# Patient Record
Sex: Male | Born: 1935
Health system: Southern US, Community
[De-identification: ages and names within clinical notes are randomized; demographics above are authoritative.]

## PROBLEM LIST (undated history)

## (undated) DIAGNOSIS — M1990 Unspecified osteoarthritis, unspecified site: Secondary | ICD-10-CM

## (undated) DIAGNOSIS — J984 Other disorders of lung: Secondary | ICD-10-CM

## (undated) DIAGNOSIS — I10 Essential (primary) hypertension: Secondary | ICD-10-CM

## (undated) DIAGNOSIS — R972 Elevated prostate specific antigen [PSA]: Secondary | ICD-10-CM

## (undated) DIAGNOSIS — I219 Acute myocardial infarction, unspecified: Secondary | ICD-10-CM

## (undated) DIAGNOSIS — I480 Paroxysmal atrial fibrillation: Secondary | ICD-10-CM

## (undated) DIAGNOSIS — I77819 Aortic ectasia, unspecified site: Secondary | ICD-10-CM

## (undated) DIAGNOSIS — Z87442 Personal history of urinary calculi: Secondary | ICD-10-CM

## (undated) DIAGNOSIS — I2510 Atherosclerotic heart disease of native coronary artery without angina pectoris: Secondary | ICD-10-CM

## (undated) DIAGNOSIS — J986 Disorders of diaphragm: Secondary | ICD-10-CM

## (undated) DIAGNOSIS — N210 Calculus in bladder: Secondary | ICD-10-CM

## (undated) DIAGNOSIS — M545 Low back pain, unspecified: Secondary | ICD-10-CM

## (undated) DIAGNOSIS — Z950 Presence of cardiac pacemaker: Secondary | ICD-10-CM

## (undated) DIAGNOSIS — I4729 Other ventricular tachycardia: Secondary | ICD-10-CM

## (undated) DIAGNOSIS — G8929 Other chronic pain: Secondary | ICD-10-CM

## (undated) DIAGNOSIS — I2699 Other pulmonary embolism without acute cor pulmonale: Secondary | ICD-10-CM

## (undated) DIAGNOSIS — Z8739 Personal history of other diseases of the musculoskeletal system and connective tissue: Secondary | ICD-10-CM

## (undated) DIAGNOSIS — Z86711 Personal history of pulmonary embolism: Secondary | ICD-10-CM

## (undated) DIAGNOSIS — N390 Urinary tract infection, site not specified: Secondary | ICD-10-CM

## (undated) DIAGNOSIS — N184 Chronic kidney disease, stage 4 (severe): Secondary | ICD-10-CM

## (undated) DIAGNOSIS — I272 Pulmonary hypertension, unspecified: Secondary | ICD-10-CM

## (undated) DIAGNOSIS — E785 Hyperlipidemia, unspecified: Secondary | ICD-10-CM

## (undated) DIAGNOSIS — I82409 Acute embolism and thrombosis of unspecified deep veins of unspecified lower extremity: Secondary | ICD-10-CM

## (undated) HISTORY — DX: Elevated prostate specific antigen (PSA): R97.20

## (undated) HISTORY — PX: KNEE ARTHROSCOPY: SHX127

## (undated) HISTORY — PX: JOINT REPLACEMENT: SHX530

## (undated) HISTORY — DX: Other pulmonary embolism without acute cor pulmonale: I26.99

## (undated) HISTORY — PX: CYSTOSCOPY W/ STONE MANIPULATION: SHX1427

## (undated) HISTORY — PX: TONSILLECTOMY: SUR1361

## (undated) HISTORY — PX: CATARACT EXTRACTION W/ INTRAOCULAR LENS  IMPLANT, BILATERAL: SHX1307

## (undated) HISTORY — DX: Other disorders of lung: J98.4

## (undated) HISTORY — DX: Essential (primary) hypertension: I10

## (undated) HISTORY — DX: Acute embolism and thrombosis of unspecified deep veins of unspecified lower extremity: I82.409

## (undated) HISTORY — PX: LITHOTRIPSY: SUR834

## (undated) HISTORY — DX: Disorders of diaphragm: J98.6

## (undated) HISTORY — DX: Pulmonary hypertension, unspecified: I27.20

## (undated) HISTORY — DX: Paroxysmal atrial fibrillation: I48.0

## (undated) HISTORY — PX: LAPAROSCOPIC CHOLECYSTECTOMY: SUR755

## (undated) HISTORY — DX: Hyperlipidemia, unspecified: E78.5

## (undated) HISTORY — DX: Other ventricular tachycardia: I47.29

## (undated) HISTORY — DX: Presence of cardiac pacemaker: Z95.0

## (undated) HISTORY — DX: Aortic ectasia, unspecified site: I77.819

---
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
## 1997-12-02 ENCOUNTER — Ambulatory Visit (HOSPITAL_COMMUNITY): Admission: RE | Admit: 1997-12-02 | Discharge: 1997-12-03 | Payer: Self-pay | Admitting: *Deleted

## 2000-03-04 ENCOUNTER — Encounter: Admission: RE | Admit: 2000-03-04 | Discharge: 2000-04-24 | Payer: Self-pay | Admitting: *Deleted

## 2000-07-24 ENCOUNTER — Encounter: Admission: RE | Admit: 2000-07-24 | Discharge: 2000-07-24 | Payer: Self-pay | Admitting: Specialist

## 2000-07-24 ENCOUNTER — Encounter: Payer: Self-pay | Admitting: Specialist

## 2000-08-13 LAB — HM COLONOSCOPY

## 2002-05-06 ENCOUNTER — Encounter
Admission: RE | Admit: 2002-05-06 | Discharge: 2002-05-06 | Payer: Self-pay | Admitting: Physical Medicine & Rehabilitation

## 2002-05-06 ENCOUNTER — Encounter: Payer: Self-pay | Admitting: Physical Medicine & Rehabilitation

## 2002-05-27 ENCOUNTER — Encounter: Payer: Self-pay | Admitting: Physical Medicine & Rehabilitation

## 2002-05-27 ENCOUNTER — Encounter
Admission: RE | Admit: 2002-05-27 | Discharge: 2002-05-27 | Payer: Self-pay | Admitting: Physical Medicine & Rehabilitation

## 2002-12-29 ENCOUNTER — Encounter: Admission: RE | Admit: 2002-12-29 | Discharge: 2002-12-29 | Payer: Self-pay | Admitting: Urology

## 2002-12-29 ENCOUNTER — Encounter: Payer: Self-pay | Admitting: Urology

## 2003-01-04 ENCOUNTER — Encounter: Payer: Self-pay | Admitting: Urology

## 2003-01-04 ENCOUNTER — Ambulatory Visit (HOSPITAL_BASED_OUTPATIENT_CLINIC_OR_DEPARTMENT_OTHER): Admission: RE | Admit: 2003-01-04 | Discharge: 2003-01-04 | Payer: Self-pay | Admitting: Urology

## 2003-01-14 ENCOUNTER — Ambulatory Visit (HOSPITAL_BASED_OUTPATIENT_CLINIC_OR_DEPARTMENT_OTHER): Admission: RE | Admit: 2003-01-14 | Discharge: 2003-01-14 | Payer: Self-pay | Admitting: Urology

## 2003-01-14 ENCOUNTER — Encounter: Payer: Self-pay | Admitting: Urology

## 2003-08-14 ENCOUNTER — Encounter: Payer: Self-pay | Admitting: Internal Medicine

## 2003-08-14 LAB — CONVERTED CEMR LAB

## 2004-07-25 ENCOUNTER — Ambulatory Visit: Payer: Self-pay | Admitting: Internal Medicine

## 2004-08-16 ENCOUNTER — Ambulatory Visit: Payer: Self-pay | Admitting: Internal Medicine

## 2004-08-22 ENCOUNTER — Ambulatory Visit: Payer: Self-pay | Admitting: Internal Medicine

## 2005-06-28 ENCOUNTER — Ambulatory Visit: Payer: Self-pay | Admitting: Internal Medicine

## 2005-08-16 ENCOUNTER — Ambulatory Visit: Payer: Self-pay | Admitting: Internal Medicine

## 2005-10-03 ENCOUNTER — Ambulatory Visit: Payer: Self-pay | Admitting: Internal Medicine

## 2005-10-17 ENCOUNTER — Ambulatory Visit: Payer: Self-pay | Admitting: Internal Medicine

## 2005-11-09 ENCOUNTER — Ambulatory Visit: Payer: Self-pay | Admitting: Internal Medicine

## 2006-04-24 ENCOUNTER — Ambulatory Visit: Payer: Self-pay | Admitting: Internal Medicine

## 2006-05-29 ENCOUNTER — Ambulatory Visit: Payer: Self-pay | Admitting: Internal Medicine

## 2007-03-17 ENCOUNTER — Ambulatory Visit: Payer: Self-pay | Admitting: Internal Medicine

## 2007-05-08 ENCOUNTER — Encounter: Payer: Self-pay | Admitting: Internal Medicine

## 2007-05-08 DIAGNOSIS — I10 Essential (primary) hypertension: Secondary | ICD-10-CM | POA: Insufficient documentation

## 2007-05-08 DIAGNOSIS — E785 Hyperlipidemia, unspecified: Secondary | ICD-10-CM | POA: Insufficient documentation

## 2007-06-17 ENCOUNTER — Ambulatory Visit: Payer: Self-pay | Admitting: Internal Medicine

## 2007-06-18 ENCOUNTER — Telehealth: Payer: Self-pay | Admitting: Internal Medicine

## 2007-06-19 ENCOUNTER — Ambulatory Visit: Payer: Self-pay | Admitting: Internal Medicine

## 2007-06-19 LAB — CONVERTED CEMR LAB
ALT: 40 units/L (ref 0–53)
AST: 29 units/L (ref 0–37)
Albumin: 3.4 g/dL — ABNORMAL LOW (ref 3.5–5.2)
Alkaline Phosphatase: 64 units/L (ref 39–117)
Basophils Absolute: 0 10*3/uL (ref 0.0–0.1)
Calcium: 9.1 mg/dL (ref 8.4–10.5)
Chloride: 99 meq/L (ref 96–112)
Creatinine, Ser: 1.8 mg/dL — ABNORMAL HIGH (ref 0.4–1.5)
Eosinophils Absolute: 0.1 10*3/uL (ref 0.0–0.6)
GFR calc non Af Amer: 40 mL/min
HCT: 51.7 % (ref 39.0–52.0)
MCHC: 34.4 g/dL (ref 30.0–36.0)
MCV: 92.7 fL (ref 78.0–100.0)
Platelets: 179 10*3/uL (ref 150–400)
RBC: 5.58 M/uL (ref 4.22–5.81)
RDW: 12.1 % (ref 11.5–14.6)
Total Bilirubin: 1.2 mg/dL (ref 0.3–1.2)
WBC: 11.7 10*3/uL — ABNORMAL HIGH (ref 4.5–10.5)

## 2007-06-25 ENCOUNTER — Ambulatory Visit: Payer: Self-pay | Admitting: Internal Medicine

## 2007-07-01 ENCOUNTER — Ambulatory Visit: Payer: Self-pay | Admitting: Internal Medicine

## 2007-09-17 ENCOUNTER — Telehealth: Payer: Self-pay | Admitting: Internal Medicine

## 2007-10-06 ENCOUNTER — Encounter: Payer: Self-pay | Admitting: Internal Medicine

## 2007-10-15 ENCOUNTER — Ambulatory Visit: Payer: Self-pay | Admitting: Internal Medicine

## 2007-10-15 LAB — CONVERTED CEMR LAB
Blood in Urine, dipstick: NEGATIVE
Nitrite: NEGATIVE
Protein, U semiquant: NEGATIVE
Urobilinogen, UA: 0.2
WBC Urine, dipstick: NEGATIVE
pH: 5

## 2007-10-16 LAB — CONVERTED CEMR LAB
ALT: 22 units/L (ref 0–53)
AST: 23 units/L (ref 0–37)
Albumin: 4.2 g/dL (ref 3.5–5.2)
Basophils Absolute: 0 10*3/uL (ref 0.0–0.1)
Calcium: 10 mg/dL (ref 8.4–10.5)
Chloride: 106 meq/L (ref 96–112)
Eosinophils Absolute: 0.3 10*3/uL (ref 0.0–0.6)
Eosinophils Relative: 5.2 % — ABNORMAL HIGH (ref 0.0–5.0)
GFR calc non Af Amer: 46 mL/min
Glucose, Bld: 91 mg/dL (ref 70–99)
LDL Cholesterol: 95 mg/dL (ref 0–99)
MCV: 90.1 fL (ref 78.0–100.0)
Platelets: 178 10*3/uL (ref 150–400)
RBC: 5.18 M/uL (ref 4.22–5.81)
RDW: 12.1 % (ref 11.5–14.6)
Total CHOL/HDL Ratio: 4.4
Triglycerides: 99 mg/dL (ref 0–149)
VLDL: 20 mg/dL (ref 0–40)
WBC: 6.6 10*3/uL (ref 4.5–10.5)

## 2007-10-22 ENCOUNTER — Ambulatory Visit: Payer: Self-pay | Admitting: Internal Medicine

## 2008-04-30 ENCOUNTER — Encounter: Payer: Self-pay | Admitting: Internal Medicine

## 2008-11-25 ENCOUNTER — Ambulatory Visit: Payer: Self-pay | Admitting: Internal Medicine

## 2008-11-25 LAB — CONVERTED CEMR LAB
ALT: 25 units/L (ref 0–53)
BUN: 35 mg/dL — ABNORMAL HIGH (ref 6–23)
Basophils Absolute: 0 10*3/uL (ref 0.0–0.1)
Bilirubin Urine: NEGATIVE
Bilirubin, Direct: 0.1 mg/dL (ref 0.0–0.3)
Cholesterol: 178 mg/dL (ref 0–200)
Creatinine, Ser: 1.7 mg/dL — ABNORMAL HIGH (ref 0.4–1.5)
Eosinophils Relative: 6.3 % — ABNORMAL HIGH (ref 0.0–5.0)
GFR calc non Af Amer: 42.2 mL/min (ref >60)
Ketones, urine, test strip: NEGATIVE
LDL Cholesterol: 105 mg/dL — ABNORMAL HIGH (ref 0–99)
MCV: 93.5 fL (ref 78.0–100.0)
Monocytes Absolute: 0.6 10*3/uL (ref 0.1–1.0)
Monocytes Relative: 9.4 % (ref 3.0–12.0)
Neutrophils Relative %: 55.4 % (ref 43.0–77.0)
PSA: 3.23 ng/mL (ref 0.10–4.00)
Platelets: 163 10*3/uL (ref 150.0–400.0)
Total Bilirubin: 1 mg/dL (ref 0.3–1.2)
Triglycerides: 63 mg/dL (ref 0.0–149.0)
Urobilinogen, UA: 0.2
VLDL: 12.6 mg/dL (ref 0.0–40.0)
WBC: 6.4 10*3/uL (ref 4.5–10.5)

## 2008-11-26 ENCOUNTER — Encounter: Payer: Self-pay | Admitting: Internal Medicine

## 2008-11-29 ENCOUNTER — Telehealth (INDEPENDENT_AMBULATORY_CARE_PROVIDER_SITE_OTHER): Payer: Self-pay | Admitting: *Deleted

## 2008-12-02 ENCOUNTER — Ambulatory Visit: Payer: Self-pay | Admitting: Internal Medicine

## 2008-12-02 DIAGNOSIS — Z87442 Personal history of urinary calculi: Secondary | ICD-10-CM

## 2008-12-02 DIAGNOSIS — N209 Urinary calculus, unspecified: Secondary | ICD-10-CM

## 2008-12-02 HISTORY — DX: Personal history of urinary calculi: Z87.442

## 2008-12-03 ENCOUNTER — Encounter: Payer: Self-pay | Admitting: Internal Medicine

## 2008-12-16 ENCOUNTER — Encounter: Payer: Self-pay | Admitting: Internal Medicine

## 2008-12-29 ENCOUNTER — Ambulatory Visit (HOSPITAL_COMMUNITY): Admission: RE | Admit: 2008-12-29 | Discharge: 2008-12-29 | Payer: Self-pay | Admitting: Urology

## 2009-05-19 ENCOUNTER — Encounter: Payer: Self-pay | Admitting: Internal Medicine

## 2009-07-29 ENCOUNTER — Encounter: Payer: Self-pay | Admitting: Internal Medicine

## 2009-08-16 ENCOUNTER — Telehealth: Payer: Self-pay | Admitting: Internal Medicine

## 2009-09-28 ENCOUNTER — Encounter: Payer: Self-pay | Admitting: Internal Medicine

## 2009-10-14 ENCOUNTER — Encounter: Payer: Self-pay | Admitting: Internal Medicine

## 2009-11-22 ENCOUNTER — Ambulatory Visit: Payer: Self-pay | Admitting: Internal Medicine

## 2009-11-22 LAB — CONVERTED CEMR LAB
Bilirubin Urine: NEGATIVE
Blood in Urine, dipstick: NEGATIVE
Glucose, Urine, Semiquant: NEGATIVE
Ketones, urine, test strip: NEGATIVE
Nitrite: NEGATIVE

## 2009-11-25 LAB — CONVERTED CEMR LAB
Albumin: 4 g/dL (ref 3.5–5.2)
Alkaline Phosphatase: 71 units/L (ref 39–117)
BUN: 36 mg/dL — ABNORMAL HIGH (ref 6–23)
Basophils Absolute: 0 10*3/uL (ref 0.0–0.1)
Bilirubin, Direct: 0 mg/dL (ref 0.0–0.3)
CO2: 29 meq/L (ref 19–32)
Calcium: 9.4 mg/dL (ref 8.4–10.5)
Cholesterol: 195 mg/dL (ref 0–200)
Creatinine, Ser: 1.8 mg/dL — ABNORMAL HIGH (ref 0.4–1.5)
Eosinophils Absolute: 0.5 10*3/uL (ref 0.0–0.7)
Glucose, Bld: 93 mg/dL (ref 70–99)
HDL: 61.1 mg/dL (ref >39.00)
Lymphocytes Relative: 24.4 % (ref 12.0–46.0)
MCHC: 34.2 g/dL (ref 30.0–36.0)
MCV: 93.9 fL (ref 78.0–100.0)
Monocytes Absolute: 0.8 10*3/uL (ref 0.1–1.0)
Neutrophils Relative %: 58.7 % (ref 43.0–77.0)
PSA: 6.12 ng/mL — ABNORMAL HIGH (ref 0.10–4.00)
RDW: 13 % (ref 11.5–14.6)
Triglycerides: 121 mg/dL (ref 0.0–149.0)

## 2009-12-05 ENCOUNTER — Ambulatory Visit: Payer: Self-pay | Admitting: Internal Medicine

## 2009-12-05 DIAGNOSIS — R972 Elevated prostate specific antigen [PSA]: Secondary | ICD-10-CM

## 2009-12-05 DIAGNOSIS — N400 Enlarged prostate without lower urinary tract symptoms: Secondary | ICD-10-CM | POA: Insufficient documentation

## 2009-12-05 HISTORY — DX: Elevated prostate specific antigen (PSA): R97.20

## 2009-12-28 ENCOUNTER — Encounter: Payer: Self-pay | Admitting: Internal Medicine

## 2010-01-05 ENCOUNTER — Encounter: Payer: Self-pay | Admitting: Internal Medicine

## 2010-03-10 ENCOUNTER — Ambulatory Visit: Payer: Self-pay | Admitting: Internal Medicine

## 2010-03-10 LAB — CONVERTED CEMR LAB: PSA: 4.08 ng/mL — ABNORMAL HIGH (ref 0.10–4.00)

## 2010-04-21 ENCOUNTER — Encounter: Payer: Self-pay | Admitting: Internal Medicine

## 2010-06-22 ENCOUNTER — Encounter: Payer: Self-pay | Admitting: Internal Medicine

## 2010-09-03 ENCOUNTER — Encounter: Payer: Self-pay | Admitting: Sports Medicine

## 2010-09-10 LAB — CONVERTED CEMR LAB
PSA, Free: 1.1 ng/mL
PSA: 3.71 ng/mL (ref 0.10–4.00)

## 2010-09-14 NOTE — Letter (Signed)
Summary: [facility]  [facility]   Imported By: Laural Benes 01/03/2010 11:22:08  _____________________________________________________________________  External Attachment:    Type:   Image     Comment:   External Document

## 2010-09-14 NOTE — Letter (Signed)
Summary: [facility]  [facility]   Imported By: Laural Benes 10/25/2009 11:14:58  _____________________________________________________________________  External Attachment:    Type:   Image     Comment:   External Document

## 2010-09-14 NOTE — Letter (Signed)
Summary: [facility]  [facility]   Imported By: Laural Benes 01/25/2010 15:03:47  _____________________________________________________________________  External Attachment:    Type:   Image     Comment:   External Document

## 2010-09-14 NOTE — Progress Notes (Signed)
Summary: refills  Phone Note Refill Request Message from:  Fax from Pharmacy on August 16, 2009 9:18 AM  Refills Requested: Medication #1:  SIMVASTATIN 80 MG TABS Take 1 tablet by mouth at bedtime  Medication #2:  HYDROCHLOROTHIAZIDE 25 MG TABS Take 1 tablet by mouth every morning NEEDS OFFICE VISIT FOR ADDITIONAL REFILLS  Method Requested: Electronic Initial call taken by: Westley Hummer CMA Deborra Medina),  August 16, 2009 9:18 AM    Prescriptions: SIMVASTATIN 80 MG TABS (SIMVASTATIN) Take 1 tablet by mouth at bedtime  #90 x 3   Entered by:   Westley Hummer CMA ([place])   Authorized by:   Phoebe Sharps MD   Signed by:   Westley Hummer CMA ([place]) on 08/16/2009   Method used:   Faxed to ...       [facility]  [address]       Ph: [phone]       Fax: [phone]   RxID:   [id number] HYDROCHLOROTHIAZIDE 25 MG TABS (HYDROCHLOROTHIAZIDE) Take 1 tablet by mouth every morning NEEDS OFFICE VISIT FOR ADDITIONAL REFILLS  #30 Tablet x 0   Entered by:   Westley Hummer CMA ([place])   Authorized by:   Phoebe Sharps MD   Signed by:   Westley Hummer CMA ([place]) on 08/16/2009   Method used:   Faxed to ...       [facility])       [place]  [address]       Ph: [phone]       Fax: [phone]   RxID:   [id number]

## 2010-09-14 NOTE — Assessment & Plan Note (Signed)
Summary: CPX // RS   Vital Signs:  Patient profile:   77 year old male Height:      72.5 inches Weight:      264 pounds BMI:     35.44 Pulse rate:   58 / minute Pulse rhythm:   skips Resp:     12 per minute BP sitting:   120 / 78  (left arm) Cuff size:   regular  Vitals Entered By: Rica Records, RN (December 05, 2009 9:03 AM) CC: cpx, labs done--completed antibiotic this AM--c/o right hip pain Is Patient Diabetic? No   CC:  cpx and labs done--completed antibiotic this AM--c/o right hip pain.  History of Present Illness: Here for Medicare AWV:  1.   Risk factors based on Past M, S, F history: see list 2.   Physical Activities: --able to be active except limited by right hip pain 3.   Depression/mood: --denies 4.   Hearing:  5.   ADL's: --no complaints 6.   Fall Risk: --none noted 7.   Home Safety: - no concerns 8.   Height, weight, &visual acuity: see exam , no visual difficulties noted 9.   Counseling: none necessary 10.   Labs ordered based on risk factors: see orders 11.           Referral Coordination: none necessary 12.           Care Plan: daily exercise 13.            Cognitive Assessment---no concerns noted  Hip pain: ongoing for 1 month---increases with act of standing  lipids: tolerating meds  BPH---doing well on flomax   Preventive Screening-Counseling & Management  Alcohol-Tobacco     Smoking Status: never  Current Medications (verified): 1)  Hydrochlorothiazide 25 Mg Tabs (Hydrochlorothiazide) .... Take 1 Tablet By Mouth Every Morning 2)  Naproxen 500 Mg Tabs (Naproxen) .... Take 1 Tablet By Mouth Twice A Day 3)  Simvastatin 80 Mg Tabs (Simvastatin) .... Take 1 Tablet By Mouth At Bedtime 4)  Aspirin 81 Mg  Tbec (Aspirin) .... Qd 5)  Cialis 20 Mg Tabs (Tadalafil) .... One Daily Three Times A Week As Needed 6)  Flomax 0.4 Mg Xr24h-Cap (Tamsulosin Hcl) .... One By Mouth Daily 7)  Dulcolax Stool Softener 100 Mg Caps (Docusate Sodium)  Allergies: 1)  !  Lisinopril (Lisinopril) 2)  Penicillin G Potassium (Penicillin G Potassium)  Past History:  Past Medical History: Hyperlipidemia Hypertension  Review of Systems       All other systems reviewed and were negative   Physical Exam  General:  alert and well-developed.   Head:  normocephalic and atraumatic.   Eyes:  pupils equal and pupils round.   Ears:  R ear normal and L ear normal.   Neck:  No deformities, masses, or tenderness noted. Chest Wall:  No deformities, masses, tenderness or gynecomastia noted. Lungs:  Normal respiratory effort, chest expands symmetrically. Lungs are clear to auscultation, no crackles or wheezes. Heart:  normal rate and regular rhythm.   Abdomen:  obese soft, nontender, active bowel sounds.  No distention Msk:  No deformity or scoliosis noted of thoracic or lumbar spine.   Extremities:  1+ left pedal edema.  no edema right.  Neurologic:  cranial nerves II-XII intact and gait normal.   Skin:  turgor normal and color normal.   Cervical Nodes:  no anterior cervical adenopathy and no posterior cervical adenopathy.   Axillary Nodes:  no R axillary adenopathy and no L  axillary adenopathy.     Impression & Recommendations:  Problem # 1:  PREVENTIVE HEALTH CARE (ICD-V70.0)  health maint utd  Orders: First annual wellness visit with prevention plan  [phone])  Complete Medication List: 1)  Hydrochlorothiazide 25 Mg Tabs (Hydrochlorothiazide) .... Take 1 tablet by mouth every morning 2)  Naproxen 500 Mg Tabs (Naproxen) .... Take 1 tablet by mouth twice a day 3)  Simvastatin 80 Mg Tabs (Simvastatin) .... Take 1 tablet by mouth at bedtime 4)  Aspirin 81 Mg Tbec (Aspirin) .... Qd 5)  Cialis 20 Mg Tabs (Tadalafil) .... One daily three times a week as needed 6)  Flomax 0.4 Mg Xr24h-cap (Tamsulosin hcl) .... One by mouth daily 7)  Dulcolax Stool Softener 100 Mg Caps (Docusate sodium)  Other Orders: TD Toxoids IM 7 YR + [id number]) Admin 1st Vaccine  [id number]) T-PSA Free ([id number]) TLB-PSA (Prostate Specific Antigen) (84153-PSA)   Immunizations Administered:  Tetanus Vaccine:    Vaccine Type: Td    Site: left deltoid    Mfr: Sanofi Pasteur    Dose: 0.5 ml    Route: IM    Given by: Rica Records, RN    Exp. Date: 06/28/2011    Lot #: [id number]    Appended Document: CPX // RS   Appended Document: Orders Update    Clinical Lists Changes  Orders: Added new Service order of Venipuncture [phone]) - Signed Added new Test order of T-PSA Total ([phone]) - Signed

## 2010-09-14 NOTE — Letter (Signed)
Summary: [facility]  [facility]   Imported By: Laural Benes 05/23/2010 12:56:31  _____________________________________________________________________  External Attachment:    Type:   Image     Comment:   External Document

## 2010-09-14 NOTE — Letter (Signed)
Summary: [facility]  [facility]   Imported By: Laural Benes 08/25/2009 12:32:02  _____________________________________________________________________  External Attachment:    Type:   Image     Comment:   External Document

## 2010-09-14 NOTE — Miscellaneous (Signed)
Summary: Immunization Entry   Immunization History:  Influenza Immunization History:    Influenza:  historical (06/21/2010)

## 2010-09-14 NOTE — Letter (Signed)
Summary: [facility]  [facility]   Imported By: Laural Benes 10/07/2009 15:43:23  _____________________________________________________________________  External Attachment:    Type:   Image     Comment:   External Document

## 2010-09-20 ENCOUNTER — Other Ambulatory Visit: Payer: Self-pay | Admitting: Sports Medicine

## 2010-09-20 DIAGNOSIS — M545 Low back pain: Secondary | ICD-10-CM

## 2010-09-24 ENCOUNTER — Ambulatory Visit
Admission: RE | Admit: 2010-09-24 | Discharge: 2010-09-24 | Disposition: A | Discharge status: Home / Self Care | Source: Ambulatory Visit | Attending: Sports Medicine | Admitting: Sports Medicine

## 2010-09-24 DIAGNOSIS — M545 Low back pain: Secondary | ICD-10-CM

## 2010-11-20 ENCOUNTER — Other Ambulatory Visit: Payer: Self-pay | Admitting: *Deleted

## 2010-11-20 DIAGNOSIS — E785 Hyperlipidemia, unspecified: Secondary | ICD-10-CM

## 2010-11-20 MED ORDER — SIMVASTATIN 80 MG PO TABS: 80.0000 mg | ORAL_TABLET | Freq: Every day | ORAL | Status: DC

## 2010-11-21 LAB — BASIC METABOLIC PANEL
Calcium: 9.8 mg/dL (ref 8.4–10.5)
Chloride: 105 mEq/L (ref 96–112)
Creatinine, Ser: 1.5 mg/dL (ref 0.4–1.5)
GFR calc Af Amer: 56 mL/min — ABNORMAL LOW (ref >60)
Sodium: 142 mEq/L (ref 135–145)

## 2010-11-21 LAB — HEMOGLOBIN AND HEMATOCRIT, BLOOD
HCT: 44.8 % (ref 39.0–52.0)
Hemoglobin: 15.4 g/dL (ref 13.0–17.0)

## 2010-11-28 ENCOUNTER — Telehealth: Payer: Self-pay | Admitting: *Deleted

## 2010-11-28 NOTE — Telephone Encounter (Signed)
Pt is at [facility], and family called for list of meds. Called back and list given to nurse.

## 2010-12-06 ENCOUNTER — Other Ambulatory Visit

## 2010-12-12 ENCOUNTER — Encounter: Payer: Self-pay | Admitting: Internal Medicine

## 2010-12-13 ENCOUNTER — Ambulatory Visit (INDEPENDENT_AMBULATORY_CARE_PROVIDER_SITE_OTHER): Payer: Medicare Other | Admitting: Internal Medicine

## 2010-12-13 ENCOUNTER — Encounter: Payer: Self-pay | Admitting: Internal Medicine

## 2010-12-13 VITALS — BP 114/72 | HR 68 | Temp 98.5°F | Ht 75.0 in | Wt 262.0 lb

## 2010-12-13 DIAGNOSIS — E785 Hyperlipidemia, unspecified: Secondary | ICD-10-CM

## 2010-12-13 DIAGNOSIS — M109 Gout, unspecified: Secondary | ICD-10-CM

## 2010-12-13 DIAGNOSIS — N209 Urinary calculus, unspecified: Secondary | ICD-10-CM

## 2010-12-13 MED ORDER — PREDNISONE (PAK) 10 MG PO TABS: ORAL_TABLET | ORAL | Status: DC

## 2010-12-13 MED ORDER — ALLOPURINOL 100 MG PO TABS: 100.0000 mg | ORAL_TABLET | Freq: Every day | ORAL | Status: DC

## 2010-12-13 MED ORDER — SIMVASTATIN 80 MG PO TABS: 40.0000 mg | ORAL_TABLET | Freq: Every day | ORAL | Status: DC

## 2010-12-13 NOTE — Assessment & Plan Note (Signed)
Patient has recently been hospitalized with what sounds like an infected kidney stone. He required a stent. It has been removed. He is feeling better. He tells me that he has no residual kidney stone. He has been asked to start allopurinol. He has also been asked to avoid nostril anti-inflammatory drugs. I put that in as an allergy for his medications. He will schedule followup with Dr. Risa Grill.

## 2010-12-13 NOTE — Progress Notes (Signed)
  Subjective:    Patient ID: [name], male    DOB: 1934-06-08, 77 y.o.   MRN: [id number]  HPI  F/u  Went to ED at [facility] and required hospitalization for infected kidney stone. Stent for 1-2 weeks.  He has completed abx. He tells me that kidney function diminished  Also had gout---treated steroids and given rx for allopurinol---not started yet  Lipids---tolerating meds  Past Medical History  Diagnosis Date  . Hyperlipidemia   . Hypertension   . PSA, INCREASED 12/05/2009  . KIDNEY STONE 12/02/2008   Past Surgical History  Procedure Date  . Cholecystectomy     reports that he has never smoked. He does not have any smokeless tobacco history on file. He reports that he does not drink alcohol or use illicit drugs. family history includes Heart attack in his father. Allergies  Allergen Reactions  . Lisinopril     REACTION: cough  . Penicillins     REACTION: hives     Review of Systems  patient denies chest pain, shortness of breath, orthopnea. Denies lower extremity edema, abdominal pain,. Patient denies rashes, musculoskeletal complaints. No other specific complaints in a complete review of systems except admits to decreased appetite and some constipation, also admit to poor energy level.      Objective:   Physical Exam  well-developed well-nourished male in no acute distress. HEENT exam atraumatic, normocephalic, neck supple without jugular venous distention. Chest clear to auscultation cardiac exam S1-S2 are regular. Abdominal exam overweight with bowel sounds, soft and nontender. Extremities no edema. Neurologic exam is alert with a normal gait.        Assessment & Plan:

## 2010-12-13 NOTE — Assessment & Plan Note (Signed)
Patient will start allopurinol. He'll take prednisone in a prophylactic manner to prevent gout attack. This was explained to the patient. Side effects discussed.

## 2010-12-21 ENCOUNTER — Encounter: Payer: Self-pay | Admitting: Internal Medicine

## 2010-12-26 NOTE — Op Note (Signed)
NAME:  [name], [name]                ACCOUNT NO.:  [id number]   MEDICAL RECORD NO.:  [id number]          PATIENT TYPE:  AMB   LOCATION:  DAY                          FACILITY:  [facility]   PHYSICIAN:  Bernestine Amass, M.D.  DATE OF BIRTH:  [birth year]   DATE OF PROCEDURE:  12/29/2008  DATE OF DISCHARGE:                               OPERATIVE REPORT   PREOPERATIVE DIAGNOSES:  1. Bladder calculus.  2. Possible ureteral calculus.   POSTOPERATIVE DIAGNOSES:  1. Bladder calculus.  2. No evidence of left ureteral calculus.   PROCEDURE PERFORMED:  1. Meatal dilation.  2. Cystoscopy,  3. Electrohydraulic lithotripsy of bladder stone.  4. Left retrograde pyelogram.   SURGEON:  Bernestine Amass, M.D.   ANESTHESIA:  General.   HISTORY AND INDICATIONS:  [name] is 77 years of age.  The patient  initially presented to our office approximately a month ago with a  diagnosis of a left ureteral calculus.  The patient had developed left-  sided renal colic and had several small bilateral renal calculi.  He  also was noted to have left mild hydronephrosis and a 3-mm mid-ureteral  calculus.  There was also a 7-8-mm stone noted in his bladder.  The  patient came back for follow-up.  He remained asymptomatic with a clear  urine.  On KUB we could not see any obvious ureteral stones but the  patient did have a bladder calculus noted.  The patient underwent  consultation with regard to treatment options.  We felt that the bladder  stone did require treatment.  We felt at the same time we could do a  left retrograde pyelogram to see if the stone were present and if it  was, to consider definitive therapy, since it has been a month.  The  patient appeared to understand the benefits and risks of this procedure  and full informed consent was obtained.   TECHNIQUE AND FINDINGS:  The patient had proper patient identification  performed.  He received perioperative ciprofloxacin.  In the operating  room, he had  successful induction of general anesthesia and was then  placed in lithotomy position.  Appropriate surgical time-out was  performed.  The patient was noted to have distal shaft hypospadias.  He  had some meatal stenosis and therefore underwent dilation to 24-French.  Cystoscopy was then performed.  Moderate trilobar hyperplasia with  lateral lobes that did meet in the midline.  Moderately high-riding  median bar.  The bladder itself showed approximately an 8-10-mm bladder  calculus that was a Barnabas Lister stone..   Left retrograde pyelogram was performed with fluoroscopic  interpretation.  There was no evidence of filling defect or obstruction  and the contrast flowed easily out of the collecting system on the left.  No evidence of continued presence of the left ureteral stone.  The  bladder calculus was fractured with the EHL probe.  The smallest pieces  were irrigated out.  The large piece that remained was basket extracted  with a nitinol basket.  Lidocaine jelly was instilled.  The patient  appeared to tolerate the procedure  well.  There were no obvious  complications and he was brought to recovery room in stable condition.      Bernestine Amass, M.D.  Electronically Signed     DSG/MEDQ  D:  12/29/2008  T:  12/29/2008  Job:  [id number]

## 2010-12-29 NOTE — Op Note (Signed)
NAME:  [name], [name]                          ACCOUNT NO.:  [id number]   MEDICAL RECORD NO.:  [id number]                   PATIENT TYPE:  AMB   LOCATION:  [facility]                                 FACILITY:  [facility]   PHYSICIAN:  Lillette Boxer. Dahlstedt, M.D.          DATE OF BIRTH:  February 24, 1934   DATE OF PROCEDURE:  DATE OF DISCHARGE:                                 OPERATIVE REPORT   PREOPERATIVE DIAGNOSIS:  Right ureteral stone status post lithotripsy with  inadequate fragmentation and passage of stones and persistent right flank  pain.   POSTOPERATIVE DIAGNOSIS:  Right ureteral stone status post lithotripsy with  inadequate fragmentation and passage of stones and persistent right flank  pain.   PROCEDURES:  1. Cystoscopy.  2. Right retrograde ureteropyelogram.  3. Right ureteroscopy with Homium laser lithotripsy of right ureteral stone.  4. Stone extraction.  5. Double J stent placement.  6. Interpretive fluoroscopy.   SURGEON:  Lillette Boxer. Dahlstedt, M.D.   ANESTHESIA:  General LMA.   COMPLICATIONS:  None.   BRIEF HISTORY:  The patient is a 77 year old gentleman who underwent  lithotripsy about a week ago.  I was unable to increase the power  significantly on him due to his requirement for significant amounts of pain  medications.  He was seen in the office this week. He still had a  significant amount of right flank pain and his stone was only in the  proximal to mid ureter.  He had been having a miserable time of it and it  required a fair amount of pain medication.  As the stone did not look that  fragmented and had not passed that far, I gave him the option of watchful  waiting with more pain medications versus ureteroscopy.  He has chosen the  latter of these two options.  He is aware of the risks and complications and  he desires to proceed.   DESCRIPTION OF PROCEDURE:  The patient was administered preoperative IV  antibiotics and taken to the operating room where a  general anesthetic was  administered.  He was placed in the dorsal lithotomy position.  The  genitalia and perineum were prepped and draped.  He had a hypospadiac meatus  which was dilated to 71 [place]. I then passed a 11 [place] panendoscope and  his urethra was normal. He had minimal hypertrophy of the lateral lobes of  the prostate.  The bladder was found to be normal. A right retrograde was  performed showing a kink and a filling defect in his proximal ureter on  the right.  I then advanced a glide wire by this using an in hole catheter  to guide it by this kinking.  This easily advanced then into the right  renal pelvis.  I then passed the ureteroscope up to the stone in the  proximal ureter.  It was evident that with some hydronephrosis the stone  would pass  back into the kidney. I then passed a stone-cone and the cone  coil was opened just proximal to the stone.  This enabled me to entrap the  stone for laser lithotripsy.  I used a Homium laser to fragment the stone in  multiple small pieces.  I then withdrew the stone cone with the coil open.  This brought the fragments down into the distal ureter.  Several fragments  were then removed with a Nitinol basket.  I felt that most of the fragments  had either been broken up into small little power or extracted.  I then  passed a guide wire and over this a 26 cm x 6 [place] double J stent was  placed using fluoroscopic and cystoscopic guidance.  Good curls were seen  proximally and distally after the guide wire was removed.  The string was  left on and was brought out through the urethra and taped to the penis.  The  bladder was drained.  The scope was then removed.  The patient tolerated the  procedure well. He was awakened and taken to the PACU in stable condition.                                               Lillette Boxer. Dahlstedt, M.D.    SMD/MEDQ  D:  01/14/2003  T:  01/14/2003  Job:  [id number]

## 2011-01-01 ENCOUNTER — Encounter: Payer: Self-pay | Admitting: Internal Medicine

## 2011-01-02 ENCOUNTER — Telehealth: Payer: Self-pay | Admitting: *Deleted

## 2011-01-02 NOTE — Telephone Encounter (Signed)
Pt wants to know what labs Dr. Leanne Chang wants before his June appt.

## 2011-01-02 NOTE — Telephone Encounter (Signed)
i'll just draw labs at the time of OV

## 2011-01-02 NOTE — Telephone Encounter (Signed)
Notified pt. 

## 2011-01-17 ENCOUNTER — Ambulatory Visit: Admitting: Internal Medicine

## 2011-01-25 ENCOUNTER — Ambulatory Visit (INDEPENDENT_AMBULATORY_CARE_PROVIDER_SITE_OTHER): Payer: Medicare Other | Admitting: Internal Medicine

## 2011-01-25 ENCOUNTER — Encounter: Payer: Self-pay | Admitting: Internal Medicine

## 2011-01-25 VITALS — BP 126/90 | HR 52 | Temp 98.0°F | Ht 75.0 in | Wt 266.0 lb

## 2011-01-25 DIAGNOSIS — R609 Edema, unspecified: Secondary | ICD-10-CM

## 2011-01-25 DIAGNOSIS — R600 Localized edema: Secondary | ICD-10-CM | POA: Insufficient documentation

## 2011-01-25 NOTE — Assessment & Plan Note (Signed)
Chronic problem Will check for venous insufficiency

## 2011-02-01 NOTE — Progress Notes (Signed)
  Subjective:    Patient ID: [name], male    DOB: 01-20-1934, 77 y.o.   MRN: [id number]  HPI  Patient Active Problem List  Diagnoses  . HYPERLIPIDEMIA-tolerating medications without difficulty.   [name] HYPERTENSION-currently not taking any medications.   .   .   . Gout-no recurrence.   . Edema leg-patient complains of swelling of the right leg. This is an ongoing complaint for several years.      Past Medical History  Diagnosis Date  . Hyperlipidemia   . Hypertension   . PSA, INCREASED 12/05/2009  . KIDNEY STONE 12/02/2008   Past Surgical History  Procedure Date  . Cholecystectomy     reports that he has never smoked. He does not have any smokeless tobacco history on file. He reports that he drinks alcohol. He reports that he does not use illicit drugs. family history includes Heart attack in his father. Allergies  Allergen Reactions  . Lisinopril     REACTION: cough  . Penicillins     REACTION: hives     Review of Systems    patient denies chest pain, shortness of breath, orthopnea. Denies lower extremity edema, abdominal pain, change in appetite, change in bowel movements. Patient denies rashes, musculoskeletal complaints. No other specific complaints in a complete review of systems.    Objective:   Physical Exam Well-developed male in no acute distress. HEENT exam atraumatic, normocephalic neck supple without tetanus dissection. Abdominal exam overweight current medicines. Extremities trace to 1+ edema right leg to midcalf       Assessment & Plan:

## 2011-02-09 ENCOUNTER — Encounter: Payer: Medicare Other | Admitting: *Deleted

## 2011-02-09 ENCOUNTER — Other Ambulatory Visit: Payer: Self-pay | Admitting: Internal Medicine

## 2011-02-09 DIAGNOSIS — R609 Edema, unspecified: Secondary | ICD-10-CM

## 2011-02-09 DIAGNOSIS — M79604 Pain in right leg: Secondary | ICD-10-CM

## 2011-02-12 ENCOUNTER — Ambulatory Visit
Admission: RE | Admit: 2011-02-12 | Discharge: 2011-02-12 | Disposition: A | Discharge status: Home / Self Care | Payer: Medicare Other | Source: Ambulatory Visit | Attending: Internal Medicine | Admitting: Internal Medicine

## 2011-02-12 ENCOUNTER — Encounter: Payer: Medicare Other | Admitting: Cardiology

## 2011-02-12 DIAGNOSIS — R609 Edema, unspecified: Secondary | ICD-10-CM

## 2011-02-12 DIAGNOSIS — M79604 Pain in right leg: Secondary | ICD-10-CM

## 2011-02-15 ENCOUNTER — Other Ambulatory Visit: Payer: Self-pay | Admitting: Internal Medicine

## 2011-02-15 DIAGNOSIS — I872 Venous insufficiency (chronic) (peripheral): Secondary | ICD-10-CM

## 2011-02-20 ENCOUNTER — Ambulatory Visit
Admission: RE | Admit: 2011-02-20 | Discharge: 2011-02-20 | Disposition: A | Discharge status: Home / Self Care | Payer: Medicare Other | Source: Ambulatory Visit | Attending: Internal Medicine | Admitting: Internal Medicine

## 2011-02-20 ENCOUNTER — Other Ambulatory Visit: Payer: Medicare Other

## 2011-02-20 ENCOUNTER — Other Ambulatory Visit: Payer: Self-pay | Admitting: Internal Medicine

## 2011-02-20 VITALS — BP 135/73 | HR 59 | Temp 98.0°F | Resp 14 | Ht 75.0 in | Wt 240.0 lb

## 2011-02-20 DIAGNOSIS — I872 Venous insufficiency (chronic) (peripheral): Secondary | ICD-10-CM

## 2011-05-03 ENCOUNTER — Other Ambulatory Visit: Payer: Self-pay | Admitting: Diagnostic Radiology

## 2011-05-03 DIAGNOSIS — R600 Localized edema: Secondary | ICD-10-CM

## 2011-05-22 ENCOUNTER — Ambulatory Visit (INDEPENDENT_AMBULATORY_CARE_PROVIDER_SITE_OTHER): Payer: Medicare Other | Admitting: Internal Medicine

## 2011-05-22 ENCOUNTER — Encounter: Payer: Self-pay | Admitting: Internal Medicine

## 2011-05-22 VITALS — BP 142/82 | HR 64 | Temp 98.0°F | Ht 75.0 in | Wt 278.0 lb

## 2011-05-22 DIAGNOSIS — I10 Essential (primary) hypertension: Secondary | ICD-10-CM

## 2011-05-22 MED ORDER — LOSARTAN POTASSIUM 50 MG PO TABS: 50.0000 mg | ORAL_TABLET | Freq: Every day | ORAL | Status: DC

## 2011-05-22 NOTE — Progress Notes (Signed)
  Subjective:    Patient ID: [name], male    DOB: 01-Apr-1934, 77 y.o.   MRN: [id number]  HPI  No recurrent kidney stones---sees dr Risa Grill  Lipids---tolerating meds  ?htn---no meds  Past Medical History  Diagnosis Date  . Hyperlipidemia   . Hypertension   . PSA, INCREASED 12/05/2009  . KIDNEY STONE 12/02/2008   Past Surgical History  Procedure Date  . Cholecystectomy     reports that he has never smoked. He has never used smokeless tobacco. He reports that he drinks alcohol. He reports that he does not use illicit drugs. family history includes Heart attack in his father. Allergies  Allergen Reactions  . Lisinopril     REACTION: cough  . Penicillins     REACTION: hives     Review of Systems  patient denies chest pain, shortness of breath, orthopnea. Denies lower extremity edema, abdominal pain, change in appetite, change in bowel movements. Patient denies rashes, musculoskeletal complaints. No other specific complaints in a complete review of systems.      Objective:   Physical Exam  well-developed well-nourished male in no acute distress. HEENT exam atraumatic, normocephalic, neck supple without jugular venous distention. Chest clear to auscultation cardiac exam S1-S2 are regular. Abdominal exam overweight with bowel sounds, soft and nontender. Extremities no edema. Neurologic exam is alert with a normal gait.        Assessment & Plan:

## 2011-05-22 NOTE — Assessment & Plan Note (Signed)
Needs treatment i reapeated bp: 150/85

## 2011-05-29 ENCOUNTER — Ambulatory Visit
Admission: RE | Admit: 2011-05-29 | Discharge: 2011-05-29 | Disposition: A | Discharge status: Home / Self Care | Payer: Medicare Other | Source: Ambulatory Visit | Attending: Diagnostic Radiology | Admitting: Diagnostic Radiology

## 2011-05-29 DIAGNOSIS — R600 Localized edema: Secondary | ICD-10-CM

## 2011-06-12 ENCOUNTER — Other Ambulatory Visit (INDEPENDENT_AMBULATORY_CARE_PROVIDER_SITE_OTHER): Payer: Medicare Other

## 2011-06-12 DIAGNOSIS — I10 Essential (primary) hypertension: Secondary | ICD-10-CM

## 2011-06-12 LAB — BASIC METABOLIC PANEL
BUN: 34 mg/dL — ABNORMAL HIGH (ref 6–23)
Chloride: 107 mEq/L (ref 96–112)
Potassium: 4.8 mEq/L (ref 3.5–5.1)
Sodium: 141 mEq/L (ref 135–145)

## 2011-06-19 ENCOUNTER — Other Ambulatory Visit: Payer: Self-pay | Admitting: Internal Medicine

## 2011-06-19 ENCOUNTER — Ambulatory Visit: Payer: Medicare Other | Admitting: *Deleted

## 2011-06-19 DIAGNOSIS — N289 Disorder of kidney and ureter, unspecified: Secondary | ICD-10-CM

## 2011-07-02 ENCOUNTER — Encounter: Payer: Medicare Other | Admitting: Internal Medicine

## 2011-07-15 NOTE — Progress Notes (Signed)
This encounter was created in error - please disregard.

## 2011-09-05 ENCOUNTER — Telehealth: Payer: Self-pay | Admitting: Internal Medicine

## 2011-09-05 DIAGNOSIS — I10 Essential (primary) hypertension: Secondary | ICD-10-CM

## 2011-09-05 MED ORDER — LOSARTAN POTASSIUM 50 MG PO TABS: 50.0000 mg | ORAL_TABLET | Freq: Every day | ORAL | Status: DC

## 2011-09-05 NOTE — Telephone Encounter (Signed)
Needs new rx for Losartan 100mg  1/2 qd. It was previously rx'd by the [place]. Please send to [facility]. Thanks.

## 2011-09-05 NOTE — Telephone Encounter (Signed)
rx sent in electronically 

## 2011-09-14 DIAGNOSIS — I129 Hypertensive chronic kidney disease with stage 1 through stage 4 chronic kidney disease, or unspecified chronic kidney disease: Secondary | ICD-10-CM | POA: Diagnosis not present

## 2011-09-14 DIAGNOSIS — N183 Chronic kidney disease, stage 3 unspecified: Secondary | ICD-10-CM | POA: Diagnosis not present

## 2011-09-14 DIAGNOSIS — D649 Anemia, unspecified: Secondary | ICD-10-CM | POA: Diagnosis not present

## 2011-09-14 DIAGNOSIS — E213 Hyperparathyroidism, unspecified: Secondary | ICD-10-CM | POA: Diagnosis not present

## 2011-09-14 DIAGNOSIS — N200 Calculus of kidney: Secondary | ICD-10-CM | POA: Diagnosis not present

## 2011-09-17 DIAGNOSIS — E213 Hyperparathyroidism, unspecified: Secondary | ICD-10-CM | POA: Diagnosis not present

## 2011-10-09 ENCOUNTER — Other Ambulatory Visit: Payer: Self-pay | Admitting: *Deleted

## 2011-10-09 MED ORDER — ALLOPURINOL 100 MG PO TABS: 100.0000 mg | ORAL_TABLET | Freq: Every day | ORAL | Status: DC

## 2011-10-29 DIAGNOSIS — E669 Obesity, unspecified: Secondary | ICD-10-CM | POA: Diagnosis not present

## 2011-10-29 DIAGNOSIS — I129 Hypertensive chronic kidney disease with stage 1 through stage 4 chronic kidney disease, or unspecified chronic kidney disease: Secondary | ICD-10-CM | POA: Diagnosis not present

## 2011-10-31 ENCOUNTER — Other Ambulatory Visit: Payer: Self-pay | Admitting: *Deleted

## 2011-10-31 DIAGNOSIS — E785 Hyperlipidemia, unspecified: Secondary | ICD-10-CM

## 2011-10-31 DIAGNOSIS — I10 Essential (primary) hypertension: Secondary | ICD-10-CM

## 2011-10-31 MED ORDER — LOSARTAN POTASSIUM 50 MG PO TABS: 50.0000 mg | ORAL_TABLET | Freq: Two times a day (BID) | ORAL | Status: DC

## 2011-10-31 MED ORDER — SIMVASTATIN 40 MG PO TABS: 40.0000 mg | ORAL_TABLET | Freq: Every day | ORAL | Status: DC

## 2011-11-21 DIAGNOSIS — N182 Chronic kidney disease, stage 2 (mild): Secondary | ICD-10-CM | POA: Diagnosis not present

## 2011-11-21 DIAGNOSIS — I120 Hypertensive chronic kidney disease with stage 5 chronic kidney disease or end stage renal disease: Secondary | ICD-10-CM | POA: Diagnosis not present

## 2011-12-05 DIAGNOSIS — L02619 Cutaneous abscess of unspecified foot: Secondary | ICD-10-CM | POA: Diagnosis not present

## 2011-12-05 DIAGNOSIS — M109 Gout, unspecified: Secondary | ICD-10-CM | POA: Diagnosis not present

## 2011-12-05 DIAGNOSIS — M779 Enthesopathy, unspecified: Secondary | ICD-10-CM | POA: Diagnosis not present

## 2011-12-10 DIAGNOSIS — R609 Edema, unspecified: Secondary | ICD-10-CM | POA: Diagnosis not present

## 2011-12-10 DIAGNOSIS — M109 Gout, unspecified: Secondary | ICD-10-CM | POA: Diagnosis not present

## 2011-12-13 DIAGNOSIS — M779 Enthesopathy, unspecified: Secondary | ICD-10-CM | POA: Diagnosis not present

## 2011-12-17 ENCOUNTER — Ambulatory Visit (INDEPENDENT_AMBULATORY_CARE_PROVIDER_SITE_OTHER): Payer: Medicare Other | Admitting: Internal Medicine

## 2011-12-17 ENCOUNTER — Encounter: Payer: Self-pay | Admitting: Internal Medicine

## 2011-12-17 DIAGNOSIS — M109 Gout, unspecified: Secondary | ICD-10-CM | POA: Diagnosis not present

## 2011-12-17 LAB — BASIC METABOLIC PANEL
BUN: 39 mg/dL — ABNORMAL HIGH (ref 6–23)
Calcium: 10 mg/dL (ref 8.4–10.5)
Chloride: 110 mEq/L (ref 96–112)
Creatinine, Ser: 1.8 mg/dL — ABNORMAL HIGH (ref 0.4–1.5)
GFR: 40.21 mL/min — ABNORMAL LOW (ref >60.00)

## 2011-12-17 LAB — URIC ACID: Uric Acid, Serum: 5.8 mg/dL (ref 4.0–7.8)

## 2011-12-17 MED ORDER — ALLOPURINOL 300 MG PO TABS: 300.0000 mg | ORAL_TABLET | Freq: Every day | ORAL | Status: DC

## 2011-12-17 MED ORDER — COLCHICINE 0.6 MG PO TABS: 0.6000 mg | ORAL_TABLET | Freq: Every day | ORAL | Status: DC

## 2011-12-17 NOTE — Progress Notes (Signed)
Patient ID: [name], male   DOB: [birth year], 78 y.o.   MRN: [id number] Gout flare-- has had 2 weeks of pain Has seen podiatry--treat with steroids p.o. And intraarticular---initial relief, now pain has recurred. Right MTP joint swelling and pain  Reviewed dr. Susa Raring note---NO NSAIDs  Past Medical History  Diagnosis Date  . Hyperlipidemia   . Hypertension   . PSA, INCREASED 12/05/2009  . KIDNEY STONE 12/02/2008    History   Social History  . Marital Status: Married    Spouse Name: N/A    Number of Children: N/A  . Years of Education: N/A   Occupational History  . Not on file.   Social History Main Topics  . Smoking status: Never Smoker   . Smokeless tobacco: Never Used  . Alcohol Use: Yes  . Drug Use: No  . Sexually Active: Not on file   Other Topics Concern  . Not on file   Social History Narrative  . No narrative on file    Past Surgical History  Procedure Date  . Cholecystectomy     Family History  Problem Relation Age of Onset  . Heart attack Father     Allergies  Allergen Reactions  . Lisinopril     REACTION: cough  . Penicillins     REACTION: hives    Current Outpatient Prescriptions on File Prior to Visit  Medication Sig Dispense Refill  . aspirin 81 MG tablet Take 81 mg by mouth daily.        [name] losartan (COZAAR) 50 MG tablet Take 1 tablet (50 mg total) by mouth 2 (two) times daily.  180 tablet  1  . simvastatin (ZOCOR) 40 MG tablet Take 1 tablet (40 mg total) by mouth at bedtime.  90 tablet  3  . DISCONTD: allopurinol (ZYLOPRIM) 100 MG tablet Take 1 tablet (100 mg total) by mouth daily.  90 tablet  3  . colchicine 0.6 MG tablet Take 1 tablet (0.6 mg total) by mouth daily.  30 tablet  0  . tadalafil (CIALIS) 20 MG tablet Take 20 mg by mouth daily as needed.           patient denies chest pain, shortness of breath, orthopnea. Denies lower extremity edema, abdominal pain, change in appetite, change in bowel movements. Patient denies  rashes, musculoskeletal complaints. No other specific complaints in a complete review of systems.   BP 120/80  Temp(Src) 98.4 F (36.9 C) (Oral)  Wt 268 lb (121.564 kg)  well-developed well-nourished male in no acute distress. HEENT exam atraumatic, normocephalic, neck supple without jugular venous distention. Chest clear to auscultation cardiac exam S1-S2 are regular. Abdominal exam overweight with bowel sounds, soft and nontender. Extremities: swollen, red, painful right MTP joint.

## 2011-12-17 NOTE — Assessment & Plan Note (Signed)
Recent exacerbation of gout Check labs today Increase allopurinol  Has already been on prednisone  Trial colchicine for one week.  Call me in one week

## 2011-12-24 ENCOUNTER — Telehealth: Payer: Self-pay | Admitting: Internal Medicine

## 2011-12-24 ENCOUNTER — Telehealth: Payer: Self-pay | Admitting: Family Medicine

## 2011-12-24 MED ORDER — ALLOPURINOL 300 MG PO TABS: 300.0000 mg | ORAL_TABLET | Freq: Every day | ORAL | Status: DC

## 2011-12-24 NOTE — Telephone Encounter (Signed)
Patient called stating that the MD increased his alpurinol to 3 tabs per day and he need that new rx [facility]. Please assist.

## 2011-12-24 NOTE — Telephone Encounter (Signed)
Pulled from Triage vmail. Pt states that Dr. Leanne Chang wanted pt to call him this week regarding his R foot. Pt states that swelling has gone down and that he is scheduling appt with Dr. Leanne Chang next week. However, currently the pt has not done so. Thanks!

## 2011-12-24 NOTE — Telephone Encounter (Signed)
rx sent in electroncially 

## 2011-12-24 NOTE — Telephone Encounter (Signed)
Pt will call back and schedule an OV

## 2012-01-31 DIAGNOSIS — M1710 Unilateral primary osteoarthritis, unspecified knee: Secondary | ICD-10-CM | POA: Diagnosis not present

## 2012-02-06 ENCOUNTER — Encounter: Payer: Self-pay | Admitting: Internal Medicine

## 2012-02-06 ENCOUNTER — Ambulatory Visit (INDEPENDENT_AMBULATORY_CARE_PROVIDER_SITE_OTHER): Payer: Medicare Other | Admitting: Internal Medicine

## 2012-02-06 VITALS — BP 114/66 | Temp 97.9°F | Wt 265.0 lb

## 2012-02-06 DIAGNOSIS — M109 Gout, unspecified: Secondary | ICD-10-CM | POA: Diagnosis not present

## 2012-02-06 DIAGNOSIS — I10 Essential (primary) hypertension: Secondary | ICD-10-CM | POA: Diagnosis not present

## 2012-02-06 MED ORDER — ALLOPURINOL 300 MG PO TABS: 300.0000 mg | ORAL_TABLET | Freq: Every day | ORAL | Status: DC

## 2012-02-06 MED ORDER — PREDNISONE 10 MG PO TABS: 10.0000 mg | ORAL_TABLET | Freq: Every day | ORAL | Status: DC

## 2012-02-06 MED ORDER — PREDNISONE 20 MG PO TABS: 20.0000 mg | ORAL_TABLET | Freq: Every day | ORAL | Status: DC

## 2012-02-06 MED ORDER — COLCHICINE 0.6 MG PO TABS: 0.6000 mg | ORAL_TABLET | Freq: Every day | ORAL | Status: AC

## 2012-02-06 NOTE — Patient Instructions (Addendum)
Call or return to clinic prn if these symptoms worsen or fail to improve as anticipated.  Colchicine  take twice daily for 2 weeks then once dailyGout Gout is an inflammatory condition (arthritis) caused by a buildup of uric acid crystals in the joints. Uric acid is a chemical that is normally present in the blood. Under some circumstances, uric acid can form into crystals in your joints. This causes joint redness, soreness, and swelling (inflammation). Repeat attacks are common. Over time, uric acid crystals can form into masses (tophi) near a joint, causing disfigurement. Gout is treatable and often preventable. CAUSES   The disease begins with elevated levels of uric acid in the blood. Uric acid is produced by your body when it breaks down a naturally found substance called purines. This also happens when you eat certain foods such as meats and fish. Causes of an elevated uric acid level include:  Being passed down from parent to child (heredity).   Diseases that cause increased uric acid production (obesity, psoriasis, some cancers).   Excessive alcohol use.   Diet, especially diets rich in meat and seafood.   Medicines, including certain cancer-fighting drugs (chemotherapy), diuretics, and aspirin.   Chronic kidney disease. The kidneys are no longer able to remove uric acid well.   Problems with metabolism.  Conditions strongly associated with gout include:  Obesity.   High blood pressure.   High cholesterol.   Diabetes.  Not everyone with elevated uric acid levels gets gout. It is not understood why some people get gout and others do not. Surgery, joint injury, and eating too much of certain foods are some of the factors that can lead to gout. SYMPTOMS    An attack of gout comes on quickly. It causes intense pain with redness, swelling, and warmth in a joint.   Fever can occur.   Often, only one joint is involved. Certain joints are more commonly involved:   Base of the  big toe.   Knee.   Ankle.   Wrist.   Finger.  Without treatment, an attack usually goes away in a few days to weeks. Between attacks, you usually will not have symptoms, which is different from many other forms of arthritis. DIAGNOSIS   Your caregiver will suspect gout based on your symptoms and exam. Removal of fluid from the joint (arthrocentesis) is done to check for uric acid crystals. Your caregiver will give you a medicine that numbs the area (local anesthetic) and use a needle to remove joint fluid for exam. Gout is confirmed when uric acid crystals are seen in joint fluid, using a special microscope. Sometimes, blood, urine, and X-ray tests are also used. TREATMENT   There are 2 phases to gout treatment: treating the sudden onset (acute) attack and preventing attacks (prophylaxis). Treatment of an Acute Attack  Medicines are used. These include anti-inflammatory medicines or steroid medicines.   An injection of steroid medicine into the affected joint is sometimes necessary.   The painful joint is rested. Movement can worsen the arthritis.   You may use warm or cold treatments on painful joints, depending which works best for you.   Discuss the use of coffee, vitamin C, or cherries with your caregiver. These may be helpful treatment options.  Treatment to Prevent Attacks After the acute attack subsides, your caregiver may advise prophylactic medicine. These medicines either help your kidneys eliminate uric acid from your body or decrease your uric acid production. You may need to stay on these medicines  for a very long time. The early phase of treatment with prophylactic medicine can be associated with an increase in acute gout attacks. For this reason, during the first few months of treatment, your caregiver may also advise you to take medicines usually used for acute gout treatment. Be sure you understand your caregiver's directions. You should also discuss dietary treatment  with your caregiver. Certain foods such as meats and fish can increase uric acid levels. Other foods such as dairy can decrease levels. Your caregiver can give you a list of foods to avoid. HOME CARE INSTRUCTIONS    Do not take aspirin to relieve pain. This raises uric acid levels.   Only take over-the-counter or prescription medicines for pain, discomfort, or fever as directed by your caregiver.   Rest the joint as much as possible. When in bed, keep sheets and blankets off painful areas.   Keep the affected joint raised (elevated).   Use crutches if the painful joint is in your leg.   Drink enough water and fluids to keep your urine clear or pale yellow. This helps your body get rid of uric acid. Do not drink alcoholic beverages. They slow the passage of uric acid.   Follow your caregiver's dietary instructions. Pay careful attention to the amount of protein you eat. Your daily diet should emphasize fruits, vegetables, whole grains, and fat-free or low-fat milk products.   Maintain a healthy body weight.  SEEK MEDICAL CARE IF:    You have an oral temperature above 102 F (38.9 C).   You develop diarrhea, vomiting, or any side effects from medicines.   You do not feel better in 24 hours, or you are getting worse.  SEEK IMMEDIATE MEDICAL CARE IF:    Your joint becomes suddenly more tender and you have:   Chills.   An oral temperature above 102 F (38.9 C), not controlled by medicine.  MAKE SURE YOU:    Understand these instructions.   Will watch your condition.   Will get help right away if you are not doing well or get worse.  Document Released: 07/27/2000 Document Revised: 07/19/2011 Document Reviewed: 11/07/2009 [facility] Patient Information 2012 [place].

## 2012-02-06 NOTE — Progress Notes (Signed)
  Subjective:    Patient ID: [name], male    DOB: [birth year], 78 y.o.   MRN: [id number]  HPI  78 year old patient who has hypertension nephrolithiasis and a history of gouty arthritis. He is seen today with recurrent pain involving the right great toe. He is slightly improved today. Uric acid level was checked last month. Medical regimen includes allopurinol 100 mg daily    Review of Systems  Musculoskeletal: Positive for joint swelling, arthralgias and gait problem.       Objective:   Physical Exam  Constitutional: He appears well-developed and well-nourished. No distress.       Overweight. Blood pressure low normal  Musculoskeletal:       The right first and TP joint was swollen erythematous and tender Slight edema both ankles          Assessment & Plan:  Recurrent gouty arthritis. We'll treat with oral prednisone as well as colchicine; additionally will increase allopurinol to 200 mg daily. We'll also maintain on colchicine once daily prophylaxis

## 2012-02-07 DIAGNOSIS — M1710 Unilateral primary osteoarthritis, unspecified knee: Secondary | ICD-10-CM | POA: Diagnosis not present

## 2012-02-12 DIAGNOSIS — M1710 Unilateral primary osteoarthritis, unspecified knee: Secondary | ICD-10-CM | POA: Diagnosis not present

## 2012-05-05 ENCOUNTER — Encounter: Payer: Self-pay | Admitting: Gastroenterology

## 2012-05-15 DIAGNOSIS — S83289A Other tear of lateral meniscus, current injury, unspecified knee, initial encounter: Secondary | ICD-10-CM | POA: Diagnosis not present

## 2012-05-16 DIAGNOSIS — N200 Calculus of kidney: Secondary | ICD-10-CM | POA: Diagnosis not present

## 2012-05-16 DIAGNOSIS — M1710 Unilateral primary osteoarthritis, unspecified knee: Secondary | ICD-10-CM | POA: Diagnosis not present

## 2012-05-16 DIAGNOSIS — R972 Elevated prostate specific antigen [PSA]: Secondary | ICD-10-CM | POA: Diagnosis not present

## 2012-06-02 ENCOUNTER — Other Ambulatory Visit: Payer: Self-pay | Admitting: Internal Medicine

## 2012-06-10 DIAGNOSIS — D485 Neoplasm of uncertain behavior of skin: Secondary | ICD-10-CM | POA: Diagnosis not present

## 2012-06-10 DIAGNOSIS — L219 Seborrheic dermatitis, unspecified: Secondary | ICD-10-CM | POA: Diagnosis not present

## 2012-06-10 DIAGNOSIS — L820 Inflamed seborrheic keratosis: Secondary | ICD-10-CM | POA: Diagnosis not present

## 2012-07-16 ENCOUNTER — Ambulatory Visit: Payer: Medicare Other | Admitting: Internal Medicine

## 2012-07-16 DIAGNOSIS — I129 Hypertensive chronic kidney disease with stage 1 through stage 4 chronic kidney disease, or unspecified chronic kidney disease: Secondary | ICD-10-CM | POA: Diagnosis not present

## 2012-07-16 DIAGNOSIS — Z23 Encounter for immunization: Secondary | ICD-10-CM | POA: Diagnosis not present

## 2012-07-16 DIAGNOSIS — N2581 Secondary hyperparathyroidism of renal origin: Secondary | ICD-10-CM | POA: Diagnosis not present

## 2012-07-16 DIAGNOSIS — I10 Essential (primary) hypertension: Secondary | ICD-10-CM | POA: Diagnosis not present

## 2012-07-16 DIAGNOSIS — E213 Hyperparathyroidism, unspecified: Secondary | ICD-10-CM | POA: Diagnosis not present

## 2012-07-16 DIAGNOSIS — M1990 Unspecified osteoarthritis, unspecified site: Secondary | ICD-10-CM | POA: Diagnosis not present

## 2012-07-21 DIAGNOSIS — Z79899 Other long term (current) drug therapy: Secondary | ICD-10-CM | POA: Diagnosis not present

## 2012-07-21 DIAGNOSIS — Z888 Allergy status to other drugs, medicaments and biological substances status: Secondary | ICD-10-CM | POA: Diagnosis not present

## 2012-07-21 DIAGNOSIS — E785 Hyperlipidemia, unspecified: Secondary | ICD-10-CM | POA: Diagnosis not present

## 2012-07-21 DIAGNOSIS — Z7982 Long term (current) use of aspirin: Secondary | ICD-10-CM | POA: Diagnosis not present

## 2012-07-21 DIAGNOSIS — I10 Essential (primary) hypertension: Secondary | ICD-10-CM | POA: Diagnosis not present

## 2012-07-21 DIAGNOSIS — M109 Gout, unspecified: Secondary | ICD-10-CM | POA: Diagnosis not present

## 2012-07-21 DIAGNOSIS — B9789 Other viral agents as the cause of diseases classified elsewhere: Secondary | ICD-10-CM | POA: Diagnosis not present

## 2012-07-21 DIAGNOSIS — Z880 Allergy status to penicillin: Secondary | ICD-10-CM | POA: Diagnosis not present

## 2012-07-24 DIAGNOSIS — I517 Cardiomegaly: Secondary | ICD-10-CM | POA: Diagnosis not present

## 2012-07-24 DIAGNOSIS — N390 Urinary tract infection, site not specified: Secondary | ICD-10-CM | POA: Diagnosis not present

## 2012-07-24 DIAGNOSIS — R52 Pain, unspecified: Secondary | ICD-10-CM | POA: Diagnosis not present

## 2012-07-24 DIAGNOSIS — Z79899 Other long term (current) drug therapy: Secondary | ICD-10-CM | POA: Diagnosis not present

## 2012-07-24 DIAGNOSIS — R9431 Abnormal electrocardiogram [ECG] [EKG]: Secondary | ICD-10-CM | POA: Diagnosis not present

## 2012-07-24 DIAGNOSIS — Z888 Allergy status to other drugs, medicaments and biological substances status: Secondary | ICD-10-CM | POA: Diagnosis not present

## 2012-07-24 DIAGNOSIS — R05 Cough: Secondary | ICD-10-CM | POA: Diagnosis not present

## 2012-07-24 DIAGNOSIS — R109 Unspecified abdominal pain: Secondary | ICD-10-CM | POA: Diagnosis not present

## 2012-07-24 DIAGNOSIS — E785 Hyperlipidemia, unspecified: Secondary | ICD-10-CM | POA: Diagnosis not present

## 2012-07-24 DIAGNOSIS — I10 Essential (primary) hypertension: Secondary | ICD-10-CM | POA: Diagnosis not present

## 2012-07-24 DIAGNOSIS — M109 Gout, unspecified: Secondary | ICD-10-CM | POA: Diagnosis not present

## 2012-07-24 DIAGNOSIS — Z7982 Long term (current) use of aspirin: Secondary | ICD-10-CM | POA: Diagnosis not present

## 2012-07-24 DIAGNOSIS — Z880 Allergy status to penicillin: Secondary | ICD-10-CM | POA: Diagnosis not present

## 2012-07-24 DIAGNOSIS — I4510 Unspecified right bundle-branch block: Secondary | ICD-10-CM | POA: Diagnosis not present

## 2012-07-24 DIAGNOSIS — R509 Fever, unspecified: Secondary | ICD-10-CM | POA: Diagnosis not present

## 2012-07-29 ENCOUNTER — Encounter: Payer: Self-pay | Admitting: Internal Medicine

## 2012-07-29 ENCOUNTER — Ambulatory Visit (INDEPENDENT_AMBULATORY_CARE_PROVIDER_SITE_OTHER): Payer: Medicare Other | Admitting: Internal Medicine

## 2012-07-29 VITALS — BP 134/72 | HR 60 | Temp 98.0°F | Wt 266.0 lb

## 2012-07-29 DIAGNOSIS — Z01810 Encounter for preprocedural cardiovascular examination: Secondary | ICD-10-CM

## 2012-07-29 DIAGNOSIS — N390 Urinary tract infection, site not specified: Secondary | ICD-10-CM

## 2012-07-29 DIAGNOSIS — M1710 Unilateral primary osteoarthritis, unspecified knee: Secondary | ICD-10-CM

## 2012-07-29 DIAGNOSIS — M1712 Unilateral primary osteoarthritis, left knee: Secondary | ICD-10-CM

## 2012-07-29 NOTE — Progress Notes (Signed)
Patient ID: [name], male   DOB: Mar 17, 1934, 78 y.o.   MRN: [id number] preop visit Scheduled left TKA in February Patient with recent uti-- resolved  He has no CV complaints- he is able to exercise without SOB or chest pain. Exercise is limited by left knee pain.  He had normal stress test in [place] about two years ago - pt's report.   Past Medical History  Diagnosis Date  . Hyperlipidemia   . Hypertension   . PSA, INCREASED 12/05/2009  . KIDNEY STONE 12/02/2008    History   Social History  . Marital Status: Married    Spouse Name: N/A    Number of Children: N/A  . Years of Education: N/A   Occupational History  . Not on file.   Social History Main Topics  . Smoking status: Never Smoker   . Smokeless tobacco: Never Used  . Alcohol Use: Yes  . Drug Use: No  . Sexually Active: Not on file   Other Topics Concern  . Not on file   Social History Narrative  . No narrative on file    Past Surgical History  Procedure Date  . Cholecystectomy     Family History  Problem Relation Age of Onset  . Heart attack Father     Allergies  Allergen Reactions  . Lisinopril     REACTION: cough  . Penicillins     REACTION: hives    Current Outpatient Prescriptions on File Prior to Visit  Medication Sig Dispense Refill  . allopurinol (ZYLOPRIM) 300 MG tablet Take 1 tablet (300 mg total) by mouth daily.  90 tablet  3  . aspirin 81 MG tablet Take 81 mg by mouth daily.        [name] losartan (COZAAR) 50 MG tablet TAKE 1 TABLET BY MOUTH TWO TIMES DAILY  180 tablet  2  . simvastatin (ZOCOR) 40 MG tablet Take 1 tablet (40 mg total) by mouth at bedtime.  90 tablet  3  . tadalafil (CIALIS) 20 MG tablet Take 20 mg by mouth daily as needed.           patient denies chest pain, shortness of breath, orthopnea. Denies lower extremity edema, abdominal pain, change in appetite, change in bowel movements. Patient denies rashes, musculoskeletal complaints. No other specific complaints in a  complete review of systems.   BP 134/72  Pulse 60  Temp 98 F (36.7 C) (Oral)  Wt 266 lb (120.657 kg)  well-developed well-nourished male in no acute distress. HEENT exam atraumatic, normocephalic, neck supple without jugular venous distention. Chest clear to auscultation cardiac exam S1-S2 are regular. Abdominal exam overweight with bowel sounds, soft and nontender. Extremities no edema. Neurologic exam is alert with a normal gait.   Preop visit-i checked an EKG which demonstrates some PVCs. I don't think any other cardiopulmonary evaluation is necessary. He had a stress test when he was in [place] about 2 years ago. He tells me that that was normal.  End-stage osteoarthritis of the knee. He is okay for surgery.

## 2012-08-14 DIAGNOSIS — N200 Calculus of kidney: Secondary | ICD-10-CM | POA: Diagnosis not present

## 2012-08-14 DIAGNOSIS — R82998 Other abnormal findings in urine: Secondary | ICD-10-CM | POA: Diagnosis not present

## 2012-08-14 DIAGNOSIS — R339 Retention of urine, unspecified: Secondary | ICD-10-CM | POA: Diagnosis not present

## 2012-08-27 ENCOUNTER — Other Ambulatory Visit: Payer: Medicare Other

## 2012-08-27 DIAGNOSIS — N390 Urinary tract infection, site not specified: Secondary | ICD-10-CM

## 2012-08-29 LAB — URINE CULTURE
Colony Count: NO GROWTH
Organism ID, Bacteria: NO GROWTH

## 2012-09-03 DIAGNOSIS — L219 Seborrheic dermatitis, unspecified: Secondary | ICD-10-CM | POA: Diagnosis not present

## 2012-09-03 DIAGNOSIS — L570 Actinic keratosis: Secondary | ICD-10-CM | POA: Diagnosis not present

## 2012-09-03 DIAGNOSIS — L738 Other specified follicular disorders: Secondary | ICD-10-CM | POA: Diagnosis not present

## 2012-09-10 ENCOUNTER — Encounter: Payer: Self-pay | Admitting: Physician Assistant

## 2012-09-10 ENCOUNTER — Encounter (HOSPITAL_COMMUNITY): Payer: Self-pay | Admitting: Pharmacy Technician

## 2012-09-10 ENCOUNTER — Other Ambulatory Visit: Payer: Self-pay | Admitting: Physician Assistant

## 2012-09-10 DIAGNOSIS — M1710 Unilateral primary osteoarthritis, unspecified knee: Secondary | ICD-10-CM | POA: Diagnosis not present

## 2012-09-10 NOTE — H&P (Signed)
TOTAL KNEE ADMISSION H&P  Patient is being admitted for left total knee arthroplasty.  Subjective:  Chief Complaint:left knee pain.  HPI: [name], 79 y.o. male, has a history of pain and functional disability in the left knee due to arthritis and has failed non-surgical conservative treatments for greater than 12 weeks to includeNSAID's and/or analgesics, corticosteriod injections, flexibility and strengthening excercises, supervised PT with diminished ADL's post treatment, use of assistive devices, weight reduction as appropriate and activity modification.  Onset of symptoms was gradual, starting 8 years ago with gradually worsening course since that time. The patient noted prior procedures on the knee to include  arthroscopy and menisectomy on the left knee(s).  Patient currently rates pain in the left knee(s) at 8 out of 10 with activity. Patient has night pain, worsening of pain with activity and weight bearing, pain that interferes with activities of daily living and joint swelling.  Patient has evidence of subchondral sclerosis, periarticular osteophytes and joint space narrowing by imaging studies.  There is no active infection.  Patient Active Problem List   Diagnosis Date Noted  . Gout 12/13/2010  . PSA, INCREASED 12/05/2009  . KIDNEY STONE 12/02/2008  . HYPERLIPIDEMIA 05/08/2007  . HYPERTENSION 05/08/2007   Past Medical History  Diagnosis Date  . Hyperlipidemia   . Hypertension   . PSA, INCREASED 12/05/2009  . KIDNEY STONE 12/02/2008    Past Surgical History  Procedure Date  . Cholecystectomy      (Not in a hospital admission) Allergies  Allergen Reactions  . Lisinopril     REACTION: cough  . Penicillins     REACTION: hives    History  Substance Use Topics  . Smoking status: Never Smoker   . Smokeless tobacco: Never Used  . Alcohol Use: Yes    Family History  Problem Relation Age of Onset  . Heart attack Father      Review of Systems  Constitutional:  Negative.   HENT: Negative.   Eyes: Negative.   Respiratory: Negative.   Cardiovascular: Negative.   Gastrointestinal: Negative.   Genitourinary: Positive for dysuria and urgency.  Musculoskeletal:       Left knee pain  Skin: Negative.   Neurological: Negative.   Endo/Heme/Allergies: Negative.   Psychiatric/Behavioral: Negative.     Objective:  Physical Exam  Constitutional: He is oriented to person, place, and time. He appears well-developed and well-nourished.  HENT:  Head: Normocephalic and atraumatic.  Mouth/Throat: Oropharynx is clear and moist.  Eyes: Conjunctivae normal and EOM are normal. Pupils are equal, round, and reactive to light.  Neck: Normal range of motion. Neck supple.  Cardiovascular: Normal rate and regular rhythm.  Exam reveals no gallop and no friction rub.   No murmur heard. Respiratory: Effort normal and breath sounds normal. No respiratory distress. He has no wheezes. He has no rales. He exhibits no tenderness.  GI: Soft. Bowel sounds are normal. He exhibits no distension. There is no tenderness. There is no rebound and no guarding.  Genitourinary:       Not pertinent to current symptomatology therefore not examined.  Musculoskeletal: He exhibits tenderness.       Examination of his left knee reveals pain medially and laterally.  1+ crepitation.  1+ synovitis.  Range of motion 0-125 degrees.  Knee is stable with normal patella tracking.  Examination of the right knee reveals full range of motion without pain, swelling, weakness or instability.  Vascular exam: Pulses are 2+ and symmetric.   Neurological:  He is alert and oriented to person, place, and time.  Skin: Skin is warm and dry.  Psychiatric: He has a normal mood and affect. His behavior is normal. Thought content normal.    Vital signs in last 24 hours: Last recorded: 01/29 1300   BP: 137/74 Pulse: 58  Temp: 97.7 F (36.5 C)    Height: 6' (1.829 m) SpO2: 97  Weight: 124.286 kg (274 lb)     Labs:   Estimated Body mass index is 37.16 kg/(m^2) as calculated from the following:   Height as of this encounter: 6\' 0" (1.829 m).   Weight as of this encounter: 274 lb(124.286 kg).   Imaging Review Plain radiographs demonstrate severe degenerative joint disease of the left knee(s). The overall alignment issignificant varus. The bone quality appears to be good for age and reported activity level.  Assessment/Plan:  End stage arthritis, left knee  Patient Active Problem List  Diagnosis  . HYPERLIPIDEMIA  . HYPERTENSION  . KIDNEY STONE  . PSA, INCREASED  . Gout    The patient history, physical examination, clinical judgment of the provider and imaging studies are consistent with end stage degenerative joint disease of the left knee(s) and total knee arthroplasty is deemed medically necessary. The treatment options including medical management, injection therapy arthroscopy and arthroplasty were discussed at length. The risks and benefits of total knee arthroplasty were presented and reviewed. The risks due to aseptic loosening, infection, stiffness, patella tracking problems, thromboembolic complications and other imponderables were discussed. The patient acknowledged the explanation, agreed to proceed with the plan and consent was signed. Patient is being admitted for inpatient treatment for surgery, pain control, PT, OT, prophylactic antibiotics, VTE prophylaxis, progressive ambulation and ADL's and discharge planning. The patient is planning to be discharged to skilled [place]

## 2012-09-13 NOTE — Pre-Procedure Instructions (Addendum)
JOHNAVON TARDO  09/13/2012   Your procedure is scheduled on:  09/22/2012  Report to [place] at 5:30 AM.  Call this number if you have problems the morning of surgery: [phone]   Remember:   Do not eat food or drink liquids after midnight.  On SUNDAY   Take these medicines the morning of surgery with A SIP OF WATER:allopurinol,  STOP aspirin, advil pm now     Do not wear jewelry  Do not wear lotions, powders, or perfumes. You maynot wear deodorant.              Men may shave face and neck.  Do not bring valuables to the hospital.  Contacts, dentures or bridgework may not be worn into surgery.  Leave suitcase in the car. After surgery it may be brought to your room.  For patients admitted to the hospital, checkout time is 11:00 AM the day of  discharge.   Patients discharged the day of surgery will not be allowed to drive  home.  Name and phone number of your driver: June [phone]  Special Instructions: Shower using CHG 2 nights before surgery and the night before surgery.  If you shower the day of surgery use CHG.  Use special wash - you have one bottle of CHG for all showers.  You should use approximately 1/3 of the bottle for each shower. incentive spirometry inst   Please read over the following fact sheets that you were given: Pain Booklet, Coughing and Deep Breathing, Blood Transfusion Information, MRSA Information and Surgical Site Infection Prevention

## 2012-09-15 ENCOUNTER — Encounter (HOSPITAL_COMMUNITY): Payer: Self-pay

## 2012-09-15 ENCOUNTER — Ambulatory Visit (HOSPITAL_COMMUNITY)
Admission: RE | Admit: 2012-09-15 | Discharge: 2012-09-15 | Disposition: A | Discharge status: Home / Self Care | Payer: Medicare Other | Source: Ambulatory Visit | Attending: Physician Assistant | Admitting: Physician Assistant

## 2012-09-15 ENCOUNTER — Encounter (HOSPITAL_COMMUNITY)
Admission: RE | Admit: 2012-09-15 | Discharge: 2012-09-15 | Disposition: A | Discharge status: Home / Self Care | Payer: Medicare Other | Source: Ambulatory Visit | Attending: Orthopedic Surgery | Admitting: Orthopedic Surgery

## 2012-09-15 DIAGNOSIS — J9819 Other pulmonary collapse: Secondary | ICD-10-CM | POA: Diagnosis not present

## 2012-09-15 DIAGNOSIS — Z01818 Encounter for other preprocedural examination: Secondary | ICD-10-CM | POA: Diagnosis not present

## 2012-09-15 HISTORY — DX: Unspecified osteoarthritis, unspecified site: M19.90

## 2012-09-15 HISTORY — DX: Calculus in bladder: N21.0

## 2012-09-15 HISTORY — DX: Urinary tract infection, site not specified: N39.0

## 2012-09-15 LAB — APTT: aPTT: 31 seconds (ref 24–37)

## 2012-09-15 LAB — COMPREHENSIVE METABOLIC PANEL
BUN: 39 mg/dL — ABNORMAL HIGH (ref 6–23)
CO2: 25 mEq/L (ref 19–32)
Chloride: 105 mEq/L (ref 96–112)
Creatinine, Ser: 1.78 mg/dL — ABNORMAL HIGH (ref 0.50–1.35)
GFR calc Af Amer: 41 mL/min — ABNORMAL LOW (ref >90)
GFR calc non Af Amer: 35 mL/min — ABNORMAL LOW (ref >90)
Glucose, Bld: 68 mg/dL — ABNORMAL LOW (ref 70–99)
Total Bilirubin: 0.4 mg/dL (ref 0.3–1.2)

## 2012-09-15 LAB — CBC WITH DIFFERENTIAL/PLATELET
HCT: 44.2 % (ref 39.0–52.0)
Hemoglobin: 15.5 g/dL (ref 13.0–17.0)
Lymphocytes Relative: 28 % (ref 12–46)
MCV: 90.8 fL (ref 78.0–100.0)
Monocytes Absolute: 0.8 10*3/uL (ref 0.1–1.0)
Monocytes Relative: 10 % (ref 3–12)
Neutro Abs: 4.6 10*3/uL (ref 1.7–7.7)
WBC: 8 10*3/uL (ref 4.0–10.5)

## 2012-09-15 LAB — URINE MICROSCOPIC-ADD ON

## 2012-09-15 LAB — ABO/RH: ABO/RH(D): A NEG

## 2012-09-15 LAB — PROTIME-INR
INR: 0.97 (ref 0.00–1.49)
Prothrombin Time: 12.8 seconds (ref 11.6–15.2)

## 2012-09-15 LAB — URINALYSIS, ROUTINE W REFLEX MICROSCOPIC
Bilirubin Urine: NEGATIVE
Glucose, UA: NEGATIVE mg/dL
Ketones, ur: NEGATIVE mg/dL
pH: 5 (ref 5.0–8.0)

## 2012-09-15 LAB — SURGICAL PCR SCREEN: MRSA, PCR: NEGATIVE

## 2012-09-15 MED ORDER — CHLORHEXIDINE GLUCONATE 4 % EX LIQD: 60.0000 mL | Freq: Once | CUTANEOUS | Status: DC

## 2012-09-15 NOTE — Progress Notes (Signed)
09/15/12 1044  OBSTRUCTIVE SLEEP APNEA  Score 4 or greater  Results sent to PCP

## 2012-09-15 NOTE — Progress Notes (Signed)
Echo done 10 yrs ago [place] normal. Dr swords note 12/13

## 2012-09-16 ENCOUNTER — Encounter (HOSPITAL_COMMUNITY): Payer: Self-pay | Admitting: Vascular Surgery

## 2012-09-16 LAB — URINE CULTURE: Colony Count: >=100000

## 2012-09-16 NOTE — Consult Note (Signed)
Anesthesia Chart Review:  Patient is a 79 year old male scheduled for left TKR by Dr. Noemi Chapel on 09/22/12.  History includes obesity, non-smoker, HLD, HTN, kidney stones, recurrent UTIs, CKD stage 2-3 (Dr. Jimmy Footman), OA. OSA screening score was 4 or greater.  PCP is Dr. Phoebe Sharps who saw patient for a preoperative evaluation on 07/29/12, and felt "He is okay for surgery."  EKG on 07/29/12 showed SR, PVCs, incomplete right BBB.  This was reviewed by his PCP Dr. Leanne Chang who did not not recommend other cardiopulmonary evaluation preoperatively. He reportedly had a normal stress and echo in [place] several years ago, however records are not currently available.   CXR on 09/15/12 showed eventration of anterior right diaphragm with right basilar atelectasis. Enlargement of cardiac silhouette.  Preoperative labs noted.  Cr 1.78, BUN 39.  (Renal function appears stable since at least 11/2009.)  Urine culture shows > 100,000 colonies of E. Coli. Sensitivities are still pending. Results called to Matthew Saras, PA-C with Dr. Noemi Chapel.  She plans to start him on Cipro at least until sensitivities are known.  She will contact Mr. Floerke.  Patient has been evaluated by his PCP and cleared for this procedure.  If no acute changes then would anticipate he could proceed as planned.  Myra Gianotti, PA-C 09/16/12 1140

## 2012-09-17 ENCOUNTER — Other Ambulatory Visit: Payer: Self-pay | Admitting: Internal Medicine

## 2012-09-17 DIAGNOSIS — G4733 Obstructive sleep apnea (adult) (pediatric): Secondary | ICD-10-CM

## 2012-09-21 MED ORDER — VANCOMYCIN HCL 10 G IV SOLR
1500.0000 mg | INTRAVENOUS | Status: DC
  Filled 2012-09-21: qty 1500

## 2012-09-22 ENCOUNTER — Encounter (HOSPITAL_COMMUNITY): Payer: Self-pay | Admitting: Surgery

## 2012-09-22 ENCOUNTER — Inpatient Hospital Stay (HOSPITAL_COMMUNITY)
Admission: RE | Admit: 2012-09-22 | Discharge: 2012-09-26 | DRG: 470 | Disposition: A | Discharge status: Skilled Nursing Facility | Payer: Medicare Other | Source: Ambulatory Visit | Attending: Orthopedic Surgery | Admitting: Orthopedic Surgery

## 2012-09-22 ENCOUNTER — Inpatient Hospital Stay (HOSPITAL_COMMUNITY): Payer: Medicare Other | Admitting: Vascular Surgery

## 2012-09-22 ENCOUNTER — Encounter (HOSPITAL_COMMUNITY)
Admission: RE | Disposition: A | Discharge status: Skilled Nursing Facility | Payer: Self-pay | Source: Ambulatory Visit | Attending: Orthopedic Surgery

## 2012-09-22 ENCOUNTER — Encounter (HOSPITAL_COMMUNITY): Payer: Self-pay | Admitting: Vascular Surgery

## 2012-09-22 DIAGNOSIS — M25569 Pain in unspecified knee: Secondary | ICD-10-CM | POA: Diagnosis not present

## 2012-09-22 DIAGNOSIS — N390 Urinary tract infection, site not specified: Secondary | ICD-10-CM | POA: Diagnosis not present

## 2012-09-22 DIAGNOSIS — I10 Essential (primary) hypertension: Secondary | ICD-10-CM | POA: Diagnosis present

## 2012-09-22 DIAGNOSIS — M109 Gout, unspecified: Secondary | ICD-10-CM | POA: Diagnosis present

## 2012-09-22 DIAGNOSIS — I129 Hypertensive chronic kidney disease with stage 1 through stage 4 chronic kidney disease, or unspecified chronic kidney disease: Secondary | ICD-10-CM | POA: Diagnosis present

## 2012-09-22 DIAGNOSIS — N184 Chronic kidney disease, stage 4 (severe): Secondary | ICD-10-CM | POA: Diagnosis present

## 2012-09-22 DIAGNOSIS — A498 Other bacterial infections of unspecified site: Secondary | ICD-10-CM | POA: Diagnosis present

## 2012-09-22 DIAGNOSIS — S8990XA Unspecified injury of unspecified lower leg, initial encounter: Secondary | ICD-10-CM | POA: Diagnosis not present

## 2012-09-22 DIAGNOSIS — N209 Urinary calculus, unspecified: Secondary | ICD-10-CM | POA: Diagnosis present

## 2012-09-22 DIAGNOSIS — N400 Enlarged prostate without lower urinary tract symptoms: Secondary | ICD-10-CM | POA: Diagnosis present

## 2012-09-22 DIAGNOSIS — M1710 Unilateral primary osteoarthritis, unspecified knee: Principal | ICD-10-CM | POA: Diagnosis present

## 2012-09-22 DIAGNOSIS — Z471 Aftercare following joint replacement surgery: Secondary | ICD-10-CM | POA: Diagnosis not present

## 2012-09-22 DIAGNOSIS — E785 Hyperlipidemia, unspecified: Secondary | ICD-10-CM | POA: Diagnosis not present

## 2012-09-22 DIAGNOSIS — N183 Chronic kidney disease, stage 3 unspecified: Secondary | ICD-10-CM | POA: Diagnosis present

## 2012-09-22 DIAGNOSIS — R262 Difficulty in walking, not elsewhere classified: Secondary | ICD-10-CM | POA: Diagnosis not present

## 2012-09-22 DIAGNOSIS — M1990 Unspecified osteoarthritis, unspecified site: Secondary | ICD-10-CM | POA: Diagnosis not present

## 2012-09-22 DIAGNOSIS — M6281 Muscle weakness (generalized): Secondary | ICD-10-CM | POA: Diagnosis not present

## 2012-09-22 DIAGNOSIS — G8918 Other acute postprocedural pain: Secondary | ICD-10-CM | POA: Diagnosis not present

## 2012-09-22 DIAGNOSIS — Z96659 Presence of unspecified artificial knee joint: Secondary | ICD-10-CM | POA: Diagnosis not present

## 2012-09-22 DIAGNOSIS — M1712 Unilateral primary osteoarthritis, left knee: Secondary | ICD-10-CM | POA: Diagnosis present

## 2012-09-22 DIAGNOSIS — IMO0002 Reserved for concepts with insufficient information to code with codable children: Secondary | ICD-10-CM | POA: Diagnosis not present

## 2012-09-22 DIAGNOSIS — B9620 Unspecified Escherichia coli [E. coli] as the cause of diseases classified elsewhere: Secondary | ICD-10-CM | POA: Diagnosis present

## 2012-09-22 HISTORY — PX: TOTAL KNEE ARTHROPLASTY: SHX125

## 2012-09-22 LAB — URINALYSIS, MICROSCOPIC ONLY
Bilirubin Urine: NEGATIVE
Specific Gravity, Urine: 1.019 (ref 1.005–1.030)
Urobilinogen, UA: 0.2 mg/dL (ref 0.0–1.0)

## 2012-09-22 LAB — CBC
HCT: 39.2 % (ref 39.0–52.0)
MCHC: 34.2 g/dL (ref 30.0–36.0)
RDW: 13.9 % (ref 11.5–15.5)

## 2012-09-22 LAB — CREATININE, SERUM
Creatinine, Ser: 1.59 mg/dL — ABNORMAL HIGH (ref 0.50–1.35)
GFR calc non Af Amer: 41 mL/min — ABNORMAL LOW (ref >90)

## 2012-09-22 SURGERY — ARTHROPLASTY, KNEE, TOTAL
Anesthesia: General | Site: Knee | Laterality: Left | Wound class: Clean

## 2012-09-22 MED ORDER — DIPHENHYDRAMINE HCL 12.5 MG/5ML PO ELIX: 12.5000 mg | ORAL_SOLUTION | ORAL | Status: DC | PRN

## 2012-09-22 MED ORDER — OXYCODONE HCL 5 MG PO TABS
5.0000 mg | ORAL_TABLET | Freq: Once | ORAL | Status: AC | PRN
  Administered 2012-09-22: 5 mg via ORAL

## 2012-09-22 MED ORDER — ACETAMINOPHEN 325 MG PO TABS
650.0000 mg | ORAL_TABLET | Freq: Four times a day (QID) | ORAL | Status: DC | PRN
  Administered 2012-09-26: 650 mg via ORAL
  Filled 2012-09-22: qty 2

## 2012-09-22 MED ORDER — HYDROMORPHONE HCL PF 1 MG/ML IJ SOLN
INTRAMUSCULAR | Status: AC
  Filled 2012-09-22: qty 1

## 2012-09-22 MED ORDER — VANCOMYCIN HCL 1000 MG IV SOLR
1000.0000 mg | INTRAVENOUS | Status: DC | PRN
  Administered 2012-09-22: 1500 mg via INTRAVENOUS

## 2012-09-22 MED ORDER — OXYCODONE HCL 5 MG/5ML PO SOLN: 5.0000 mg | Freq: Once | ORAL | Status: AC | PRN

## 2012-09-22 MED ORDER — CELECOXIB 200 MG PO CAPS
200.0000 mg | ORAL_CAPSULE | Freq: Two times a day (BID) | ORAL | Status: DC
  Administered 2012-09-22 (×2): 200 mg via ORAL
  Filled 2012-09-22 (×4): qty 1

## 2012-09-22 MED ORDER — BUPIVACAINE-EPINEPHRINE 0.25% -1:200000 IJ SOLN
INTRAMUSCULAR | Status: DC | PRN
  Administered 2012-09-22: 30 mL

## 2012-09-22 MED ORDER — LIDOCAINE HCL (CARDIAC) 20 MG/ML IV SOLN
INTRAVENOUS | Status: DC | PRN
  Administered 2012-09-22: 90 mg via INTRAVENOUS

## 2012-09-22 MED ORDER — POTASSIUM CHLORIDE IN NACL 20-0.9 MEQ/L-% IV SOLN
INTRAVENOUS | Status: DC
  Administered 2012-09-22: 14:00:00 via INTRAVENOUS
  Filled 2012-09-22 (×6): qty 1000

## 2012-09-22 MED ORDER — ALLOPURINOL 300 MG PO TABS
300.0000 mg | ORAL_TABLET | Freq: Every day | ORAL | Status: DC
  Administered 2012-09-22 – 2012-09-26 (×5): 300 mg via ORAL
  Filled 2012-09-22 (×5): qty 1

## 2012-09-22 MED ORDER — POVIDONE-IODINE 7.5 % EX SOLN: Freq: Once | CUTANEOUS | Status: DC

## 2012-09-22 MED ORDER — DOCUSATE SODIUM 100 MG PO CAPS
100.0000 mg | ORAL_CAPSULE | Freq: Two times a day (BID) | ORAL | Status: DC
  Administered 2012-09-22 – 2012-09-26 (×9): 100 mg via ORAL
  Filled 2012-09-22 (×11): qty 1

## 2012-09-22 MED ORDER — ACETAMINOPHEN 10 MG/ML IV SOLN
1000.0000 mg | Freq: Four times a day (QID) | INTRAVENOUS | Status: AC
  Administered 2012-09-22 – 2012-09-23 (×4): 1000 mg via INTRAVENOUS
  Filled 2012-09-22 (×4): qty 100

## 2012-09-22 MED ORDER — METOCLOPRAMIDE HCL 5 MG/ML IJ SOLN
5.0000 mg | Freq: Three times a day (TID) | INTRAMUSCULAR | Status: DC | PRN
  Administered 2012-09-22 – 2012-09-23 (×2): 10 mg via INTRAVENOUS
  Filled 2012-09-22 (×2): qty 2

## 2012-09-22 MED ORDER — TOBRAMYCIN SULFATE 1.2 G IJ SOLR
INTRAMUSCULAR | Status: DC | PRN
  Administered 2012-09-22: 1.2 g

## 2012-09-22 MED ORDER — MENTHOL 3 MG MT LOZG: 1.0000 | LOZENGE | OROMUCOSAL | Status: DC | PRN

## 2012-09-22 MED ORDER — FENTANYL CITRATE 0.05 MG/ML IJ SOLN
INTRAMUSCULAR | Status: DC | PRN
  Administered 2012-09-22: 100 ug via INTRAVENOUS
  Administered 2012-09-22 (×7): 25 ug via INTRAVENOUS
  Administered 2012-09-22: 75 ug via INTRAVENOUS

## 2012-09-22 MED ORDER — PROMETHAZINE HCL 25 MG/ML IJ SOLN: 6.2500 mg | INTRAMUSCULAR | Status: DC | PRN

## 2012-09-22 MED ORDER — OXYCODONE HCL 5 MG PO TABS
ORAL_TABLET | ORAL | Status: AC
  Filled 2012-09-22: qty 1

## 2012-09-22 MED ORDER — MIDAZOLAM HCL 5 MG/5ML IJ SOLN: INTRAMUSCULAR | Status: DC | PRN

## 2012-09-22 MED ORDER — BISACODYL 5 MG PO TBEC
10.0000 mg | DELAYED_RELEASE_TABLET | Freq: Every day | ORAL | Status: DC
  Administered 2012-09-22 – 2012-09-25 (×4): 10 mg via ORAL
  Filled 2012-09-22 (×4): qty 2

## 2012-09-22 MED ORDER — ALUM & MAG HYDROXIDE-SIMETH 200-200-20 MG/5ML PO SUSP: 30.0000 mL | ORAL | Status: DC | PRN

## 2012-09-22 MED ORDER — METOCLOPRAMIDE HCL 10 MG PO TABS: 5.0000 mg | ORAL_TABLET | Freq: Three times a day (TID) | ORAL | Status: DC | PRN

## 2012-09-22 MED ORDER — OXYCODONE HCL 5 MG PO TABS
5.0000 mg | ORAL_TABLET | ORAL | Status: DC | PRN
  Administered 2012-09-22: 10 mg via ORAL
  Administered 2012-09-22: 5 mg via ORAL
  Administered 2012-09-23 (×2): 10 mg via ORAL
  Administered 2012-09-23: 5 mg via ORAL
  Administered 2012-09-23: 10 mg via ORAL
  Administered 2012-09-23: 5 mg via ORAL
  Administered 2012-09-23 – 2012-09-26 (×13): 10 mg via ORAL
  Filled 2012-09-22 (×6): qty 2
  Filled 2012-09-22 (×2): qty 1
  Filled 2012-09-22 (×13): qty 2
  Filled 2012-09-22: qty 1

## 2012-09-22 MED ORDER — PROPOFOL 10 MG/ML IV BOLUS
INTRAVENOUS | Status: DC | PRN
  Administered 2012-09-22: 100 mg via INTRAVENOUS
  Administered 2012-09-22: 200 mg via INTRAVENOUS

## 2012-09-22 MED ORDER — HYDROMORPHONE HCL PF 1 MG/ML IJ SOLN
0.2500 mg | INTRAMUSCULAR | Status: DC | PRN
  Administered 2012-09-22 (×3): 0.5 mg via INTRAVENOUS
  Administered 2012-09-22: 10:00:00 via INTRAVENOUS
  Administered 2012-09-22 (×2): 0.5 mg via INTRAVENOUS

## 2012-09-22 MED ORDER — TAMSULOSIN HCL 0.4 MG PO CAPS
0.4000 mg | ORAL_CAPSULE | Freq: Every day | ORAL | Status: DC
  Administered 2012-09-22 – 2012-09-25 (×4): 0.4 mg via ORAL
  Filled 2012-09-22 (×5): qty 1

## 2012-09-22 MED ORDER — ENOXAPARIN SODIUM 30 MG/0.3ML ~~LOC~~ SOLN
30.0000 mg | Freq: Two times a day (BID) | SUBCUTANEOUS | Status: DC
  Administered 2012-09-23 – 2012-09-26 (×7): 30 mg via SUBCUTANEOUS
  Filled 2012-09-22 (×10): qty 0.3

## 2012-09-22 MED ORDER — VANCOMYCIN HCL IN DEXTROSE 1-5 GM/200ML-% IV SOLN
1000.0000 mg | Freq: Two times a day (BID) | INTRAVENOUS | Status: AC
  Administered 2012-09-22: 1000 mg via INTRAVENOUS
  Filled 2012-09-22: qty 200

## 2012-09-22 MED ORDER — ONDANSETRON HCL 4 MG/2ML IJ SOLN
4.0000 mg | Freq: Four times a day (QID) | INTRAMUSCULAR | Status: DC | PRN
  Administered 2012-09-23: 4 mg via INTRAVENOUS
  Filled 2012-09-22: qty 2

## 2012-09-22 MED ORDER — HYDROMORPHONE HCL PF 1 MG/ML IJ SOLN
0.5000 mg | INTRAMUSCULAR | Status: DC | PRN
  Administered 2012-09-22 – 2012-09-23 (×2): 1 mg via INTRAVENOUS
  Filled 2012-09-22: qty 1

## 2012-09-22 MED ORDER — ONDANSETRON HCL 4 MG/2ML IJ SOLN
INTRAMUSCULAR | Status: DC | PRN
  Administered 2012-09-22: 4 mg via INTRAVENOUS

## 2012-09-22 MED ORDER — TOBRAMYCIN SULFATE 1.2 G IJ SOLR
INTRAMUSCULAR | Status: AC
  Filled 2012-09-22: qty 1.2

## 2012-09-22 MED ORDER — ACETAMINOPHEN 650 MG RE SUPP: 650.0000 mg | Freq: Four times a day (QID) | RECTAL | Status: DC | PRN

## 2012-09-22 MED ORDER — ACETAMINOPHEN 10 MG/ML IV SOLN
INTRAVENOUS | Status: DC | PRN
  Administered 2012-09-22: 1000 mg via INTRAVENOUS

## 2012-09-22 MED ORDER — BUPIVACAINE-EPINEPHRINE PF 0.25-1:200000 % IJ SOLN
INTRAMUSCULAR | Status: AC
  Filled 2012-09-22: qty 30

## 2012-09-22 MED ORDER — SODIUM CHLORIDE 0.9 % IV SOLN
INTRAVENOUS | Status: DC | PRN
  Administered 2012-09-22: 07:00:00 via INTRAVENOUS

## 2012-09-22 MED ORDER — LACTATED RINGERS IV SOLN
INTRAVENOUS | Status: DC | PRN
  Administered 2012-09-22 (×2): via INTRAVENOUS

## 2012-09-22 MED ORDER — PHENOL 1.4 % MT LIQD: 1.0000 | OROMUCOSAL | Status: DC | PRN

## 2012-09-22 MED ORDER — BUPIVACAINE-EPINEPHRINE PF 0.5-1:200000 % IJ SOLN
INTRAMUSCULAR | Status: DC | PRN
  Administered 2012-09-22: 30 mL

## 2012-09-22 MED ORDER — LACTATED RINGERS IV SOLN: INTRAVENOUS | Status: DC

## 2012-09-22 MED ORDER — MIDAZOLAM HCL 5 MG/5ML IJ SOLN
INTRAMUSCULAR | Status: DC | PRN
  Administered 2012-09-22: 2 mg via INTRAVENOUS

## 2012-09-22 MED ORDER — MEPERIDINE HCL 25 MG/ML IJ SOLN: 6.2500 mg | INTRAMUSCULAR | Status: DC | PRN

## 2012-09-22 MED ORDER — DEXAMETHASONE 6 MG PO TABS
10.0000 mg | ORAL_TABLET | Freq: Every day | ORAL | Status: AC
  Administered 2012-09-22 – 2012-09-24 (×3): 10 mg via ORAL
  Filled 2012-09-22 (×3): qty 1

## 2012-09-22 MED ORDER — ZOLPIDEM TARTRATE 5 MG PO TABS: 5.0000 mg | ORAL_TABLET | Freq: Every evening | ORAL | Status: DC | PRN

## 2012-09-22 MED ORDER — PHENYLEPHRINE HCL 10 MG/ML IJ SOLN
INTRAMUSCULAR | Status: DC | PRN
  Administered 2012-09-22 (×5): 80 ug via INTRAVENOUS

## 2012-09-22 MED ORDER — SODIUM CHLORIDE 0.9 % IR SOLN
Status: DC | PRN
  Administered 2012-09-22: 1000 mL

## 2012-09-22 MED ORDER — DEXAMETHASONE SODIUM PHOSPHATE 10 MG/ML IJ SOLN
10.0000 mg | Freq: Every day | INTRAMUSCULAR | Status: AC
  Filled 2012-09-22 (×3): qty 1

## 2012-09-22 MED ORDER — CEFUROXIME SODIUM 1.5 G IJ SOLR
INTRAMUSCULAR | Status: AC
  Filled 2012-09-22: qty 1.5

## 2012-09-22 MED ORDER — ACETAMINOPHEN 10 MG/ML IV SOLN
INTRAVENOUS | Status: AC
  Filled 2012-09-22: qty 100

## 2012-09-22 MED ORDER — ONDANSETRON HCL 4 MG PO TABS
4.0000 mg | ORAL_TABLET | Freq: Four times a day (QID) | ORAL | Status: DC | PRN
  Administered 2012-09-22: 4 mg via ORAL
  Filled 2012-09-22: qty 1

## 2012-09-22 MED ORDER — HYDROMORPHONE HCL PF 1 MG/ML IJ SOLN
INTRAMUSCULAR | Status: AC
  Filled 2012-09-22: qty 2

## 2012-09-22 MED ORDER — SIMVASTATIN 40 MG PO TABS
40.0000 mg | ORAL_TABLET | Freq: Every day | ORAL | Status: DC
  Administered 2012-09-22 – 2012-09-25 (×4): 40 mg via ORAL
  Filled 2012-09-22 (×5): qty 1

## 2012-09-22 MED ORDER — EPHEDRINE SULFATE 50 MG/ML IJ SOLN
INTRAMUSCULAR | Status: DC | PRN
  Administered 2012-09-22: 10 mg via INTRAVENOUS

## 2012-09-22 SURGICAL SUPPLY — 71 items
BANDAGE ESMARK 6X9 LF (GAUZE/BANDAGES/DRESSINGS) ×1 IMPLANT
BLADE SAGITTAL 25.0X1.19X90 (BLADE) ×2 IMPLANT
BLADE SAW SGTL 11.0X1.19X90.0M (BLADE) IMPLANT
BLADE SAW SGTL 13.0X1.19X90.0M (BLADE) ×2 IMPLANT
BLADE SURG 10 STRL SS (BLADE) ×4 IMPLANT
BNDG ELASTIC 6X15 VLCR STRL LF (GAUZE/BANDAGES/DRESSINGS) ×2 IMPLANT
BNDG ESMARK 6X9 LF (GAUZE/BANDAGES/DRESSINGS) ×2
BOWL SMART MIX CTS (DISPOSABLE) ×2 IMPLANT
CEMENT HV SMART SET (Cement) ×4 IMPLANT
CLOTH BEACON ORANGE TIMEOUT ST (SAFETY) ×2 IMPLANT
COVER BACK TABLE 24X17X13 BIG (DRAPES) IMPLANT
COVER SURGICAL LIGHT HANDLE (MISCELLANEOUS) ×2 IMPLANT
CUFF TOURNIQUET SINGLE 34IN LL (TOURNIQUET CUFF) ×2 IMPLANT
CUFF TOURNIQUET SINGLE 44IN (TOURNIQUET CUFF) IMPLANT
DRAPE EXTREMITY T 121X128X90 (DRAPE) ×2 IMPLANT
DRAPE INCISE IOBAN 66X45 STRL (DRAPES) ×2 IMPLANT
DRAPE PROXIMA HALF (DRAPES) ×2 IMPLANT
DRAPE U-SHAPE 47X51 STRL (DRAPES) ×2 IMPLANT
DRSG ADAPTIC 3X8 NADH LF (GAUZE/BANDAGES/DRESSINGS) ×2 IMPLANT
DRSG PAD ABDOMINAL 8X10 ST (GAUZE/BANDAGES/DRESSINGS) ×4 IMPLANT
DURAPREP 26ML APPLICATOR (WOUND CARE) ×2 IMPLANT
ELECT CAUTERY BLADE 6.4 (BLADE) ×2 IMPLANT
ELECT REM PT RETURN 9FT ADLT (ELECTROSURGICAL) ×2
ELECTRODE REM PT RTRN 9FT ADLT (ELECTROSURGICAL) ×1 IMPLANT
EVACUATOR 1/8 PVC DRAIN (DRAIN) ×2 IMPLANT
FACESHIELD LNG OPTICON STERILE (SAFETY) ×2 IMPLANT
GLOVE BIO SURGEON STRL SZ7 (GLOVE) ×4 IMPLANT
GLOVE BIOGEL PI IND STRL 7.0 (GLOVE) ×2 IMPLANT
GLOVE BIOGEL PI IND STRL 7.5 (GLOVE) ×1 IMPLANT
GLOVE BIOGEL PI INDICATOR 7.0 (GLOVE) ×2
GLOVE BIOGEL PI INDICATOR 7.5 (GLOVE) ×1
GLOVE BIOGEL PI ORTHO PRO 7.5 (GLOVE) ×1
GLOVE PI ORTHO PRO STRL 7.5 (GLOVE) ×1 IMPLANT
GLOVE SS BIOGEL STRL SZ 7.5 (GLOVE) ×1 IMPLANT
GLOVE SUPERSENSE BIOGEL SZ 7.5 (GLOVE) ×1
GLOVE SURG SS PI 7.5 STRL IVOR (GLOVE) ×2 IMPLANT
GOWN PREVENTION PLUS XLARGE (GOWN DISPOSABLE) ×4 IMPLANT
GOWN STRL NON-REIN LRG LVL3 (GOWN DISPOSABLE) ×2 IMPLANT
GOWN STRL REIN XL XLG (GOWN DISPOSABLE) ×4 IMPLANT
HANDPIECE INTERPULSE COAX TIP (DISPOSABLE) ×1
HOOD PEEL AWAY FACE SHEILD DIS (HOOD) ×4 IMPLANT
IMMOBILIZER KNEE 22 UNIV (SOFTGOODS) ×4 IMPLANT
INSERT CUSHION PRONEVIEW LG (MISCELLANEOUS) IMPLANT
KIT BASIN OR (CUSTOM PROCEDURE TRAY) ×2 IMPLANT
KIT ROOM TURNOVER OR (KITS) ×2 IMPLANT
MANIFOLD NEPTUNE II (INSTRUMENTS) ×2 IMPLANT
NEEDLE 1/2 CIR CATGUT .05X1.09 (NEEDLE) ×2 IMPLANT
NS IRRIG 1000ML POUR BTL (IV SOLUTION) ×2 IMPLANT
PACK TOTAL JOINT (CUSTOM PROCEDURE TRAY) ×2 IMPLANT
PAD ARMBOARD 7.5X6 YLW CONV (MISCELLANEOUS) ×2 IMPLANT
PAD CAST 4YDX4 CTTN HI CHSV (CAST SUPPLIES) ×1 IMPLANT
PADDING CAST COTTON 4X4 STRL (CAST SUPPLIES) ×1
PADDING CAST COTTON 6X4 STRL (CAST SUPPLIES) ×2 IMPLANT
POSITIONER HEAD PRONE TRACH (MISCELLANEOUS) ×2 IMPLANT
RUBBERBAND STERILE (MISCELLANEOUS) ×2 IMPLANT
SET HNDPC FAN SPRY TIP SCT (DISPOSABLE) ×1 IMPLANT
SPONGE GAUZE 4X4 12PLY (GAUZE/BANDAGES/DRESSINGS) ×2 IMPLANT
STRIP CLOSURE SKIN 1/2X4 (GAUZE/BANDAGES/DRESSINGS) ×2 IMPLANT
SUCTION FRAZIER TIP 10 FR DISP (SUCTIONS) ×2 IMPLANT
SUT ETHIBOND NAB CT1 #1 30IN (SUTURE) ×4 IMPLANT
SUT MNCRL AB 3-0 PS2 18 (SUTURE) ×2 IMPLANT
SUT VIC AB 0 CT1 27 (SUTURE) ×2
SUT VIC AB 0 CT1 27XBRD ANBCTR (SUTURE) ×2 IMPLANT
SUT VIC AB 2-0 CT1 27 (SUTURE) ×2
SUT VIC AB 2-0 CT1 TAPERPNT 27 (SUTURE) ×2 IMPLANT
SYR 30ML SLIP (SYRINGE) ×2 IMPLANT
SYR CONTROL 10ML LL (SYRINGE) ×2 IMPLANT
TOWEL OR 17X24 6PK STRL BLUE (TOWEL DISPOSABLE) ×2 IMPLANT
TOWEL OR 17X26 10 PK STRL BLUE (TOWEL DISPOSABLE) ×2 IMPLANT
TRAY FOLEY CATH 14FR (SET/KITS/TRAYS/PACK) ×2 IMPLANT
WATER STERILE IRR 1000ML POUR (IV SOLUTION) ×4 IMPLANT

## 2012-09-22 NOTE — H&P (View-Only) (Signed)
TOTAL KNEE ADMISSION H&P  Patient is being admitted for left total knee arthroplasty.  Subjective:  Chief Complaint:left knee pain.  HPI: [name], 79 y.o. male, has a history of pain and functional disability in the left knee due to arthritis and has failed non-surgical conservative treatments for greater than 12 weeks to includeNSAID's and/or analgesics, corticosteriod injections, flexibility and strengthening excercises, supervised PT with diminished ADL's post treatment, use of assistive devices, weight reduction as appropriate and activity modification.  Onset of symptoms was gradual, starting 8 years ago with gradually worsening course since that time. The patient noted prior procedures on the knee to include  arthroscopy and menisectomy on the left knee(s).  Patient currently rates pain in the left knee(s) at 8 out of 10 with activity. Patient has night pain, worsening of pain with activity and weight bearing, pain that interferes with activities of daily living and joint swelling.  Patient has evidence of subchondral sclerosis, periarticular osteophytes and joint space narrowing by imaging studies.  There is no active infection.  Patient Active Problem List   Diagnosis Date Noted  . Gout 12/13/2010  . PSA, INCREASED 12/05/2009  . KIDNEY STONE 12/02/2008  . HYPERLIPIDEMIA 05/08/2007  . HYPERTENSION 05/08/2007   Past Medical History  Diagnosis Date  . Hyperlipidemia   . Hypertension   . PSA, INCREASED 12/05/2009  . KIDNEY STONE 12/02/2008    Past Surgical History  Procedure Date  . Cholecystectomy      (Not in a hospital admission) Allergies  Allergen Reactions  . Lisinopril     REACTION: cough  . Penicillins     REACTION: hives    History  Substance Use Topics  . Smoking status: Never Smoker   . Smokeless tobacco: Never Used  . Alcohol Use: Yes    Family History  Problem Relation Age of Onset  . Heart attack Father      Review of Systems  Constitutional:  Negative.   HENT: Negative.   Eyes: Negative.   Respiratory: Negative.   Cardiovascular: Negative.   Gastrointestinal: Negative.   Genitourinary: Positive for dysuria and urgency.  Musculoskeletal:       Left knee pain  Skin: Negative.   Neurological: Negative.   Endo/Heme/Allergies: Negative.   Psychiatric/Behavioral: Negative.     Objective:  Physical Exam  Constitutional: He is oriented to person, place, and time. He appears well-developed and well-nourished.  HENT:  Head: Normocephalic and atraumatic.  Mouth/Throat: Oropharynx is clear and moist.  Eyes: Conjunctivae normal and EOM are normal. Pupils are equal, round, and reactive to light.  Neck: Normal range of motion. Neck supple.  Cardiovascular: Normal rate and regular rhythm.  Exam reveals no gallop and no friction rub.   No murmur heard. Respiratory: Effort normal and breath sounds normal. No respiratory distress. He has no wheezes. He has no rales. He exhibits no tenderness.  GI: Soft. Bowel sounds are normal. He exhibits no distension. There is no tenderness. There is no rebound and no guarding.  Genitourinary:       Not pertinent to current symptomatology therefore not examined.  Musculoskeletal: He exhibits tenderness.       Examination of his left knee reveals pain medially and laterally.  1+ crepitation.  1+ synovitis.  Range of motion 0-125 degrees.  Knee is stable with normal patella tracking.  Examination of the right knee reveals full range of motion without pain, swelling, weakness or instability.  Vascular exam: Pulses are 2+ and symmetric.   Neurological:  He is alert and oriented to person, place, and time.  Skin: Skin is warm and dry.  Psychiatric: He has a normal mood and affect. His behavior is normal. Thought content normal.    Vital signs in last 24 hours: Last recorded: 01/29 1300   BP: 137/74 Pulse: 58  Temp: 97.7 F (36.5 C)    Height: 6' (1.829 m) SpO2: 97  Weight: 124.286 kg (274 lb)     Labs:   Estimated Body mass index is 37.16 kg/(m^2) as calculated from the following:   Height as of this encounter: 6\' 0" (1.829 m).   Weight as of this encounter: 274 lb(124.286 kg).   Imaging Review Plain radiographs demonstrate severe degenerative joint disease of the left knee(s). The overall alignment issignificant varus. The bone quality appears to be good for age and reported activity level.  Assessment/Plan:  End stage arthritis, left knee  Patient Active Problem List  Diagnosis  . HYPERLIPIDEMIA  . HYPERTENSION  . KIDNEY STONE  . PSA, INCREASED  . Gout    The patient history, physical examination, clinical judgment of the provider and imaging studies are consistent with end stage degenerative joint disease of the left knee(s) and total knee arthroplasty is deemed medically necessary. The treatment options including medical management, injection therapy arthroscopy and arthroplasty were discussed at length. The risks and benefits of total knee arthroplasty were presented and reviewed. The risks due to aseptic loosening, infection, stiffness, patella tracking problems, thromboembolic complications and other imponderables were discussed. The patient acknowledged the explanation, agreed to proceed with the plan and consent was signed. Patient is being admitted for inpatient treatment for surgery, pain control, PT, OT, prophylactic antibiotics, VTE prophylaxis, progressive ambulation and ADL's and discharge planning. The patient is planning to be discharged to skilled [place]

## 2012-09-22 NOTE — Evaluation (Signed)
Physical Therapy Evaluation Patient Details Name: [name] MRN: [id number] DOB: 1933-12-06 Today's Date: 09/22/2012 Time: [id number] PT Time Calculation (min): 22 min  PT Assessment / Plan / Recommendation Clinical Impression  Pt is 79 yo male admitted 2/10 for LTKA. Pt was able to get up and ambulate 5 ft same day of surgery. Pt. plans SNF rehab. Pt. will benefit from PT while in acute care.    PT Assessment  Patient needs continued PT services    Follow Up Recommendations  SNF    Does the patient have the potential to tolerate intense rehabilitation      Barriers to Discharge        Equipment Recommendations  None recommended by PT    Recommendations for Other Services     Frequency 7X/week    Precautions / Restrictions Precautions Precautions: Knee Required Braces or Orthoses: Knee Immobilizer - Left Knee Immobilizer - Left: Discontinue once straight leg raise with < 10 degree lag Restrictions Weight Bearing Restrictions: Yes LLE Weight Bearing: Weight bearing as tolerated   Pertinent Vitals/Pain 7 L knee pain.       Mobility  Bed Mobility Bed Mobility: Supine to Sit Supine to Sit: 4: Min assist;HOB flat Transfers Transfers: Sit to Stand;Stand to Sit Sit to Stand: 4: Min assist;From bed;With upper extremity assist;From elevated surface Stand to Sit: With upper extremity assist;4: Min assist;To chair/3-in-1 Details for Transfer Assistance: cues on hand placement and for LLE position Ambulation/Gait Ambulation/Gait Assistance: 1: +2 Total assist Ambulation/Gait: Patient Percentage: 70% Ambulation Distance (Feet): 5 Feet Assistive device: Rolling walker Ambulation/Gait Assistance Details: cues for sequence and poture. Gait Pattern: Step-to pattern;Antalgic    Exercises     PT Diagnosis: Difficulty walking;Acute pain  PT Problem List: Decreased strength;Decreased range of motion;Decreased activity tolerance;Decreased mobility;Decreased knowledge of use  of DME;Pain PT Treatment Interventions: DME instruction;Gait training;Functional mobility training;Therapeutic activities;Therapeutic exercise;Balance training   PT Goals Acute Rehab PT Goals PT Goal Formulation: With patient/family Time For Goal Achievement: 09/29/12 Potential to Achieve Goals: Good Pt will go Supine/Side to Sit: with supervision PT Goal: Supine/Side to Sit - Progress: Goal set today Pt will go Sit to Supine/Side: with supervision PT Goal: Sit to Supine/Side - Progress: Goal set today Pt will go Sit to Stand: with supervision PT Goal: Sit to Stand - Progress: Goal set today Pt will go Stand to Sit: with supervision PT Goal: Stand to Sit - Progress: Goal set today Pt will Ambulate: 51 - 150 feet;with supervision;with rolling walker PT Goal: Ambulate - Progress: Goal set today Pt will Perform Home Exercise Program: with supervision, verbal cues required/provided PT Goal: Perform Home Exercise Program - Progress: Goal set today  Visit Information  Last PT Received On: 09/22/12 Assistance Needed: +1    Subjective Data  Subjective: I don't know about how much I want to do Patient Stated Goal: I want to get up if I can   Prior Functioning  Home Living Lives With: Spouse Available Help at Discharge: Family Type of Home: [place]: Straight cane;Walker - rolling Prior Function Level of Independence: Independent Able to Take Stairs?: Yes Driving: Yes Vocation: Full time employment Communication Communication: No difficulties    Cognition  Cognition Overall Cognitive Status: Appears within functional limits for tasks assessed/performed Arousal/Alertness: Awake/alert Orientation Level: Appears intact for tasks assessed Behavior During Session: [facility] for tasks performed    Extremity/Trunk Assessment Right Lower Extremity Assessment RLE ROM/Strength/Tone: [facility] for tasks assessed RLE Sensation: [facility] -  Light Touch Left Lower  Extremity Assessment LLE ROM/Strength/Tone: Deficits LLE ROM/Strength/Tone Deficits: lifts leg but with significant lag LLE Sensation: WFL - Light Touch   Balance    End of Session PT - End of Session Equipment Utilized During Treatment: Left knee immobilizer Activity Tolerance: Patient limited by fatigue;Patient limited by pain Patient left: in chair;with family/visitor present;with call bell/phone within reach Nurse Communication: Mobility status CPM Left Knee CPM Left Knee: Off Additional Comments: PT took pt out  GP     Claretha Cooper 09/22/2012, 3:08 PM [phone]

## 2012-09-22 NOTE — Anesthesia Procedure Notes (Addendum)
Anesthesia Regional Block:  Femoral nerve block  Pre-Anesthetic Checklist: ,, timeout performed, Correct Patient, Correct Site, Correct Laterality, Correct Procedure, Correct Position, site marked, Risks and benefits discussed, at surgeon's request and post-op pain management  Laterality: Left and Upper  Prep: Betadine       Needles:  Injection technique: Single-shot  Needle Type: Stimulator Needle - 80      Needle Gauge: 22 and 22 G  Needle insertion depth: 6 cm   Additional Needles:  Procedures: nerve stimulator Femoral nerve block  Nerve Stimulator or Paresthesia:  Response: Twitch elicited, 0.8 mA,   Additional Responses:   Narrative:  Start time: 09/22/2012 6:50 AM End time: 09/22/2012 7:02 AM Injection made incrementally with aspirations every 5 mL.  Performed by: Personally  Anesthesiologist: Bartolo Darter, MD  Additional Notes: BP cuff, EKG monitors applied. Sedation begun. Femoral artery palpated for location of nerve. After nerve location anesthetic injected incrementally, slowly , and after neg aspirations. Tolerated well.  Femoral nerve block Procedure Name: LMA Insertion Date/Time: 09/22/2012 7:21 AM Performed by: Julian Reil Pre-anesthesia Checklist: Patient identified, Emergency Drugs available, Suction available and Patient being monitored Patient Re-evaluated:Patient Re-evaluated prior to inductionOxygen Delivery Method: Circle system utilized Preoxygenation: Pre-oxygenation with 100% oxygen Intubation Type: IV induction LMA: LMA inserted LMA Size: 4.0 Tube type: Oral Number of attempts: 1 Placement Confirmation: breath sounds checked- equal and bilateral and positive ETCO2 Tube secured with: Tape Dental Injury: Teeth and Oropharynx as per pre-operative assessment

## 2012-09-22 NOTE — Plan of Care (Signed)
Problem: Consults Goal: Diagnosis- Total Joint Replacement Primary Total Knee left

## 2012-09-22 NOTE — Preoperative (Signed)
Beta Blockers   Reason not to administer Beta Blockers:Not Applicable 

## 2012-09-22 NOTE — Interval H&P Note (Signed)
History and Physical Interval Note:  09/22/2012 7:00 AM  [name]  has presented today for surgery, with the diagnosis of DJD LEFT KNEE  The various methods of treatment have been discussed with the patient and family. After consideration of risks, benefits and other options for treatment, the patient has consented to  Procedure(s): TOTAL KNEE ARTHROPLASTY (Left) as a surgical intervention .  The patient's history has been reviewed, patient examined, no change in status, stable for surgery.  I have reviewed the patient's chart and labs.  Questions were answered to the patient's satisfaction.     [name]

## 2012-09-22 NOTE — Progress Notes (Signed)
Orthopedic Tech Progress Note Patient Details:  [name] 02/15/1934 [id number] CPM applied to Left LE with appropriate settings. OHF applied to bed.  CPM Left Knee CPM Left Knee: On Left Knee Flexion (Degrees): 60 Left Knee Extension (Degrees): 0   Asia R Thompson 09/22/2012, 10:54 AM

## 2012-09-22 NOTE — Anesthesia Postprocedure Evaluation (Signed)
  Anesthesia Post-op Note  Patient: [name]  Procedure(s) Performed: Procedure(s): TOTAL KNEE ARTHROPLASTY (Left)  Patient Location: PACU  Anesthesia Type:GA combined with regional for post-op pain  Level of Consciousness: awake, alert  and oriented  Airway and Oxygen Therapy: Patient Spontanous Breathing  Post-op Pain: moderate  Post-op Assessment: Post-op Vital signs reviewed  Post-op Vital Signs: stable  Complications: No apparent anesthesia complications

## 2012-09-22 NOTE — Transfer of Care (Signed)
Immediate Anesthesia Transfer of Care Note  Patient: [name]  Procedure(s) Performed: Procedure(s): TOTAL KNEE ARTHROPLASTY (Left)  Patient Location: PACU  Anesthesia Type:GA combined with regional for post-op pain  Level of Consciousness: awake, alert , oriented and patient cooperative  Airway & Oxygen Therapy: Patient Spontanous Breathing and Patient connected to nasal cannula oxygen  Post-op Assessment: Report given to PACU RN, Post -op Vital signs reviewed and stable and Patient moving all extremities  Post vital signs: Reviewed and stable  Complications: No apparent anesthesia complications

## 2012-09-22 NOTE — Progress Notes (Signed)
Contacted Dr. Orene Desanctis about patients BP of 189/108.  No New orders received, MD declined to treat BP

## 2012-09-22 NOTE — Op Note (Signed)
MRN:     [id number] DOB/AGE:    04/21/1934 / 79 y.o.       OPERATIVE REPORT    DATE OF PROCEDURE:  09/22/2012       PREOPERATIVE DIAGNOSIS:   DJD LEFT KNEE      Estimated body mass index is 36.07 kg/(m^2) as calculated from the following:   Height as of 09/15/12: 6' (1.829 m).   Weight as of 07/29/12: 120.657 kg (266 lb).                                                        POSTOPERATIVE DIAGNOSIS:   DJD LEFT KNEE                                                                      PROCEDURE:  Procedure(s): TOTAL KNEE ARTHROPLASTY Using Depuy Sigma RP implants #5 Femur, #6Tibia, 12.62mm sigma RP bearing, 35 Patella     SURGEON: Jamieon Lannen A    ASSISTANT:  Kirstin Shepperson PA-C   (Present and scrubbed throughout the case, critical for assistance with exposure, retraction, instrumentation, and closure.)         ANESTHESIA: GET with Femoral Nerve Block  DRAINS: foley, 2 medium hemovac in knee   TOURNIQUET TIME: [id number]   COMPLICATIONS:  None     SPECIMENS: None   INDICATIONS FOR PROCEDURE: The patient has  DJD LEFT KNEE, varus deformities, XR shows bone on bone arthritis. Patient has failed all conservative measures including anti-inflammatory medicines, narcotics, attempts at  exercise and weight loss, cortisone injections and viscosupplementation.  Risks and benefits of surgery have been discussed, questions answered.   DESCRIPTION OF PROCEDURE: The patient identified by armband, received  right femoral nerve block and IV antibiotics, in the holding area at [facility]. Patient taken to the operating room, appropriate anesthetic  monitors were attached General endotracheal anesthesia induced with  the patient in supine position, Foley catheter was inserted. Tourniquet  applied high to the operative thigh. Lateral post and foot positioner  applied to the table, the lower extremity was then prepped and draped  in usual sterile fashion from the ankle to the tourniquet.  Time-out procedure was performed. The limb was wrapped with an Esmarch bandage and the tourniquet inflated to 365 mmHg. We began the operation by making the anterior midline incision starting at handbreadth above the patella going over the patella 1 cm medial to and  4 cm distal to the tibial tubercle. Small bleeders in the skin and the  subcutaneous tissue identified and cauterized. Transverse retinaculum was incised and reflected medially and a medial parapatellar arthrotomy was accomplished. the patella was everted and theprepatellar fat pad resected. The superficial medial collateral  ligament was then elevated from anterior to posterior along the proximal  flare of the tibia and anterior half of the menisci resected. The knee was hyperflexed exposing bone on bone arthritis. Peripheral and notch osteophytes as well as the cruciate ligaments were then resected. We continued to  work our way around posteriorly along the proximal tibia, and externally  rotated the  tibia subluxing it out from underneath the femur. A McHale  retractor was placed through the notch and a lateral Hohmann retractor  placed, and we then drilled through the proximal tibia in line with the  axis of the tibia followed by an intramedullary guide rod and 2-degree  posterior slope cutting guide. The tibial cutting guide was pinned into place  allowing resection of 4 mm of bone medially and about 6 mm of bone  laterally because of her varus deformity. Satisfied with the tibial resection, we then  entered the distal femur 2 mm anterior to the PCL origin with the  intramedullary guide rod and applied the distal femoral cutting guide  set at 11mm, with 5 degrees of valgus. This was pinned along the  epicondylar axis. At this point, the distal femoral cut was accomplished without difficulty. We then sized for a #5 femoral component and pinned the guide in 3 degrees of external rotation.The chamfer cutting guide was pinned into place.  The anterior, posterior, and chamfer cuts were accomplished without difficulty followed by  the Sigma RP box cutting guide and the box cut. We also removed posterior osteophytes from the posterior femoral condyles. At this  time, the knee was brought into full extension. We checked our  extension and flexion gaps and found them symmetric at 12.51mm.  The patella thickness measured at 28 mm. We set the cutting guide at 18 and removed the posterior 9.5-10 mm  of the patella sized for 35 button and drilled the lollipop. The knee  was then once again hyperflexed exposing the proximal tibia. We sized for a #6 tibial base plate, applied the smokestack and the conical reamer followed by the the Delta fin keel punch. We then hammered into place the Sigma RP trial femoral component, inserted a 12.5-mm trial bearing, trial patellar button, and took the knee through range of motion from 0-130 degrees. No thumb pressure was required for patellar  tracking. At this point, all trial components were removed, a double batch of DePuy HV cement with 1500 mg of Zinacef was mixed and applied to all bony metallic mating surfaces except for the posterior condyles of the femur itself. In order, we  hammered into place the tibial tray and removed excess cement, the femoral component and removed excess cement, a 12.5-mm Sigma RP bearing  was inserted, and the knee brought to full extension with compression.  The patellar button was clamped into place, and excess cement  removed. While the cement cured the wound was irrigated out with normal saline solution pulse lavage, and medium Hemovac drains were placed.. Ligament stability and patellar tracking were checked and found to be excellent. The tourniquet was then released and hemostasis was obtained with cautery. The parapatellar arthrotomy was closed with  #1 ethibond suture. The subcutaneous tissue with 0 and 2-0 undyed  Vicryl suture, and 4-0 Monocryl.. A dressing of Xeroform,   4 x 4, dressing sponges, Webril, and Ace wrap applied. Needle and sponge count were correct times 2.The patient awakened, extubated, and taken to recovery room without difficulty. Vascular status was normal, pulses 2+ and symmetric.   Cortasia Screws A 09/22/2012, 9:01 AM

## 2012-09-22 NOTE — Progress Notes (Signed)
UR COMPLETED  

## 2012-09-22 NOTE — Anesthesia Preprocedure Evaluation (Signed)
Anesthesia Evaluation  Patient identified by MRN, date of birth, ID band Patient awake    Reviewed: Allergy & Precautions, H&P , NPO status , Patient's Chart, lab work & pertinent test results  History of Anesthesia Complications Negative for: history of anesthetic complications  Airway Mallampati: II      Dental   Pulmonary neg pulmonary ROS,  breath sounds clear to auscultation        Cardiovascular hypertension, Rhythm:Regular Rate:Normal     Neuro/Psych    GI/Hepatic   Endo/Other  Morbid obesity  Renal/GU Renal InsufficiencyRenal disease     Musculoskeletal  (+) Arthritis -,   Abdominal (+) + obese,   Peds  Hematology   Anesthesia Other Findings   Reproductive/Obstetrics                           Anesthesia Physical Anesthesia Plan  ASA: II  Anesthesia Plan: General   Post-op Pain Management:    Induction:   Airway Management Planned:   Additional Equipment:   Intra-op Plan:   Post-operative Plan: Extubation in OR  Informed Consent: I have reviewed the patients History and Physical, chart, labs and discussed the procedure including the risks, benefits and alternatives for the proposed anesthesia with the patient or authorized representative who has indicated his/her understanding and acceptance.     Plan Discussed with: CRNA  Anesthesia Plan Comments:         Anesthesia Quick Evaluation

## 2012-09-23 ENCOUNTER — Encounter (HOSPITAL_COMMUNITY): Payer: Self-pay | Admitting: Orthopedic Surgery

## 2012-09-23 LAB — URINE CULTURE
Colony Count: NO GROWTH
Culture: NO GROWTH

## 2012-09-23 LAB — BASIC METABOLIC PANEL
BUN: 27 mg/dL — ABNORMAL HIGH (ref 6–23)
Chloride: 103 mEq/L (ref 96–112)
Glucose, Bld: 129 mg/dL — ABNORMAL HIGH (ref 70–99)

## 2012-09-23 NOTE — Clinical Social Work Note (Addendum)
Clinical Social Work Department CLINICAL SOCIAL WORK PLACEMENT NOTE 09/23/2012  Patient:  [name], [name]  Account Number:  [id number] Admit date:  09/22/2012  Clinical Social Worker:  Trudie Buckler  Date/time:  09/23/2012 11:45 AM  Clinical Social Work is seeking post-discharge placement for this patient at the following level of care:   [facility]   (*CSW will update this form in Epic as items are completed)   09/23/2012  Patient/family provided with [place] Department of Clinical Social Work's list of facilities offering this level of care within the geographic area requested by the patient (or if unable, by the patient's family).  09/23/2012  Patient/family informed of their freedom to choose among providers that offer the needed level of care, that participate in Medicare, Medicaid or managed care program needed by the patient, have an available bed and are willing to accept the patient.  09/23/2012  Patient/family informed of [facility]' ownership interest in [facility], as well as of the fact that they are under no obligation to receive care at this facility.  PASARR submitted to EDS on 09/23/2012 PASARR number received from [employer] on 09/23/2012  FL2 transmitted to all facilities in geographic area requested by pt/family on  09/23/2012 FL2 transmitted to all facilities within larger geographic area on   Patient informed that his/her managed care company has contracts with or will negotiate with  certain facilities, including the following:     Patient/family informed of bed offers received:  09/23/2012 Patient chooses bed at [facility] Physician recommends and patient chooses bed at  [facility]  Patient to be transferred to [facility] 09/26/2012 Patient to be transferred to facility by [facility]  The following physician request were entered in Epic:   Additional Comments:  Janean Sark, [place] Social  Worker Contact #: [phone]

## 2012-09-23 NOTE — Progress Notes (Signed)
Seen and agreed 09/23/2012 Wenonah Milo Elizabeth PTA [phone] pager [phone] office    

## 2012-09-23 NOTE — Progress Notes (Signed)
Physical Therapy Treatment Patient Details Name: [name] MRN: [id number] DOB: [birth year] Today's Date: 09/23/2012 Time: [id number] PT Time Calculation (min): 32 min  PT Assessment / Plan / Recommendation Comments on Treatment Session  Pt s/p L TKA making steady progress towards PT goals. Increased activity tolerance with increased distance this session. Added exercises with only slight increase in pain. Will attempt SLR next visit and progress as tolerable.    Follow Up Recommendations  SNF     Does the patient have the potential to tolerate intense rehabilitation     Barriers to Discharge        Equipment Recommendations  None recommended by PT    Recommendations for Other Services    Frequency 7X/week   Plan Discharge plan remains appropriate;Frequency remains appropriate    Precautions / Restrictions Precautions Precautions: Knee Required Braces or Orthoses: Knee Immobilizer - Left Knee Immobilizer - Left: Discontinue once straight leg raise with < 10 degree lag   Pertinent Vitals/Pain 5/10 prior to treatment. 8/10 post treatment. Pt premedicated this am.    Mobility  Bed Mobility Supine to Sit: 4: Min assist Details for Bed Mobility Assistance: HOB slightly elevated;( A) to support LLE to floor Transfers Sit to Stand: 4: Min assist;From bed;With upper extremity assist Stand to Sit: 4: Min assist;With upper extremity assist;To chair/3-in-1 Details for Transfer Assistance: (A)  for stability to upright position. Cueing for hand placemenet and LLE positioning Ambulation/Gait Ambulation/Gait Assistance: 4: Min assist Ambulation Distance (Feet): 25 Feet Assistive device: Rolling walker Ambulation/Gait Assistance Details: Cueing for gait sequence. Able to maintain upright posture throughout with no cueing. Gait Pattern: Step-to pattern;Antalgic    Exercises Total Joint Exercises Ankle Circles/Pumps: AROM;Left;10 reps Quad Sets: AROM;Left;10 reps Short Arc Quad:  AROM;Left;10 reps Heel Slides: AROM;Left;10 reps;Limitations Heel Slides Limitations: limited by pain Hip ABduction/ADduction: AROM;Left;10 reps (8 reps due to increased pain)   PT Diagnosis:    PT Problem List:   PT Treatment Interventions:     PT Goals Acute Rehab PT Goals PT Goal: Supine/Side to Sit - Progress: Progressing toward goal PT Goal: Sit to Stand - Progress: Progressing toward goal PT Goal: Stand to Sit - Progress: Progressing toward goal PT Goal: Ambulate - Progress: Progressing toward goal PT Goal: Perform Home Exercise Program - Progress: Progressing toward goal  Visit Information  Last PT Received On: 09/23/12 Assistance Needed: +1    Subjective Data  Subjective: Im a little groggy   Cognition       Balance     End of Session PT - End of Session Equipment Utilized During Treatment: Gait belt;Left knee immobilizer Activity Tolerance: Patient tolerated treatment well Patient left: in chair;with call bell/phone within reach   GP     [name] 09/23/2012, 8:41 AM

## 2012-09-24 ENCOUNTER — Encounter (HOSPITAL_COMMUNITY): Payer: Self-pay | Admitting: General Practice

## 2012-09-24 LAB — BASIC METABOLIC PANEL
BUN: 26 mg/dL — ABNORMAL HIGH (ref 6–23)
CO2: 28 mEq/L (ref 19–32)
Chloride: 107 mEq/L (ref 96–112)
Creatinine, Ser: 1.57 mg/dL — ABNORMAL HIGH (ref 0.50–1.35)
GFR calc Af Amer: 48 mL/min — ABNORMAL LOW (ref >90)
Potassium: 4.6 mEq/L (ref 3.5–5.1)

## 2012-09-24 NOTE — Evaluation (Signed)
Occupational Therapy Evaluation Patient Details Name: [name] MRN: [id number] DOB: 1934/07/07 Today's Date: 09/24/2012 Time: [id number] OT Time Calculation (min): 21 min  OT Assessment / Plan / Recommendation Clinical Impression  Pt demos decline in function with LB ADLs, activity tolerance, balance and functional mobility safety following L knee surgery. Pt at min A level with LB ADLs and min A - min guard A with ADL/functional mobility. pt planning to d/c to SNF for short term rehab. All education completed and no further acute OT services needed at this time. OT will sign off    OT Assessment  All further OT needs can be met in the next venue of care    Follow Up Recommendations  SNF    Barriers to Discharge      Equipment Recommendations  Other (comment) (TBD at SNF level of care)    Recommendations for Other Services    Frequency       Precautions / Restrictions Precautions Precautions: Knee Required Braces or Orthoses: Knee Immobilizer - Left Knee Immobilizer - Left: Discontinue once straight leg raise with < 10 degree lag Restrictions Weight Bearing Restrictions: Yes LLE Weight Bearing: Weight bearing as tolerated   Pertinent Vitals/Pain     ADL  Grooming: Performed;Wash/dry hands;Wash/dry face;Supervision/safety;Set up Where Assessed - Grooming: Supported standing Upper Body Bathing: Simulated;Set up;Supervision/safety Lower Body Bathing: Simulated;Minimal assistance Upper Body Dressing: Performed;Supervision/safety;Set up Lower Body Dressing: Minimal assistance;Performed Toilet Transfer: Performed;Min guard Toilet Transfer Method: Sit to stand;Other (comment) (ambulating from RW level) Toilet Transfer Equipment: Raised toilet seat with arms (or 3-in-1 over toilet);Grab bars Toileting - Clothing Manipulation and Hygiene: Min guard Where Assessed - Toileting Clothing Manipulation and Hygiene: Standing ADL Comments: pt has reacher and long handled shoe horn  at home, reviewed remaining ADL A/E with pt    OT Diagnosis: Acute pain;Generalized weakness  OT Problem List: Decreased knowledge of use of DME or AE;Pain;Impaired balance (sitting and/or standing);Decreased activity tolerance OT Treatment Interventions:     OT Goals    Visit Information  Last OT Received On: 09/24/12    Subjective Data  Subjective: " I paln to go to [facility] for therapy after here " Patient Stated Goal: To return home after therapy at SNF   Prior [place] Lives With: Spouse Available Help at Discharge: Family Type of Home: [place] Access: Stairs to enter [employer] of Steps: 3 Entrance Stairs-Rails: None Home Layout: Two level;Able to live on main level with bedroom/bathroom Bathroom Shower/Tub: Multimedia programmer: Standard Bathroom Accessibility: Yes How Accessible: Accessible via walker Home Adaptive Equipment: Long-handled shoehorn;Built-in shower seat;Bedside commode/3-in-1;Walker - rolling;Reacher;Straight cane Prior Function Level of Independence: Independent Able to Take Stairs?: Yes Driving: Yes Vocation: Full time employment Communication Communication: No difficulties Dominant Hand: Right         Vision/Perception Vision - History Baseline Vision: Wears glasses only for reading Patient Visual Report: No change from baseline Vision - Assessment Eye Alignment: Within Functional Limits Perception Perception: Within Functional Limits   Cognition  Cognition Overall Cognitive Status: Appears within functional limits for tasks assessed/performed Arousal/Alertness: Awake/alert Orientation Level: Appears intact for tasks assessed Behavior During Session: [facility] for tasks performed    Extremity/Trunk Assessment Right Upper Extremity Assessment RUE ROM/Strength/Tone: [facility] for tasks assessed Left Upper Extremity Assessment LUE ROM/Strength/Tone: WFL for tasks assessed      Mobility Bed Mobility Bed Mobility: Not assessed Transfers Transfers: Sit to Stand;Stand to Sit Sit to Stand:  4: Min guard Stand to Sit: 4: Min guard     Exercise     Balance Balance Balance Assessed: No   End of Session OT - End of Session Equipment Utilized During Treatment: Gait belt;Left knee immobilizer (RW, 3 in 1, ADL A/E) Activity Tolerance: Patient tolerated treatment well Patient left: with family/visitor present;with call bell/phone within reach;in chair  GO     Britt Bottom 09/24/2012, 12:17 PM

## 2012-09-24 NOTE — Progress Notes (Signed)
Subjective: Doing well.  Pain controlled.     Objective: Vital signs in last 24 hours: Temp:  [98.4 F (36.9 C)-98.7 F (37.1 C)] 98.4 F (36.9 C) (02/12 0553) Pulse Rate:  [62-100] 100 (02/12 0553) Resp:  [16-18] 18 (02/12 0553) BP: (136-149)/(70-91) 149/91 mmHg (02/12 0553) SpO2:  [95 %-96 %] 96 % (02/12 0553)  Intake/Output from previous day: 02/11 0701 - 02/12 0700 In: 600 [P.O.:600] Out: 1500 [Urine:1500] Intake/Output this shift:     Recent Labs  09/22/12 1315  HGB 13.4    Recent Labs  09/22/12 1315  WBC 12.1*  RBC 4.31  HCT 39.2  PLT 144*    Recent Labs  09/23/12 0545 09/24/12 0550  NA 135 139  K 4.5 4.6  CL 103 107  CO2 22 28  BUN 27* 26*  CREATININE 1.48* 1.57*  GLUCOSE 129* 99  CALCIUM 8.6 9.0   No results found for this basename: LABPT, INR,  in the last 72 hours  Exam:  Wound looks good.  No drainage or signs of infection.  Calf nt, nvi.    Assessment/Plan: Transfer to [facility] tomorrow.     OWENS,JAMES M 09/24/2012, 8:23 AM

## 2012-09-24 NOTE — Plan of Care (Signed)
Problem: Phase III Progression Outcomes Goal: Discharge plan remains appropriate-arrangements made Pt plans to d/c to SNF for therapy after acute care d/c

## 2012-09-24 NOTE — Progress Notes (Signed)
Physical Therapy Treatment Patient Details Name: [name] MRN: [id number] DOB: 24-Dec-1933 Today's Date: 09/24/2012 Time: [id number] PT Time Calculation (min): 24 min  PT Assessment / Plan / Recommendation Comments on Treatment Session  Pt making steady progress towards PT goals. Will continue to increase activity as tolerated. Patient educated on mobility expectations. encouraged to ambulate with nsg. Pt educated on ROM and precautions.  Pt very receptive.    Follow Up Recommendations  SNF     Does the patient have the potential to tolerate intense rehabilitation     Barriers to Discharge        Equipment Recommendations  None recommended by PT    Recommendations for Other Services    Frequency 7X/week   Plan Discharge plan remains appropriate;Frequency remains appropriate    Precautions / Restrictions Precautions Precautions: Knee Required Braces or Orthoses: Knee Immobilizer - Left Knee Immobilizer - Left: Discontinue once straight leg raise with < 10 degree lag Restrictions Weight Bearing Restrictions: Yes LLE Weight Bearing: Weight bearing as tolerated   Pertinent Vitals/Pain 5/10    Mobility  Bed Mobility Bed Mobility: Not assessed Supine to Sit: 4: Min guard Details for Bed Mobility Assistance: HOB slightly elevated;( A) to support LLE to floor Transfers Transfers: Sit to Stand;Stand to Sit Sit to Stand: 5: Supervision Stand to Sit: 5: Supervision Details for Transfer Assistance: VC's for hand placement Ambulation/Gait Ambulation/Gait Assistance: 5: Supervision Ambulation/Gait: Patient Percentage: 70% Ambulation Distance (Feet): 160 Feet Assistive device: Rolling walker Ambulation/Gait Assistance Details: VC's for upright posture; pt steady with ambulation Gait Pattern: Step-through pattern;Antalgic Gait velocity: decreased    Exercises Total Joint Exercises Ankle Circles/Pumps: AROM;Left;10 reps Hip ABduction/ADduction:  (8 reps due to increased  pain)     PT Goals Acute Rehab PT Goals PT Goal Formulation: With patient/family Time For Goal Achievement: 09/29/12 Potential to Achieve Goals: Good Pt will go Supine/Side to Sit: with supervision Pt will go Sit to Supine/Side: with supervision PT Goal: Sit to Supine/Side - Progress: Progressing toward goal Pt will go Sit to Stand: with supervision PT Goal: Sit to Stand - Progress: Progressing toward goal Pt will go Stand to Sit: with supervision PT Goal: Stand to Sit - Progress: Progressing toward goal Pt will Ambulate: 51 - 150 feet;with supervision;with rolling walker PT Goal: Ambulate - Progress: Progressing toward goal Pt will Perform Home Exercise Program: with supervision, verbal cues required/provided  Visit Information  Last PT Received On: 09/24/12 Assistance Needed: +1    Subjective Data  Subjective: Let get this going Patient Stated Goal: I want to get up if I can   Cognition  Cognition Overall Cognitive Status: Appears within functional limits for tasks assessed/performed Arousal/Alertness: Awake/alert Orientation Level: Appears intact for tasks assessed Behavior During Session: [facility] for tasks performed    Balance  Balance Balance Assessed: No  End of Session PT - End of Session Equipment Utilized During Treatment: Gait belt;Left knee immobilizer Activity Tolerance: Patient tolerated treatment well Patient left: in bed;in CPM;with family/visitor present Nurse Communication: Mobility status   GP     Duncan Dull 09/24/2012, 3:10 PM Alben Deeds, [place] DPT  [phone]

## 2012-09-25 LAB — BASIC METABOLIC PANEL
CO2: 25 mEq/L (ref 19–32)
Calcium: 8.8 mg/dL (ref 8.4–10.5)
Chloride: 104 mEq/L (ref 96–112)
Creatinine, Ser: 1.6 mg/dL — ABNORMAL HIGH (ref 0.50–1.35)
Glucose, Bld: 88 mg/dL (ref 70–99)
Sodium: 140 mEq/L (ref 135–145)

## 2012-09-25 MED ORDER — BISACODYL 10 MG RE SUPP
10.0000 mg | Freq: Once | RECTAL | Status: AC
  Administered 2012-09-25: 10 mg via RECTAL
  Filled 2012-09-25: qty 1

## 2012-09-25 NOTE — Clinical Social Work Note (Signed)
CSW Department was notified of patient's readiness to be transferred to [facility] this afternoon.  Per [facility]'s Admissions Coordinator, that facility is not accepting patients today due to weather conditions, and due to weather other facilities are closing early this pm and not accepting additional transfers.  Will plan for transfer in am.  Daphene Calamity, ACSW, LCSW Director, Clinical Social Work

## 2012-09-25 NOTE — Progress Notes (Signed)
Patient ID: [name], male   DOB: 1934/05/16, 79 y.o.   MRN: [id number]   Spoke with caroline rn today and advised that dr Noemi Chapel and myself would not be rounding today due to inclimate weather.  States that he has not had a bowel movement yet.  Pos flatus.  i offered patient a dulcolax supp or mag citrate yesterday but he declined.  Per rn, he is not c/o abd pain.  Advised to offer supp again.  Awaiting transfer to [facility].  nicole rn will let me know if d/c summary needed today.

## 2012-09-25 NOTE — Progress Notes (Signed)
Physical Therapy Treatment Patient Details Name: [name] MRN: [id number] DOB: 1934-05-14 Today's Date: 09/25/2012 Time: [id number] PT Time Calculation (min): 24 min  PT Assessment / Plan / Recommendation Comments on Treatment Session  Patient continuing to make good progress. Able to tolerate increased gait with limited fatique. Awaiting SNF placement at this time    Follow Up Recommendations  SNF     Does the patient have the potential to tolerate intense rehabilitation     Barriers to Discharge        Equipment Recommendations  None recommended by PT    Recommendations for Other Services    Frequency 7X/week   Plan Discharge plan remains appropriate;Frequency remains appropriate    Precautions / Restrictions Precautions Precautions: Knee Knee Immobilizer - Left: Discontinue once straight leg raise with < 10 degree lag Restrictions Weight Bearing Restrictions: Yes LLE Weight Bearing: Weight bearing as tolerated   Pertinent Vitals/Pain     Mobility  Bed Mobility Bed Mobility: Not assessed Transfers Sit to Stand: 5: Supervision Stand to Sit: 5: Supervision Details for Transfer Assistance: VC's for hand placement Ambulation/Gait Ambulation/Gait Assistance: 5: Supervision Ambulation Distance (Feet): 300 Feet Assistive device: Rolling walker Ambulation/Gait Assistance Details: Patient progressing with ambulation. Did have one episode of knee bucking with increased fatique Gait Pattern: Step-through pattern;Antalgic    Exercises Total Joint Exercises Ankle Circles/Pumps: AROM;Left;10 reps Quad Sets: AROM;Left;10 reps Short Arc Quad: AROM;Left;10 reps Heel Slides: AROM;Left;10 reps;Limitations   PT Diagnosis:    PT Problem List:   PT Treatment Interventions:     PT Goals Acute Rehab PT Goals PT Goal: Sit to Stand - Progress: Met PT Goal: Stand to Sit - Progress: Met PT Goal: Ambulate - Progress: Progressing toward goal PT Goal: Perform Home Exercise  Program - Progress: Progressing toward goal  Visit Information  Last PT Received On: 09/25/12 Assistance Needed: +1    Subjective Data      Cognition  Cognition Overall Cognitive Status: Appears within functional limits for tasks assessed/performed Arousal/Alertness: Awake/alert Orientation Level: Appears intact for tasks assessed Behavior During Session: [facility] for tasks performed    Balance     End of Session PT - End of Session Equipment Utilized During Treatment: Gait belt Activity Tolerance: Patient tolerated treatment well Patient left: in chair Nurse Communication: Mobility status CPM Left Knee CPM Left Knee: Off   GP     Jacqualyn Posey 09/25/2012, 11:39 AM 09/25/2012 Jacqualyn Posey PTA [phone] pager [phone] office

## 2012-09-26 DIAGNOSIS — S8990XA Unspecified injury of unspecified lower leg, initial encounter: Secondary | ICD-10-CM | POA: Diagnosis not present

## 2012-09-26 DIAGNOSIS — Z96659 Presence of unspecified artificial knee joint: Secondary | ICD-10-CM | POA: Diagnosis not present

## 2012-09-26 DIAGNOSIS — Z471 Aftercare following joint replacement surgery: Secondary | ICD-10-CM | POA: Diagnosis not present

## 2012-09-26 DIAGNOSIS — I150 Renovascular hypertension: Secondary | ICD-10-CM | POA: Diagnosis not present

## 2012-09-26 DIAGNOSIS — I10 Essential (primary) hypertension: Secondary | ICD-10-CM | POA: Diagnosis not present

## 2012-09-26 DIAGNOSIS — N184 Chronic kidney disease, stage 4 (severe): Secondary | ICD-10-CM | POA: Diagnosis not present

## 2012-09-26 DIAGNOSIS — M109 Gout, unspecified: Secondary | ICD-10-CM | POA: Diagnosis not present

## 2012-09-26 DIAGNOSIS — R262 Difficulty in walking, not elsewhere classified: Secondary | ICD-10-CM | POA: Diagnosis not present

## 2012-09-26 DIAGNOSIS — M1990 Unspecified osteoarthritis, unspecified site: Secondary | ICD-10-CM | POA: Diagnosis not present

## 2012-09-26 DIAGNOSIS — M6281 Muscle weakness (generalized): Secondary | ICD-10-CM | POA: Diagnosis not present

## 2012-09-26 DIAGNOSIS — M25569 Pain in unspecified knee: Secondary | ICD-10-CM | POA: Diagnosis not present

## 2012-09-26 DIAGNOSIS — E785 Hyperlipidemia, unspecified: Secondary | ICD-10-CM | POA: Diagnosis not present

## 2012-09-26 MED ORDER — OXYCODONE HCL 5 MG PO TABS: ORAL_TABLET | ORAL | Status: DC

## 2012-09-26 MED ORDER — ACETAMINOPHEN 325 MG PO TABS: 650.0000 mg | ORAL_TABLET | Freq: Four times a day (QID) | ORAL | Status: DC | PRN

## 2012-09-26 MED ORDER — OXYCODONE HCL ER 20 MG PO T12A: 20.0000 mg | EXTENDED_RELEASE_TABLET | Freq: Two times a day (BID) | ORAL | Status: DC

## 2012-09-26 MED ORDER — ENOXAPARIN SODIUM 30 MG/0.3ML ~~LOC~~ SOLN: 30.0000 mg | Freq: Two times a day (BID) | SUBCUTANEOUS | Status: DC

## 2012-09-26 MED ORDER — OXYCODONE HCL ER 10 MG PO T12A
20.0000 mg | EXTENDED_RELEASE_TABLET | Freq: Two times a day (BID) | ORAL | Status: DC
  Administered 2012-09-26: 20 mg via ORAL
  Filled 2012-09-26: qty 2

## 2012-09-26 MED ORDER — BISACODYL 5 MG PO TBEC: DELAYED_RELEASE_TABLET | ORAL | Status: DC

## 2012-09-26 MED ORDER — ZOLPIDEM TARTRATE 5 MG PO TABS: 5.0000 mg | ORAL_TABLET | Freq: Every evening | ORAL | Status: DC | PRN

## 2012-09-26 MED ORDER — DSS 100 MG PO CAPS: ORAL_CAPSULE | ORAL | Status: DC

## 2012-09-26 NOTE — Clinical Social Work Note (Signed)
CSW was consulted to complete discharge of patient. Pt to transfer to [facility] today via [place]. Facility and family are aware of d/c. D/C packet complete with chart copy, signed FL2, and signed hard Rx.  CSW signing off as no other CSW needs identified at this time.  Janean Sark, [facility] Worker Contact #: [phone]

## 2012-09-26 NOTE — Progress Notes (Signed)
Utilization review completed. Shawan Corella, RN, BSN. 

## 2012-09-26 NOTE — Discharge Summary (Signed)
Patient ID: FREEDOM HUR MRN: [id number] DOB/AGE: [birth year] 79 y.o.  Admit date: 09/22/2012 Discharge date: 09/26/2012  Admission Diagnoses:  Principal Problem:   Left knee DJD Active Problems:   HYPERLIPIDEMIA   HYPERTENSION   KIDNEY STONE   PSA, INCREASED   Gout   E-coli UTI   Chronic kidney disease, stage IV (severe)   Discharge Diagnoses:  Same  Past Medical History  Diagnosis Date  . Hyperlipidemia   . Hypertension   . PSA, INCREASED 12/05/2009  . KIDNEY STONE 12/02/2008  . Bladder stones   . UTI (urinary tract infection)   . Arthritis   . Chronic kidney disease     Surgeries: Procedure(s): TOTAL KNEE ARTHROPLASTY on 09/22/2012   Consultants:    Discharged Condition: Improved  Hospital Course: [name] is an 79 y.o. male who was admitted 09/22/2012 for operative treatment ofLeft knee DJD. Patient has severe unremitting pain that affects sleep, daily activities, and work/hobbies. After pre-op clearance the patient was taken to the operating room on 09/22/2012 and underwent  Procedure(s): TOTAL KNEE ARTHROPLASTY.    Patient was given perioperative antibiotics:     Anti-infectives   Start     Dose/Rate Route Frequency Ordered Stop   09/22/12 1900  vancomycin (VANCOCIN) IVPB 1000 mg/200 mL premix     1,000 mg 200 mL/hr over 60 Minutes Intravenous Every 12 hours 09/22/12 1158 09/22/12 2057   09/22/12 0837  tobramycin (NEBCIN) powder  Status:  Discontinued       As needed 09/22/12 0757 09/22/12 0934   09/21/12 1125  vancomycin (VANCOCIN) 1,500 mg in sodium chloride 0.9 % 500 mL IVPB  Status:  Discontinued     1,500 mg 250 mL/hr over 120 Minutes Intravenous On call to O.R. 09/21/12 1125 09/22/12 1158       Patient was given sequential compression devices, early ambulation, and chemoprophylaxis to prevent DVT.  Patient has had some increased pain since decadron last dose of decadron on Wednesday.  Unable to take NSAIDS due to renal disease.  Will add  Oxycontin 20 mg BID to his medications.   Patient benefited maximally from hospital stay and there were no complications.    Recent vital signs:  Patient Vitals for the past 24 hrs:  BP Temp Temp src Pulse Resp SpO2  09/26/12 0643 141/74 mmHg - - - - -  09/26/12 0545 130/67 mmHg 98.4 F (36.9 C) - 75 16 96 %  09/25/12 2108 124/63 mmHg 98.1 F (36.7 C) - 60 16 96 %  09/25/12 1530 - - - - 12 96 %  09/25/12 1400 130/68 mmHg 98.3 F (36.8 C) Oral 65 18 96 %  09/25/12 0800 - - - - 12 96 %     Recent laboratory studies:   Recent Labs  09/24/12 0550 09/25/12 0640  NA 139 140  K 4.6 4.3  CL 107 104  CO2 28 25  BUN 26* 30*  CREATININE 1.57* 1.60*  GLUCOSE 99 88  CALCIUM 9.0 8.8     Discharge Medications:     Medication List    STOP taking these medications       ADVIL PM 200-25 MG Caps  Generic drug:  Ibuprofen-Diphenhydramine HCl     aspirin 81 MG tablet     tadalafil 20 MG tablet  Commonly known as:  CIALIS     TYLENOL PM EXTRA STRENGTH PO      TAKE these medications       acetaminophen 325 MG tablet  Commonly known as:  TYLENOL  Take 2 tablets (650 mg total) by mouth every 6 (six) hours as needed for pain or fever.     allopurinol 300 MG tablet  Commonly known as:  ZYLOPRIM  Take 300 mg by mouth daily.     bisacodyl 5 MG EC tablet  Commonly known as:  DULCOLAX  Take 2 tablets every night with dinner until bowel movement.  LAXITIVE.  Restart if two days since last bowel movement     DSS 100 MG Caps  1 tab 2 times a day while on narcotics.  STOOL SOFTENER     enoxaparin 30 MG/0.3ML injection  Commonly known as:  LOVENOX  Inject 0.3 mLs (30 mg total) into the skin every 12 (twelve) hours.     losartan 50 MG tablet  Commonly known as:  COZAAR  Take 50 mg by mouth 2 (two) times daily.     oxyCODONE 5 MG immediate release tablet  Commonly known as:  Oxy IR/ROXICODONE  1-2 tablets every 4-6 hrs as needed for pain     OxyCODONE 20 mg T12a  Commonly  known as:  OXYCONTIN  Take 1 tablet (20 mg total) by mouth every 12 (twelve) hours.     simvastatin 40 MG tablet  Commonly known as:  ZOCOR  Take 40 mg by mouth at bedtime.     Tamsulosin HCl 0.4 MG Caps  Commonly known as:  FLOMAX  Take 0.4 mg by mouth daily after supper.     zolpidem 5 MG tablet  Commonly known as:  AMBIEN  Take 1 tablet (5 mg total) by mouth at bedtime as needed for sleep.        Diagnostic Studies: Dg Chest 2 View  09/15/2012  *RADIOLOGY REPORT*  Clinical Data: Preoperative assessment for knee surgery, history hypertension  CHEST - 2 VIEW  Comparison: 12/29/2002  Findings: Enlargement of cardiac silhouette. Mediastinal contours and pulmonary vascularity normal. Bowel interposition between liver and diaphragm again noted. Eventration anterior right diaphragm noted. Right basilar atelectasis. Remaining lungs clear. No pleural effusion, pneumothorax or acute osseous findings.  IMPRESSION: Eventration of anterior right diaphragm with right basilar atelectasis. Enlargement of cardiac silhouette.   Original Report Authenticated By: Lavonia Dana, M.D.     Disposition:   Discharge Orders   Future Orders Complete By Expires     CPM  As directed     Comments:      Continuous passive motion machine (CPM):      Use the CPM from 0 to 90 for 6 hours per day.       You may break it up into 2 or 3 sessions per day.      Use CPM for 2 weeks or until you are told to stop.    Call MD / Call 911  As directed     Comments:      If you experience chest pain or shortness of breath, CALL 911 and be transported to the hospital emergency room.  If you develope a fever above 101 F, pus (white drainage) or increased drainage or redness at the wound, or calf pain, call your surgeon's office.    Change dressing  As directed     Comments:      Change the dressing daily with sterile 4 x 4 inch gauze dressing and apply TED hose.  You may clean the incision with alcohol prior to redressing.     Constipation Prevention  As directed  Comments:      Drink plenty of fluids.  Prune juice may be helpful.  You may use a stool softener, such as Colace (over the counter) 100 mg twice a day.  Use MiraLax (over the counter) for constipation as needed.    Diet - low sodium heart healthy  As directed     Discharge instructions  As directed     Comments:      Total Knee Replacement Care After Refer to this sheet in the next few weeks. These discharge instructions provide you with general information on caring for yourself after you leave the hospital. Your caregiver may also give you specific instructions. Your treatment has been planned according to the most current medical practices available, but unavoidable complications sometimes occur. If you have any problems or questions after discharge, please call your caregiver. Regaining a near full range of motion of your knee within the first 3 to 6 weeks after surgery is critical. [place] may resume a normal diet and activities as directed.  Perform exercises as directed.  Place yellow foam block, yellow side up under heel at all times except when in CPM or when walking.  DO NOT modify, tear, cut, or change in any way. You will receive physical therapy daily  Take showers instead of baths until informed otherwise.  Change bandages (dressings)daily Do not take over-the-counter or prescription medicines for pain, discomfort, or fever. Eat a well-balanced diet.  Avoid lifting or driving until you are instructed otherwise.  Make an appointment to see your caregiver for stitches (suture) or staple removal as directed.  If you have been sent home with a continuous passive motion machine (CPM machine), 0-90 degrees 6 hrs a day   2 hrs a shift SEEK MEDICAL CARE IF: You have swelling of your calf or leg.  You develop shortness of breath or chest pain.  You have redness, swelling, or increasing pain in the wound.  There is pus or any  unusual drainage coming from the surgical site.  You notice a bad smell coming from the surgical site or dressing.  The surgical site breaks open after sutures or staples have been removed.  There is persistent bleeding from the suture or staple line.  You are getting worse or are not improving.  You have any other questions or concerns.  SEEK IMMEDIATE MEDICAL CARE IF:  You have a fever.  You develop a rash.  You have difficulty breathing.  You develop any reaction or side effects to medicines given.  Your knee motion is decreasing rather than improving.  MAKE SURE YOU:  Understand these instructions.  Will watch your condition.  Will get help right away if you are not doing well or get worse.    Do not put a pillow under the knee. Place it under the heel.  As directed     Comments:      Place yellow foam block, yellow side up under heel at all times except when in CPM or when walking.  DO NOT modify, tear, cut, or change in any way the yellow foam block.    Increase activity slowly as tolerated  As directed     TED hose  As directed     Comments:      Use stockings (TED hose) for 2 weeks on both leg(s).  You may remove them at night for sleeping.       Follow-up Information   Follow up with [place],  MD On 10/06/2012. (appt time 3:45)    Contact information:   [facility] [id number] [phone]        Signed: Linda Hedges 09/26/2012, 7:15 AM

## 2012-10-01 DIAGNOSIS — M109 Gout, unspecified: Secondary | ICD-10-CM | POA: Diagnosis not present

## 2012-10-01 DIAGNOSIS — I150 Renovascular hypertension: Secondary | ICD-10-CM | POA: Diagnosis not present

## 2012-10-01 DIAGNOSIS — N184 Chronic kidney disease, stage 4 (severe): Secondary | ICD-10-CM | POA: Diagnosis not present

## 2012-10-09 ENCOUNTER — Other Ambulatory Visit: Payer: Self-pay | Admitting: Internal Medicine

## 2012-10-14 DIAGNOSIS — M25569 Pain in unspecified knee: Secondary | ICD-10-CM | POA: Diagnosis not present

## 2012-10-14 DIAGNOSIS — R269 Unspecified abnormalities of gait and mobility: Secondary | ICD-10-CM | POA: Diagnosis not present

## 2012-10-14 DIAGNOSIS — M25669 Stiffness of unspecified knee, not elsewhere classified: Secondary | ICD-10-CM | POA: Diagnosis not present

## 2012-10-15 DIAGNOSIS — M25569 Pain in unspecified knee: Secondary | ICD-10-CM | POA: Diagnosis not present

## 2012-10-15 DIAGNOSIS — M25669 Stiffness of unspecified knee, not elsewhere classified: Secondary | ICD-10-CM | POA: Diagnosis not present

## 2012-10-15 DIAGNOSIS — R269 Unspecified abnormalities of gait and mobility: Secondary | ICD-10-CM | POA: Diagnosis not present

## 2012-10-16 ENCOUNTER — Telehealth: Payer: Self-pay | Admitting: Internal Medicine

## 2012-10-16 NOTE — Telephone Encounter (Signed)
Ok to resume per Dr Leanne Chang, pt aware

## 2012-10-16 NOTE — Telephone Encounter (Signed)
June:  Question regarding: Simvastatin, not taking since knee surgery, 09/22/12, wants to know when to start taking again? Please call

## 2012-10-20 DIAGNOSIS — M25669 Stiffness of unspecified knee, not elsewhere classified: Secondary | ICD-10-CM | POA: Diagnosis not present

## 2012-10-20 DIAGNOSIS — M25569 Pain in unspecified knee: Secondary | ICD-10-CM | POA: Diagnosis not present

## 2012-10-20 DIAGNOSIS — R269 Unspecified abnormalities of gait and mobility: Secondary | ICD-10-CM | POA: Diagnosis not present

## 2012-10-22 DIAGNOSIS — M25669 Stiffness of unspecified knee, not elsewhere classified: Secondary | ICD-10-CM | POA: Diagnosis not present

## 2012-10-22 DIAGNOSIS — R269 Unspecified abnormalities of gait and mobility: Secondary | ICD-10-CM | POA: Diagnosis not present

## 2012-10-22 DIAGNOSIS — M25569 Pain in unspecified knee: Secondary | ICD-10-CM | POA: Diagnosis not present

## 2012-10-24 ENCOUNTER — Encounter: Payer: Self-pay | Admitting: Family

## 2012-10-24 ENCOUNTER — Ambulatory Visit (INDEPENDENT_AMBULATORY_CARE_PROVIDER_SITE_OTHER): Payer: Medicare Other | Admitting: Family

## 2012-10-24 VITALS — BP 110/60 | HR 86 | Temp 97.9°F | Wt 254.0 lb

## 2012-10-24 DIAGNOSIS — R630 Anorexia: Secondary | ICD-10-CM | POA: Diagnosis not present

## 2012-10-24 DIAGNOSIS — M25569 Pain in unspecified knee: Secondary | ICD-10-CM | POA: Diagnosis not present

## 2012-10-24 DIAGNOSIS — M25669 Stiffness of unspecified knee, not elsewhere classified: Secondary | ICD-10-CM | POA: Diagnosis not present

## 2012-10-24 DIAGNOSIS — R6883 Chills (without fever): Secondary | ICD-10-CM | POA: Diagnosis not present

## 2012-10-24 DIAGNOSIS — R269 Unspecified abnormalities of gait and mobility: Secondary | ICD-10-CM | POA: Diagnosis not present

## 2012-10-24 LAB — CBC WITH DIFFERENTIAL/PLATELET
Basophils Relative: 0.3 % (ref 0.0–3.0)
Eosinophils Absolute: 0.2 10*3/uL (ref 0.0–0.7)
Hemoglobin: 12.5 g/dL — ABNORMAL LOW (ref 13.0–17.0)
MCHC: 33.5 g/dL (ref 30.0–36.0)
MCV: 89.9 fl (ref 78.0–100.0)
Monocytes Absolute: 0.8 10*3/uL (ref 0.1–1.0)
Neutro Abs: 7.1 10*3/uL (ref 1.4–7.7)
Neutrophils Relative %: 69.2 % (ref 43.0–77.0)
RBC: 4.15 Mil/uL — ABNORMAL LOW (ref 4.22–5.81)
RDW: 14.1 % (ref 11.5–14.6)

## 2012-10-24 LAB — POCT URINALYSIS DIPSTICK
Spec Grav, UA: 1.02
Urobilinogen, UA: 0.2

## 2012-10-24 NOTE — Progress Notes (Signed)
  Subjective:    Patient ID: [name], male    DOB: Jan 13, 1934, 79 y.o.   MRN: [id number]  HPI 79 year old white male, nonsmoker, patient of Dr. Leanne Chang is in today with complaints of chills ongoing since he had knee replacement surgery last month. Denies any fever. Reports intermittent shortness of breath at times has been ongoing x1 week. Has decreased appetite. But denies any chest pain, nausea or vomiting. No urinary frequency or urgency. However, he had a difficult to treat urinary tract infection in December 2013 that required 2 rounds of antibiotics. Next office visit with orthopedics is in 2 weeks.   Review of Systems  Constitutional: Positive for chills. Negative for fever.  Respiratory: Negative.   Cardiovascular: Negative.   Gastrointestinal: Negative.   Genitourinary: Negative.  Negative for dysuria and frequency.  Musculoskeletal: Negative.   Allergic/Immunologic: Negative.   Neurological: Negative.   Hematological: Negative.   Psychiatric/Behavioral: Negative.        Objective:   Physical Exam  Constitutional: He is oriented to person, place, and time. He appears well-developed and well-nourished.  Neck: Normal range of motion. Neck supple.  Cardiovascular: Normal rate, regular rhythm and normal heart sounds.   Pulmonary/Chest: Effort normal and breath sounds normal.  Abdominal: Soft. Bowel sounds are normal. There is no tenderness. There is no rebound and no guarding.  Musculoskeletal: Normal range of motion.  Neurological: He is alert and oriented to person, place, and time.  Skin: Skin is warm and dry.  Psychiatric: He has a normal mood and affect.          Assessment & Plan:  Assessment:  1. Chills 2. Decreased appetite  Plan: TSH, CBC, UA, chest x-ray all orders. We'll follow with patient and the results. Encourage him to call the office in the meantime if his symptoms worsen or persist. Recheck a schedule, and as needed.

## 2012-10-27 ENCOUNTER — Telehealth: Payer: Self-pay | Admitting: Internal Medicine

## 2012-10-27 DIAGNOSIS — M25669 Stiffness of unspecified knee, not elsewhere classified: Secondary | ICD-10-CM | POA: Diagnosis not present

## 2012-10-27 DIAGNOSIS — R269 Unspecified abnormalities of gait and mobility: Secondary | ICD-10-CM | POA: Diagnosis not present

## 2012-10-27 NOTE — Telephone Encounter (Signed)
Consider depression.Richard KitchenMarland KitchenCould this be an issue?

## 2012-10-27 NOTE — Telephone Encounter (Signed)
Pt would like blood work results °

## 2012-10-27 NOTE — Telephone Encounter (Signed)
Pt aware labs stable.   He c/o having no enthusiasm and having trouble concentrating on tasks such as reading a book. Pt's wife states that he is SOB also. Please advise

## 2012-10-28 NOTE — Telephone Encounter (Signed)
He thinks depression may be an issue due to his recent retirement and knee surgery. 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

## 2012-10-29 DIAGNOSIS — M25569 Pain in unspecified knee: Secondary | ICD-10-CM | POA: Diagnosis not present

## 2012-10-29 DIAGNOSIS — M25669 Stiffness of unspecified knee, not elsewhere classified: Secondary | ICD-10-CM | POA: Diagnosis not present

## 2012-10-29 DIAGNOSIS — R269 Unspecified abnormalities of gait and mobility: Secondary | ICD-10-CM | POA: Diagnosis not present

## 2012-10-31 DIAGNOSIS — M25669 Stiffness of unspecified knee, not elsewhere classified: Secondary | ICD-10-CM | POA: Diagnosis not present

## 2012-10-31 DIAGNOSIS — R269 Unspecified abnormalities of gait and mobility: Secondary | ICD-10-CM | POA: Diagnosis not present

## 2012-10-31 DIAGNOSIS — M25569 Pain in unspecified knee: Secondary | ICD-10-CM | POA: Diagnosis not present

## 2012-11-03 DIAGNOSIS — R269 Unspecified abnormalities of gait and mobility: Secondary | ICD-10-CM | POA: Diagnosis not present

## 2012-11-03 DIAGNOSIS — M25669 Stiffness of unspecified knee, not elsewhere classified: Secondary | ICD-10-CM | POA: Diagnosis not present

## 2012-11-03 DIAGNOSIS — M25569 Pain in unspecified knee: Secondary | ICD-10-CM | POA: Diagnosis not present

## 2012-11-05 DIAGNOSIS — M25569 Pain in unspecified knee: Secondary | ICD-10-CM | POA: Diagnosis not present

## 2012-11-05 DIAGNOSIS — R269 Unspecified abnormalities of gait and mobility: Secondary | ICD-10-CM | POA: Diagnosis not present

## 2012-11-05 DIAGNOSIS — M25669 Stiffness of unspecified knee, not elsewhere classified: Secondary | ICD-10-CM | POA: Diagnosis not present

## 2012-11-06 ENCOUNTER — Other Ambulatory Visit: Payer: Self-pay | Admitting: Geriatric Medicine

## 2012-11-06 ENCOUNTER — Telehealth: Payer: Self-pay | Admitting: Internal Medicine

## 2012-11-06 MED ORDER — LOSARTAN POTASSIUM 50 MG PO TABS: 50.0000 mg | ORAL_TABLET | Freq: Two times a day (BID) | ORAL | Status: DC

## 2012-11-06 MED ORDER — ZOLPIDEM TARTRATE 5 MG PO TABS: 5.0000 mg | ORAL_TABLET | Freq: Every evening | ORAL | Status: DC | PRN

## 2012-11-06 NOTE — Telephone Encounter (Signed)
Pt called and stated that he would like his losartan (COZAAR) 50 MG tablet sent for [facility]ill [facility]. Please assist.

## 2012-11-06 NOTE — Telephone Encounter (Signed)
rx sent in electronically 

## 2012-11-07 DIAGNOSIS — M25669 Stiffness of unspecified knee, not elsewhere classified: Secondary | ICD-10-CM | POA: Diagnosis not present

## 2012-11-07 DIAGNOSIS — M25569 Pain in unspecified knee: Secondary | ICD-10-CM | POA: Diagnosis not present

## 2012-11-07 DIAGNOSIS — R269 Unspecified abnormalities of gait and mobility: Secondary | ICD-10-CM | POA: Diagnosis not present

## 2012-11-10 ENCOUNTER — Telehealth: Payer: Self-pay | Admitting: Internal Medicine

## 2012-11-10 ENCOUNTER — Other Ambulatory Visit: Payer: Self-pay | Admitting: *Deleted

## 2012-11-10 MED ORDER — LOSARTAN POTASSIUM 50 MG PO TABS: 50.0000 mg | ORAL_TABLET | Freq: Two times a day (BID) | ORAL | Status: DC

## 2012-11-10 NOTE — Telephone Encounter (Signed)
Patient called stating that he will be out of his losartan 50 mg 1po bid before his mail order come in and will need to [facility] [address] [facility] [place]. Please assist.

## 2012-11-10 NOTE — Telephone Encounter (Signed)
10 day supply sent in to [facility]

## 2012-11-11 DIAGNOSIS — M25669 Stiffness of unspecified knee, not elsewhere classified: Secondary | ICD-10-CM | POA: Diagnosis not present

## 2012-11-11 DIAGNOSIS — M25569 Pain in unspecified knee: Secondary | ICD-10-CM | POA: Diagnosis not present

## 2012-11-11 DIAGNOSIS — R269 Unspecified abnormalities of gait and mobility: Secondary | ICD-10-CM | POA: Diagnosis not present

## 2012-11-14 DIAGNOSIS — M25669 Stiffness of unspecified knee, not elsewhere classified: Secondary | ICD-10-CM | POA: Diagnosis not present

## 2012-11-14 DIAGNOSIS — M25569 Pain in unspecified knee: Secondary | ICD-10-CM | POA: Diagnosis not present

## 2012-11-14 DIAGNOSIS — R269 Unspecified abnormalities of gait and mobility: Secondary | ICD-10-CM | POA: Diagnosis not present

## 2012-11-17 DIAGNOSIS — M25569 Pain in unspecified knee: Secondary | ICD-10-CM | POA: Diagnosis not present

## 2012-11-17 DIAGNOSIS — R269 Unspecified abnormalities of gait and mobility: Secondary | ICD-10-CM | POA: Diagnosis not present

## 2012-11-17 DIAGNOSIS — M25669 Stiffness of unspecified knee, not elsewhere classified: Secondary | ICD-10-CM | POA: Diagnosis not present

## 2012-11-19 DIAGNOSIS — M25569 Pain in unspecified knee: Secondary | ICD-10-CM | POA: Diagnosis not present

## 2012-11-19 DIAGNOSIS — R269 Unspecified abnormalities of gait and mobility: Secondary | ICD-10-CM | POA: Diagnosis not present

## 2012-11-19 DIAGNOSIS — M25669 Stiffness of unspecified knee, not elsewhere classified: Secondary | ICD-10-CM | POA: Diagnosis not present

## 2012-11-24 DIAGNOSIS — R269 Unspecified abnormalities of gait and mobility: Secondary | ICD-10-CM | POA: Diagnosis not present

## 2012-11-24 DIAGNOSIS — M25569 Pain in unspecified knee: Secondary | ICD-10-CM | POA: Diagnosis not present

## 2012-11-24 DIAGNOSIS — M25669 Stiffness of unspecified knee, not elsewhere classified: Secondary | ICD-10-CM | POA: Diagnosis not present

## 2012-11-26 DIAGNOSIS — M25569 Pain in unspecified knee: Secondary | ICD-10-CM | POA: Diagnosis not present

## 2012-11-26 DIAGNOSIS — R269 Unspecified abnormalities of gait and mobility: Secondary | ICD-10-CM | POA: Diagnosis not present

## 2012-11-26 DIAGNOSIS — M25669 Stiffness of unspecified knee, not elsewhere classified: Secondary | ICD-10-CM | POA: Diagnosis not present

## 2012-12-04 ENCOUNTER — Other Ambulatory Visit: Payer: Self-pay | Admitting: Internal Medicine

## 2012-12-04 DIAGNOSIS — M25669 Stiffness of unspecified knee, not elsewhere classified: Secondary | ICD-10-CM | POA: Diagnosis not present

## 2012-12-04 DIAGNOSIS — M25569 Pain in unspecified knee: Secondary | ICD-10-CM | POA: Diagnosis not present

## 2012-12-04 DIAGNOSIS — R269 Unspecified abnormalities of gait and mobility: Secondary | ICD-10-CM | POA: Diagnosis not present

## 2012-12-09 DIAGNOSIS — R269 Unspecified abnormalities of gait and mobility: Secondary | ICD-10-CM | POA: Diagnosis not present

## 2012-12-09 DIAGNOSIS — M25569 Pain in unspecified knee: Secondary | ICD-10-CM | POA: Diagnosis not present

## 2012-12-09 DIAGNOSIS — M25669 Stiffness of unspecified knee, not elsewhere classified: Secondary | ICD-10-CM | POA: Diagnosis not present

## 2012-12-11 DIAGNOSIS — M25569 Pain in unspecified knee: Secondary | ICD-10-CM | POA: Diagnosis not present

## 2012-12-11 DIAGNOSIS — M25669 Stiffness of unspecified knee, not elsewhere classified: Secondary | ICD-10-CM | POA: Diagnosis not present

## 2012-12-11 DIAGNOSIS — R269 Unspecified abnormalities of gait and mobility: Secondary | ICD-10-CM | POA: Diagnosis not present

## 2012-12-15 ENCOUNTER — Other Ambulatory Visit: Payer: Self-pay | Admitting: Internal Medicine

## 2012-12-15 DIAGNOSIS — R269 Unspecified abnormalities of gait and mobility: Secondary | ICD-10-CM | POA: Diagnosis not present

## 2012-12-15 DIAGNOSIS — M25669 Stiffness of unspecified knee, not elsewhere classified: Secondary | ICD-10-CM | POA: Diagnosis not present

## 2012-12-15 DIAGNOSIS — M25569 Pain in unspecified knee: Secondary | ICD-10-CM | POA: Diagnosis not present

## 2012-12-17 DIAGNOSIS — R269 Unspecified abnormalities of gait and mobility: Secondary | ICD-10-CM | POA: Diagnosis not present

## 2012-12-17 DIAGNOSIS — M25669 Stiffness of unspecified knee, not elsewhere classified: Secondary | ICD-10-CM | POA: Diagnosis not present

## 2012-12-17 DIAGNOSIS — M25569 Pain in unspecified knee: Secondary | ICD-10-CM | POA: Diagnosis not present

## 2013-01-19 ENCOUNTER — Other Ambulatory Visit: Payer: Self-pay | Admitting: Internal Medicine

## 2013-01-30 ENCOUNTER — Other Ambulatory Visit: Payer: Self-pay | Admitting: Internal Medicine

## 2013-02-02 DIAGNOSIS — Z471 Aftercare following joint replacement surgery: Secondary | ICD-10-CM | POA: Diagnosis not present

## 2013-02-02 DIAGNOSIS — M545 Low back pain: Secondary | ICD-10-CM | POA: Diagnosis not present

## 2013-02-02 DIAGNOSIS — Z96659 Presence of unspecified artificial knee joint: Secondary | ICD-10-CM | POA: Diagnosis not present

## 2013-02-22 ENCOUNTER — Other Ambulatory Visit: Payer: Self-pay | Admitting: Internal Medicine

## 2013-02-23 ENCOUNTER — Telehealth: Payer: Self-pay | Admitting: Internal Medicine

## 2013-02-23 MED ORDER — LOSARTAN POTASSIUM 50 MG PO TABS: ORAL_TABLET | ORAL | Status: DC

## 2013-02-23 NOTE — Telephone Encounter (Signed)
Pt needs a 30 day new RX losartan (COZAAR) 50 MG tablet sent to [facility] [address] [place], [place] [address] [phone] [place] out of meds and his order from [facility] will not get there.

## 2013-02-23 NOTE — Telephone Encounter (Signed)
rx sent in electronically 

## 2013-03-11 ENCOUNTER — Telehealth: Payer: Self-pay | Admitting: Internal Medicine

## 2013-03-11 MED ORDER — LOSARTAN POTASSIUM 50 MG PO TABS: ORAL_TABLET | ORAL | Status: DC

## 2013-03-11 NOTE — Telephone Encounter (Signed)
rx sent in electronically 

## 2013-03-11 NOTE — Telephone Encounter (Addendum)
PT called and stated that he left his losartan (COZAAR) 50 MG tablet [place]. He will be residing in [place] for the next 7 days, and would like a 7 day supply. Please assist.

## 2013-03-12 MED ORDER — LOSARTAN POTASSIUM 50 MG PO TABS: ORAL_TABLET | ORAL | Status: DC

## 2013-03-12 NOTE — Telephone Encounter (Signed)
PT states that he needs another RX for 8 pills, because he takes 2 a day. Please call these into the [facility] [address] [place]. Please assist

## 2013-03-12 NOTE — Telephone Encounter (Signed)
My mistake, rx sent in electronically

## 2013-03-12 NOTE — Addendum Note (Signed)
Addended by: Townsend Roger D on: 03/12/2013 10:59 AM   Modules accepted: Orders

## 2013-03-16 DIAGNOSIS — M545 Low back pain: Secondary | ICD-10-CM | POA: Diagnosis not present

## 2013-04-16 ENCOUNTER — Telehealth: Payer: Self-pay | Admitting: *Deleted

## 2013-04-16 ENCOUNTER — Telehealth: Payer: Self-pay | Admitting: Internal Medicine

## 2013-04-16 NOTE — Telephone Encounter (Signed)
RN was returning a call to pt.  No answer.  RN left a message on voicemail

## 2013-04-16 NOTE — Telephone Encounter (Signed)
Patient has been taking losartan 50 twice daily.  He has now lost 50 pounds, his blood pressure readings are around 116/60, and he is having some dizzy spells.  Please advise.

## 2013-04-17 NOTE — Telephone Encounter (Signed)
Decrease once daily- Continue to monitor BP

## 2013-04-17 NOTE — Telephone Encounter (Signed)
Left Dr Leanne Chang advice on pts cell phone voicemail

## 2013-04-27 DIAGNOSIS — N2581 Secondary hyperparathyroidism of renal origin: Secondary | ICD-10-CM | POA: Diagnosis not present

## 2013-04-27 DIAGNOSIS — I10 Essential (primary) hypertension: Secondary | ICD-10-CM | POA: Diagnosis not present

## 2013-04-27 DIAGNOSIS — N183 Chronic kidney disease, stage 3 unspecified: Secondary | ICD-10-CM | POA: Diagnosis not present

## 2013-04-27 DIAGNOSIS — I129 Hypertensive chronic kidney disease with stage 1 through stage 4 chronic kidney disease, or unspecified chronic kidney disease: Secondary | ICD-10-CM | POA: Diagnosis not present

## 2013-04-27 DIAGNOSIS — D649 Anemia, unspecified: Secondary | ICD-10-CM | POA: Diagnosis not present

## 2013-04-27 DIAGNOSIS — E213 Hyperparathyroidism, unspecified: Secondary | ICD-10-CM | POA: Diagnosis not present

## 2013-04-27 DIAGNOSIS — M109 Gout, unspecified: Secondary | ICD-10-CM | POA: Diagnosis not present

## 2013-05-12 DIAGNOSIS — Z23 Encounter for immunization: Secondary | ICD-10-CM | POA: Diagnosis not present

## 2013-09-04 DIAGNOSIS — N139 Obstructive and reflux uropathy, unspecified: Secondary | ICD-10-CM | POA: Diagnosis not present

## 2013-09-04 DIAGNOSIS — N138 Other obstructive and reflux uropathy: Secondary | ICD-10-CM | POA: Diagnosis not present

## 2013-09-04 DIAGNOSIS — N529 Male erectile dysfunction, unspecified: Secondary | ICD-10-CM | POA: Diagnosis not present

## 2013-09-04 DIAGNOSIS — R339 Retention of urine, unspecified: Secondary | ICD-10-CM | POA: Diagnosis not present

## 2013-09-04 DIAGNOSIS — N401 Enlarged prostate with lower urinary tract symptoms: Secondary | ICD-10-CM | POA: Diagnosis not present

## 2013-09-14 ENCOUNTER — Other Ambulatory Visit: Payer: Self-pay | Admitting: Internal Medicine

## 2013-10-20 ENCOUNTER — Other Ambulatory Visit: Payer: Self-pay | Admitting: Internal Medicine

## 2013-10-27 ENCOUNTER — Ambulatory Visit (INDEPENDENT_AMBULATORY_CARE_PROVIDER_SITE_OTHER): Payer: Medicare Other | Admitting: Family

## 2013-10-27 ENCOUNTER — Encounter: Payer: Self-pay | Admitting: Family

## 2013-10-27 VITALS — BP 100/60 | HR 51 | Temp 98.0°F | Ht 72.0 in | Wt 238.0 lb

## 2013-10-27 DIAGNOSIS — R42 Dizziness and giddiness: Secondary | ICD-10-CM

## 2013-10-27 DIAGNOSIS — I959 Hypotension, unspecified: Secondary | ICD-10-CM

## 2013-10-27 NOTE — Progress Notes (Signed)
Pre visit review using our clinic review tool, if applicable. No additional management support is needed unless otherwise documented below in the visit note. 

## 2013-10-27 NOTE — Progress Notes (Signed)
Subjective:    Patient ID: [name], male    DOB: 1934/03/09, 80 y.o.   MRN: [id number]  Dizziness   80 year old white male, nonsmoker is in today with complaints of hypotension and dizziness ongoing x2 weeks. He is currently taking losartan 12.5 mg twice a day. He's had a 50 pound weight loss after having surgery last year and his blood pressures to run lower denies any chest pain, palpitations, shortness of breath or edema.   Review of Systems  Constitutional: Negative.   HENT: Negative.   Respiratory: Negative.   Cardiovascular: Negative.   Gastrointestinal: Negative.   Endocrine: Negative.   Genitourinary: Negative.   Musculoskeletal: Negative.   Skin: Negative.   Neurological: Positive for dizziness.  Hematological: Negative.   Psychiatric/Behavioral: Negative.    Past Medical History  Diagnosis Date  . Hyperlipidemia   . Hypertension   . PSA, INCREASED 12/05/2009  . KIDNEY STONE 12/02/2008  . Bladder stones   . UTI (urinary tract infection)   . Arthritis   . Chronic kidney disease     History   Social History  . Marital Status: Married    Spouse Name: N/A    Number of Children: N/A  . Years of Education: N/A   Occupational History  . Not on file.   Social History Main Topics  . Smoking status: Never Smoker   . Smokeless tobacco: Never Used  . Alcohol Use: 4.2 oz/week    7 Glasses of wine per week  . Drug Use: No  . Sexual Activity: Not on file   Other Topics Concern  . Not on file   Social History Narrative  . No narrative on file    Past Surgical History  Procedure Laterality Date  . Cholecystectomy    . Pilonidal cyst excision  58  . Cystoscopy w/ stone manipulation    . Eye surgery      bil cat  . Total knee arthroplasty Left 09/22/2012    Procedure: TOTAL KNEE ARTHROPLASTY;  Surgeon: Lorn Junes, MD;  Location: [facility];  Service: Orthopedics;  Laterality: Left;    Family History  Problem Relation Age of Onset  . Heart attack  Father     Allergies  Allergen Reactions  . Epinephrine Palpitations    "Rate races"  . Lisinopril     REACTION: cough  . Penicillins     REACTION: hives    Current Outpatient Prescriptions on File Prior to Visit  Medication Sig Dispense Refill  . losartan (COZAAR) 50 MG tablet TAKE 1 TABLET TWICE A DAY  8 tablet  0  . simvastatin (ZOCOR) 40 MG tablet TAKE 1 TABLET AT BEDTIME  90 tablet  1  . Tamsulosin HCl (FLOMAX) 0.4 MG CAPS Take 0.4 mg by mouth daily after supper.      [name] ZYLOPRIM 300 MG tablet TAKE 1 TABLET BY MOUTH DAILY  90 tablet  2  . acetaminophen (TYLENOL) 325 MG tablet Take 2 tablets (650 mg total) by mouth every 6 (six) hours as needed for pain or fever.      . bisacodyl (DULCOLAX) 5 MG EC tablet Take 2 tablets every night with dinner until bowel movement.  LAXITIVE.  Restart if two days since last bowel movement  30 tablet    . docusate sodium 100 MG CAPS 1 tab 2 times a day while on narcotics.  STOOL SOFTENER  10 capsule    . enoxaparin (LOVENOX) 30 MG/0.3ML injection Inject 0.3 mLs (30  mg total) into the skin every 12 (twelve) hours.  20 Syringe  0  . oxyCODONE (OXY IR/ROXICODONE) 5 MG immediate release tablet 1-2 tablets every 4-6 hrs as needed for pain  100 tablet  0  . OxyCODONE (OXYCONTIN) 20 mg T12A Take 1 tablet (20 mg total) by mouth every 12 (twelve) hours.  60 tablet  0  . zolpidem (AMBIEN) 5 MG tablet Take 1 tablet (5 mg total) by mouth at bedtime as needed for sleep.  30 tablet  0   No current facility-administered medications on file prior to visit.    BP 100/60  Pulse 51  Temp(Src) 98 F (36.7 C) (Oral)  Ht 6' (1.829 m)  Wt 238 lb (107.956 kg)  BMI 32.27 kg/m2chart    Objective:   Physical Exam  Constitutional: He is oriented to person, place, and time. He appears well-developed and well-nourished.  HENT:  Right Ear: External ear normal.  Left Ear: External ear normal.  Nose: Nose normal.  Mouth/Throat: Oropharynx is clear and moist.  Neck:  Normal range of motion. Neck supple.  Cardiovascular: Normal rate, regular rhythm and normal heart sounds.   Pulmonary/Chest: Effort normal and breath sounds normal.  Abdominal: Soft. Bowel sounds are normal.  Musculoskeletal: Normal range of motion.  Neurological: He is alert and oriented to person, place, and time.  Skin: Skin is warm and dry.  Psychiatric: He has a normal mood and affect.          Assessment & Plan:  Coal was seen today for dizziness.  Diagnoses and associated orders for this visit:  Hypotension, unspecified  Dizziness and giddiness    decrease Losartan to 12.5mg  once daily recheck in 2 weeks.

## 2013-10-27 NOTE — Patient Instructions (Signed)
1. Decrease Cozaar to 12.5mg  once a day only.  2. Monitor BP daily.  3. Recheck in 2 weeks.   Hypotension As your heart beats, it forces blood through your arteries. This force is your blood pressure. If your blood pressure is too low for you to go about your normal activities or to support the organs of your body, you have hypotension. Hypotension is also referred to as low blood pressure. When your blood pressure becomes too low, you may not get enough blood to your brain. As a result, you may feel weak, feel lightheaded, or develop a rapid heart rate. In a more severe case, you may faint. CAUSES Various conditions can cause hypotension. These include:  Blood loss.  Dehydration.  Heart or endocrine problems.  Pregnancy.  Severe infection.  Not having a well-balanced diet filled with needed nutrients.  Severe allergic reactions (anaphylaxis). Some medicines, such as blood pressure medicine or water pills (diuretics), may lower your blood pressure below normal. Sometimes taking too much medicine or taking medicine not as directed can cause hypotension. TREATMENT  Hospitalization is sometimes required for hypotension if fluid or blood replacement is needed, if time is needed for medicines to wear off, or if further monitoring is needed. Treatment might include changing your diet, changing your medicines (including medicines aimed at raising your blood pressure), and use of support stockings. HOME CARE INSTRUCTIONS   Drink enough fluids to keep your urine clear or pale yellow.  Take your medicines as directed by your health care provider.  Get up slowly from reclining or sitting positions. This gives your blood pressure a chance to adjust.  Wear support stockings as directed by your health care provider.  Maintain a healthy diet by including nutritious food, such as fruits, vegetables, nuts, whole grains, and lean meats. SEEK MEDICAL CARE IF:  You have vomiting or diarrhea.  You  have a fever for more than 2 3 days.  You feel more thirsty than usual.  You feel weak and tired. SEEK IMMEDIATE MEDICAL CARE IF:   You have chest pain or a fast or irregular heartbeat.  You have a loss of feeling in some part of your body, or you lose movement in your arms or legs.  You have trouble speaking.  You become sweaty or feel lightheaded.  You faint. MAKE SURE YOU:   Understand these instructions.  Will watch your condition.  Will get help right away if you are not doing well or get worse. Document Released: 07/30/2005 Document Revised: 05/20/2013 Document Reviewed: 01/30/2013 [facility] Patient Information 2014 [place].

## 2013-11-10 ENCOUNTER — Telehealth: Payer: Self-pay | Admitting: Internal Medicine

## 2013-11-10 MED ORDER — LOSARTAN POTASSIUM 50 MG PO TABS: ORAL_TABLET | ORAL | Status: DC

## 2013-11-10 MED ORDER — SIMVASTATIN 40 MG PO TABS: ORAL_TABLET | ORAL | Status: DC

## 2013-11-10 NOTE — Addendum Note (Signed)
Addended by: Townsend Roger D on: 11/10/2013 09:16 AM   Modules accepted: Orders

## 2013-11-12 MED ORDER — SIMVASTATIN 40 MG PO TABS: ORAL_TABLET | ORAL | Status: DC

## 2013-11-12 NOTE — Telephone Encounter (Signed)
Pt needs new rx simvastatin 40 mg #30 sent to [facility] [place]. Pt is out of town

## 2013-12-01 ENCOUNTER — Ambulatory Visit (INDEPENDENT_AMBULATORY_CARE_PROVIDER_SITE_OTHER): Payer: Medicare Other | Admitting: Family

## 2013-12-01 ENCOUNTER — Encounter: Payer: Self-pay | Admitting: Family

## 2013-12-01 VITALS — BP 98/60 | HR 47 | Temp 98.0°F | Ht 72.0 in | Wt 240.0 lb

## 2013-12-01 DIAGNOSIS — I10 Essential (primary) hypertension: Secondary | ICD-10-CM

## 2013-12-01 DIAGNOSIS — D631 Anemia in chronic kidney disease: Secondary | ICD-10-CM | POA: Diagnosis not present

## 2013-12-01 DIAGNOSIS — M109 Gout, unspecified: Secondary | ICD-10-CM | POA: Diagnosis not present

## 2013-12-01 DIAGNOSIS — E7800 Pure hypercholesterolemia, unspecified: Secondary | ICD-10-CM

## 2013-12-01 DIAGNOSIS — N2581 Secondary hyperparathyroidism of renal origin: Secondary | ICD-10-CM | POA: Diagnosis not present

## 2013-12-01 DIAGNOSIS — N183 Chronic kidney disease, stage 3 unspecified: Secondary | ICD-10-CM | POA: Diagnosis not present

## 2013-12-01 DIAGNOSIS — I129 Hypertensive chronic kidney disease with stage 1 through stage 4 chronic kidney disease, or unspecified chronic kidney disease: Secondary | ICD-10-CM | POA: Diagnosis not present

## 2013-12-01 NOTE — Patient Instructions (Signed)
Sodium-Controlled Diet Sodium is a mineral. It is found in many foods. Sodium may be found naturally or added during the making of a food. The most common form of sodium is salt, which is made up of sodium and chloride. Reducing your sodium intake involves changing your eating habits. The following guidelines will help you reduce the sodium in your diet:  Stop using the salt shaker.  Use salt sparingly in cooking and baking.  Substitute with sodium-free seasonings and spices.  Do not use a salt substitute (potassium chloride) without your caregiver's permission.  Include a variety of fresh, unprocessed foods in your diet.  Limit the use of processed and convenience foods that are high in sodium. USE THE FOLLOWING FOODS SPARINGLY: Breads/Starches  Commercial bread stuffing, commercial pancake or waffle mixes, coating mixes. Waffles. Croutons. Prepared (boxed or frozen) potato, rice, or noodle mixes that contain salt or sodium. Salted French fries or hash browns. Salted popcorn, breads, crackers, chips, or snack foods. Vegetables  Vegetables canned with salt or prepared in cream, butter, or cheese sauces. Sauerkraut. Tomato or vegetable juices canned with salt.  Fresh vegetables are allowed if rinsed thoroughly. Fruit  Fruit is okay to eat. Meat and Meat Substitutes  Salted or smoked meats, such as bacon or Canadian bacon, chipped or corned beef, hot dogs, salt pork, luncheon meats, pastrami, ham, or sausage. Canned or smoked fish, poultry, or meat. Processed cheese or cheese spreads, blue or Roquefort cheese. Battered or frozen fish products. Prepared spaghetti sauce. Baked beans. Reuben sandwiches. Salted nuts. Caviar. Milk  Limit buttermilk to 1 cup per week. Soups and Combination Foods  Bouillon cubes, canned or dried soups, broth, consomm. Convenience (frozen or packaged) dinners with more than 600 mg sodium. Pot pies, pizza, Asian food, fast food cheeseburgers, and specialty  sandwiches. Desserts and Sweets  Regular (salted) desserts, pie, commercial fruit snack pies, commercial snack cakes, canned puddings.  Eat desserts and sweets in moderation. Fats and Oils  Gravy mixes or canned gravy. No more than 1 to 2 tbs of salad dressing. Chip dips.  Eat fats and oils in moderation. Beverages  See those listed under the vegetables and milk groups. Condiments  Ketchup, mustard, meat sauces, salsa, regular (salted) and lite soy sauce or mustard. Dill pickles, olives, meat tenderizer. Prepared horseradish or pickle relish. Dutch-processed cocoa. Baking powder or baking soda used medicinally. Worcestershire sauce. "Light" salt. Salt substitute, unless approved by your caregiver. Document Released: 01/19/2002 Document Revised: 10/22/2011 Document Reviewed: 08/22/2009 [employer] Patient Information 2014 [employer].  

## 2013-12-01 NOTE — Progress Notes (Signed)
Subjective:    Patient ID: [name], male    DOB: 01-11-1934, 80 y.o.   MRN: [id number]  HPI 80 year old white male, nonsmoker is in today for recheck of hypertension, hyperlipidemia, gout, and chronic kidney disease. Denies any concerns today. Blood pressures have been ranging 0000000 systolically. He reports typically have a swelling in his left ankle has been a chronic problem that's been worked up by vascular and was found to be idiopathic. He wears a support hose that helps.   Review of Systems  Constitutional: Negative.   HENT: Negative.   Respiratory: Negative.   Cardiovascular: Positive for leg swelling. Negative for chest pain and palpitations.       Chronic left ankle swelling.  Gastrointestinal: Negative.   Endocrine: Negative.   Genitourinary: Negative.   Musculoskeletal: Negative.   Neurological: Negative.   Psychiatric/Behavioral: Negative.    Past Medical History  Diagnosis Date  . Hyperlipidemia   . Hypertension   . PSA, INCREASED 12/05/2009  . KIDNEY STONE 12/02/2008  . Bladder stones   . UTI (urinary tract infection)   . Arthritis   . Chronic kidney disease     History   Social History  . Marital Status: Married    Spouse Name: N/A    Number of Children: N/A  . Years of Education: N/A   Occupational History  . Not on file.   Social History Main Topics  . Smoking status: Never Smoker   . Smokeless tobacco: Never Used  . Alcohol Use: 4.2 oz/week    7 Glasses of wine per week  . Drug Use: No  . Sexual Activity: Not on file   Other Topics Concern  . Not on file   Social History Narrative  . No narrative on file    Past Surgical History  Procedure Laterality Date  . Cholecystectomy    . Pilonidal cyst excision  58  . Cystoscopy w/ stone manipulation    . Eye surgery      bil cat  . Total knee arthroplasty Left 09/22/2012    Procedure: TOTAL KNEE ARTHROPLASTY;  Surgeon: Richard Junes, MD;  [facility]: [name];  Service: Orthopedics;   Laterality: Left;    Family History  Problem Relation Age of Onset  . Heart attack Father     Allergies  Allergen Reactions  . Epinephrine Palpitations    "Rate races"  . Lisinopril     REACTION: cough  . Penicillins     REACTION: hives    Current Outpatient Prescriptions on File Prior to Visit  Medication Sig Dispense Refill  . acetaminophen (TYLENOL) 325 MG tablet Take 2 tablets (650 mg total) by mouth every 6 (six) hours as needed for pain or fever.      [name] aspirin 81 MG tablet Take 81 mg by mouth daily.      . bisacodyl (DULCOLAX) 5 MG EC tablet Take 2 tablets every night with dinner until bowel movement.  LAXITIVE.  Restart if two days since last bowel movement  30 tablet    . docusate sodium 100 MG CAPS 1 tab 2 times a day while on narcotics.  STOOL SOFTENER  10 capsule    . enoxaparin (LOVENOX) 30 MG/0.3ML injection Inject 0.3 mLs (30 mg total) into the skin every 12 (twelve) hours.  20 Syringe  0  . losartan (COZAAR) 50 MG tablet TAKE 1 TABLET TWICE A DAY  180 tablet  0  . oxyCODONE (OXY IR/ROXICODONE) 5 MG immediate release tablet  1-2 tablets every 4-6 hrs as needed for pain  100 tablet  0  . OxyCODONE (OXYCONTIN) 20 mg T12A Take 1 tablet (20 mg total) by mouth every 12 (twelve) hours.  60 tablet  0  . simvastatin (ZOCOR) 40 MG tablet TAKE 1 TABLET AT BEDTIME  30 tablet  0  . Tamsulosin HCl (FLOMAX) 0.4 MG CAPS Take 0.4 mg by mouth daily after supper.      . zolpidem (AMBIEN) 5 MG tablet Take 1 tablet (5 mg total) by mouth at bedtime as needed for sleep.  30 tablet  0  . ZYLOPRIM 300 MG tablet TAKE 1 TABLET BY MOUTH DAILY  90 tablet  2   No current facility-administered medications on file prior to visit.    BP 98/60  Pulse 47  Temp(Src) 98 F (36.7 C) (Oral)  Ht 6' (1.829 m)  Wt 240 lb (108.863 kg)  BMI 32.54 kg/m2chart    Objective:   Physical Exam  Constitutional: He is oriented to person, place, and time. He appears well-developed and well-nourished.  HENT:    Right Ear: External ear normal.  Left Ear: External ear normal.  Nose: Nose normal.  Mouth/Throat: Oropharynx is clear and moist.  Neck: Normal range of motion. Neck supple.  Cardiovascular: Normal rate, regular rhythm and normal heart sounds.   Pulmonary/Chest: Effort normal and breath sounds normal.  Abdominal: Bowel sounds are normal.  Musculoskeletal: He exhibits no edema and no tenderness.  Neurological: He is alert and oriented to person, place, and time.  Skin: Skin is warm and dry.  Psychiatric: He has a normal mood and affect.          Assessment & Plan:  [name] was seen today for follow-up.  Diagnoses and associated orders for this visit:  Pure hypercholesterolemia  Unspecified essential hypertension  Gout    call the office with any questions or concerns. Recheck for complete physical exam in 4 weeks and sooner if needed.

## 2013-12-01 NOTE — Progress Notes (Signed)
Pre visit review using our clinic review tool, if applicable. No additional management support is needed unless otherwise documented below in the visit note. 

## 2013-12-15 DIAGNOSIS — N183 Chronic kidney disease, stage 3 unspecified: Secondary | ICD-10-CM | POA: Diagnosis not present

## 2013-12-21 ENCOUNTER — Other Ambulatory Visit: Payer: Medicare Other

## 2013-12-23 ENCOUNTER — Other Ambulatory Visit (INDEPENDENT_AMBULATORY_CARE_PROVIDER_SITE_OTHER): Payer: Medicare Other

## 2013-12-23 DIAGNOSIS — R972 Elevated prostate specific antigen [PSA]: Secondary | ICD-10-CM | POA: Diagnosis not present

## 2013-12-23 DIAGNOSIS — I10 Essential (primary) hypertension: Secondary | ICD-10-CM | POA: Diagnosis not present

## 2013-12-23 DIAGNOSIS — Z0000 Encounter for general adult medical examination without abnormal findings: Secondary | ICD-10-CM | POA: Diagnosis not present

## 2013-12-23 DIAGNOSIS — E785 Hyperlipidemia, unspecified: Secondary | ICD-10-CM

## 2013-12-23 LAB — BASIC METABOLIC PANEL
BUN: 42 mg/dL — ABNORMAL HIGH (ref 6–23)
CHLORIDE: 108 meq/L (ref 96–112)
CO2: 26 mEq/L (ref 19–32)
Calcium: 10 mg/dL (ref 8.4–10.5)
Creatinine, Ser: 1.9 mg/dL — ABNORMAL HIGH (ref 0.4–1.5)
GFR: 36.84 mL/min — AB (ref >60.00)
Glucose, Bld: 89 mg/dL (ref 70–99)
POTASSIUM: 4.8 meq/L (ref 3.5–5.1)
SODIUM: 140 meq/L (ref 135–145)

## 2013-12-23 LAB — POCT URINALYSIS DIPSTICK
Bilirubin, UA: NEGATIVE
GLUCOSE UA: NEGATIVE
Ketones, UA: NEGATIVE
Leukocytes, UA: NEGATIVE
NITRITE UA: NEGATIVE
PROTEIN UA: NEGATIVE
RBC UA: NEGATIVE
SPEC GRAV UA: 1.02
UROBILINOGEN UA: 0.2
pH, UA: 5

## 2013-12-23 LAB — CBC WITH DIFFERENTIAL/PLATELET
BASOS PCT: 0.5 % (ref 0.0–3.0)
Basophils Absolute: 0 10*3/uL (ref 0.0–0.1)
EOS PCT: 5.4 % — AB (ref 0.0–5.0)
Eosinophils Absolute: 0.4 10*3/uL (ref 0.0–0.7)
HEMATOCRIT: 44.5 % (ref 39.0–52.0)
HEMOGLOBIN: 15.1 g/dL (ref 13.0–17.0)
LYMPHS ABS: 2.1 10*3/uL (ref 0.7–4.0)
Lymphocytes Relative: 26.7 % (ref 12.0–46.0)
MCHC: 34 g/dL (ref 30.0–36.0)
MCV: 93 fl (ref 78.0–100.0)
MONO ABS: 0.6 10*3/uL (ref 0.1–1.0)
MONOS PCT: 8.2 % (ref 3.0–12.0)
Neutro Abs: 4.7 10*3/uL (ref 1.4–7.7)
Neutrophils Relative %: 59.2 % (ref 43.0–77.0)
PLATELETS: 175 10*3/uL (ref 150.0–400.0)
RBC: 4.79 Mil/uL (ref 4.22–5.81)
RDW: 14 % (ref 11.5–15.5)
WBC: 7.9 10*3/uL (ref 4.0–10.5)

## 2013-12-23 LAB — HEPATIC FUNCTION PANEL
ALBUMIN: 4.1 g/dL (ref 3.5–5.2)
ALK PHOS: 79 U/L (ref 39–117)
ALT: 27 U/L (ref 0–53)
AST: 29 U/L (ref 0–37)
Bilirubin, Direct: 0.1 mg/dL (ref 0.0–0.3)
TOTAL PROTEIN: 6.5 g/dL (ref 6.0–8.3)
Total Bilirubin: 1 mg/dL (ref 0.2–1.2)

## 2013-12-23 LAB — LIPID PANEL
CHOL/HDL RATIO: 4
CHOLESTEROL: 147 mg/dL (ref 0–200)
HDL: 39.9 mg/dL (ref >39.00)
LDL CALC: 95 mg/dL (ref 0–99)
TRIGLYCERIDES: 63 mg/dL (ref 0.0–149.0)
VLDL: 12.6 mg/dL (ref 0.0–40.0)

## 2013-12-23 LAB — PSA: PSA: 4.98 ng/mL — ABNORMAL HIGH (ref 0.10–4.00)

## 2013-12-23 LAB — TSH: TSH: 1.82 u[IU]/mL (ref 0.35–4.50)

## 2013-12-28 ENCOUNTER — Ambulatory Visit (INDEPENDENT_AMBULATORY_CARE_PROVIDER_SITE_OTHER): Payer: Medicare Other | Admitting: Family

## 2013-12-28 ENCOUNTER — Encounter: Payer: Self-pay | Admitting: Family

## 2013-12-28 VITALS — BP 102/68 | HR 58 | Ht 72.5 in | Wt 237.0 lb

## 2013-12-28 DIAGNOSIS — Z23 Encounter for immunization: Secondary | ICD-10-CM

## 2013-12-28 DIAGNOSIS — E785 Hyperlipidemia, unspecified: Secondary | ICD-10-CM | POA: Diagnosis not present

## 2013-12-28 DIAGNOSIS — I10 Essential (primary) hypertension: Secondary | ICD-10-CM

## 2013-12-28 DIAGNOSIS — N400 Enlarged prostate without lower urinary tract symptoms: Secondary | ICD-10-CM

## 2013-12-28 DIAGNOSIS — Z0000 Encounter for general adult medical examination without abnormal findings: Secondary | ICD-10-CM | POA: Diagnosis not present

## 2013-12-28 NOTE — Progress Notes (Signed)
Pre visit review using our clinic review tool, if applicable. No additional management support is needed unless otherwise documented below in the visit note. 

## 2013-12-28 NOTE — Patient Instructions (Signed)
Sodium-Controlled Diet Sodium is a mineral. It is found in many foods. Sodium may be found naturally or added during the making of a food. The most common form of sodium is salt, which is made up of sodium and chloride. Reducing your sodium intake involves changing your eating habits. The following guidelines will help you reduce the sodium in your diet:  Stop using the salt shaker.  Use salt sparingly in cooking and baking.  Substitute with sodium-free seasonings and spices.  Do not use a salt substitute (potassium chloride) without your caregiver's permission.  Include a variety of fresh, unprocessed foods in your diet.  Limit the use of processed and convenience foods that are high in sodium. USE THE FOLLOWING FOODS SPARINGLY: Breads/Starches  Commercial bread stuffing, commercial pancake or waffle mixes, coating mixes. Waffles. Croutons. Prepared (boxed or frozen) potato, rice, or noodle mixes that contain salt or sodium. Salted French fries or hash browns. Salted popcorn, breads, crackers, chips, or snack foods. Vegetables  Vegetables canned with salt or prepared in cream, butter, or cheese sauces. Sauerkraut. Tomato or vegetable juices canned with salt.  Fresh vegetables are allowed if rinsed thoroughly. Fruit  Fruit is okay to eat. Meat and Meat Substitutes  Salted or smoked meats, such as bacon or Canadian bacon, chipped or corned beef, hot dogs, salt pork, luncheon meats, pastrami, ham, or sausage. Canned or smoked fish, poultry, or meat. Processed cheese or cheese spreads, blue or Roquefort cheese. Battered or frozen fish products. Prepared spaghetti sauce. Baked beans. Reuben sandwiches. Salted nuts. Caviar. Milk  Limit buttermilk to 1 cup per week. Soups and Combination Foods  Bouillon cubes, canned or dried soups, broth, consomm. Convenience (frozen or packaged) dinners with more than 600 mg sodium. Pot pies, pizza, Asian food, fast food cheeseburgers, and specialty  sandwiches. Desserts and Sweets  Regular (salted) desserts, pie, commercial fruit snack pies, commercial snack cakes, canned puddings.  Eat desserts and sweets in moderation. Fats and Oils  Gravy mixes or canned gravy. No more than 1 to 2 tbs of salad dressing. Chip dips.  Eat fats and oils in moderation. Beverages  See those listed under the vegetables and milk groups. Condiments  Ketchup, mustard, meat sauces, salsa, regular (salted) and lite soy sauce or mustard. Dill pickles, olives, meat tenderizer. Prepared horseradish or pickle relish. Dutch-processed cocoa. Baking powder or baking soda used medicinally. Worcestershire sauce. "Light" salt. Salt substitute, unless approved by your caregiver. Document Released: 01/19/2002 Document Revised: 10/22/2011 Document Reviewed: 08/22/2009 [employer] Patient Information 2014 [employer].  

## 2013-12-28 NOTE — Progress Notes (Signed)
Subjective:    Patient ID: [name], male    DOB: 01/29/1934, 80 y.o.   MRN: [id number]  HPI 80 year old WM, nonsmoker, veteran, Patient presents for yearly preventative medicine examination. Medicare questionnaire was completed  All immunizations and health maintenance protocols were reviewed with the patient and needed orders were placed.  Appropriate screening laboratory values were ordered for the patient including screening of hyperlipidemia, renal function and hepatic function. If indicated by BPH, a PSA was ordered.  Medication reconciliation,  past medical history, social history, problem list and allergies were reviewed in detail with the patient  Goals were established with regard to weight loss, exercise, and  diet in compliance with medications  End of life planning was discussed.    Review of Systems  Constitutional: Negative.   HENT: Negative.   Eyes: Negative.   Respiratory: Negative.   Cardiovascular: Negative.   Gastrointestinal: Negative.   Endocrine: Negative.   Genitourinary: Negative.   Musculoskeletal: Negative.   Allergic/Immunologic: Negative.   Neurological: Negative.   Hematological: Negative.   Psychiatric/Behavioral: Negative.    Past Medical History  Diagnosis Date  . Hyperlipidemia   . Hypertension   . PSA, INCREASED 12/05/2009  . KIDNEY STONE 12/02/2008  . Bladder stones   . UTI (urinary tract infection)   . Arthritis   . Chronic kidney disease     History   Social History  . Marital Status: Married    Spouse Name: N/A    Number of Children: N/A  . Years of Education: N/A   Occupational History  . Not on file.   Social History Main Topics  . Smoking status: Never Smoker   . Smokeless tobacco: Never Used  . Alcohol Use: 4.2 oz/week    7 Glasses of wine per week  . Drug Use: No  . Sexual Activity: Not on file   Other Topics Concern  . Not on file   Social History Narrative  . No narrative on file    Past  Surgical History  Procedure Laterality Date  . Cholecystectomy    . Pilonidal cyst excision  58  . Cystoscopy w/ stone manipulation    . Eye surgery      bil cat  . Total knee arthroplasty Left 09/22/2012    Procedure: TOTAL KNEE ARTHROPLASTY;  Surgeon: Lorn Junes, MD;  Location: [facility];  Service: Orthopedics;  Laterality: Left;    Family History  Problem Relation Age of Onset  . Heart attack Father     Allergies  Allergen Reactions  . Epinephrine Palpitations    "Rate races"  . Lisinopril     REACTION: cough  . Penicillins     REACTION: hives    Current Outpatient Prescriptions on File Prior to Visit  Medication Sig Dispense Refill  . aspirin 81 MG tablet Take 81 mg by mouth daily.      . bisacodyl (DULCOLAX) 5 MG EC tablet Take 2 tablets every night with dinner until bowel movement.  LAXITIVE.  Restart if two days since last bowel movement  30 tablet    . docusate sodium 100 MG CAPS 1 tab 2 times a day while on narcotics.  STOOL SOFTENER  10 capsule    . simvastatin (ZOCOR) 40 MG tablet TAKE 1 TABLET AT BEDTIME  30 tablet  0  . Tamsulosin HCl (FLOMAX) 0.4 MG CAPS Take 0.4 mg by mouth daily after supper.       No current facility-administered medications on  file prior to visit.    BP 102/68  Pulse 58  Ht 6' 0.5" (1.842 m)  Wt 237 lb (107.502 kg)  BMI 31.68 kg/m2  SpO2 98%chart    Objective:   Physical Exam  Constitutional: [name] is oriented to person, place, and time. [name] appears well-developed and well-nourished.  HENT:  Head: Normocephalic.  Right Ear: External ear normal.  Left Ear: External ear normal.  Nose: Nose normal.  Mouth/Throat: Oropharynx is clear and moist.  Eyes: Conjunctivae and EOM are normal. Pupils are equal, round, and reactive to light.  Neck: Normal range of motion. Neck supple. No thyromegaly present.  Cardiovascular: Normal rate, regular rhythm and normal heart sounds.   Pulmonary/Chest: Effort normal and breath sounds normal.    Abdominal: Soft. Bowel sounds are normal.  Musculoskeletal: Normal range of motion. [name] exhibits no edema and no tenderness.  Neurological: [name] is alert and oriented to person, place, and time. [name] has normal reflexes. [name] displays normal reflexes. No cranial nerve deficit. Coordination normal.  Skin: Skin is warm and dry.  Psychiatric: [name] has a normal mood and affect.          Assessment & Plan:  [name] was seen today for annual exam.  Diagnoses and associated orders for this visit:  Preventative health care  Need for prophylactic vaccination against Streptococcus pneumoniae (pneumococcus) - Pneumococcal conjugate vaccine 13-valent  Unspecified essential hypertension  Other and unspecified hyperlipidemia  BPH (benign prostatic hyperplasia)   Call the office with any questions or concerns. Recheck in 6 months and sooner as needed.

## 2014-01-20 ENCOUNTER — Other Ambulatory Visit: Payer: Self-pay | Admitting: Internal Medicine

## 2014-04-02 ENCOUNTER — Other Ambulatory Visit: Payer: Self-pay | Admitting: Internal Medicine

## 2014-04-06 DIAGNOSIS — H00029 Hordeolum internum unspecified eye, unspecified eyelid: Secondary | ICD-10-CM | POA: Diagnosis not present

## 2014-04-06 DIAGNOSIS — H01009 Unspecified blepharitis unspecified eye, unspecified eyelid: Secondary | ICD-10-CM | POA: Diagnosis not present

## 2014-04-12 ENCOUNTER — Encounter: Payer: Self-pay | Admitting: Internal Medicine

## 2014-04-14 MED ORDER — ALLOPURINOL 300 MG PO TABS: 300.0000 mg | ORAL_TABLET | Freq: Every day | ORAL | Status: DC

## 2014-04-20 ENCOUNTER — Encounter: Payer: Self-pay | Admitting: Internal Medicine

## 2014-04-21 MED ORDER — ALLOPURINOL 300 MG PO TABS: 300.0000 mg | ORAL_TABLET | Freq: Every day | ORAL | Status: DC

## 2014-04-22 DIAGNOSIS — Z23 Encounter for immunization: Secondary | ICD-10-CM | POA: Diagnosis not present

## 2014-05-31 ENCOUNTER — Ambulatory Visit (INDEPENDENT_AMBULATORY_CARE_PROVIDER_SITE_OTHER): Payer: Medicare Other | Admitting: Family Medicine

## 2014-05-31 ENCOUNTER — Encounter: Payer: Self-pay | Admitting: Family Medicine

## 2014-05-31 VITALS — BP 122/67 | HR 56 | Ht 75.0 in | Wt 234.0 lb

## 2014-05-31 DIAGNOSIS — I10 Essential (primary) hypertension: Secondary | ICD-10-CM | POA: Diagnosis not present

## 2014-05-31 DIAGNOSIS — E785 Hyperlipidemia, unspecified: Secondary | ICD-10-CM

## 2014-05-31 DIAGNOSIS — R011 Cardiac murmur, unspecified: Secondary | ICD-10-CM

## 2014-05-31 DIAGNOSIS — N184 Chronic kidney disease, stage 4 (severe): Secondary | ICD-10-CM

## 2014-05-31 DIAGNOSIS — M1A9XX0 Chronic gout, unspecified, without tophus (tophi): Secondary | ICD-10-CM

## 2014-05-31 MED ORDER — LOSARTAN POTASSIUM 25 MG PO TABS: 25.0000 mg | ORAL_TABLET | Freq: Every day | ORAL | Status: DC

## 2014-05-31 NOTE — Progress Notes (Signed)
CC: [name] is a 80 y.o. male is here for Establish Care   Subjective: HPI:  Very pleasant 80 year old here to establish care  Reports a history of essential hypertension: Earlier in the summer he was taking 50 mg of Cozaar on a daily basis however will get lightheaded after getting up from placing a golf ball on the tee.  His former physician encouraged him to cut back on the dose and he's only been taking 25 mg daily. He reports normotensive values at home that he takes multiple times throughout the week. He denies any lightheadedness chest pain shortness of breath or orthopnea  History of hyperlipidemia most recently checked may of this year. He's been taking simvastatin on a daily basis for well over a year now. He denies any myalgias nor right upper quadrant pain.  History of gout: Currently taking allopurinol 300 mg on a daily basis denies any joint swelling redness or warmth  Chronic kidney disease stage IV: He sees a nephrologist annually and admits to taking Aleve on a basis to help with chronic back pain. He states he's tried multiple other pain medications Aleve has been the only beneficial medication. Most recently it sounds like he tried Celebrex without benefit.   Review of Systems - General ROS: negative for - chills, fever, night sweats, weight gain or weight loss Ophthalmic ROS: negative for - decreased vision Psychological ROS: negative for - anxiety or depression ENT ROS: negative for - hearing change, nasal congestion, tinnitus or allergies Hematological and Lymphatic ROS: negative for - bleeding problems, bruising or swollen lymph nodes Breast ROS: negative Respiratory ROS: no cough, shortness of breath, or wheezing Cardiovascular ROS: no chest pain or dyspnea on exertion Gastrointestinal ROS: no abdominal pain, change in bowel habits, or black or bloody stools Genito-Urinary ROS: negative for - genital discharge, genital ulcers, incontinence or abnormal bleeding  from genitals Musculoskeletal ROS: negative for - joint pain or muscle pain other than that described above Neurological ROS: negative for - headaches or memory loss Dermatological ROS: negative for lumps, mole changes, rash and skin lesion changes  Past Medical History  Diagnosis Date  . Hyperlipidemia   . Hypertension   . PSA, INCREASED 12/05/2009  . KIDNEY STONE 12/02/2008  . Bladder stones   . UTI (urinary tract infection)   . Arthritis   . Chronic kidney disease     Past Surgical History  Procedure Laterality Date  . Cholecystectomy    . Pilonidal cyst excision  56  . Cystoscopy w/ stone manipulation    . Eye surgery      bil cat  . Total knee arthroplasty Left 09/22/2012    Procedure: TOTAL KNEE ARTHROPLASTY;  Surgeon: Richard Junes, MD;  Location: [facility];  Service: Orthopedics;  Laterality: Left;   Family History  Problem Relation Age of Onset  . Heart attack Father   . Breast cancer Mother     History   Social History  . Marital Status: Married    Spouse Name: N/A    Number of Children: N/A  . Years of Education: N/A   Occupational History  . Not on file.   Social History Main Topics  . Smoking status: Never Smoker   . Smokeless tobacco: Never Used  . Alcohol Use: 4.2 oz/week    7 Glasses of wine per week  . Drug Use: No  . Sexual Activity: Yes    Partners: Female   Other Topics Concern  . Not on file  Social History Narrative  . No narrative on file     Objective: BP 122/67  Pulse 56  Ht 6\' 3"  (1.905 m)  Wt 234 lb (106.142 kg)  BMI 29.25 kg/m2  General: Alert and Oriented, No Acute Distress HEENT: Pupils equal, round, reactive to light. Conjunctivae clear.  Mucous membranes pharynx unremarkable Lungs: Clear to auscultation bilaterally, no wheezing/ronchi/rales.  Comfortable work of breathing. Good air movement. Cardiac: Regular rate and rhythm. Normal S1/S2.  Grade 1/6 holosystolic murmur heard best at the left second intercostal  space. Extremities: No peripheral edema.  Strong peripheral pulses.  Mental Status: No depression, anxiety, nor agitation. Skin: Warm and dry.  Assessment & Plan: [name] was seen today for establish care.  Diagnoses and associated orders for this visit:  Essential hypertension - losartan (COZAAR) 25 MG tablet; Take 1 tablet (25 mg total) by mouth daily.  Hyperlipidemia  Chronic gout without tophus, unspecified cause, unspecified site  Chronic kidney disease, stage IV (severe)  Murmur    Essential hypertension: Controlled continue losartan 25 mg Hyperlipidemia: Controlled continue Zocor daily due for repeat lipid panel May 2016 Gout: Controlled, continue allopurinol 300 mg with close attention to his GFR Chronic kidney disease: Controlled, encouraged him to avoid Aleve if all possible I discussed that this decreases his renal function. Murmur: Asymptomatic we'll follow clinically   Return in about 3 months (around 08/31/2014) for Annual Physical .

## 2014-07-16 DIAGNOSIS — I129 Hypertensive chronic kidney disease with stage 1 through stage 4 chronic kidney disease, or unspecified chronic kidney disease: Secondary | ICD-10-CM | POA: Diagnosis not present

## 2014-07-16 DIAGNOSIS — N189 Chronic kidney disease, unspecified: Secondary | ICD-10-CM | POA: Diagnosis not present

## 2014-07-16 DIAGNOSIS — N2581 Secondary hyperparathyroidism of renal origin: Secondary | ICD-10-CM | POA: Diagnosis not present

## 2014-07-16 DIAGNOSIS — N183 Chronic kidney disease, stage 3 (moderate): Secondary | ICD-10-CM | POA: Diagnosis not present

## 2014-07-16 DIAGNOSIS — D631 Anemia in chronic kidney disease: Secondary | ICD-10-CM | POA: Diagnosis not present

## 2014-10-22 DIAGNOSIS — N401 Enlarged prostate with lower urinary tract symptoms: Secondary | ICD-10-CM | POA: Diagnosis not present

## 2014-10-22 DIAGNOSIS — N138 Other obstructive and reflux uropathy: Secondary | ICD-10-CM | POA: Diagnosis not present

## 2014-11-02 ENCOUNTER — Other Ambulatory Visit: Payer: Self-pay | Admitting: Family

## 2014-11-10 ENCOUNTER — Encounter: Payer: Medicare Other | Admitting: Family Medicine

## 2014-11-20 ENCOUNTER — Other Ambulatory Visit: Payer: Self-pay | Admitting: Family

## 2014-12-08 ENCOUNTER — Encounter: Payer: Self-pay | Admitting: Family Medicine

## 2014-12-08 ENCOUNTER — Ambulatory Visit (INDEPENDENT_AMBULATORY_CARE_PROVIDER_SITE_OTHER): Payer: Medicare Other | Admitting: Family Medicine

## 2014-12-08 VITALS — BP 130/62 | HR 60 | Temp 97.6°F | Wt 242.0 lb

## 2014-12-08 DIAGNOSIS — I10 Essential (primary) hypertension: Secondary | ICD-10-CM | POA: Diagnosis not present

## 2014-12-08 DIAGNOSIS — N183 Chronic kidney disease, stage 3 unspecified: Secondary | ICD-10-CM

## 2014-12-08 DIAGNOSIS — R609 Edema, unspecified: Secondary | ICD-10-CM | POA: Insufficient documentation

## 2014-12-08 DIAGNOSIS — M1A9XX0 Chronic gout, unspecified, without tophus (tophi): Secondary | ICD-10-CM | POA: Diagnosis not present

## 2014-12-08 DIAGNOSIS — E785 Hyperlipidemia, unspecified: Secondary | ICD-10-CM | POA: Diagnosis not present

## 2014-12-08 DIAGNOSIS — Z0000 Encounter for general adult medical examination without abnormal findings: Secondary | ICD-10-CM | POA: Insufficient documentation

## 2014-12-08 LAB — BASIC METABOLIC PANEL
BUN: 45 mg/dL — AB (ref 6–23)
CALCIUM: 10.3 mg/dL (ref 8.4–10.5)
CO2: 27 meq/L (ref 19–32)
CREATININE: 2.33 mg/dL — AB (ref 0.40–1.50)
Chloride: 106 mEq/L (ref 96–112)
GFR: 28.86 mL/min — AB (ref >60.00)
Glucose, Bld: 132 mg/dL — ABNORMAL HIGH (ref 70–99)
Potassium: 4.9 mEq/L (ref 3.5–5.1)
Sodium: 141 mEq/L (ref 135–145)

## 2014-12-08 LAB — URIC ACID: Uric Acid, Serum: 5.1 mg/dL (ref 4.0–7.8)

## 2014-12-08 LAB — LDL CHOLESTEROL, DIRECT: LDL DIRECT: 79 mg/dL

## 2014-12-08 MED ORDER — TRAMADOL HCL 50 MG PO TABS: 25.0000 mg | ORAL_TABLET | Freq: Four times a day (QID) | ORAL | Status: DC | PRN

## 2014-12-08 NOTE — Assessment & Plan Note (Signed)
S: controlled on simvastatin 40mg  with last LDL 95 A/P: check direct LDL, continue statin with LDL control <100

## 2014-12-08 NOTE — Assessment & Plan Note (Signed)
S: last gout flare years ago- on allopurinol . Last uric acid <6 A/P: check uric acid today, continue allopurinol

## 2014-12-08 NOTE — Assessment & Plan Note (Signed)
Aleve BID prior to today-trial tramadol 25-50mg  2-3x a aday to see if we can protect renal function

## 2014-12-08 NOTE — Assessment & Plan Note (Signed)
S:GFR persists in 14s over 3 years. Follows with [facility]. Aleve 2 pills twice for about a year for arthritic knee pain A/P:check GFR today. Trial tramadol 25-50mg  2-3x a day to try to avoid nsaid use.

## 2014-12-08 NOTE — Patient Instructions (Addendum)
Check labs today. We'll send [employer] with results.   Everything really looks great  Check in 6 months from now

## 2014-12-08 NOTE — Assessment & Plan Note (Signed)
S: controlled on losartan 25mg  A/P: continue current rx, reasonable BP control for CKD and balancing dizziness at higher doses at 50mg 

## 2014-12-08 NOTE — Progress Notes (Signed)
Richard Reddish, MD Phone: [phone]  Subjective:  Patient presents today to establish care with me as their new primary care provider. Patient was formerly a patient of Dr. Leanne Chang. Chief complaint-noted.   See problem oriented charting ROS- no change in urination pattern, no chest pain, shortness of breath, no dizziness. Does have knee pain. Does have chronic L ankle swelling for >10 years with previous workup negative reportedly. History of injury long time ago to that ankle.   The following were reviewed and entered/updated in epic: Past Medical History  Diagnosis Date  . Hyperlipidemia   . Hypertension   . PSA, INCREASED 12/05/2009  . KIDNEY STONE 12/02/2008  . Bladder stones   . UTI (urinary tract infection)   . Arthritis   . Chronic kidney disease    Patient Active Problem List   Diagnosis Date Noted  . CKD (chronic kidney disease), stage III 09/22/2012    Priority: High  . Gout 12/13/2010    Priority: Medium  . BPH (benign prostatic hyperplasia) 12/05/2009    Priority: Medium  . Hyperlipidemia 05/08/2007    Priority: Medium  . Essential hypertension 05/08/2007    Priority: Medium  . Edema 12/08/2014    Priority: Low  . Healthcare maintenance 12/08/2014    Priority: Low  . Left knee DJD 09/22/2012    Priority: Low   Past Surgical History  Procedure Laterality Date  . Cholecystectomy    . Pilonidal cyst excision  58  . Cystoscopy w/ stone manipulation    . Eye surgery      bil cat  . Total knee arthroplasty Left 09/22/2012    Procedure: TOTAL KNEE ARTHROPLASTY;  Surgeon: Lorn Junes, MD;  Location: [facility];  Service: Orthopedics;  Laterality: Left;    Family History  Problem Relation Age of Onset  . Heart attack Father     37, smoker  . Breast cancer Mother     Medications- reviewed and updated Current Outpatient Prescriptions  Medication Sig Dispense Refill  . allopurinol (ZYLOPRIM) 300 MG tablet TAKE 1 TABLET DAILY 90 tablet 1  . aspirin 81 MG  tablet Take 81 mg by mouth daily.    [name] losartan (COZAAR) 25 MG tablet Take 1 tablet (25 mg total) by mouth daily. 90 tablet 2  . simvastatin (ZOCOR) 40 MG tablet TAKE 1 TABLET AT BEDTIME 90 tablet 1  . Tamsulosin HCl (FLOMAX) 0.4 MG CAPS Take 0.4 mg by mouth daily after supper.     No current facility-administered medications for this visit.    Allergies-reviewed and updated Allergies  Allergen Reactions  . Epinephrine Palpitations    "Rate races"  . Lisinopril     REACTION: cough  . Penicillins     REACTION: hives    History   Social History  . Marital Status: Married    Spouse Name: N/A  . Number of Children: N/A  . Years of Education: N/A   Social History Main Topics  . Smoking status: Never Smoker   . Smokeless tobacco: Never Used  . Alcohol Use: 0.0 oz/week    0 Glasses of wine per week     Comment: no drink in 2 years-formerly wine  . Drug Use: No  . Sexual Activity:    Partners: Female   Other Topics Concern  . Not on file   Social History Narrative   Married (Wife June patient of Dr. Yong Channel). 2 sons. 4 grandkids.    Originally from near [place], [place]  Retired from [employer] long time. 27 years of service.       Hobbies: golf   Used to [employer], run marathons    ROS--See HPI   Objective: BP 130/62 mmHg  Pulse 60  Temp(Src) 97.6 F (36.4 C)  Wt 242 lb (109.77 kg) Gen: NAD, resting comfortably HEENT: Mucous membranes are moist. Oropharynx normal CV: RRR no murmurs rubs or gallops Lungs: CTAB no crackles, wheeze, rhonchi Abdomen: soft/nontender/nondistended/normal bowel sounds. No rebound or guarding.  Ext: no edema on R, 1+ on left ankle but not pretibial Skin: warm, dry, no rash Neuro: grossly normal, moves all extremities, PERRLA   Assessment/Plan:  CKD (chronic kidney disease), stage III S:GFR persists in 30s over 3 years. Follows with [facility]. Aleve 2 pills twice for about a year for arthritic knee pain A/P:check  GFR today. Trial tramadol 25-50mg  2-3x a day to try to avoid nsaid use.     Hyperlipidemia S: controlled on simvastatin 40mg  with last LDL 95 A/P: check direct LDL, continue statin with LDL control <100    Essential hypertension S: controlled on losartan 25mg  A/P: continue current rx, reasonable BP control for CKD and balancing dizziness at higher doses at 50mg     Gout S: last gout flare years ago- on allopurinol . Last uric acid <6 A/P: check uric acid today, continue allopurinol    Left knee DJD Aleve BID prior to today-trial tramadol 25-50mg  2-3x a aday to see if we can protect renal function    6 months.   Orders Placed This Encounter  Procedures  . Basic metabolic panel    [facility]  . LDL cholesterol, direct    [place]  . Uric Acid    Meds ordered this encounter  Medications  . traMADol (ULTRAM) 50 MG tablet    Sig: Take 0.5-1 tablets (25-50 mg total) by mouth every 6 (six) hours as needed (2-3 x a day maximum).    Dispense:  60 tablet    Refill:  0

## 2015-01-17 ENCOUNTER — Other Ambulatory Visit: Payer: Self-pay | Admitting: Family Medicine

## 2015-01-21 ENCOUNTER — Other Ambulatory Visit: Payer: Self-pay

## 2015-01-21 DIAGNOSIS — I10 Essential (primary) hypertension: Secondary | ICD-10-CM

## 2015-01-21 MED ORDER — LOSARTAN POTASSIUM 25 MG PO TABS: 25.0000 mg | ORAL_TABLET | Freq: Every day | ORAL | Status: DC

## 2015-05-19 ENCOUNTER — Other Ambulatory Visit: Payer: Self-pay | Admitting: Family

## 2015-06-10 ENCOUNTER — Ambulatory Visit: Payer: Medicare Other | Admitting: Family Medicine

## 2015-06-15 ENCOUNTER — Other Ambulatory Visit: Payer: Self-pay | Admitting: Family

## 2015-06-23 ENCOUNTER — Ambulatory Visit: Payer: Medicare Other | Admitting: Family Medicine

## 2015-10-19 ENCOUNTER — Other Ambulatory Visit: Payer: Self-pay | Admitting: Family Medicine

## 2015-11-14 ENCOUNTER — Other Ambulatory Visit: Payer: Self-pay | Admitting: Family Medicine

## 2015-12-11 ENCOUNTER — Other Ambulatory Visit: Payer: Self-pay | Admitting: Family Medicine

## 2016-01-13 ENCOUNTER — Other Ambulatory Visit: Payer: Self-pay | Admitting: Family Medicine

## 2016-01-16 ENCOUNTER — Ambulatory Visit: Payer: TRICARE For Life (TFL) | Admitting: Family Medicine

## 2016-01-17 DIAGNOSIS — M109 Gout, unspecified: Secondary | ICD-10-CM | POA: Diagnosis not present

## 2016-01-17 DIAGNOSIS — I129 Hypertensive chronic kidney disease with stage 1 through stage 4 chronic kidney disease, or unspecified chronic kidney disease: Secondary | ICD-10-CM | POA: Diagnosis not present

## 2016-01-17 DIAGNOSIS — N183 Chronic kidney disease, stage 3 (moderate): Secondary | ICD-10-CM | POA: Diagnosis not present

## 2016-01-17 DIAGNOSIS — M1990 Unspecified osteoarthritis, unspecified site: Secondary | ICD-10-CM | POA: Diagnosis not present

## 2016-01-17 DIAGNOSIS — N189 Chronic kidney disease, unspecified: Secondary | ICD-10-CM | POA: Diagnosis not present

## 2016-01-17 DIAGNOSIS — N200 Calculus of kidney: Secondary | ICD-10-CM | POA: Diagnosis not present

## 2016-01-17 DIAGNOSIS — E785 Hyperlipidemia, unspecified: Secondary | ICD-10-CM | POA: Diagnosis not present

## 2016-01-17 DIAGNOSIS — D631 Anemia in chronic kidney disease: Secondary | ICD-10-CM | POA: Diagnosis not present

## 2016-01-17 DIAGNOSIS — N2581 Secondary hyperparathyroidism of renal origin: Secondary | ICD-10-CM | POA: Diagnosis not present

## 2016-01-17 DIAGNOSIS — N400 Enlarged prostate without lower urinary tract symptoms: Secondary | ICD-10-CM | POA: Diagnosis not present

## 2016-01-19 ENCOUNTER — Encounter: Payer: Self-pay | Admitting: Family Medicine

## 2016-01-19 ENCOUNTER — Ambulatory Visit (INDEPENDENT_AMBULATORY_CARE_PROVIDER_SITE_OTHER): Payer: Medicare Other | Admitting: Family Medicine

## 2016-01-19 VITALS — BP 132/78 | HR 57 | Temp 98.0°F | Ht 72.25 in | Wt 249.0 lb

## 2016-01-19 DIAGNOSIS — N183 Chronic kidney disease, stage 3 unspecified: Secondary | ICD-10-CM

## 2016-01-19 DIAGNOSIS — M1A9XX0 Chronic gout, unspecified, without tophus (tophi): Secondary | ICD-10-CM

## 2016-01-19 DIAGNOSIS — Z0001 Encounter for general adult medical examination with abnormal findings: Secondary | ICD-10-CM | POA: Diagnosis not present

## 2016-01-19 DIAGNOSIS — R5383 Other fatigue: Secondary | ICD-10-CM | POA: Diagnosis not present

## 2016-01-19 DIAGNOSIS — E785 Hyperlipidemia, unspecified: Secondary | ICD-10-CM

## 2016-01-19 DIAGNOSIS — I10 Essential (primary) hypertension: Secondary | ICD-10-CM

## 2016-01-19 LAB — COMPREHENSIVE METABOLIC PANEL
ALBUMIN: 4.2 g/dL (ref 3.5–5.2)
ALK PHOS: 77 U/L (ref 39–117)
ALT: 21 U/L (ref 0–53)
AST: 24 U/L (ref 0–37)
BILIRUBIN TOTAL: 0.6 mg/dL (ref 0.2–1.2)
BUN: 46 mg/dL — AB (ref 6–23)
CO2: 28 mEq/L (ref 19–32)
CREATININE: 1.96 mg/dL — AB (ref 0.40–1.50)
Calcium: 9.8 mg/dL (ref 8.4–10.5)
Chloride: 106 mEq/L (ref 96–112)
GFR: 35.14 mL/min — ABNORMAL LOW (ref >60.00)
GLUCOSE: 97 mg/dL (ref 70–99)
POTASSIUM: 4.8 meq/L (ref 3.5–5.1)
SODIUM: 141 meq/L (ref 135–145)
TOTAL PROTEIN: 6.7 g/dL (ref 6.0–8.3)

## 2016-01-19 LAB — CBC
HEMATOCRIT: 46.2 % (ref 39.0–52.0)
Hemoglobin: 15.6 g/dL (ref 13.0–17.0)
MCHC: 33.8 g/dL (ref 30.0–36.0)
MCV: 90.8 fl (ref 78.0–100.0)
Platelets: 171 10*3/uL (ref 150.0–400.0)
RBC: 5.09 Mil/uL (ref 4.22–5.81)
RDW: 13.9 % (ref 11.5–15.5)
WBC: 8.5 10*3/uL (ref 4.0–10.5)

## 2016-01-19 LAB — TSH: TSH: 2.58 u[IU]/mL (ref 0.35–4.50)

## 2016-01-19 LAB — LIPID PANEL
CHOLESTEROL: 145 mg/dL (ref 0–200)
HDL: 40.1 mg/dL (ref >39.00)
LDL Cholesterol: 86 mg/dL (ref 0–99)
NONHDL: 104.72
Total CHOL/HDL Ratio: 4
Triglycerides: 96 mg/dL (ref 0.0–149.0)
VLDL: 19.2 mg/dL (ref 0.0–40.0)

## 2016-01-19 LAB — URIC ACID: Uric Acid, Serum: 5.8 mg/dL (ref 4.0–7.8)

## 2016-01-19 NOTE — Assessment & Plan Note (Signed)
S: no flares on Allopurinol 300mg . Uric acid 2013 <6 A/P: will update uric acid- continue allopurinol

## 2016-01-19 NOTE — Assessment & Plan Note (Signed)
S: controlled. On losartan 25mg   BP Readings from Last 3 Encounters:  01/19/16 132/78  12/08/14 130/62  05/31/14 122/67  A/P:Continue current meds:  Doing well

## 2016-01-19 NOTE — Assessment & Plan Note (Signed)
S: GFR had been in 30s- last check noted below this. Following every 6 months Dr. Jimmy Footman- saw yesterday.  A/P: continue to avoid nsaids (had used in past for knees). Continue nephrology follow up- may have to change this to CKD IV depending on labs

## 2016-01-19 NOTE — Progress Notes (Signed)
Phone: [phone]  Subjective:  Patient presents today for their annual wellness visit.    Preventive Screening-Counseling & Management  Smoking Status: Never Smoker Second Hand Smoking status: No smokers in home  Risk Factors Regular exercise: 60 mins 6x a week on recumbent bike or walking Diet: needs to tighten up- excess  Wt Readings from Last 3 Encounters:  01/19/16 249 lb (112.946 kg)  12/08/14 242 lb (109.77 kg)  05/31/14 234 lb (106.142 kg)  Fall Risk: None  Fall Risk  01/19/2016 12/28/2013  Falls in the past year? No No   Cardiac risk factors:  advanced age (older than 15 for men, 30 for women)  Hyperlipidemia - controlled on statin Lab Results  Component Value Date   CHOL 147 12/23/2013   HDL 39.90 12/23/2013   LDLCALC 95 12/23/2013   LDLDIRECT 79.0 12/08/2014   TRIG 63.0 12/23/2013   CHOLHDL 4 12/23/2013  No diabetes.  Hypertension- controlled on losartan   Depression Screen None. PHQ2 0  Depression screen [facility] 2/9 01/19/2016 12/28/2013  Decreased Interest 0 0  Down, Depressed, Hopeless 0 0  PHQ - 2 Score 0 0    Activities of Daily Living Independent ADLs and IADLs   Hearing Difficulties: -patient endorses, has had hearing aids- does not want to wear despite difficulties  Cognitive Testing No reported trouble.   Normal 3 word recall  List the Names of Other Physician/Practitioners you currently use: -Dr. Jimmy Footman renal CKD III-IV -Dr. Risa Grill urology - Dr. Gershon Crane optho  Immunization History  Administered Date(s) Administered  . Influenza Whole 04/25/2009, 06/21/2010, 07/01/2011  . Influenza,inj,Quad PF,36+ Mos 06/17/2013  . Influenza-Unspecified 04/22/2014  . Pneumococcal Conjugate-13 12/28/2013  . Pneumococcal Polysaccharide-23 10/22/2007  . Td 12/05/2009   Required Immunizations needed today : declined shingles  Screening tests- up to date  ROS- No pertinent positives discovered in course of AWV ROS pertinent- No chest pain  or shortness of breath. No headache or blurry vision.   The following were reviewed and entered/updated in epic: Past Medical History  Diagnosis Date  . Hyperlipidemia   . Hypertension   . PSA, INCREASED 12/05/2009  . KIDNEY STONE 12/02/2008  . Bladder stones   . UTI (urinary tract infection)   . Arthritis   . Chronic kidney disease    Patient Active Problem List   Diagnosis Date Noted  . CKD (chronic kidney disease), stage III 09/22/2012    Priority: High  . Gout 12/13/2010    Priority: Medium  . BPH (benign prostatic hyperplasia) 12/05/2009    Priority: Medium  . Hyperlipidemia 05/08/2007    Priority: Medium  . Essential hypertension 05/08/2007    Priority: Medium  . Edema 12/08/2014    Priority: Low  . Healthcare maintenance 12/08/2014    Priority: Low  . Left knee DJD 09/22/2012    Priority: Low   Past Surgical History  Procedure Laterality Date  . Cholecystectomy    . Pilonidal cyst excision  58  . Cystoscopy w/ stone manipulation    . Eye surgery      bil cat  . Total knee arthroplasty Left 09/22/2012    Procedure: TOTAL KNEE ARTHROPLASTY;  Surgeon: Lorn Junes, MD;  Location: [facility];  Service: Orthopedics;  Laterality: Left;    Family History  Problem Relation Age of Onset  . Heart attack Father     40, smoker  . Breast cancer Mother     Medications- reviewed and updated Current Outpatient Prescriptions  Medication Sig Dispense Refill  .  allopurinol (ZYLOPRIM) 300 MG tablet TAKE 1 TABLET DAILY 90 tablet 3  . aspirin 81 MG tablet Take 81 mg by mouth daily.    [name] losartan (COZAAR) 25 MG tablet TAKE 1 TABLET DAILY 90 tablet 2  . simvastatin (ZOCOR) 40 MG tablet TAKE 1 TABLET AT BEDTIME (NEED FOLLOW UP) 60 tablet 0  . Tamsulosin HCl (FLOMAX) 0.4 MG CAPS Take 0.4 mg by mouth daily after supper.     No current facility-administered medications for this visit.    Allergies-reviewed and updated Allergies  Allergen Reactions  . Epinephrine Palpitations      "Rate races"  . Lisinopril     REACTION: cough  . Penicillins     REACTION: hives    Social History   Social History  . Marital Status: Married    Spouse Name: N/A  . Number of Children: N/A  . Years of Education: N/A   Social History Main Topics  . Smoking status: Never Smoker   . Smokeless tobacco: Never Used  . Alcohol Use: 0.0 oz/week    0 Glasses of wine per week     Comment: no drink in 2 years-formerly wine  . Drug Use: No  . Sexual Activity:    Partners: Female   Other Topics Concern  . None   Social History Narrative   Married (Wife June patient of Dr. Yong Channel). 2 sons. 4 grandkids.    Originally from near [place], [place] [employer] 6[place]7 months of the year      Retired from [employer] long time. 27 years of service.       Hobbies: golf   Used to [employer], run marathons    Objective: BP 132/78 mmHg  Pulse 57  Temp(Src) 98 F (36.7 C) (Oral)  Ht 6' 0.25" (1.835 m)  Wt 249 lb (112.946 kg)  BMI 33.54 kg/m2  SpO2 95% Gen: NAD, resting comfortably HEENT: Mucous membranes are moist. Oropharynx normal Neck: no thyromegaly CV: RRR no murmurs rubs or gallops Lungs: CTAB no crackles, wheeze, rhonchi Abdomen: soft/nontender/nondistended/normal bowel sounds. No rebound or guarding.  Ext: 1+ edema left chronic, none on right Skin: warm, dry Neuro: grossly normal, moves all extremities, PERRLA  Assessment/Plan:  AWV completed- discussed recommended screenings anddocumented any personalized health advice and referrals for preventive counseling. See AVS as well which was given to patient.   Status of chronic or acute concerns   Notes some fatigue. States no daytime sleepiness or snoring- just feels more tired as day goes on. Wants to update labs which was plan today. Do not think OSA- has gained some weight and we encouraged reversing this to see if it makes a difference. If he has worsening symptoms return to care. May be part of  aging process as well. Doing great from exercise perspective.   CKD (chronic kidney disease), stage III S: GFR had been in 30s- last check noted below this. Following every 6 months Dr. Jimmy Footman- saw yesterday.  A/P: continue to avoid nsaids (had used in past for knees). Continue nephrology follow up- may have to change this to CKD IV depending on labs   Hyperlipidemia S: well controlled on simvastatin 40mg  in past. No myalgias.  Lab Results  Component Value Date   CHOL 147 12/23/2013   HDL 39.90 12/23/2013   LDLCALC 95 12/23/2013   LDLDIRECT 79.0 12/08/2014   TRIG 63.0 12/23/2013   CHOLHDL 4 12/23/2013   A/P: weight creeping up- encouraged to reverse trend. Update  lipids.     Gout S: no flares on Allopurinol 300mg . Uric acid 2013 <6 A/P: will update uric acid- continue allopurinol   Essential hypertension S: controlled. On losartan 25mg   BP Readings from Last 3 Encounters:  01/19/16 132/78  12/08/14 130/62  05/31/14 122/67  A/P:Continue current meds:  Doing well    Return in about 1 year (around 01/18/2017) for annual wellness visit. Return precautions advised.   Orders Placed This Encounter  Procedures  . CBC    [place]  . Comprehensive metabolic panel    [place]    Order Specific Question:  Has the patient fasted?    Answer:  No  . Lipid panel    [place]    Order Specific Question:  Has the patient fasted?    Answer:  No  . TSH    [place]  . Uric Acid    Garret Reddish, MD

## 2016-01-19 NOTE — Assessment & Plan Note (Signed)
S: well controlled on simvastatin 40mg  in past. No myalgias.  Lab Results  Component Value Date   CHOL 147 12/23/2013   HDL 39.90 12/23/2013   LDLCALC 95 12/23/2013   LDLDIRECT 79.0 12/08/2014   TRIG 63.0 12/23/2013   CHOLHDL 4 12/23/2013   A/P: weight creeping up- encouraged to reverse trend. Update lipids.

## 2016-01-19 NOTE — Progress Notes (Signed)
Pre visit review using our clinic review tool, if applicable. No additional management support is needed unless otherwise documented below in the visit note. 

## 2016-01-19 NOTE — Patient Instructions (Signed)
[name] , Thank you for taking time to come for your Medicare Wellness Visit. I appreciate your ongoing commitment to your health goals. Please review the following plan we discussed and let me know if I can assist you in the future.   These are the goals we discussed: 1. Reverse the weight gain trend Wt Readings from Last 3 Encounters:  01/19/16 249 lb (112.946 kg)  12/08/14 242 lb (109.77 kg)  05/31/14 234 lb (106.142 kg)  2. Continue regular exercise 3. Stop by lab before you leave 4. Have a great time at the beach!    This is a list of the screening recommended for you and due dates:  Health Maintenance  Topic Date Due  . Flu Shot  03/13/2016  . Tetanus Vaccine  12/06/2019  . Pneumonia vaccines  Completed

## 2016-01-23 ENCOUNTER — Other Ambulatory Visit: Payer: Self-pay

## 2016-01-23 MED ORDER — SIMVASTATIN 40 MG PO TABS: ORAL_TABLET | ORAL | Status: DC

## 2016-05-22 DIAGNOSIS — Z23 Encounter for immunization: Secondary | ICD-10-CM | POA: Diagnosis not present

## 2016-06-11 DIAGNOSIS — R351 Nocturia: Secondary | ICD-10-CM | POA: Diagnosis not present

## 2016-06-11 DIAGNOSIS — N401 Enlarged prostate with lower urinary tract symptoms: Secondary | ICD-10-CM | POA: Diagnosis not present

## 2016-07-15 ENCOUNTER — Other Ambulatory Visit: Payer: Self-pay | Admitting: Family Medicine

## 2016-07-17 ENCOUNTER — Encounter: Payer: Self-pay | Admitting: Internal Medicine

## 2016-07-17 ENCOUNTER — Ambulatory Visit (INDEPENDENT_AMBULATORY_CARE_PROVIDER_SITE_OTHER): Payer: Medicare Other | Admitting: Internal Medicine

## 2016-07-17 DIAGNOSIS — I10 Essential (primary) hypertension: Secondary | ICD-10-CM

## 2016-07-17 DIAGNOSIS — J069 Acute upper respiratory infection, unspecified: Secondary | ICD-10-CM | POA: Diagnosis not present

## 2016-07-17 MED ORDER — HYDROCOD POLST-CPM POLST ER 10-8 MG/5ML PO SUER: 5.0000 mL | Freq: Two times a day (BID) | ORAL | 0 refills | Status: DC | PRN

## 2016-07-17 MED ORDER — LEVOFLOXACIN 250 MG PO TABS: 250.0000 mg | ORAL_TABLET | Freq: Every day | ORAL | 0 refills | Status: AC

## 2016-07-17 NOTE — Patient Instructions (Addendum)
Please take all new medication as prescribed - the antibiotic, and cough medicine if needed  You can also take Mucinex (or it's generic off brand) for congestion, and tylenol as needed for pain.  Please continue all other medications as before, and refills have been done if requested.  Please have the pharmacy call with any other refills you may need.  Please keep your appointments with your specialists as you may have planned   

## 2016-07-17 NOTE — Progress Notes (Signed)
Pre visit review using our clinic review tool, if applicable. No additional management support is needed unless otherwise documented below in the visit note. 

## 2016-07-22 NOTE — Progress Notes (Signed)
   Subjective:    Patient ID: [name], male    DOB: 1934/04/08, 82 y.o.   MRN: [id number]  HPI   Here with 2-3 days acute onset fever, facial pain, pressure, headache, general weakness and malaise, and greenish d/c, with mild ST and cough, but pt denies chest pain, wheezing, increased sob or doe, orthopnea, PND, increased LE swelling, palpitations, dizziness or syncope.   Past Medical History:  Diagnosis Date  . Arthritis   . Bladder stones   . Chronic kidney disease   . Hyperlipidemia   . Hypertension   . KIDNEY STONE 12/02/2008  . PSA, INCREASED 12/05/2009  . UTI (urinary tract infection)    Past Surgical History:  Procedure Laterality Date  . CHOLECYSTECTOMY    . CYSTOSCOPY W/ STONE MANIPULATION    . EYE SURGERY     bil cat  . PILONIDAL CYST EXCISION  58  . TOTAL KNEE ARTHROPLASTY Left 09/22/2012   Procedure: TOTAL KNEE ARTHROPLASTY;  Surgeon: Lorn Junes, MD;  Location: [place] [facility];  Service: Orthopedics;  Laterality: Left;    reports that he has never smoked. He has never used smokeless tobacco. He reports that he drinks alcohol. He reports that he does not use drugs. family history includes Breast cancer in his mother; Heart attack in his father. Allergies  Allergen Reactions  . Epinephrine Palpitations    "Rate races"  . Lisinopril     REACTION: cough  . Penicillins     REACTION: hives   Current Outpatient Prescriptions on File Prior to Visit  Medication Sig Dispense Refill  . allopurinol (ZYLOPRIM) 300 MG tablet TAKE 1 TABLET DAILY 90 tablet 3  . aspirin 81 MG tablet Take 81 mg by mouth daily.    [name] losartan (COZAAR) 25 MG tablet TAKE 1 TABLET DAILY 90 tablet 2  . simvastatin (ZOCOR) 40 MG tablet TAKE 1 TABLET AT BEDTIME (NEED FOLLOW UP) 90 tablet 2  . Tamsulosin HCl (FLOMAX) 0.4 MG CAPS Take 0.4 mg by mouth daily after supper.     No current facility-administered medications on file prior to visit.    Review of Systems All otherwise neg per pt       Objective:   Physical Exam BP 140/80   Pulse (!) 55   Temp 98.3 F (36.8 C) (Oral)   Resp 20   Wt 255 lb (115.7 kg)   SpO2 97%   BMI 34.35 kg/m  VS noted,  Constitutional: Pt appears in no apparent distress HENT: Head: NCAT.  Right Ear: External ear normal.  Left Ear: External ear normal.  Eyes: . Pupils are equal, round, and reactive to light. Conjunctivae and EOM are normal Bilat tm's with mild erythema.  Max sinus areas mild tender.  Pharynx with mild erythema, no exudate Neck: Normal range of motion. Neck supple.  Cardiovascular: Normal rate and regular rhythm.   Pulmonary/Chest: Effort normal and breath sounds without rales or wheezing.  Neurological: Pt is alert. Not confused , motor grossly intact Skin: Skin is warm. No rash, no LE edema Psychiatric: Pt behavior is normal. No agitation.  No other changed exam findings    Assessment & Plan:

## 2016-07-22 NOTE — Assessment & Plan Note (Signed)
stable overall by history and exam, recent data reviewed with pt, and pt to continue medical treatment as before,  to f/u any worsening symptoms or concerns BP Readings from Last 3 Encounters:  07/17/16 140/80  01/19/16 132/78  12/08/14 130/62

## 2016-07-22 NOTE — Assessment & Plan Note (Signed)
Mild to mod, for antibx course,  to f/u any worsening symptoms or concerns 

## 2016-07-29 DIAGNOSIS — Z79899 Other long term (current) drug therapy: Secondary | ICD-10-CM | POA: Diagnosis not present

## 2016-07-29 DIAGNOSIS — I10 Essential (primary) hypertension: Secondary | ICD-10-CM | POA: Diagnosis not present

## 2016-07-29 DIAGNOSIS — J9811 Atelectasis: Secondary | ICD-10-CM | POA: Diagnosis not present

## 2016-07-29 DIAGNOSIS — E785 Hyperlipidemia, unspecified: Secondary | ICD-10-CM | POA: Diagnosis not present

## 2016-07-29 DIAGNOSIS — R062 Wheezing: Secondary | ICD-10-CM | POA: Diagnosis not present

## 2016-07-29 DIAGNOSIS — R05 Cough: Secondary | ICD-10-CM | POA: Diagnosis not present

## 2016-07-29 DIAGNOSIS — J40 Bronchitis, not specified as acute or chronic: Secondary | ICD-10-CM | POA: Diagnosis not present

## 2016-07-29 DIAGNOSIS — Z888 Allergy status to other drugs, medicaments and biological substances status: Secondary | ICD-10-CM | POA: Diagnosis not present

## 2016-07-29 DIAGNOSIS — Z7982 Long term (current) use of aspirin: Secondary | ICD-10-CM | POA: Diagnosis not present

## 2016-07-29 DIAGNOSIS — Z880 Allergy status to penicillin: Secondary | ICD-10-CM | POA: Diagnosis not present

## 2016-07-29 DIAGNOSIS — J4541 Moderate persistent asthma with (acute) exacerbation: Secondary | ICD-10-CM | POA: Diagnosis not present

## 2016-08-20 DIAGNOSIS — N183 Chronic kidney disease, stage 3 (moderate): Secondary | ICD-10-CM | POA: Diagnosis not present

## 2016-08-20 DIAGNOSIS — M1990 Unspecified osteoarthritis, unspecified site: Secondary | ICD-10-CM | POA: Diagnosis not present

## 2016-08-20 DIAGNOSIS — N400 Enlarged prostate without lower urinary tract symptoms: Secondary | ICD-10-CM | POA: Diagnosis not present

## 2016-08-20 DIAGNOSIS — I129 Hypertensive chronic kidney disease with stage 1 through stage 4 chronic kidney disease, or unspecified chronic kidney disease: Secondary | ICD-10-CM | POA: Diagnosis not present

## 2016-08-20 DIAGNOSIS — N2581 Secondary hyperparathyroidism of renal origin: Secondary | ICD-10-CM | POA: Diagnosis not present

## 2016-08-20 DIAGNOSIS — D631 Anemia in chronic kidney disease: Secondary | ICD-10-CM | POA: Diagnosis not present

## 2016-09-26 DIAGNOSIS — M5441 Lumbago with sciatica, right side: Secondary | ICD-10-CM | POA: Diagnosis not present

## 2016-10-18 ENCOUNTER — Ambulatory Visit (INDEPENDENT_AMBULATORY_CARE_PROVIDER_SITE_OTHER)
Admission: RE | Admit: 2016-10-18 | Discharge: 2016-10-18 | Disposition: A | Discharge status: Home / Self Care | Payer: Medicare Other | Source: Ambulatory Visit | Attending: Family Medicine | Admitting: Family Medicine

## 2016-10-18 ENCOUNTER — Encounter: Payer: Self-pay | Admitting: Family Medicine

## 2016-10-18 ENCOUNTER — Ambulatory Visit (INDEPENDENT_AMBULATORY_CARE_PROVIDER_SITE_OTHER): Payer: Medicare Other | Admitting: Family Medicine

## 2016-10-18 VITALS — BP 96/60 | HR 63 | Temp 98.3°F | Ht 72.25 in | Wt 255.0 lb

## 2016-10-18 DIAGNOSIS — R0609 Other forms of dyspnea: Secondary | ICD-10-CM | POA: Diagnosis not present

## 2016-10-18 DIAGNOSIS — R5383 Other fatigue: Secondary | ICD-10-CM

## 2016-10-18 DIAGNOSIS — R972 Elevated prostate specific antigen [PSA]: Secondary | ICD-10-CM

## 2016-10-18 DIAGNOSIS — J9811 Atelectasis: Secondary | ICD-10-CM | POA: Diagnosis not present

## 2016-10-18 LAB — COMPREHENSIVE METABOLIC PANEL
ALT: 19 U/L (ref 0–53)
AST: 19 U/L (ref 0–37)
Albumin: 4.1 g/dL (ref 3.5–5.2)
Alkaline Phosphatase: 72 U/L (ref 39–117)
BILIRUBIN TOTAL: 0.7 mg/dL (ref 0.2–1.2)
BUN: 37 mg/dL — ABNORMAL HIGH (ref 6–23)
CO2: 29 meq/L (ref 19–32)
Calcium: 9.7 mg/dL (ref 8.4–10.5)
Chloride: 107 mEq/L (ref 96–112)
Creatinine, Ser: 1.7 mg/dL — ABNORMAL HIGH (ref 0.40–1.50)
GFR: 41.33 mL/min — AB (ref >60.00)
GLUCOSE: 86 mg/dL (ref 70–99)
Potassium: 4.9 mEq/L (ref 3.5–5.1)
Sodium: 140 mEq/L (ref 135–145)
Total Protein: 6.5 g/dL (ref 6.0–8.3)

## 2016-10-18 LAB — CBC WITH DIFFERENTIAL/PLATELET
BASOS ABS: 0.1 10*3/uL (ref 0.0–0.1)
Basophils Relative: 0.7 % (ref 0.0–3.0)
EOS ABS: 0.3 10*3/uL (ref 0.0–0.7)
Eosinophils Relative: 3.8 % (ref 0.0–5.0)
HCT: 45.9 % (ref 39.0–52.0)
Hemoglobin: 15.4 g/dL (ref 13.0–17.0)
LYMPHS ABS: 2 10*3/uL (ref 0.7–4.0)
Lymphocytes Relative: 24.9 % (ref 12.0–46.0)
MCHC: 33.6 g/dL (ref 30.0–36.0)
MCV: 92.1 fl (ref 78.0–100.0)
Monocytes Absolute: 0.8 10*3/uL (ref 0.1–1.0)
Monocytes Relative: 9.8 % (ref 3.0–12.0)
NEUTROS ABS: 5 10*3/uL (ref 1.4–7.7)
NEUTROS PCT: 60.8 % (ref 43.0–77.0)
PLATELETS: 161 10*3/uL (ref 150.0–400.0)
RBC: 4.99 Mil/uL (ref 4.22–5.81)
RDW: 14.3 % (ref 11.5–15.5)
WBC: 8.2 10*3/uL (ref 4.0–10.5)

## 2016-10-18 LAB — POC URINALSYSI DIPSTICK (AUTOMATED)
Bilirubin, UA: NEGATIVE
Glucose, UA: NEGATIVE
KETONES UA: NEGATIVE
Leukocytes, UA: NEGATIVE
Nitrite, UA: NEGATIVE
PH UA: 5
PROTEIN UA: NEGATIVE
RBC UA: NEGATIVE
SPEC GRAV UA: 1.025
Urobilinogen, UA: 0.2

## 2016-10-18 LAB — TSH: TSH: 1.77 u[IU]/mL (ref 0.35–4.50)

## 2016-10-18 LAB — BRAIN NATRIURETIC PEPTIDE: Pro B Natriuretic peptide (BNP): 46 pg/mL (ref 0.0–100.0)

## 2016-10-18 LAB — PSA: PSA: 6.9 ng/mL — ABNORMAL HIGH (ref 0.10–4.00)

## 2016-10-18 NOTE — Progress Notes (Signed)
Pre visit review using our clinic review tool, if applicable. No additional management support is needed unless otherwise documented below in the visit note. 

## 2016-10-18 NOTE — Progress Notes (Signed)
Subjective:  [name] is a 83 y.o. year old very pleasant male patient who presents for/with See problem oriented charting ROS- Denies cough, congestion. No headache. No fever or chills. Mild runny nose. No changes in urination pattern. No chest pain   Past Medical History-  Patient Active Problem List   Diagnosis Date Noted  . CKD (chronic kidney disease), stage III 09/22/2012    Priority: High  . Gout 12/13/2010    Priority: Medium  . BPH (benign prostatic hyperplasia) 12/05/2009    Priority: Medium  . Hyperlipidemia 05/08/2007    Priority: Medium  . Essential hypertension 05/08/2007    Priority: Medium  . Edema 12/08/2014    Priority: Low  . Healthcare maintenance 12/08/2014    Priority: Low  . Left knee DJD 09/22/2012    Priority: Low  . Acute upper respiratory infection 07/17/2016    Medications- reviewed and updated Current Outpatient Prescriptions  Medication Sig Dispense Refill  . allopurinol (ZYLOPRIM) 300 MG tablet TAKE 1 TABLET DAILY 90 tablet 3  . aspirin 81 MG tablet Take 81 mg by mouth daily.    [name] losartan (COZAAR) 25 MG tablet TAKE 1 TABLET DAILY 90 tablet 2  . naproxen sodium (ANAPROX) 220 MG tablet Take 220 mg by mouth daily.    . simvastatin (ZOCOR) 40 MG tablet TAKE 1 TABLET AT BEDTIME (NEED FOLLOW UP) 90 tablet 2  . Tamsulosin HCl (FLOMAX) 0.4 MG CAPS Take 0.4 mg by mouth daily after supper.     No current facility-administered medications for this visit.     Objective: BP 96/60 (BP Location: Left Arm, Patient Position: Sitting, Cuff Size: Large)   Pulse 63   Temp 98.3 F (36.8 C) (Oral)   Ht 6' 0.25" (1.835 m)   Wt 255 lb (115.7 kg)   SpO2 94%   BMI 34.35 kg/m  Gen: NAD, resting comfortably No JVD CV: RRR no murmurs rubs or gallops Lungs: CTAB no crackles, wheeze, rhonchi Abdomen: soft/nontender/nondistended/normal bowel sounds.  Ext: no edema Skin: warm, dry, no rash Neuro: grossly normal, moves all extremities, normal gait  EKG:  Sinus bradycardia with rate of 53, right axis deviation, no hypertophy, no st or t wave changes except for flattening in avf  Assessment/Plan:  Dyspnea on exertion - Plan: EKG 12-Lead, CBC with Differential/Platelet, Comprehensive metabolic panel, Brain Natriuretic Peptide, DG Chest 2 View  Other fatigue - Plan: CBC with Differential/Platelet, Comprehensive metabolic panel, TSH, Brain Natriuretic Peptide, DG Chest 2 View  Elevated PSA - Plan: POCT Urinalysis Dipstick (Automated), PSA S: patient states he has felt very low energy/fatigue for 3-4 weeks at least. States usually very high energy. Up the stairs gets short of breath which is new for him. Takes a good nap in the afternoon and has never done that before. No shortness of breath when he lays down- not sleeping on more pillows. Only other symptom mild runny nose and some increased urination in daytime. No chest pain, abnormal sweating, left arm or neck pain. Not lightheaded or dizzy. Staying hydrated.   Did course of prednisone [facility] recently and finished 2 weeks ago but symptoms were going on before that time. No swelling in legs- may actually be slightly better. Losartan still taking.   Lab Results  Component Value Date   PSA 4.98 (H) 12/23/2013   PSA 4.08 (H) 03/10/2010   PSA 3.71 12/05/2009  history of BPH on flomax- sees Dr. Risa Grill but was told no more PSAe A/P: unclear etiology  of SOB and fatigue. BP is low- hold losartan for now. EKG largely reassuring. With elevated psa history will get PSA level (hopeful not large elevation and something like prostate cancer and lung mets). Some polyuria - get UA though doubt this is UTI. Will get CXR and labs-CBC diff, cMp, bnp, tsh. Follow up 1 week.   Orders Placed This Encounter  Procedures  . DG Chest 2 View    Standing Status:   Future    Standing Expiration Date:   12/18/2017    Order Specific Question:   Reason for Exam (SYMPTOM  OR DIAGNOSIS REQUIRED)    Answer:    shortness of breath    Order Specific Question:   Preferred imaging location?    Answer:   [facility]  . PSA  . CBC with Differential/Platelet  . Comprehensive metabolic panel    [place]  . TSH    [place]  . Brain Natriuretic Peptide  . POCT Urinalysis Dipstick (Automated)  . EKG 12-Lead    Order Specific Question:   Where should this test be performed    Answer:   Other    Meds ordered this encounter  Medications  . naproxen sodium (ANAPROX) 220 MG tablet    Sig: Take 220 mg by mouth daily.    Return precautions advised.  Garret Reddish, MD

## 2016-10-18 NOTE — Patient Instructions (Signed)
Stop losartan for now  Please stop by lab before you go  Please go to [facility] - located [address] across [address] from [place] - in the basement - Hours: 8:30-5:30 PM M-F. Do not need appointment.   Lets follow up in 1 week. Certainly see [place] sooner if new or worsening symptoms especially worsening shortness of breath or new chest pain or fever

## 2016-10-20 ENCOUNTER — Other Ambulatory Visit: Payer: Self-pay | Admitting: Family Medicine

## 2016-10-30 ENCOUNTER — Telehealth (HOSPITAL_COMMUNITY): Payer: Self-pay | Admitting: *Deleted

## 2016-10-30 ENCOUNTER — Ambulatory Visit (INDEPENDENT_AMBULATORY_CARE_PROVIDER_SITE_OTHER): Payer: Medicare Other | Admitting: Family Medicine

## 2016-10-30 ENCOUNTER — Encounter: Payer: Self-pay | Admitting: Family Medicine

## 2016-10-30 VITALS — BP 138/64 | HR 72 | Temp 98.4°F | Ht 72.25 in | Wt 258.6 lb

## 2016-10-30 DIAGNOSIS — R0602 Shortness of breath: Secondary | ICD-10-CM | POA: Insufficient documentation

## 2016-10-30 DIAGNOSIS — R351 Nocturia: Secondary | ICD-10-CM

## 2016-10-30 DIAGNOSIS — N401 Enlarged prostate with lower urinary tract symptoms: Secondary | ICD-10-CM

## 2016-10-30 DIAGNOSIS — R0609 Other forms of dyspnea: Secondary | ICD-10-CM | POA: Diagnosis not present

## 2016-10-30 DIAGNOSIS — R972 Elevated prostate specific antigen [PSA]: Secondary | ICD-10-CM | POA: Diagnosis not present

## 2016-10-30 DIAGNOSIS — I10 Essential (primary) hypertension: Secondary | ICD-10-CM | POA: Diagnosis not present

## 2016-10-30 NOTE — Telephone Encounter (Signed)
Patient given detailed instructions per Myocardial Perfusion Study Information Sheet for the test on 11/01/16 at 7:30. Patient notified to arrive 15 minutes early and that it is imperative to arrive on time for appointment to keep from having the test rescheduled.  If you need to cancel or reschedule your appointment, please call the office within 24 hours of your appointment. Failure to do so may result in a cancellation of your appointment, and a $50 no show fee. Patient verbalized understanding.[name]

## 2016-10-30 NOTE — Progress Notes (Signed)
Pre visit review using our clinic review tool, if applicable. No additional management support is needed unless otherwise documented below in the visit note. 

## 2016-10-30 NOTE — Assessment & Plan Note (Addendum)
S: PSA elevated from baseline of 5 up to near 7. Had been up to 6.12 six years ago but wehnt back down Lab Results  Component Value Date   PSA 6.90 (H) 10/18/2016   PSA 4.98 (H) 12/23/2013   PSA 4.08 (H) 03/10/2010  A/P: had checked this due to fatigue and with SOB concern for metastasis. He has had elevations to this level before and Dr. Risa Grill had planned to d/c screening as of 2015. We opted to repeat psa in 3 months- consider referral back if needed. With normal CXR do not feel strongly enough to get CT scan chest at present- would consider likely only after urology input at later date if needed.

## 2016-10-30 NOTE — Assessment & Plan Note (Signed)
S: controlled on no rx- held losartan 25mg  last visit due to lower BP. No improvement in his shortness of breath BP Readings from Last 3 Encounters:  10/30/16 138/64  10/18/16 96/60  07/17/16 140/80  A/P: restart 25mg  losartan.

## 2016-10-30 NOTE — Assessment & Plan Note (Addendum)
S: over a month of symptoms. Cardiomegaly noted on CXR but BNP not elevated. CXR also with some atelectasis and colon filled with some air with right diaphragm elevation. He feels very fatigued most of the day still- napping more. In addition cannot climb stairs without getting winded which is new in last 2 months A/P: will get echocardiogram and stress test at this point. With fatigue and SOb and x-ray findings I am also considering getting CT abdomen/pelvis depending on results.   In regards to SOb- doubt PE- no signs or symptoms of DVT on exam (stable chronic edema and no calf pain). Also see bph section- would consider CT chest more likely after urology eval if there was concern for prostate cancer.   Fatigue did not improve off losartan so as noted will restart

## 2016-10-30 NOTE — Patient Instructions (Addendum)
Follow up within 2 weeks after last of the echo and stress test are completed  PSA in 3 months- can schedule lab visit for this  Need to let us know immediately if new or worsening symptoms  Restart losartan as symptoms did not improve off of it

## 2016-10-30 NOTE — Progress Notes (Signed)
Subjective:  [name] is a 83 y.o. year old very pleasant male patient who presents for/with See problem oriented charting ROS- continued fatigue and shortness of breath without any improvement with stopping losartan. No chest pain.    Past Medical History-  Patient Active Problem List   Diagnosis Date Noted  . CKD (chronic kidney disease), stage III 09/22/2012    Priority: High  . Gout 12/13/2010    Priority: Medium  . BPH (benign prostatic hyperplasia) 12/05/2009    Priority: Medium  . Hyperlipidemia 05/08/2007    Priority: Medium  . Essential hypertension 05/08/2007    Priority: Medium  . Edema 12/08/2014    Priority: Low  . Healthcare maintenance 12/08/2014    Priority: Low  . Left knee DJD 09/22/2012    Priority: Low  . Shortness of breath 10/30/2016    Medications- reviewed and updated Current Outpatient Prescriptions  Medication Sig Dispense Refill  . allopurinol (ZYLOPRIM) 300 MG tablet TAKE 1 TABLET DAILY 90 tablet 3  . aspirin 81 MG tablet Take 81 mg by mouth daily.    [name] losartan (COZAAR) 25 MG tablet TAKE 1 TABLET DAILY 90 tablet 2  . naproxen sodium (ANAPROX) 220 MG tablet Take 220 mg by mouth daily.    . simvastatin (ZOCOR) 40 MG tablet Take 1 tablet (40 mg total) by mouth daily at 6 PM. TAKE 1 TABLET AT BEDTIME 90 tablet 2  . Tamsulosin HCl (FLOMAX) 0.4 MG CAPS Take 0.4 mg by mouth daily after supper.     Objective: BP 138/64 (BP Location: Left Arm, Patient Position: Sitting, Cuff Size: Large)   Pulse 72   Temp 98.4 F (36.9 C) (Oral)   Ht 6' 0.25" (1.835 m)   Wt 258 lb 9.6 oz (117.3 kg)   SpO2 96%   BMI 34.83 kg/m  Gen: NAD, resting comfortably CV: RRR no murmurs rubs or gallops Lungs: CTAB no crackles, wheeze, rhonchi Abdomen: soft/nontender/nondistended/normal bowel sounds. No rebound or guarding.  Ext: 1+ edema L > R stable Skin: warm, dry  Assessment/Plan:  Essential hypertension S: controlled on no rx- held losartan 25mg  last visit due  to lower BP. No improvement in his shortness of breath BP Readings from Last 3 Encounters:  10/30/16 138/64  10/18/16 96/60  07/17/16 140/80  A/P: restart 25mg  losartan.   BPH (benign prostatic hyperplasia) S: PSA elevated from baseline of 5 up to near 7. Had been up to 6.12 six years ago but wehnt back down Lab Results  Component Value Date   PSA 6.90 (H) 10/18/2016   PSA 4.98 (H) 12/23/2013   PSA 4.08 (H) 03/10/2010  A/P: had checked this due to fatigue and with SOB concern for metastasis. He has had elevations to this level before and Dr. Risa Grill had planned to d/c screening as of 2015. We opted to repeat psa in 3 months- consider referral back if needed. With normal CXR do not feel strongly enough to get CT scan chest at present- would consider likely only after urology input at later date if needed.   Shortness of breath S: over a month of symptoms. Cardiomegaly noted on CXR but BNP not elevated. CXR also with some atelectasis and colon filled with some air with right diaphragm elevation. He feels very fatigued most of the day still- napping more. In addition cannot climb stairs without getting winded which is new in last 2 months A/P: will get echocardiogram and stress test at this point. With fatigue and SOb and x-ray  findings I am also considering getting CT abdomen/pelvis depending on results.   In regards to SOb- doubt PE- no signs or symptoms of DVT on exam (stable chronic edema and no calf pain). Also see bph section- would consider CT chest more likely after urology eval if there was concern for prostate cancer.   Fatigue did not improve off losartan so as noted will restart  Follow up within 2 weeks after last of the echo and stress test are completed  Orders Placed This Encounter  Procedures  . PSA    Standing Status:   Future    Standing Expiration Date:   10/30/2017  . Myocardial Perfusion Imaging    Standing Status:   Future    Standing Expiration Date:   10/30/2017     Order Specific Question:   Where should this test be performed    Answer:   [facility])    Order Specific Question:   Type of stress    Answer:   Lexiscan    Order Specific Question:   Patient weight in lbs    Answer:   258  . ECHOCARDIOGRAM COMPLETE    Standing Status:   Future    Standing Expiration Date:   01/30/2018    Order Specific Question:   Where should this test be performed    Answer:   [facility])    Order Specific Question:   Does the patient weigh less than or greater than 250 lbs?    Answer:   Patient weighs greater than 250 lbs    Order Specific Question:   Complete or Limited study?    Answer:   Complete    Order Specific Question:   With Image Enhancing Agent or without Image Enhancing Agent?    Answer:   With Image Enhancing Agent    Order Specific Question:   Reason for exam-Echo    Answer:   Dyspnea  786.09 / R06.00   Return precautions advised.  Garret Reddish, MD

## 2016-10-31 ENCOUNTER — Other Ambulatory Visit: Payer: Self-pay

## 2016-10-31 ENCOUNTER — Encounter: Payer: Self-pay | Admitting: Family Medicine

## 2016-10-31 ENCOUNTER — Ambulatory Visit (HOSPITAL_COMMUNITY): Payer: Medicare Other | Attending: Internal Medicine

## 2016-10-31 DIAGNOSIS — I083 Combined rheumatic disorders of mitral, aortic and tricuspid valves: Secondary | ICD-10-CM | POA: Insufficient documentation

## 2016-10-31 DIAGNOSIS — I350 Nonrheumatic aortic (valve) stenosis: Secondary | ICD-10-CM | POA: Insufficient documentation

## 2016-10-31 DIAGNOSIS — R0609 Other forms of dyspnea: Secondary | ICD-10-CM | POA: Insufficient documentation

## 2016-10-31 DIAGNOSIS — R0602 Shortness of breath: Secondary | ICD-10-CM | POA: Insufficient documentation

## 2016-11-01 ENCOUNTER — Other Ambulatory Visit: Payer: Self-pay | Admitting: Sports Medicine

## 2016-11-01 ENCOUNTER — Encounter (HOSPITAL_COMMUNITY): Payer: Self-pay

## 2016-11-01 ENCOUNTER — Ambulatory Visit (HOSPITAL_COMMUNITY): Payer: Medicare Other | Attending: Cardiovascular Disease

## 2016-11-01 ENCOUNTER — Other Ambulatory Visit: Payer: Self-pay | Admitting: Family Medicine

## 2016-11-01 ENCOUNTER — Telehealth: Payer: Self-pay | Admitting: Family Medicine

## 2016-11-01 DIAGNOSIS — R0602 Shortness of breath: Secondary | ICD-10-CM

## 2016-11-01 DIAGNOSIS — R9439 Abnormal result of other cardiovascular function study: Secondary | ICD-10-CM | POA: Diagnosis not present

## 2016-11-01 DIAGNOSIS — R0609 Other forms of dyspnea: Secondary | ICD-10-CM

## 2016-11-01 DIAGNOSIS — I252 Old myocardial infarction: Secondary | ICD-10-CM

## 2016-11-01 DIAGNOSIS — M1711 Unilateral primary osteoarthritis, right knee: Secondary | ICD-10-CM | POA: Diagnosis not present

## 2016-11-01 DIAGNOSIS — M545 Low back pain: Secondary | ICD-10-CM

## 2016-11-01 LAB — MYOCARDIAL PERFUSION IMAGING
CHL CUP NUCLEAR SSS: 6
CSEPPHR: 94 {beats}/min
LV dias vol: 157 mL (ref 62–150)
LV sys vol: 88 mL
RATE: 0.33
Rest HR: 53 {beats}/min
SDS: 0
SRS: 6
TID: 1.02

## 2016-11-01 MED ORDER — TECHNETIUM TC 99M TETROFOSMIN IV KIT
32.7000 | PACK | Freq: Once | INTRAVENOUS | Status: AC | PRN
  Administered 2016-11-01: 32.7 via INTRAVENOUS
  Filled 2016-11-01: qty 33

## 2016-11-01 MED ORDER — REGADENOSON 0.4 MG/5ML IV SOLN
0.4000 mg | Freq: Once | INTRAVENOUS | Status: AC
  Administered 2016-11-01: 0.4 mg via INTRAVENOUS

## 2016-11-01 MED ORDER — TECHNETIUM TC 99M TETROFOSMIN IV KIT
10.5000 | PACK | Freq: Once | INTRAVENOUS | Status: AC | PRN
  Administered 2016-11-01: 10.5 via INTRAVENOUS
  Filled 2016-11-01: qty 11

## 2016-11-01 MED ORDER — ATORVASTATIN CALCIUM 40 MG PO TABS: 40.0000 mg | ORAL_TABLET | Freq: Every day | ORAL | 3 refills | Status: DC

## 2016-11-01 NOTE — Telephone Encounter (Signed)
See my chart message

## 2016-11-02 ENCOUNTER — Other Ambulatory Visit: Payer: Self-pay

## 2016-11-02 ENCOUNTER — Encounter: Payer: Self-pay | Admitting: Family Medicine

## 2016-11-02 MED ORDER — ATORVASTATIN CALCIUM 40 MG PO TABS: 40.0000 mg | ORAL_TABLET | Freq: Every day | ORAL | 3 refills | Status: DC

## 2016-11-08 ENCOUNTER — Encounter: Payer: Self-pay | Admitting: Cardiology

## 2016-11-08 ENCOUNTER — Ambulatory Visit (INDEPENDENT_AMBULATORY_CARE_PROVIDER_SITE_OTHER): Payer: Medicare Other | Admitting: Cardiology

## 2016-11-08 VITALS — BP 120/58 | HR 56 | Ht 75.0 in | Wt 255.6 lb

## 2016-11-08 DIAGNOSIS — I77810 Thoracic aortic ectasia: Secondary | ICD-10-CM

## 2016-11-08 DIAGNOSIS — I252 Old myocardial infarction: Secondary | ICD-10-CM

## 2016-11-08 DIAGNOSIS — Z01812 Encounter for preprocedural laboratory examination: Secondary | ICD-10-CM

## 2016-11-08 DIAGNOSIS — N184 Chronic kidney disease, stage 4 (severe): Secondary | ICD-10-CM

## 2016-11-08 DIAGNOSIS — R0602 Shortness of breath: Secondary | ICD-10-CM | POA: Diagnosis not present

## 2016-11-08 DIAGNOSIS — I10 Essential (primary) hypertension: Secondary | ICD-10-CM

## 2016-11-08 NOTE — Progress Notes (Signed)
Cardiology Office Note    Date:  11/08/2016   ID:  [name], DOB 1934-07-01, MRN [id number]  PCP:  Richard Reddish, MD  Cardiologist:   Richard Furbish, MD   Chief Complaint  Patient presents with  . New Patient (Initial Visit)    SOB, HX of myocardial infarction    History of Present Illness:  [name] is a 83 y.o. male here for evaluation of prior myocardial infarction with shortness of breath. He's been feeling fatigue and shortness of breath, losartan was stopped without any improvement. No specific chest pain.  This shortness of breath seems to have happened quite suddenly to him. It is as if one day he felt his normal level of activity in the next day he felt shortness of breath. He does not remember having any incident chest pain. No significant long travels. He has chronic left lower extremity edema, prior knee replacement and prior leg injury. It is fairly significant when he goes up the stairs for instance, relieved with rest.  He has never smoked cigarettes, no alcohol, no coffee. Retired Nature conservation officer for 30 years.  He has chronic back and knee pain, Dr. Alfonso Ramus has seen him. He is scheduled for an MRI of his back next Tuesday.  His wife is June    Past Medical History:  Diagnosis Date  . Arthritis   . Bladder stones   . Chronic kidney disease   . Hyperlipidemia   . Hypertension   . KIDNEY STONE 12/02/2008  . PSA, INCREASED 12/05/2009  . UTI (urinary tract infection)     Past Surgical History:  Procedure Laterality Date  . CHOLECYSTECTOMY    . CYSTOSCOPY W/ STONE MANIPULATION    . EYE SURGERY     bil cat  . PILONIDAL CYST EXCISION  58  . TOTAL KNEE ARTHROPLASTY Left 09/22/2012   Procedure: TOTAL KNEE ARTHROPLASTY;  Surgeon: Richard Junes, MD;  Location: [facility];  Service: Orthopedics;  Laterality: Left;    Current Medications: Outpatient Medications Prior to Visit  Medication Sig Dispense Refill  . aspirin 81 MG tablet Take 81 mg by mouth daily.    [name]  atorvastatin (LIPITOR) 40 MG tablet Take 1 tablet (40 mg total) by mouth daily. 90 tablet 3  . Tamsulosin HCl (FLOMAX) 0.4 MG CAPS Take 0.4 mg by mouth daily after supper.    [name] allopurinol (ZYLOPRIM) 300 MG tablet TAKE 1 TABLET DAILY (Patient not taking: Reported on 11/08/2016) 90 tablet 3  . losartan (COZAAR) 25 MG tablet TAKE 1 TABLET DAILY (Patient not taking: Reported on 11/08/2016) 90 tablet 2  . naproxen sodium (ANAPROX) 220 MG tablet Take 220 mg by mouth daily.     No facility-administered medications prior to visit.      Allergies:   Epinephrine; Lisinopril; and Penicillins   Social History   Social History  . Marital status: Married    Spouse name: N/A  . Number of children: N/A  . Years of education: N/A   Social History Main Topics  . Smoking status: Never Smoker  . Smokeless tobacco: Never Used  . Alcohol use 0.0 oz/week     Comment: no drink in 2 years-formerly wine  . Drug use: No  . Sexual activity: Yes    Partners: Female   Other Topics Concern  . None   Social History Narrative   Married (Wife June patient of Dr. Yong Channel). 2 sons. 4 grandkids.    Originally from near [place], [place]  Lives at [employer] 6-7 months of the year      Retired from [employer] long time. 27 years of service.       Hobbies: golf   Used to [employer], run marathons     Family History:  The patient's family history includes Breast cancer in his mother; Heart attack in his father.   ROS:   Please see the history of present illness.    ROS All other systems reviewed and are negative.   PHYSICAL EXAM:   VS:  BP (!) 120/58   Pulse (!) 56   Ht _0  (1.905 m)   Wt 255 lb 9.6 oz (115.9 kg)   BMI 31.95 kg/m    GEN: Well nourished, well developed, in no acute distress  HEENT: normal  Neck: no JVD, carotid bruits, or masses Cardiac: RRR; no murmurs, rubs, or gallops, Left leg edema chronic Respiratory:  clear to auscultation bilaterally, normal work of breathing GI:  soft, nontender, nondistended, + BS, overweight MS: no deformity or atrophy  Skin: warm and dry, no rash Neuro:  Alert and Oriented x 3, Strength and sensation are intact Psych: euthymic mood, full affect  Wt Readings from Last 3 Encounters:  11/08/16 255 lb 9.6 oz (115.9 kg)  11/01/16 258 lb (117 kg)  10/30/16 258 lb 9.6 oz (117.3 kg)      Studies/Labs Reviewed:   EKG: 10/18/16-heart rate 53 bpm sinus bradycardia with incomplete right bundle branch block, nonspecific ST-T wave flattening. Personally viewed.  Recent Labs: 10/18/2016: ALT 19; BUN 37; Creatinine, Ser 1.70; Hemoglobin 15.4; Platelets 161.0; Potassium 4.9; Pro B Natriuretic peptide (BNP) 46.0; Sodium 140; TSH 1.77   Lipid Panel    Component Value Date/Time   CHOL 145 01/19/2016 0902   TRIG 96.0 01/19/2016 0902   HDL 40.10 01/19/2016 0902   CHOLHDL 4 01/19/2016 0902   VLDL 19.2 01/19/2016 0902   LDLCALC 86 01/19/2016 0902   LDLDIRECT 79.0 12/08/2014 0905    Additional studies/ records that were reviewed today include:   ECHO 10/31/16 - Left ventricle: The cavity size was normal. Wall thickness was   increased in a pattern of moderate LVH. Systolic function was   normal. The estimated ejection fraction was in the range of 60%   to 65%. Wall motion was normal; there were no regional wall   motion abnormalities. Doppler parameters are consistent with   abnormal left ventricular relaxation (grade 1 diastolic   dysfunction). The E/e&' ratio is between 8-15, suggesting   indeterminate LV filling pressure. - Aortic valve: Mild aortic stenosis. There was trivial   regurgitation. Mean gradient (S): 13 mm Hg. Peak gradient (S): 24   mm Hg. Valve area (VTI): 1.98 cm^2. Valve area (Vmax): 1.77 cm^2. - Aorta: Aortic root dimension: 40 mm (ED). - Aortic root: The aortic root is mildly dilated. - Mitral valve: Calcified annulus. Mildly thickened leaflets .   There was trivial regurgitation. - Left atrium: The atrium was  normal in size. - Tricuspid valve: There was mild regurgitation. - Pulmonary arteries: PA peak pressure: 40 mm Hg (S) + RAP. - Systemic veins: The IVC was not visualized.  Impressions:  - LVEF 60-65%, moderate LVH, normal wall motion, diastolic   dysfunction with indeterminate LV filling pressure, mild aortic   stenosis - AVA 1.8-1.9 cm2, mildly dilated aortic root to 4.0 cm,   MAC with trivial MR, normal biatrial size, mild TR, RVSP 40 mmHg   + RAP, IVC not visualized, no pericardial  effusion.  NUC stress 11/01/16  Nuclear stress EF: 44%.  There was no ST segment deviation noted during stress.  The left ventricular ejection fraction is moderately decreased (30-44%).  This is an intermediate risk study.  Findings consistent with prior myocardial infarction.   Small mid and basal inferior wall infarct Small apical infarct No ischemia EF 44% diffuse hypokinesis   ASSESSMENT:    1. SOB (shortness of breath)   2. Essential hypertension   3. History of MI (myocardial infarction)   4. Pre-operative laboratory examination   5. Dilated aortic root ([place])   6. CKD (chronic kidney disease) stage 4, GFR 15-29 ml/min (HCC)      PLAN:  In order of problems listed above:  Dyspnea on exertion  - BNP was normal.  - Cardiomegaly noted on x-ray.  - Nuclear stress test did not show any evidence of ischemiaBut there was possible apical infarct as well as possible inferior infarct. Calculated ejection fraction was 44 percent on nuclear however was normal on echocardiogram.  - Given his infarct pattern on nuclear stress test and symptoms of dyspnea on exertion, we will proceed with right and left heart catheterization via the radial artery/brachial artery approach. Risks and benefits described including stroke, heart attack, death, renal impairment, bleeding. Creatinine 1.7-1.9. We will hydrate morning of procedure. He will hold his losartan.   Dilated aortic root  - 4.0. We will  continue to monitor. Continue to treat blood pressure.  Mild aortic stenosis  - Murmur heard on exam. Continue to monitor. Should not be of clinical significance causing shortness of breath at this point.  Chronic kidney disease stage 4  - We will hold his losartan the morning of and the day before his cardiac catheterization. I discussed with him risks.  - Hydrate morning of.  Chronic left lower extremity edema-prior knee replacement, prior injury in [place]  - If cardiac catheterization is unrevealing, one could consider evaluation for pulmonary embolism/chronic thromboembolic disease. We may wish to use a VQ scan given his chronic kidney disease.   Medication Adjustments/Labs and Tests Ordered: Current medicines are reviewed at length with the patient today.  Concerns regarding medicines are outlined above.  Medication changes, Labs and Tests ordered today are listed in the Patient Instructions below. Patient Instructions  Medication Instructions:  The current medical regimen is effective;  continue present plan and medications.  Labwork: Please have blood work today. (CBC, BMP and PT/INR)  Testing/Procedures: Your physician has requested that you have a cardiac catheterization. Cardiac catheterization is used to diagnose and/or treat various heart conditions. Doctors may recommend this procedure for a number of different reasons. The most common reason is to evaluate chest pain. Chest pain can be a symptom of coronary artery disease (CAD), and cardiac catheterization can show whether plaque is narrowing or blocking your heart's arteries. This procedure is also used to evaluate the valves, as well as measure the blood flow and oxygen levels in different parts of your heart. For further information please visit HugeFiesta.tn. Please follow instruction sheet, as given.  Follow-Up: Follow up approximately 2 weeks after your heart cath.  If you need a refill on your cardiac  medications before your next appointment, please call your pharmacy.  Thank you for choosing [facility]!!      [facility] OFFICE [address], [address] [place] [address] Dept: [phone] Loc: [phone]  DAWSEN KRIEGER  11/08/2016  You  are scheduled for a cardiac cath on Wednesday, 11/14/2016 with Dr. Claiborne Billings.  1. Please arrive at the [facility] (Main Entrance A) at [facility]: [address] [place], [place] [address] at 9:30am (two hours before your procedure to ensure your preparation). Free valet parking service is available.   Special note: Every effort is made to have your procedure done on time. Please understand that emergencies sometimes delay scheduled procedures.  2. Diet: Nothing to eat or drink after midnight.  3. Labs: Please have blood work today  4. Medication instructions in preparation for your procedure:  Please hold your Losartan the day before and the morning of the procedure.  On the morning of your procedure, take your ASA .  You may use sips of water.  5. Plan for one night stay--bring personal belongings. 6. Bring a current list of your medications and current insurance cards. 7. You MUST have a responsible person to drive you home. 8. Someone MUST be with you the first 24 hours after you arrive home or your discharge will be delayed. 9. Please wear clothes that are easy to get on and off and wear slip-on shoes.  Thank you for allowing [place] to care for you!   -- [facility] Invasive Cardiovascular services     Signed, Richard Furbish, MD  11/08/2016 10:30 AM    [facility] [place], [place], [place]  [address] Phone: [phone]; Fax: [phone]

## 2016-11-08 NOTE — Patient Instructions (Signed)
Medication Instructions:  The current medical regimen is effective;  continue present plan and medications.  Labwork: Please have blood work today. (CBC, BMP and PT/INR)  Testing/Procedures: Your physician has requested that you have a cardiac catheterization. Cardiac catheterization is used to diagnose and/or treat various heart conditions. Doctors may recommend this procedure for a number of different reasons. The most common reason is to evaluate chest pain. Chest pain can be a symptom of coronary artery disease (CAD), and cardiac catheterization can show whether plaque is narrowing or blocking your heart's arteries. This procedure is also used to evaluate the valves, as well as measure the blood flow and oxygen levels in different parts of your heart. For further information please visit HugeFiesta.[email or web address]. Please follow instruction sheet, as given.  Follow-Up: Follow up approximately 2 weeks after your heart cath.  If you need a refill on your cardiac medications before your next appointment, please call your pharmacy.  Thank you for choosing [facility]!!      [facility] OFFICE [address] [place] [address] [place] [address] Dept: [phone] Loc: [phone]  CEPHUS TUPY  11/08/2016  You are scheduled for a cardiac cath on Wednesday, 11/14/2016 with Dr. Claiborne Billings.  1. Please arrive at the [facility] (Main Entrance A) at [facility]: [address] [place], [place] [address] at 9:30am (two hours before your procedure to ensure your preparation). Free valet parking service is available.   Special note: Every effort is made to have your procedure done on time. Please understand that emergencies sometimes delay scheduled procedures.  2. Diet: Nothing to eat or drink after midnight.  3. Labs: Please have blood work today  4. Medication instructions in preparation for your  procedure:  Please hold your Losartan the day before and the morning of the procedure.  On the morning of your procedure, take your ASA .  You may use sips of water.  5. Plan for one night stay--bring personal belongings. 6. Bring a current list of your medications and current insurance cards. 7. You MUST have a responsible person to drive you home. 8. Someone MUST be with you the first 24 hours after you arrive home or your discharge will be delayed. 9. Please wear clothes that are easy to get on and off and wear slip-on shoes.  Thank you for allowing [place] to care for you!   -- [place] In[facility] Cardiovascular services

## 2016-11-09 LAB — CBC
Hematocrit: 46.8 % (ref 37.5–51.0)
Hemoglobin: 16.2 g/dL (ref 13.0–17.7)
MCH: 31.2 pg (ref 26.6–33.0)
MCHC: 34.6 g/dL (ref 31.5–35.7)
MCV: 90 fL (ref 79–97)
PLATELETS: 195 10*3/uL (ref 150–379)
RBC: 5.19 x10E6/uL (ref 4.14–5.80)
RDW: 14.2 % (ref 12.3–15.4)
WBC: 9.5 10*3/uL (ref 3.4–10.8)

## 2016-11-09 LAB — BASIC METABOLIC PANEL
BUN / CREAT RATIO: 20 (ref 10–24)
BUN: 31 mg/dL — ABNORMAL HIGH (ref 8–27)
CHLORIDE: 103 mmol/L (ref 96–106)
CO2: 23 mmol/L (ref 18–29)
Calcium: 9.7 mg/dL (ref 8.6–10.2)
Creatinine, Ser: 1.55 mg/dL — ABNORMAL HIGH (ref 0.76–1.27)
GFR calc non Af Amer: 42 mL/min/{1.73_m2} — ABNORMAL LOW (ref >59)
GFR, EST AFRICAN AMERICAN: 48 mL/min/{1.73_m2} — AB (ref >59)
GLUCOSE: 95 mg/dL (ref 65–99)
Potassium: 4.8 mmol/L (ref 3.5–5.2)
SODIUM: 143 mmol/L (ref 134–144)

## 2016-11-09 LAB — PROTIME-INR
INR: 1.1 (ref 0.8–1.2)
Prothrombin Time: 11.2 s (ref 9.1–12.0)

## 2016-11-12 ENCOUNTER — Encounter (HOSPITAL_COMMUNITY): Payer: Self-pay | Admitting: Cardiology

## 2016-11-12 DIAGNOSIS — R9439 Abnormal result of other cardiovascular function study: Secondary | ICD-10-CM | POA: Diagnosis present

## 2016-11-13 ENCOUNTER — Ambulatory Visit: Payer: Medicare Other | Admitting: Student

## 2016-11-13 ENCOUNTER — Other Ambulatory Visit: Payer: Medicare Other

## 2016-11-14 ENCOUNTER — Encounter (HOSPITAL_COMMUNITY)
Admission: RE | Disposition: A | Discharge status: Home / Self Care | Payer: Self-pay | Source: Ambulatory Visit | Attending: Cardiology

## 2016-11-14 ENCOUNTER — Ambulatory Visit (HOSPITAL_COMMUNITY)
Admission: RE | Admit: 2016-11-14 | Discharge: 2016-11-14 | Disposition: A | Discharge status: Home / Self Care | Payer: Medicare Other | Source: Ambulatory Visit | Attending: Cardiology | Admitting: Cardiology

## 2016-11-14 DIAGNOSIS — N184 Chronic kidney disease, stage 4 (severe): Secondary | ICD-10-CM | POA: Diagnosis not present

## 2016-11-14 DIAGNOSIS — E785 Hyperlipidemia, unspecified: Secondary | ICD-10-CM | POA: Insufficient documentation

## 2016-11-14 DIAGNOSIS — Z7982 Long term (current) use of aspirin: Secondary | ICD-10-CM | POA: Insufficient documentation

## 2016-11-14 DIAGNOSIS — I2510 Atherosclerotic heart disease of native coronary artery without angina pectoris: Secondary | ICD-10-CM | POA: Diagnosis not present

## 2016-11-14 DIAGNOSIS — I252 Old myocardial infarction: Secondary | ICD-10-CM | POA: Insufficient documentation

## 2016-11-14 DIAGNOSIS — Z8249 Family history of ischemic heart disease and other diseases of the circulatory system: Secondary | ICD-10-CM | POA: Insufficient documentation

## 2016-11-14 DIAGNOSIS — I129 Hypertensive chronic kidney disease with stage 1 through stage 4 chronic kidney disease, or unspecified chronic kidney disease: Secondary | ICD-10-CM | POA: Insufficient documentation

## 2016-11-14 DIAGNOSIS — M1990 Unspecified osteoarthritis, unspecified site: Secondary | ICD-10-CM | POA: Insufficient documentation

## 2016-11-14 DIAGNOSIS — I350 Nonrheumatic aortic (valve) stenosis: Secondary | ICD-10-CM | POA: Insufficient documentation

## 2016-11-14 DIAGNOSIS — I2584 Coronary atherosclerosis due to calcified coronary lesion: Secondary | ICD-10-CM | POA: Diagnosis not present

## 2016-11-14 DIAGNOSIS — R9439 Abnormal result of other cardiovascular function study: Secondary | ICD-10-CM | POA: Diagnosis present

## 2016-11-14 DIAGNOSIS — Z880 Allergy status to penicillin: Secondary | ICD-10-CM | POA: Insufficient documentation

## 2016-11-14 DIAGNOSIS — I77810 Thoracic aortic ectasia: Secondary | ICD-10-CM | POA: Insufficient documentation

## 2016-11-14 DIAGNOSIS — R0602 Shortness of breath: Secondary | ICD-10-CM | POA: Diagnosis present

## 2016-11-14 DIAGNOSIS — R600 Localized edema: Secondary | ICD-10-CM | POA: Diagnosis not present

## 2016-11-14 DIAGNOSIS — I4510 Unspecified right bundle-branch block: Secondary | ICD-10-CM | POA: Insufficient documentation

## 2016-11-14 HISTORY — PX: RIGHT/LEFT HEART CATH AND CORONARY ANGIOGRAPHY: CATH118266

## 2016-11-14 HISTORY — PX: CARDIAC CATHETERIZATION: SHX172

## 2016-11-14 LAB — POCT I-STAT 3, VENOUS BLOOD GAS (G3P V)
ACID-BASE DEFICIT: 1 mmol/L (ref 0.0–2.0)
ACID-BASE DEFICIT: 2 mmol/L (ref 0.0–2.0)
BICARBONATE: 24.1 mmol/L (ref 20.0–28.0)
Bicarbonate: 24.6 mmol/L (ref 20.0–28.0)
O2 Saturation: 72 %
O2 Saturation: 73 %
PCO2 VEN: 45.8 mmHg (ref 44.0–60.0)
PH VEN: 7.329 (ref 7.250–7.430)
PO2 VEN: 40 mmHg (ref 32.0–45.0)
PO2 VEN: 41 mmHg (ref 32.0–45.0)
TCO2: 25 mmol/L (ref 0–100)
TCO2: 26 mmol/L (ref 0–100)
pCO2, Ven: 45.3 mmHg (ref 44.0–60.0)
pH, Ven: 7.344 (ref 7.250–7.430)

## 2016-11-14 LAB — POCT I-STAT 3, ART BLOOD GAS (G3+)
Acid-base deficit: 1 mmol/L (ref 0.0–2.0)
Bicarbonate: 24.3 mmol/L (ref 20.0–28.0)
O2 Saturation: 97 %
PCO2 ART: 42 mmHg (ref 32.0–48.0)
PH ART: 7.371 (ref 7.350–7.450)
TCO2: 26 mmol/L (ref 0–100)
pO2, Arterial: 92 mmHg (ref 83.0–108.0)

## 2016-11-14 LAB — POCT ACTIVATED CLOTTING TIME: Activated Clotting Time: 246 seconds

## 2016-11-14 SURGERY — RIGHT/LEFT HEART CATH AND CORONARY ANGIOGRAPHY
Anesthesia: LOCAL

## 2016-11-14 MED ORDER — CLOPIDOGREL BISULFATE 75 MG PO TABS: 75.0000 mg | ORAL_TABLET | Freq: Every day | ORAL | Status: DC

## 2016-11-14 MED ORDER — SODIUM CHLORIDE 0.9 % WEIGHT BASED INFUSION
3.0000 mL/kg/h | INTRAVENOUS | Status: AC
  Administered 2016-11-14: 3 mL/kg/h via INTRAVENOUS

## 2016-11-14 MED ORDER — SODIUM CHLORIDE 0.9% FLUSH: 3.0000 mL | Freq: Two times a day (BID) | INTRAVENOUS | Status: DC

## 2016-11-14 MED ORDER — IOPAMIDOL (ISOVUE-370) INJECTION 76%
INTRAVENOUS | Status: AC
  Filled 2016-11-14: qty 50

## 2016-11-14 MED ORDER — MORPHINE SULFATE (PF) 4 MG/ML IV SOLN: 1.0000 mg | INTRAVENOUS | Status: DC | PRN

## 2016-11-14 MED ORDER — CLOPIDOGREL BISULFATE 75 MG PO TABS: 75.0000 mg | ORAL_TABLET | Freq: Every day | ORAL | 6 refills | Status: DC

## 2016-11-14 MED ORDER — ONDANSETRON HCL 4 MG/2ML IJ SOLN: 4.0000 mg | Freq: Four times a day (QID) | INTRAMUSCULAR | Status: DC | PRN

## 2016-11-14 MED ORDER — HEPARIN (PORCINE) IN NACL 2-0.9 UNIT/ML-% IJ SOLN
INTRAMUSCULAR | Status: AC
  Filled 2016-11-14: qty 1000

## 2016-11-14 MED ORDER — VERAPAMIL HCL 2.5 MG/ML IV SOLN
INTRAVENOUS | Status: DC | PRN
  Administered 2016-11-14: 10 mL via INTRA_ARTERIAL

## 2016-11-14 MED ORDER — FENTANYL CITRATE (PF) 100 MCG/2ML IJ SOLN
INTRAMUSCULAR | Status: AC
  Filled 2016-11-14: qty 2

## 2016-11-14 MED ORDER — SODIUM CHLORIDE 0.9% FLUSH: 3.0000 mL | INTRAVENOUS | Status: DC | PRN

## 2016-11-14 MED ORDER — HEPARIN (PORCINE) IN NACL 2-0.9 UNIT/ML-% IJ SOLN
INTRAMUSCULAR | Status: DC | PRN
Start: 2016-11-14 — End: 2016-11-14
  Administered 2016-11-14: 1000 mL

## 2016-11-14 MED ORDER — MIDAZOLAM HCL 2 MG/2ML IJ SOLN
INTRAMUSCULAR | Status: DC | PRN
  Administered 2016-11-14: 2 mg via INTRAVENOUS
  Administered 2016-11-14: 1 mg via INTRAVENOUS

## 2016-11-14 MED ORDER — IOPAMIDOL (ISOVUE-370) INJECTION 76%
INTRAVENOUS | Status: DC | PRN
  Administered 2016-11-14: 140 mL via INTRA_ARTERIAL

## 2016-11-14 MED ORDER — SODIUM CHLORIDE 0.9 % WEIGHT BASED INFUSION: 1.0000 mL/kg/h | INTRAVENOUS | Status: DC

## 2016-11-14 MED ORDER — LIDOCAINE HCL (PF) 1 % IJ SOLN
INTRAMUSCULAR | Status: AC
  Filled 2016-11-14: qty 30

## 2016-11-14 MED ORDER — VERAPAMIL HCL 2.5 MG/ML IV SOLN
INTRAVENOUS | Status: AC
  Filled 2016-11-14: qty 2

## 2016-11-14 MED ORDER — CLOPIDOGREL BISULFATE 75 MG PO TABS
300.0000 mg | ORAL_TABLET | Freq: Once | ORAL | Status: AC
  Administered 2016-11-14: 300 mg via ORAL

## 2016-11-14 MED ORDER — SODIUM CHLORIDE 0.9 % IV SOLN: 250.0000 mL | INTRAVENOUS | Status: DC | PRN

## 2016-11-14 MED ORDER — LIDOCAINE HCL (PF) 1 % IJ SOLN
INTRAMUSCULAR | Status: DC | PRN
  Administered 2016-11-14: 5 mL

## 2016-11-14 MED ORDER — CLOPIDOGREL BISULFATE 300 MG PO TABS
ORAL_TABLET | ORAL | Status: AC
  Administered 2016-11-14: 300 mg via ORAL
  Filled 2016-11-14: qty 1

## 2016-11-14 MED ORDER — ACETAMINOPHEN 325 MG PO TABS: 650.0000 mg | ORAL_TABLET | ORAL | Status: DC | PRN

## 2016-11-14 MED ORDER — HEPARIN SODIUM (PORCINE) 1000 UNIT/ML IJ SOLN
INTRAMUSCULAR | Status: DC | PRN
  Administered 2016-11-14: 2000 [IU] via INTRAVENOUS
  Administered 2016-11-14: 4500 [IU] via INTRAVENOUS
  Administered 2016-11-14: 5500 [IU] via INTRAVENOUS

## 2016-11-14 MED ORDER — HEPARIN SODIUM (PORCINE) 1000 UNIT/ML IJ SOLN
INTRAMUSCULAR | Status: AC
  Filled 2016-11-14: qty 1

## 2016-11-14 MED ORDER — SODIUM CHLORIDE 0.9 % IV SOLN: INTRAVENOUS | Status: DC

## 2016-11-14 MED ORDER — MIDAZOLAM HCL 2 MG/2ML IJ SOLN
INTRAMUSCULAR | Status: AC
  Filled 2016-11-14: qty 2

## 2016-11-14 MED ORDER — ADENOSINE 12 MG/4ML IV SOLN
INTRAVENOUS | Status: AC
  Filled 2016-11-14: qty 16

## 2016-11-14 MED ORDER — ASPIRIN 81 MG PO CHEW: 81.0000 mg | CHEWABLE_TABLET | ORAL | Status: DC

## 2016-11-14 MED ORDER — FENTANYL CITRATE (PF) 100 MCG/2ML IJ SOLN
INTRAMUSCULAR | Status: DC | PRN
  Administered 2016-11-14 (×2): 25 ug via INTRAVENOUS

## 2016-11-14 MED ORDER — IOPAMIDOL (ISOVUE-370) INJECTION 76%
INTRAVENOUS | Status: AC
  Filled 2016-11-14: qty 100

## 2016-11-14 MED ORDER — ADENOSINE (DIAGNOSTIC) 140MCG/KG/MIN
INTRAVENOUS | Status: DC | PRN
  Administered 2016-11-14: 140 ug/kg/min via INTRAVENOUS

## 2016-11-14 SURGICAL SUPPLY — 17 items
CATH BALLN WEDGE 5F 110CM (CATHETERS) ×2 IMPLANT
CATH INFINITI 5FR ANG PIGTAIL (CATHETERS) ×2 IMPLANT
CATH INFINITI JR4 5F (CATHETERS) ×2 IMPLANT
CATH LAUNCHER 5F EBU3.5 (CATHETERS) ×2 IMPLANT
CATH OPTITORQUE TIG 4.0 5F (CATHETERS) ×2 IMPLANT
DEVICE RAD COMP TR BAND LRG (VASCULAR PRODUCTS) ×2 IMPLANT
GLIDESHEATH SLEND A-KIT 6F 22G (SHEATH) ×2 IMPLANT
GUIDEWIRE INQWIRE 1.5J.035X260 (WIRE) ×1 IMPLANT
GUIDEWIRE PRESSURE COMET II (WIRE) ×2 IMPLANT
INQWIRE 1.5J .035X260CM (WIRE) ×2
KIT ESSENTIALS PG (KITS) ×2 IMPLANT
KIT HEART LEFT (KITS) ×2 IMPLANT
PACK CARDIAC CATHETERIZATION (CUSTOM PROCEDURE TRAY) ×2 IMPLANT
SHEATH GLIDE SLENDER 4/5FR (SHEATH) ×2 IMPLANT
SYR MEDRAD MARK V 150ML (SYRINGE) ×2 IMPLANT
TRANSDUCER W/STOPCOCK (MISCELLANEOUS) ×2 IMPLANT
TUBING CIL FLEX 10 FLL-RA (TUBING) ×2 IMPLANT

## 2016-11-14 NOTE — Discharge Instructions (Signed)
Radial Site Care °Refer to this sheet in the next few weeks. These instructions provide you with information about caring for yourself after your procedure. Your health care provider may also give you more specific instructions. Your treatment has been planned according to current medical practices, but problems sometimes occur. Call your health care provider if you have any problems or questions after your procedure. °What can I expect after the procedure? °After your procedure, it is typical to have the following: °· Bruising at the radial site that usually fades within 1-2 weeks. °· Blood collecting in the tissue (hematoma) that may be painful to the touch. It should usually decrease in size and tenderness within 1-2 weeks. °Follow these instructions at home: °· Take medicines only as directed by your health care provider. °· You may shower 24-48 hours after the procedure or as directed by your health care provider. Remove the bandage (dressing) and gently wash the site with plain soap and water. Pat the area dry with a clean towel. Do not rub the site, because this may cause bleeding. °· Do not take baths, swim, or use a hot tub until your health care provider approves. °· Check your insertion site every day for redness, swelling, or drainage. °· Do not apply powder or lotion to the site. °· Do not flex or bend the affected arm for 24 hours or as directed by your health care provider. °· Do not push or pull heavy objects with the affected arm for 24 hours or as directed by your health care provider. °· Do not lift over 10 lb (4.5 kg) for 5 days after your procedure or as directed by your health care provider. °· Ask your health care provider when it is okay to: °¨ Return to work or school. °¨ Resume usual physical activities or sports. °¨ Resume sexual activity. °· Do not drive home if you are discharged the same day as the procedure. Have someone else drive you. °· You may drive 24 hours after the procedure  unless otherwise instructed by your health care provider. °· Do not operate machinery or power tools for 24 hours after the procedure. °· If your procedure was done as an outpatient procedure, which means that you went home the same day as your procedure, a responsible adult should be with you for the first 24 hours after you arrive home. °· Keep all follow-up visits as directed by your health care provider. This is important. °Contact a health care provider if: °· You have a fever. °· You have chills. °· You have increased bleeding from the radial site. Hold pressure on the site. CALL 911 °Get help right away if: °· You have unusual pain at the radial site. °· You have redness, warmth, or swelling at the radial site. °· You have drainage (other than a small amount of blood on the dressing) from the radial site. °· The radial site is bleeding, and the bleeding does not stop after 30 minutes of holding steady pressure on the site. °· Your arm or hand becomes pale, cool, tingly, or numb. °This information is not intended to replace advice given to you by your health care provider. Make sure you discuss any questions you have with your health care provider. °Document Released: 09/01/2010 Document Revised: 01/05/2016 Document Reviewed: 02/15/2014 °Elsevier Interactive Patient Education © 2017 [employer] ° ° °

## 2016-11-14 NOTE — H&P (View-Only) (Signed)
Cardiology Office Note    Date:  11/08/2016   ID:  [name], DOB 1934-07-01, MRN [id number]  PCP:  Garret Reddish, MD  Cardiologist:   [name], MD   Chief Complaint  Patient presents with  . New Patient (Initial Visit)    SOB, HX of myocardial infarction    History of Present Illness:  [name] is a 83 y.o. male here for evaluation of prior myocardial infarction with shortness of breath. He's been feeling fatigue and shortness of breath, losartan was stopped without any improvement. No specific chest pain.  This shortness of breath seems to have happened quite suddenly to him. It is as if one day he felt his normal level of activity in the next day he felt shortness of breath. He does not remember having any incident chest pain. No significant long travels. He has chronic left lower extremity edema, prior knee replacement and prior leg injury. It is fairly significant when he goes up the stairs for instance, relieved with rest.  He has never smoked cigarettes, no alcohol, no coffee. Retired Nature conservation officer for 30 years.  He has chronic back and knee pain, Dr. Alfonso Ramus has seen him. He is scheduled for an MRI of his back next Tuesday.  His wife is June    Past Medical History:  Diagnosis Date  . Arthritis   . Bladder stones   . Chronic kidney disease   . Hyperlipidemia   . Hypertension   . KIDNEY STONE 12/02/2008  . PSA, INCREASED 12/05/2009  . UTI (urinary tract infection)     Past Surgical History:  Procedure Laterality Date  . CHOLECYSTECTOMY    . CYSTOSCOPY W/ STONE MANIPULATION    . EYE SURGERY     bil cat  . PILONIDAL CYST EXCISION  58  . TOTAL KNEE ARTHROPLASTY Left 09/22/2012   Procedure: TOTAL KNEE ARTHROPLASTY;  Surgeon: Lorn Junes, MD;  Location: [facility];  Service: Orthopedics;  Laterality: Left;    Current Medications: Outpatient Medications Prior to Visit  Medication Sig Dispense Refill  . aspirin 81 MG tablet Take 81 mg by mouth daily.    [name]  atorvastatin (LIPITOR) 40 MG tablet Take 1 tablet (40 mg total) by mouth daily. 90 tablet 3  . Tamsulosin HCl (FLOMAX) 0.4 MG CAPS Take 0.4 mg by mouth daily after supper.    [name] allopurinol (ZYLOPRIM) 300 MG tablet TAKE 1 TABLET DAILY (Patient not taking: Reported on 11/08/2016) 90 tablet 3  . losartan (COZAAR) 25 MG tablet TAKE 1 TABLET DAILY (Patient not taking: Reported on 11/08/2016) 90 tablet 2  . naproxen sodium (ANAPROX) 220 MG tablet Take 220 mg by mouth daily.     No facility-administered medications prior to visit.      Allergies:   Epinephrine; Lisinopril; and Penicillins   Social History   Social History  . Marital status: Married    Spouse name: N/A  . Number of children: N/A  . Years of education: N/A   Social History Main Topics  . Smoking status: Never Smoker  . Smokeless tobacco: Never Used  . Alcohol use 0.0 oz/week     Comment: no drink in 2 years-formerly wine  . Drug use: No  . Sexual activity: Yes    Partners: Female   Other Topics Concern  . None   Social History Narrative   Married (Wife June patient of Dr. Yong Channel). 2 sons. 4 grandkids.    Originally from near [place], [place]  Lives at [employer] 6-7 months of the year      Retired from [employer] long time. 27 years of service.       Hobbies: golf   Used to [employer], run marathons     Family History:  The patient's family history includes Breast cancer in his mother; Heart attack in his father.   ROS:   Please see the history of present illness.    ROS All other systems reviewed and are negative.   PHYSICAL EXAM:   VS:  BP (!) 120/58   Pulse (!) 56   Ht _0  (1.905 m)   Wt 255 lb 9.6 oz (115.9 kg)   BMI 31.95 kg/m    GEN: Well nourished, well developed, in no acute distress  HEENT: normal  Neck: no JVD, carotid bruits, or masses Cardiac: RRR; no murmurs, rubs, or gallops, Left leg edema chronic Respiratory:  clear to auscultation bilaterally, normal work of breathing GI:  soft, nontender, nondistended, + BS, overweight MS: no deformity or atrophy  Skin: warm and dry, no rash Neuro:  Alert and Oriented x 3, Strength and sensation are intact Psych: euthymic mood, full affect  Wt Readings from Last 3 Encounters:  11/08/16 255 lb 9.6 oz (115.9 kg)  11/01/16 258 lb (117 kg)  10/30/16 258 lb 9.6 oz (117.3 kg)      Studies/Labs Reviewed:   EKG: 10/18/16-heart rate 53 bpm sinus bradycardia with incomplete right bundle branch block, nonspecific ST-T wave flattening. Personally viewed.  Recent Labs: 10/18/2016: ALT 19; BUN 37; Creatinine, Ser 1.70; Hemoglobin 15.4; Platelets 161.0; Potassium 4.9; Pro B Natriuretic peptide (BNP) 46.0; Sodium 140; TSH 1.77   Lipid Panel    Component Value Date/Time   CHOL 145 01/19/2016 0902   TRIG 96.0 01/19/2016 0902   HDL 40.10 01/19/2016 0902   CHOLHDL 4 01/19/2016 0902   VLDL 19.2 01/19/2016 0902   LDLCALC 86 01/19/2016 0902   LDLDIRECT 79.0 12/08/2014 0905    Additional studies/ records that were reviewed today include:   ECHO 10/31/16 - Left ventricle: The cavity size was normal. Wall thickness was   increased in a pattern of moderate LVH. Systolic function was   normal. The estimated ejection fraction was in the range of 60%   to 65%. Wall motion was normal; there were no regional wall   motion abnormalities. Doppler parameters are consistent with   abnormal left ventricular relaxation (grade 1 diastolic   dysfunction). The E/e&' ratio is between 8-15, suggesting   indeterminate LV filling pressure. - Aortic valve: Mild aortic stenosis. There was trivial   regurgitation. Mean gradient (S): 13 mm Hg. Peak gradient (S): 24   mm Hg. Valve area (VTI): 1.98 cm^2. Valve area (Vmax): 1.77 cm^2. - Aorta: Aortic root dimension: 40 mm (ED). - Aortic root: The aortic root is mildly dilated. - Mitral valve: Calcified annulus. Mildly thickened leaflets .   There was trivial regurgitation. - Left atrium: The atrium was  normal in size. - Tricuspid valve: There was mild regurgitation. - Pulmonary arteries: PA peak pressure: 40 mm Hg (S) + RAP. - Systemic veins: The IVC was not visualized.  Impressions:  - LVEF 60-65%, moderate LVH, normal wall motion, diastolic   dysfunction with indeterminate LV filling pressure, mild aortic   stenosis - AVA 1.8-1.9 cm2, mildly dilated aortic root to 4.0 cm,   MAC with trivial MR, normal biatrial size, mild TR, RVSP 40 mmHg   + RAP, IVC not visualized, no pericardial  effusion.  NUC stress 11/01/16  Nuclear stress EF: 44%.  There was no ST segment deviation noted during stress.  The left ventricular ejection fraction is moderately decreased (30-44%).  This is an intermediate risk study.  Findings consistent with prior myocardial infarction.   Small mid and basal inferior wall infarct Small apical infarct No ischemia EF 44% diffuse hypokinesis   ASSESSMENT:    1. SOB (shortness of breath)   2. Essential hypertension   3. History of MI (myocardial infarction)   4. Pre-operative laboratory examination   5. Dilated aortic root ([place])   6. CKD (chronic kidney disease) stage 4, GFR 15-29 ml/min (HCC)      PLAN:  In order of problems listed above:  Dyspnea on exertion  - BNP was normal.  - Cardiomegaly noted on x-ray.  - Nuclear stress test did not show any evidence of ischemiaBut there was possible apical infarct as well as possible inferior infarct. Calculated ejection fraction was 44 percent on nuclear however was normal on echocardiogram.  - Given his infarct pattern on nuclear stress test and symptoms of dyspnea on exertion, we will proceed with right and left heart catheterization via the radial artery/brachial artery approach. Risks and benefits described including stroke, heart attack, death, renal impairment, bleeding. Creatinine 1.7-1.9. We will hydrate morning of procedure. He will hold his losartan.   Dilated aortic root  - 4.0. We will  continue to monitor. Continue to treat blood pressure.  Mild aortic stenosis  - Murmur heard on exam. Continue to monitor. Should not be of clinical significance causing shortness of breath at this point.  Chronic kidney disease stage 4  - We will hold his losartan the morning of and the day before his cardiac catheterization. I discussed with him risks.  - Hydrate morning of.  Chronic left lower extremity edema-prior knee replacement, prior injury in [place]  - If cardiac catheterization is unrevealing, one could consider evaluation for pulmonary embolism/chronic thromboembolic disease. We may wish to use a VQ scan given his chronic kidney disease.   Medication Adjustments/Labs and Tests Ordered: Current medicines are reviewed at length with the patient today.  Concerns regarding medicines are outlined above.  Medication changes, Labs and Tests ordered today are listed in the Patient Instructions below. Patient Instructions  Medication Instructions:  The current medical regimen is effective;  continue present plan and medications.  Labwork: Please have blood work today. (CBC, BMP and PT/INR)  Testing/Procedures: Your physician has requested that you have a cardiac catheterization. Cardiac catheterization is used to diagnose and/or treat various heart conditions. Doctors may recommend this procedure for a number of different reasons. The most common reason is to evaluate chest pain. Chest pain can be a symptom of coronary artery disease (CAD), and cardiac catheterization can show whether plaque is narrowing or blocking your heart's arteries. This procedure is also used to evaluate the valves, as well as measure the blood flow and oxygen levels in different parts of your heart. For further information please visit [email or web address]. Please follow instruction sheet, as given.  Follow-Up: Follow up approximately 2 weeks after your heart cath.  If you need a refill on your cardiac  medications before your next appointment, please call your pharmacy.  Thank you for choosing [facility]!!      [facility] OFFICE [address], [address] [place] [address] Dept: [phone] Loc: [phone]  [name]  11/08/2016  You  are scheduled for a cardiac cath on Wednesday, 11/14/2016 with Dr. Claiborne Billings.  1. Please arrive at the [facility] (Main Entrance A) at [facility] Main: [address] [place], [place] [address] at 9:30am (two hours before your procedure to ensure your preparation). Free valet parking service is available.   Special note: Every effort is made to have your procedure done on time. Please understand that emergencies sometimes delay scheduled procedures.  2. Diet: Nothing to eat or drink after midnight.  3. Labs: Please have blood work today  4. Medication instructions in preparation for your procedure:  Please hold your Losartan the day before and the morning of the procedure.  On the morning of your procedure, take your ASA .  You may use sips of water.  5. Plan for one night stay--bring personal belongings. 6. Bring a current list of your medications and current insurance cards. 7. You MUST have a responsible person to drive you home. 8. Someone MUST be with you the first 24 hours after you arrive home or your discharge will be delayed. 9. Please wear clothes that are easy to get on and off and wear slip-on shoes.  Thank you for allowing [place] to care for you!   -- [facility] Invasive Cardiovascular services     Signed, [name], MD  11/08/2016 10:30 AM    [facility] [place], [place], [place]  [address] Phone: [phone]; Fax: [phone]

## 2016-11-14 NOTE — Interval H&P Note (Signed)
History and Physical Interval Note:  11/14/2016 10:41 AM  [name]  has presented today for surgery, with the diagnosis of Abnormal Nuclear ST with Exertional Dyspnea.  The various methods of treatment have been discussed with the patient and family. After consideration of risks, benefits and other options for treatment, the patient has consented to  Procedure(s): Right/Left Heart Cath and Coronary Angiography (N/A) with possible Percutaneous Coronary Intervention as a surgical intervention .  The patient's history has been reviewed, patient examined, no change in status, stable for surgery.  I have reviewed the patient's chart and labs.  Questions were answered to the patient's satisfaction.    Cath Lab Visit (complete for each Cath Lab visit)  Clinical Evaluation Leading to the Procedure:   ACS: No.  Non-ACS:    Anginal Classification: CCS III - DOE  Anti-ischemic medical therapy: Minimal Therapy (1 class of medications)  Non-Invasive Test Results: Equivocal test results - reduced EF with ? Prior infarct on Myoview, but normal EF by Echo w/o RWMA  Prior CABG: No previous CABG   Glenetta Hew

## 2016-11-14 NOTE — Brief Op Note (Signed)
    11/14/2016  1:19 PM  PATIENT:  [name]  83 y.o. male with progressively worsening exertional dyspnea who was evaluated with a Myoview ST that suggested prior RCA infarct.  He is referred for R&LHC.  PRE-OPERATIVE DIAGNOSIS: Abnormal ST  POST-OPERATIVE DIAGNOSIS:   Essentially normal right heart cath pressures with PCWP of 17 mmHg.  Heavily calcified coronary arteries including all 3 vessels with severe proximal RCA 80% stenosis, proximal LAD calcified 65-70% stenosis with an FFR of 0.78.  Minimal disease in the circumflex which courses is a major lateral OM.  EF roughly 55-60%.  PROCEDURE:  Procedure(s): Right/Left Heart Cath and Coronary Angiography (N/A)  SURGEON:  Surgeon(s) and Role:    * Leonie Man, MD - Primary  PHYSICIAN ASSISTANT:   ANESTHESIA:   local and IV sedation - 2 mL lidocaine at right brachial site, 3 mL lidocaine at right radial site; 1 g Versed, 50 g fentanyl  EBL:  No intake/output data recorded. < 50 mL  PROCEDURE DETAIL:   Right brachial IV exchanged for 5 French glide sheath; right radial access with 6 French glide sheath using modified Seldinger technique and micropuncture Angiocath  5 French right heart catheter advanced under fluoroscopy into the RA, RV, PA and PCWP position. -- PA blood sample taken simultaneously with arterial saturation for cardiac output/index calculation by Fick -> after LV pressures were measured, the catheter was removed  5 [place] TIG 4.0 catheter advanced over long exchange safety J-wire, unable to engage coronary arteries, therefore exchanged for JR4 catheter for RCA angiography, and EBU guide catheter for our LCA angiography.--> Angiography revealed significant RCA disease as well as proximal LAD disease.  FFR of LAD: Additional heparin to achieve ACT > 250 sec --> Coment wire into LAD beyond D1 --> adenosine infusion at 140 g/kilogram/min for a total of 90 seconds.  Initial FFR 0.91. Final FFR 0.78  Left  Ventricular Pressures and LV Gram:: Angled pigtail catheter; catheter was then removed out of the body over wire without complication  MEDICATIONS USED:   Radial cocktail: 3 mL verapamil in 10 mL NS  Contrast: 140 mL  Adenosine Infusion = 90 Sec   TR Band:  1300 hr, 15 mL air; Brachial sheath removed in the Cath Lab with pressure held for hemostasis  DICTATION: .Note written in EPIC  PLAN OF CARE: Discharge to home after PACU  PATIENT DISPOSITION:  With heavily calcified RCA and LAD disease,Plans will be for him to return for atherectomy-based PCI of both the RCA and LAD.   He will be loaded on Plavix 300 mg a day and then initiate 75 mg daily  Delay start of Pharmacological VTE agent (>24hrs) due to surgical blood loss or risk of bleeding: not applicable   Glenetta Hew, M.D., M.S. Interventional Cardiologist   Pager # [phone] Phone # [phone] [address] [place], [place] [address]

## 2016-11-15 ENCOUNTER — Encounter (HOSPITAL_COMMUNITY): Payer: Self-pay | Admitting: Cardiology

## 2016-11-16 ENCOUNTER — Telehealth: Payer: Self-pay | Admitting: Cardiology

## 2016-11-16 ENCOUNTER — Encounter: Payer: Self-pay | Admitting: Cardiology

## 2016-11-16 NOTE — Telephone Encounter (Signed)
Returned call to patient.Stated he is scheduled for a cath with Dr.Harding 11/21/16.Stated no one has given him any instructions.Advised of cath instructions.Advised cath is scheduled 11/21/16 at 11:30 am.Arrive at new main entrance of [facility] at 9:30 am.

## 2016-11-16 NOTE — Telephone Encounter (Signed)
New Message  Pt voiced calling to get information about his Cath that's scheduled for 4.11.18 but no one has called him for scheduling or anything.  Please f/u

## 2016-11-20 ENCOUNTER — Telehealth: Payer: Self-pay | Admitting: *Deleted

## 2016-11-20 ENCOUNTER — Other Ambulatory Visit: Payer: Self-pay | Admitting: *Deleted

## 2016-11-20 DIAGNOSIS — Z01818 Encounter for other preprocedural examination: Secondary | ICD-10-CM

## 2016-11-20 NOTE — Telephone Encounter (Signed)
CALLED SPOKE TO PATIENT PATIENT AWARE TO TAKE ASPIRIN AND PLAVIX PRIOR TO LEAVING THE HOUSE TOMORROW BEFORE PROCEDURE.  ( LAB WILL BE DONE ON ARRIVAL). PATIENT STATES HE INFORMED NOT TO TAKE LOSARTAN TODAY OR TOMORROW. RN INFORMED PATIENT TO CONTINUE .

## 2016-11-21 ENCOUNTER — Encounter (HOSPITAL_COMMUNITY): Payer: Self-pay | Admitting: General Practice

## 2016-11-21 ENCOUNTER — Encounter (HOSPITAL_COMMUNITY)
Admission: RE | Disposition: A | Discharge status: Home / Self Care | Payer: Self-pay | Source: Ambulatory Visit | Attending: Cardiology

## 2016-11-21 ENCOUNTER — Ambulatory Visit (HOSPITAL_COMMUNITY)
Admission: RE | Admit: 2016-11-21 | Discharge: 2016-11-22 | Disposition: A | Discharge status: Home / Self Care | Payer: Medicare Other | Source: Ambulatory Visit | Attending: Cardiology | Admitting: Cardiology

## 2016-11-21 DIAGNOSIS — R001 Bradycardia, unspecified: Secondary | ICD-10-CM | POA: Diagnosis not present

## 2016-11-21 DIAGNOSIS — Z79899 Other long term (current) drug therapy: Secondary | ICD-10-CM | POA: Insufficient documentation

## 2016-11-21 DIAGNOSIS — Z8249 Family history of ischemic heart disease and other diseases of the circulatory system: Secondary | ICD-10-CM | POA: Insufficient documentation

## 2016-11-21 DIAGNOSIS — I2510 Atherosclerotic heart disease of native coronary artery without angina pectoris: Secondary | ICD-10-CM | POA: Diagnosis present

## 2016-11-21 DIAGNOSIS — Z9049 Acquired absence of other specified parts of digestive tract: Secondary | ICD-10-CM | POA: Insufficient documentation

## 2016-11-21 DIAGNOSIS — Z7982 Long term (current) use of aspirin: Secondary | ICD-10-CM | POA: Insufficient documentation

## 2016-11-21 DIAGNOSIS — Z955 Presence of coronary angioplasty implant and graft: Secondary | ICD-10-CM

## 2016-11-21 DIAGNOSIS — Z803 Family history of malignant neoplasm of breast: Secondary | ICD-10-CM | POA: Diagnosis not present

## 2016-11-21 DIAGNOSIS — I130 Hypertensive heart and chronic kidney disease with heart failure and stage 1 through stage 4 chronic kidney disease, or unspecified chronic kidney disease: Secondary | ICD-10-CM | POA: Diagnosis not present

## 2016-11-21 DIAGNOSIS — Z87442 Personal history of urinary calculi: Secondary | ICD-10-CM | POA: Insufficient documentation

## 2016-11-21 DIAGNOSIS — I4510 Unspecified right bundle-branch block: Secondary | ICD-10-CM | POA: Diagnosis not present

## 2016-11-21 DIAGNOSIS — E785 Hyperlipidemia, unspecified: Secondary | ICD-10-CM | POA: Insufficient documentation

## 2016-11-21 DIAGNOSIS — Z96652 Presence of left artificial knee joint: Secondary | ICD-10-CM | POA: Diagnosis not present

## 2016-11-21 DIAGNOSIS — Z9861 Coronary angioplasty status: Secondary | ICD-10-CM

## 2016-11-21 DIAGNOSIS — M1990 Unspecified osteoarthritis, unspecified site: Secondary | ICD-10-CM | POA: Diagnosis not present

## 2016-11-21 DIAGNOSIS — N184 Chronic kidney disease, stage 4 (severe): Secondary | ICD-10-CM | POA: Insufficient documentation

## 2016-11-21 DIAGNOSIS — Z87440 Personal history of urinary (tract) infections: Secondary | ICD-10-CM | POA: Insufficient documentation

## 2016-11-21 DIAGNOSIS — I350 Nonrheumatic aortic (valve) stenosis: Secondary | ICD-10-CM | POA: Insufficient documentation

## 2016-11-21 DIAGNOSIS — Z01818 Encounter for other preprocedural examination: Secondary | ICD-10-CM

## 2016-11-21 DIAGNOSIS — I252 Old myocardial infarction: Secondary | ICD-10-CM | POA: Insufficient documentation

## 2016-11-21 DIAGNOSIS — R9439 Abnormal result of other cardiovascular function study: Secondary | ICD-10-CM | POA: Diagnosis present

## 2016-11-21 DIAGNOSIS — Z880 Allergy status to penicillin: Secondary | ICD-10-CM | POA: Insufficient documentation

## 2016-11-21 DIAGNOSIS — I509 Heart failure, unspecified: Secondary | ICD-10-CM | POA: Insufficient documentation

## 2016-11-21 DIAGNOSIS — Z888 Allergy status to other drugs, medicaments and biological substances status: Secondary | ICD-10-CM | POA: Diagnosis not present

## 2016-11-21 DIAGNOSIS — I25119 Atherosclerotic heart disease of native coronary artery with unspecified angina pectoris: Secondary | ICD-10-CM | POA: Diagnosis not present

## 2016-11-21 HISTORY — DX: Other chronic pain: G89.29

## 2016-11-21 HISTORY — DX: Chronic kidney disease, stage 4 (severe): N18.4

## 2016-11-21 HISTORY — PX: CORONARY ATHERECTOMY: CATH118238

## 2016-11-21 HISTORY — PX: LEFT HEART CATH: CATH118248

## 2016-11-21 HISTORY — DX: Personal history of urinary calculi: Z87.442

## 2016-11-21 HISTORY — DX: Atherosclerotic heart disease of native coronary artery without angina pectoris: I25.10

## 2016-11-21 HISTORY — PX: TEMPORARY PACEMAKER: CATH118268

## 2016-11-21 HISTORY — DX: Low back pain, unspecified: M54.50

## 2016-11-21 HISTORY — PX: CORONARY STENT INTERVENTION: CATH118234

## 2016-11-21 HISTORY — PX: CORONARY ANGIOPLASTY WITH STENT PLACEMENT: SHX49

## 2016-11-21 HISTORY — DX: Personal history of other diseases of the musculoskeletal system and connective tissue: Z87.39

## 2016-11-21 HISTORY — DX: Acute myocardial infarction, unspecified: I21.9

## 2016-11-21 HISTORY — DX: Low back pain: M54.5

## 2016-11-21 LAB — CBC
HEMATOCRIT: 45.3 % (ref 39.0–52.0)
Hemoglobin: 15.5 g/dL (ref 13.0–17.0)
MCH: 31.1 pg (ref 26.0–34.0)
MCHC: 34.2 g/dL (ref 30.0–36.0)
MCV: 90.8 fL (ref 78.0–100.0)
Platelets: 136 10*3/uL — ABNORMAL LOW (ref 150–400)
RBC: 4.99 MIL/uL (ref 4.22–5.81)
RDW: 13.6 % (ref 11.5–15.5)
WBC: 8.2 10*3/uL (ref 4.0–10.5)

## 2016-11-21 LAB — BASIC METABOLIC PANEL
Anion gap: 9 (ref 5–15)
BUN: 39 mg/dL — AB (ref 6–20)
CHLORIDE: 107 mmol/L (ref 101–111)
CO2: 24 mmol/L (ref 22–32)
Calcium: 9.5 mg/dL (ref 8.9–10.3)
Creatinine, Ser: 1.93 mg/dL — ABNORMAL HIGH (ref 0.61–1.24)
GFR calc Af Amer: 36 mL/min — ABNORMAL LOW (ref >60)
GFR, EST NON AFRICAN AMERICAN: 31 mL/min — AB (ref >60)
GLUCOSE: 93 mg/dL (ref 65–99)
POTASSIUM: 4 mmol/L (ref 3.5–5.1)
Sodium: 140 mmol/L (ref 135–145)

## 2016-11-21 LAB — POCT ACTIVATED CLOTTING TIME: ACTIVATED CLOTTING TIME: 571 s

## 2016-11-21 SURGERY — CORONARY ATHERECTOMY
Anesthesia: LOCAL

## 2016-11-21 MED ORDER — HEPARIN (PORCINE) IN NACL 2-0.9 UNIT/ML-% IJ SOLN
INTRAMUSCULAR | Status: DC | PRN
  Administered 2016-11-21: 1000 mL

## 2016-11-21 MED ORDER — ONDANSETRON HCL 4 MG/2ML IJ SOLN
4.0000 mg | Freq: Four times a day (QID) | INTRAMUSCULAR | Status: DC | PRN
  Administered 2016-11-21 – 2016-11-22 (×2): 4 mg via INTRAVENOUS
  Filled 2016-11-21 (×2): qty 2

## 2016-11-21 MED ORDER — SODIUM CHLORIDE 0.9% FLUSH: 3.0000 mL | INTRAVENOUS | Status: DC | PRN

## 2016-11-21 MED ORDER — IOPAMIDOL (ISOVUE-370) INJECTION 76%
INTRAVENOUS | Status: DC | PRN
  Administered 2016-11-21: 185 mL via INTRA_ARTERIAL

## 2016-11-21 MED ORDER — ATORVASTATIN CALCIUM 40 MG PO TABS
40.0000 mg | ORAL_TABLET | Freq: Every day | ORAL | Status: DC
  Administered 2016-11-21: 40 mg via ORAL
  Filled 2016-11-21: qty 1

## 2016-11-21 MED ORDER — ALLOPURINOL 300 MG PO TABS
300.0000 mg | ORAL_TABLET | Freq: Every day | ORAL | Status: DC
  Administered 2016-11-22: 10:00:00 300 mg via ORAL
  Filled 2016-11-21 (×3): qty 1

## 2016-11-21 MED ORDER — HEPARIN (PORCINE) IN NACL 2-0.9 UNIT/ML-% IJ SOLN
INTRAMUSCULAR | Status: AC
  Filled 2016-11-21: qty 1000

## 2016-11-21 MED ORDER — ASPIRIN EC 81 MG PO TBEC
81.0000 mg | DELAYED_RELEASE_TABLET | Freq: Every day | ORAL | Status: DC
  Administered 2016-11-22: 81 mg via ORAL
  Filled 2016-11-21 (×2): qty 1

## 2016-11-21 MED ORDER — BIVALIRUDIN BOLUS VIA INFUSION - CUPID
INTRAVENOUS | Status: DC | PRN
  Administered 2016-11-21: 85.05 mg via INTRAVENOUS

## 2016-11-21 MED ORDER — LIDOCAINE HCL (PF) 1 % IJ SOLN
INTRAMUSCULAR | Status: DC | PRN
  Administered 2016-11-21: 20 mL
  Administered 2016-11-21: 2 mL

## 2016-11-21 MED ORDER — SODIUM CHLORIDE 0.9 % IV SOLN
INTRAVENOUS | Status: DC
  Administered 2016-11-21: 11:00:00 via INTRAVENOUS

## 2016-11-21 MED ORDER — HYDRALAZINE HCL 20 MG/ML IJ SOLN: 5.0000 mg | INTRAMUSCULAR | Status: AC | PRN

## 2016-11-21 MED ORDER — ALUM & MAG HYDROXIDE-SIMETH 200-200-20 MG/5ML PO SUSP
30.0000 mL | ORAL | Status: DC | PRN
  Administered 2016-11-21: 23:00:00 30 mL via ORAL
  Filled 2016-11-21: qty 30

## 2016-11-21 MED ORDER — FENTANYL CITRATE (PF) 100 MCG/2ML IJ SOLN
INTRAMUSCULAR | Status: AC
  Filled 2016-11-21: qty 2

## 2016-11-21 MED ORDER — BIVALIRUDIN 250 MG IV SOLR
INTRAVENOUS | Status: AC
  Filled 2016-11-21: qty 250

## 2016-11-21 MED ORDER — SODIUM CHLORIDE 0.9 % IV SOLN
INTRAVENOUS | Status: DC | PRN
  Administered 2016-11-21: 10 mL/h via INTRAVENOUS

## 2016-11-21 MED ORDER — HEPARIN SODIUM (PORCINE) 1000 UNIT/ML IJ SOLN
INTRAMUSCULAR | Status: AC
  Filled 2016-11-21: qty 1

## 2016-11-21 MED ORDER — MIDAZOLAM HCL 2 MG/2ML IJ SOLN
INTRAMUSCULAR | Status: AC
  Filled 2016-11-21: qty 2

## 2016-11-21 MED ORDER — SODIUM CHLORIDE 0.9% FLUSH
3.0000 mL | INTRAVENOUS | Status: DC | PRN
Start: 2016-11-21 — End: 2016-11-22

## 2016-11-21 MED ORDER — LIDOCAINE HCL (PF) 1 % IJ SOLN
INTRAMUSCULAR | Status: AC
  Filled 2016-11-21: qty 30

## 2016-11-21 MED ORDER — ACETAMINOPHEN 325 MG PO TABS
650.0000 mg | ORAL_TABLET | ORAL | Status: DC | PRN
  Administered 2016-11-21: 23:00:00 650 mg via ORAL
  Filled 2016-11-21: qty 2

## 2016-11-21 MED ORDER — NITROGLYCERIN 1 MG/10 ML FOR IR/CATH LAB
INTRA_ARTERIAL | Status: DC | PRN
  Administered 2016-11-21 (×3): 200 ug via INTRACORONARY

## 2016-11-21 MED ORDER — SODIUM CHLORIDE 0.9% FLUSH
3.0000 mL | Freq: Two times a day (BID) | INTRAVENOUS | Status: DC
  Administered 2016-11-22: 3 mL via INTRAVENOUS

## 2016-11-21 MED ORDER — ANGIOPLASTY BOOK
Freq: Once | Status: AC
  Administered 2016-11-21: 21:00:00
  Filled 2016-11-21: qty 1

## 2016-11-21 MED ORDER — SODIUM CHLORIDE 0.9 % IV SOLN: INTRAVENOUS | Status: DC

## 2016-11-21 MED ORDER — MIDAZOLAM HCL 2 MG/2ML IJ SOLN
INTRAMUSCULAR | Status: DC | PRN
  Administered 2016-11-21 (×2): 1 mg via INTRAVENOUS

## 2016-11-21 MED ORDER — VIPERSLIDE LUBRICANT OPTIME
TOPICAL | Status: DC | PRN
  Administered 2016-11-21: 20 mL via SURGICAL_CAVITY

## 2016-11-21 MED ORDER — VERAPAMIL HCL 2.5 MG/ML IV SOLN
INTRAVENOUS | Status: AC
  Filled 2016-11-21: qty 2

## 2016-11-21 MED ORDER — SODIUM CHLORIDE 0.9 % IV SOLN
250.0000 mL | INTRAVENOUS | Status: DC | PRN
Start: 2016-11-21 — End: 2016-11-22

## 2016-11-21 MED ORDER — NITROGLYCERIN 1 MG/10 ML FOR IR/CATH LAB
INTRA_ARTERIAL | Status: AC
  Filled 2016-11-21: qty 10

## 2016-11-21 MED ORDER — SODIUM CHLORIDE 0.9 % IV SOLN: INTRAVENOUS | Status: AC

## 2016-11-21 MED ORDER — FENTANYL CITRATE (PF) 100 MCG/2ML IJ SOLN
INTRAMUSCULAR | Status: DC | PRN
  Administered 2016-11-21 (×3): 25 ug via INTRAVENOUS

## 2016-11-21 MED ORDER — VERAPAMIL HCL 2.5 MG/ML IV SOLN
INTRAVENOUS | Status: DC | PRN
  Administered 2016-11-21: 15:00:00 via INTRA_ARTERIAL

## 2016-11-21 MED ORDER — IOPAMIDOL (ISOVUE-370) INJECTION 76%
INTRAVENOUS | Status: AC
  Filled 2016-11-21: qty 125

## 2016-11-21 MED ORDER — TAMSULOSIN HCL 0.4 MG PO CAPS
0.4000 mg | ORAL_CAPSULE | Freq: Every day | ORAL | Status: DC
  Administered 2016-11-21: 0.4 mg via ORAL
  Filled 2016-11-21: qty 1

## 2016-11-21 MED ORDER — SODIUM CHLORIDE 0.9 % IV SOLN
INTRAVENOUS | Status: DC | PRN
  Administered 2016-11-21 (×2): 1.75 mg/kg/h via INTRAVENOUS

## 2016-11-21 MED ORDER — ATROPINE SULFATE 1 MG/10ML IJ SOSY
PREFILLED_SYRINGE | INTRAMUSCULAR | Status: AC
  Filled 2016-11-21: qty 10

## 2016-11-21 MED ORDER — CLOPIDOGREL BISULFATE 75 MG PO TABS
75.0000 mg | ORAL_TABLET | Freq: Every day | ORAL | Status: DC
  Administered 2016-11-22: 75 mg via ORAL
  Filled 2016-11-21: qty 1

## 2016-11-21 SURGICAL SUPPLY — 29 items
BALLN MAVERICK OTW 3.0X15 (BALLOONS) ×2
BALLN ~~LOC~~ EMERGE MR 3.5X20 (BALLOONS) ×2
BALLN ~~LOC~~ EUPHORA RX 4.0X15 (BALLOONS) ×2
BALLOON MAVERICK OTW 3.0X15 (BALLOONS) ×1 IMPLANT
BALLOON ~~LOC~~ EMERGE MR 3.5X20 (BALLOONS) ×1 IMPLANT
BALLOON ~~LOC~~ EUPHORA RX 4.0X15 (BALLOONS) ×1 IMPLANT
CATH LAUNCHER 6FR AL1 SH (CATHETERS) ×1 IMPLANT
CATH S G BIP PACING (SET/KITS/TRAYS/PACK) ×2 IMPLANT
CATHETER LAUNCHER 6FR AL1 SH (CATHETERS) ×2
COVER PRB 48X5XTLSCP FOLD TPE (BAG) ×1 IMPLANT
COVER PROBE 5X48 (BAG) ×1
CROWN DIAMONDBACK CLASSIC 1.25 (BURR) ×2 IMPLANT
DEVICE RAD COMP TR BAND LRG (VASCULAR PRODUCTS) ×2 IMPLANT
GLIDESHEATH SLEND A-KIT 6F 22G (SHEATH) ×2 IMPLANT
GUIDEWIRE INQWIRE 1.5J.035X260 (WIRE) ×1 IMPLANT
HOVERMATT SINGLE USE (MISCELLANEOUS) ×2 IMPLANT
INQWIRE 1.5J .035X260CM (WIRE) ×2
KIT ENCORE 26 ADVANTAGE (KITS) ×2 IMPLANT
KIT HEART LEFT (KITS) ×2 IMPLANT
LUBRICANT VIPERSLIDE CORONARY (MISCELLANEOUS) ×2 IMPLANT
PACK CARDIAC CATHETERIZATION (CUSTOM PROCEDURE TRAY) ×2 IMPLANT
SHEATH PINNACLE 6F 10CM (SHEATH) ×2 IMPLANT
STENT RESOLUTE ONYX 3.5X22 (Permanent Stent) ×2 IMPLANT
STENT RESOLUTE ONYX3.0X38 (Permanent Stent) ×2 IMPLANT
TRANSDUCER W/STOPCOCK (MISCELLANEOUS) ×2 IMPLANT
TUBING CIL FLEX 10 FLL-RA (TUBING) ×2 IMPLANT
WIRE ASAHI PROWATER 300CM (WIRE) ×4 IMPLANT
WIRE HI TORQ VERSACORE-J 145CM (WIRE) ×2 IMPLANT
WIRE VIPER ADVANCE COR .012TIP (WIRE) ×2 IMPLANT

## 2016-11-21 NOTE — Interval H&P Note (Signed)
History and Physical Interval Note:  11/21/2016 2:25 PM  [name]  has presented today for surgery, with the diagnosis of CAD  The various methods of treatment have been discussed with the patient and family. After consideration of risks, benefits and other options for treatment, the patient has consented to  Procedure(s): Coronary Atherectomy (N/A) as a surgical intervention .  The patient's history has been reviewed, patient examined, no change in status, stable for surgery.  I have reviewed the patient's chart and labs.  Questions were answered to the patient's satisfaction.     Glenetta Hew

## 2016-11-21 NOTE — H&P (View-Only) (Signed)
Cardiology Office Note    Date:  11/08/2016   ID:  [name], DOB [birth year], MRN [id number]  PCP:  Garret Reddish, MD  Cardiologist:   [name], MD   Chief Complaint  Patient presents with  . New Patient (Initial Visit)    SOB, HX of myocardial infarction    History of Present Illness:  [name] is a 83 y.o. male here for evaluation of prior myocardial infarction with shortness of breath. He's been feeling fatigue and shortness of breath, losartan was stopped without any improvement. No specific chest pain.  This shortness of breath seems to have happened quite suddenly to him. It is as if one day he felt his normal level of activity in the next day he felt shortness of breath. He does not remember having any incident chest pain. No significant long travels. He has chronic left lower extremity edema, prior knee replacement and prior leg injury. It is fairly significant when he goes up the stairs for instance, relieved with rest.  He has never smoked cigarettes, no alcohol, no coffee. Retired Nature conservation officer for 30 years.  He has chronic back and knee pain, Dr. Alfonso Ramus has seen him. He is scheduled for an MRI of his back next Tuesday.  His wife is [name]    Past Medical History:  Diagnosis Date  . Arthritis   . Bladder stones   . Chronic kidney disease   . Hyperlipidemia   . Hypertension   . KIDNEY STONE 12/02/2008  . PSA, INCREASED 12/05/2009  . UTI (urinary tract infection)     Past Surgical History:  Procedure Laterality Date  . CHOLECYSTECTOMY    . CYSTOSCOPY W/ STONE MANIPULATION    . EYE SURGERY     bil cat  . PILONIDAL CYST EXCISION  58  . TOTAL KNEE ARTHROPLASTY Left 09/22/2012   Procedure: TOTAL KNEE ARTHROPLASTY;  Surgeon: Lorn Junes, MD;  Location: [facility];  Service: Orthopedics;  Laterality: Left;    Current Medications: Outpatient Medications Prior to Visit  Medication Sig Dispense Refill  . aspirin 81 MG tablet Take 81 mg by mouth daily.    [name]  atorvastatin (LIPITOR) 40 MG tablet Take 1 tablet (40 mg total) by mouth daily. 90 tablet 3  . Tamsulosin HCl (FLOMAX) 0.4 MG CAPS Take 0.4 mg by mouth daily after supper.    [name] allopurinol (ZYLOPRIM) 300 MG tablet TAKE 1 TABLET DAILY (Patient not taking: Reported on 11/08/2016) 90 tablet 3  . losartan (COZAAR) 25 MG tablet TAKE 1 TABLET DAILY (Patient not taking: Reported on 11/08/2016) 90 tablet 2  . naproxen sodium (ANAPROX) 220 MG tablet Take 220 mg by mouth daily.     No facility-administered medications prior to visit.      Allergies:   Epinephrine; Lisinopril; and Penicillins   Social History   Social History  . Marital status: Married    Spouse name: N/A  . Number of children: N/A  . Years of education: N/A   Social History Main Topics  . Smoking status: Never Smoker  . Smokeless tobacco: Never Used  . Alcohol use 0.0 oz/week     Comment: no drink in 2 years-formerly wine  . Drug use: No  . Sexual activity: Yes    Partners: Female   Other Topics Concern  . None   Social History Narrative   Married (Wife [name] patient of Dr. Yong Channel). 2 sons. 4 grandkids.    Originally from near [place], [place]  Lives at [employer] 6-7 months of the year      Retired from [employer] long time. 27 years of service.       Hobbies: golf   Used to [employer], run marathons     Family History:  The patient's family history includes Breast cancer in his mother; Heart attack in his father.   ROS:   Please see the history of present illness.    ROS All other systems reviewed and are negative.   PHYSICAL EXAM:   VS:  BP (!) 120/58   Pulse (!) 56   Ht _0  (1.905 m)   Wt 255 lb 9.6 oz (115.9 kg)   BMI 31.95 kg/m    GEN: Well nourished, well developed, in no acute distress  HEENT: normal  Neck: no JVD, carotid bruits, or masses Cardiac: RRR; no murmurs, rubs, or gallops, Left leg edema chronic Respiratory:  clear to auscultation bilaterally, normal work of breathing GI:  soft, nontender, nondistended, + BS, overweight MS: no deformity or atrophy  Skin: warm and dry, no rash Neuro:  Alert and Oriented x 3, Strength and sensation are intact Psych: euthymic mood, full affect  Wt Readings from Last 3 Encounters:  11/08/16 255 lb 9.6 oz (115.9 kg)  11/01/16 258 lb (117 kg)  10/30/16 258 lb 9.6 oz (117.3 kg)      Studies/Labs Reviewed:   EKG: 10/18/16-heart rate 53 bpm sinus bradycardia with incomplete right bundle branch block, nonspecific ST-T wave flattening. Personally viewed.  Recent Labs: 10/18/2016: ALT 19; BUN 37; Creatinine, Ser 1.70; Hemoglobin 15.4; Platelets 161.0; Potassium 4.9; Pro B Natriuretic peptide (BNP) 46.0; Sodium 140; TSH 1.77   Lipid Panel    Component Value Date/Time   CHOL 145 01/19/2016 0902   TRIG 96.0 01/19/2016 0902   HDL 40.10 01/19/2016 0902   CHOLHDL 4 01/19/2016 0902   VLDL 19.2 01/19/2016 0902   LDLCALC 86 01/19/2016 0902   LDLDIRECT 79.0 12/08/2014 0905    Additional studies/ records that were reviewed today include:   ECHO 10/31/16 - Left ventricle: The cavity size was normal. Wall thickness was   increased in a pattern of moderate LVH. Systolic function was   normal. The estimated ejection fraction was in the range of 60%   to 65%. Wall motion was normal; there were no regional wall   motion abnormalities. Doppler parameters are consistent with   abnormal left ventricular relaxation (grade 1 diastolic   dysfunction). The E/e&' ratio is between 8-15, suggesting   indeterminate LV filling pressure. - Aortic valve: Mild aortic stenosis. There was trivial   regurgitation. Mean gradient (S): 13 mm Hg. Peak gradient (S): 24   mm Hg. Valve area (VTI): 1.98 cm^2. Valve area (Vmax): 1.77 cm^2. - Aorta: Aortic root dimension: 40 mm (ED). - Aortic root: The aortic root is mildly dilated. - Mitral valve: Calcified annulus. Mildly thickened leaflets .   There was trivial regurgitation. - Left atrium: The atrium was  normal in size. - Tricuspid valve: There was mild regurgitation. - Pulmonary arteries: PA peak pressure: 40 mm Hg (S) + RAP. - Systemic veins: The IVC was not visualized.  Impressions:  - LVEF 60-65%, moderate LVH, normal wall motion, diastolic   dysfunction with indeterminate LV filling pressure, mild aortic   stenosis - AVA 1.8-1.9 cm2, mildly dilated aortic root to 4.0 cm,   MAC with trivial MR, normal biatrial size, mild TR, RVSP 40 mmHg   + RAP, IVC not visualized, no pericardial  effusion.  NUC stress 11/01/16  Nuclear stress EF: 44%.  There was no ST segment deviation noted during stress.  The left ventricular ejection fraction is moderately decreased (30-44%).  This is an intermediate risk study.  Findings consistent with prior myocardial infarction.   Small mid and basal inferior wall infarct Small apical infarct No ischemia EF 44% diffuse hypokinesis   ASSESSMENT:    1. SOB (shortness of breath)   2. Essential hypertension   3. History of MI (myocardial infarction)   4. Pre-operative laboratory examination   5. Dilated aortic root ([place])   6. CKD (chronic kidney disease) stage 4, GFR 15-29 ml/min (HCC)      PLAN:  In order of problems listed above:  Dyspnea on exertion  - BNP was normal.  - Cardiomegaly noted on x-ray.  - Nuclear stress test did not show any evidence of ischemiaBut there was possible apical infarct as well as possible inferior infarct. Calculated ejection fraction was 44 percent on nuclear however was normal on echocardiogram.  - Given his infarct pattern on nuclear stress test and symptoms of dyspnea on exertion, we will proceed with right and left heart catheterization via the radial artery/brachial artery approach. Risks and benefits described including stroke, heart attack, death, renal impairment, bleeding. Creatinine 1.7-1.9. We will hydrate morning of procedure. He will hold his losartan.   Dilated aortic root  - 4.0. We will  continue to monitor. Continue to treat blood pressure.  Mild aortic stenosis  - Murmur heard on exam. Continue to monitor. Should not be of clinical significance causing shortness of breath at this point.  Chronic kidney disease stage 4  - We will hold his losartan the morning of and the day before his cardiac catheterization. I discussed with him risks.  - Hydrate morning of.  Chronic left lower extremity edema-prior knee replacement, prior injury in [place]  - If cardiac catheterization is unrevealing, one could consider evaluation for pulmonary embolism/chronic thromboembolic disease. We may wish to use a VQ scan given his chronic kidney disease.   Medication Adjustments/Labs and Tests Ordered: Current medicines are reviewed at length with the patient today.  Concerns regarding medicines are outlined above.  Medication changes, Labs and Tests ordered today are listed in the Patient Instructions below. Patient Instructions  Medication Instructions:  The current medical regimen is effective;  continue present plan and medications.  Labwork: Please have blood work today. (CBC, BMP and PT/INR)  Testing/Procedures: Your physician has requested that you have a cardiac catheterization. Cardiac catheterization is used to diagnose and/or treat various heart conditions. Doctors may recommend this procedure for a number of different reasons. The most common reason is to evaluate chest pain. Chest pain can be a symptom of coronary artery disease (CAD), and cardiac catheterization can show whether plaque is narrowing or blocking your heart's arteries. This procedure is also used to evaluate the valves, as well as measure the blood flow and oxygen levels in different parts of your heart. For further information please visit [email or web address]. Please follow instruction sheet, as given.  Follow-Up: Follow up approximately 2 weeks after your heart cath.  If you need a refill on your cardiac  medications before your next appointment, please call your pharmacy.  Thank you for choosing [facility]!!      [facility] OFFICE [address], [address] [place] [address] Dept: [phone] Loc: [phone]  DAWSEN KRIEGER  11/08/2016  You  are scheduled for a cardiac cath on Wednesday, 11/14/2016 with Dr. Claiborne Billings.  1. Please arrive at the [facility] (Main Entrance A) at [facility] Main: [address] [place], [place] [address] at 9:30am (two hours before your procedure to ensure your preparation). Free valet parking service is available.   Special note: Every effort is made to have your procedure done on time. Please understand that emergencies sometimes delay scheduled procedures.  2. Diet: Nothing to eat or drink after midnight.  3. Labs: Please have blood work today  4. Medication instructions in preparation for your procedure:  Please hold your Losartan the day before and the morning of the procedure.  On the morning of your procedure, take your ASA .  You may use sips of water.  5. Plan for one night stay--bring personal belongings. 6. Bring a current list of your medications and current insurance cards. 7. You MUST have a responsible person to drive you home. 8. Someone MUST be with you the first 24 hours after you arrive home or your discharge will be delayed. 9. Please wear clothes that are easy to get on and off and wear slip-on shoes.  Thank you for allowing [place] to care for you!   -- [facility] Cardiovascular services     Signed, [name], MD  11/08/2016 10:30 AM    [facility] [place], [place], [place]  [address] Phone: [phone]; Fax: [phone]

## 2016-11-22 ENCOUNTER — Telehealth: Payer: Self-pay | Admitting: Cardiology

## 2016-11-22 ENCOUNTER — Other Ambulatory Visit: Payer: Self-pay | Admitting: Cardiology

## 2016-11-22 ENCOUNTER — Encounter (HOSPITAL_COMMUNITY): Payer: Self-pay | Admitting: Cardiology

## 2016-11-22 DIAGNOSIS — I130 Hypertensive heart and chronic kidney disease with heart failure and stage 1 through stage 4 chronic kidney disease, or unspecified chronic kidney disease: Secondary | ICD-10-CM | POA: Diagnosis not present

## 2016-11-22 DIAGNOSIS — Z9861 Coronary angioplasty status: Secondary | ICD-10-CM

## 2016-11-22 DIAGNOSIS — Z87440 Personal history of urinary (tract) infections: Secondary | ICD-10-CM | POA: Diagnosis not present

## 2016-11-22 DIAGNOSIS — M1990 Unspecified osteoarthritis, unspecified site: Secondary | ICD-10-CM | POA: Diagnosis not present

## 2016-11-22 DIAGNOSIS — I25119 Atherosclerotic heart disease of native coronary artery with unspecified angina pectoris: Secondary | ICD-10-CM | POA: Diagnosis not present

## 2016-11-22 DIAGNOSIS — E785 Hyperlipidemia, unspecified: Secondary | ICD-10-CM | POA: Diagnosis not present

## 2016-11-22 DIAGNOSIS — Z96652 Presence of left artificial knee joint: Secondary | ICD-10-CM | POA: Diagnosis not present

## 2016-11-22 DIAGNOSIS — Z955 Presence of coronary angioplasty implant and graft: Secondary | ICD-10-CM | POA: Diagnosis not present

## 2016-11-22 DIAGNOSIS — Z7982 Long term (current) use of aspirin: Secondary | ICD-10-CM | POA: Diagnosis not present

## 2016-11-22 DIAGNOSIS — I509 Heart failure, unspecified: Secondary | ICD-10-CM | POA: Diagnosis not present

## 2016-11-22 DIAGNOSIS — I2510 Atherosclerotic heart disease of native coronary artery without angina pectoris: Secondary | ICD-10-CM | POA: Diagnosis not present

## 2016-11-22 DIAGNOSIS — I252 Old myocardial infarction: Secondary | ICD-10-CM | POA: Diagnosis not present

## 2016-11-22 DIAGNOSIS — N179 Acute kidney failure, unspecified: Secondary | ICD-10-CM

## 2016-11-22 DIAGNOSIS — I350 Nonrheumatic aortic (valve) stenosis: Secondary | ICD-10-CM | POA: Diagnosis not present

## 2016-11-22 DIAGNOSIS — Z87442 Personal history of urinary calculi: Secondary | ICD-10-CM | POA: Diagnosis not present

## 2016-11-22 DIAGNOSIS — R9439 Abnormal result of other cardiovascular function study: Secondary | ICD-10-CM | POA: Diagnosis not present

## 2016-11-22 DIAGNOSIS — N184 Chronic kidney disease, stage 4 (severe): Secondary | ICD-10-CM | POA: Diagnosis not present

## 2016-11-22 LAB — CBC
HCT: 43.7 % (ref 39.0–52.0)
HEMOGLOBIN: 15 g/dL (ref 13.0–17.0)
MCH: 31.1 pg (ref 26.0–34.0)
MCHC: 34.3 g/dL (ref 30.0–36.0)
MCV: 90.5 fL (ref 78.0–100.0)
Platelets: 135 10*3/uL — ABNORMAL LOW (ref 150–400)
RBC: 4.83 MIL/uL (ref 4.22–5.81)
RDW: 13.3 % (ref 11.5–15.5)
WBC: 9.3 10*3/uL (ref 4.0–10.5)

## 2016-11-22 LAB — BASIC METABOLIC PANEL
ANION GAP: 9 (ref 5–15)
BUN: 34 mg/dL — ABNORMAL HIGH (ref 6–20)
CALCIUM: 9 mg/dL (ref 8.9–10.3)
CO2: 22 mmol/L (ref 22–32)
Chloride: 109 mmol/L (ref 101–111)
Creatinine, Ser: 1.7 mg/dL — ABNORMAL HIGH (ref 0.61–1.24)
GFR, EST AFRICAN AMERICAN: 42 mL/min — AB (ref >60)
GFR, EST NON AFRICAN AMERICAN: 36 mL/min — AB (ref >60)
Glucose, Bld: 106 mg/dL — ABNORMAL HIGH (ref 65–99)
Potassium: 4.2 mmol/L (ref 3.5–5.1)
SODIUM: 140 mmol/L (ref 135–145)

## 2016-11-22 MED ORDER — PROCHLORPERAZINE EDISYLATE 5 MG/ML IJ SOLN
10.0000 mg | Freq: Four times a day (QID) | INTRAMUSCULAR | Status: DC | PRN
  Administered 2016-11-22: 10 mg via INTRAVENOUS
  Filled 2016-11-22: qty 2

## 2016-11-22 MED ORDER — LORAZEPAM 2 MG/ML IJ SOLN
0.5000 mg | Freq: Once | INTRAMUSCULAR | Status: AC
  Administered 2016-11-22: 0.5 mg via INTRAVENOUS
  Filled 2016-11-22: qty 1

## 2016-11-22 MED ORDER — SODIUM CHLORIDE 0.9 % IV SOLN: INTRAVENOUS | Status: DC

## 2016-11-22 NOTE — Progress Notes (Signed)
CARDIAC REHAB PHASE I   PRE:  Rate/Rhythm: 80 SR  BP:  Supine: 131/56  Sitting:   Standing:    SaO2: 91%RA  MODE:  Ambulation: 500 ft   POST:  Rate/Rhythm: 110-118 ST  BP:  Supine:   Sitting: 141/66  Standing:    SaO2: 91%RA 0800-0910 Pt walked 500 ft with steady gait. Held to side rail in hall occasionally. Denied CP or dizziness. Education completed with pt and wife who voiced understanding. Stressed importance of plavix with stent. Discussed ex ed, heart healthy food choices and NTG use. Referring to [place] Phase 2.    Graylon Good, RN BSN  11/22/2016 9:04 AM

## 2016-11-22 NOTE — Progress Notes (Unsigned)
bet

## 2016-11-22 NOTE — Progress Notes (Signed)
Site area: right groin  Site Prior to Removal:  Level 0  Pressure Applied For 18 MINUTES    Minutes Beginning at 19:40  Manual:   Yes.    Patient Status During Pull:  WNL  Post Pull Groin Site:  Level 0  Post Pull Instructions Given:  Yes.    Post Pull Pulses Present:  Yes.    Dressing Applied:  Yes.    Comments:  Pt tolerated well.

## 2016-11-22 NOTE — Discharge Summary (Signed)
Discharge Summary    Patient ID: [name],  MRN: [id number], DOB/AGE: Aug 29, 1933 83 y.o.  Admit date: 11/21/2016 Discharge date: 11/22/2016  Primary Care Provider: Garret Reddish Primary Cardiologist: Marlou Porch  Discharge Diagnoses    Principal Problem:   Abnormal nuclear stress test: prior infarct withe decreased EF Active Problems:   Coronary artery disease involving native coronary artery with angina pectoris [facility])   CAD S/P percutaneous coronary angioplasty   Allergies Allergies  Allergen Reactions  . Epinephrine Palpitations    "Rate races"  . Lisinopril     REACTION: cough  . Penicillins Hives    Has patient had a PCN reaction causing immediate rash, facial/tongue/throat swelling, SOB or lightheadedness with hypotension: No Has patient had a PCN reaction causing severe rash involving mucus membranes or skin necrosis: Yes Has patient had a PCN reaction that required hospitalization No Has patient had a PCN reaction occurring within the last 10 years: No If all of the above answers are "NO", then may proceed with Cephalosporin use.     Diagnostic Studies/Procedures    LHC: 11/21/16  Conclusion     LESION #1  Prox RCA lesion, 80 %stenosed.  Ost 1st Diag to 1st Diag lesion, 50 %stenosed. Stable.  Post intervention, there is a 0% residual stenosis.  After ORBITAL ATHERECTOMY, A STENT RESOLUTE ONYX 3.5X22 drug eluting stent was successfully placed.  Lesion #2  Prox LAD lesion, 70 %stenosed - calcified, crossed 1st Diag.  After ORBITAL ATHERECTOMY, A STENT RESOLUTE [id number] drug eluting stent was successfully placed.  Post intervention, there is a 0% residual stenosis.  __________________  LV end diastolic pressure is normal.   Successful 2 vessel oral atherectomy based DES PCI on RCA and LAD.   PLAN:  Overnight observation for IV hydration - continue 125 ML per hour for 10 hours, then reduce to 75 ML per hour overnight.  Continue dual  antiplatelet therapy for minimum one year (could stop aspirin after 2-3 months if necessary)  Closely monitor tomorrow or determine stability for discharge, if renal function is stable, he should be fine for discharge tomorrow with plan to check an outpatient chemical panel early next week.   _____________   History of Present Illness     [name] is a 83 y.o. male seen in the office for evaluation of prior myocardial infarction with shortness of breath by Dr. Marlou Porch on 11/08/16. He had been feeling fatigue and shortness of breath, losartan was stopped without any improvement. No specific chest pain. This shortness of breath seems to have happened quite suddenly to him. It is as if one day he felt his normal level of activity in the next day he felt shortness of breath. He does not remember having any incident chest pain. No significant long travels. He has chronic left lower extremity edema, prior knee replacement and prior leg injury. It is fairly significant when he goes up the stairs for instance, relieved with rest. He has never smoked cigarettes, no alcohol, no coffee. Retired Nature conservation officer for 30 years. He has chronic back and knee pain, Dr. Alfonso Ramus has seen him. He had a normal stress test without ischemia but there was possible apical infarct as well as possible inferior infarct. Calculated ejection fraction was 44 percent on nuclear however was normal on echocardiogram. He underwent cardiac cath on 11/14/16 showing 2 vessel disease in the LAD/RCA.  Planned for rotational atherectomy, and was discharged home on plavix load with ASA.   Hospital Course  Presented back for [facility] on 11/21/16 with successful rotational atherectomy to both the RCA and LAD with DES placed to each lesion. He was hydrated overnight. Plan to continue with DAPT with ASA/plavix. The following morning his labs were stable, Cr down to 1.7, Hgb 15.0. He did have some intermittent nausea overnight, but improved by the morning.  Considered starting low dose BB this admission but will defer to outpatient follow up and primary cardiologist. Will have he come in for BMET on 4/16.   He was seen by Dr. Gwenlyn Found on 11/22/16 and determined stable for discharge home.   Physical Exam: (per MD)  GEN: Well nourished, well developed, in no acute distress  HEENT: normal  Neck: no JVD, carotid bruits, or masses Cardiac: RRR; no murmurs, rubs, or gallops, Left leg edema chronic Respiratory:  clear to auscultation bilaterally, normal work of breathing GI: soft, nontender, nondistended, + BS, overweight MS: no deformity or atrophy. Right radial/femoral cath sites stable.  Skin: warm and dry, no rash Neuro:  Alert and Oriented x 3, Strength and sensation are intact Psych: euthymic mood, full affect _____________  Discharge Vitals Blood pressure (!) 131/56, pulse 76, temperature 98 F (36.7 C), temperature source Oral, resp. rate (!) 22, height 6\' 3"  (1.905 m), weight 250 lb (113.4 kg), SpO2 91 %.  Filed Weights   11/21/16 0948 11/22/16 0403  Weight: 250 lb (113.4 kg) 250 lb (113.4 kg)    Labs & Radiologic Studies    CBC  Recent Labs  11/21/16 1046 11/22/16 0126  WBC 8.2 9.3  HGB 15.5 15.0  HCT 45.3 43.7  MCV 90.8 90.5  PLT 136* 834*   Basic Metabolic Panel  Recent Labs  11/21/16 1046 11/22/16 0126  NA 140 140  K 4.0 4.2  CL 107 109  CO2 24 22  GLUCOSE 93 106*  BUN 39* 34*  CREATININE 1.93* 1.70*  CALCIUM 9.5 9.0   Liver Function Tests No results for input(s): AST, ALT, ALKPHOS, BILITOT, PROT, ALBUMIN in the last 72 hours. No results for input(s): LIPASE, AMYLASE in the last 72 hours. Cardiac Enzymes No results for input(s): CKTOTAL, CKMB, CKMBINDEX, TROPONINI in the last 72 hours. BNP Invalid input(s): POCBNP D-Dimer No results for input(s): DDIMER in the last 72 hours. Hemoglobin A1C No results for input(s): HGBA1C in the last 72 hours. Fasting Lipid Panel No results for input(s): CHOL, HDL,  LDLCALC, TRIG, CHOLHDL, LDLDIRECT in the last 72 hours. Thyroid Function Tests No results for input(s): TSH, T4TOTAL, T3FREE, THYROIDAB in the last 72 hours.  Invalid input(s): FREET3 _____________  No results found. Disposition   Pt is being discharged home today in good condition.  Follow-up Plans & Appointments    Follow-up Information    Cecilie Kicks, NP Follow up on 12/04/2016.   Specialties:  Cardiology, Radiology Why:  at 10:30am for your follow up appt.  Contact information: [place] [id number] [phone]        [place] [facility] Follow up on 11/26/2016.   Specialty:  Cardiology Why:  please come in for labs Contact information: [address] [place] [phone]         Discharge Instructions    AMB Referral to Cardiac Rehabilitation - Phase II    Complete by:  As directed    Diagnosis:   Coronary Stents Heart Failure (see criteria below if ordering Phase II)     Heart Failure Type:  Chronic Systolic & Diastolic   Amb Referral to Cardiac Rehabilitation    Complete by:  As directed    Diagnosis:  Coronary Stents   Call MD for:  redness, tenderness, or signs of infection (pain, swelling, redness, odor or green/yellow discharge around incision site)    Complete by:  As directed    Diet - low sodium heart healthy    Complete by:  As directed    Discharge instructions    Complete by:  As directed    Radial Site Care Refer to this sheet in the next few weeks. These instructions provide you with information on caring for yourself after your procedure. Your caregiver may also give you more specific instructions. Your treatment has been planned according to current medical practices, but problems sometimes occur. Call your caregiver if you have any problems or questions after your procedure. HOME CARE INSTRUCTIONS You may shower the day after the procedure.Remove the bandage  (dressing) and gently wash the site with plain soap and water.Gently pat the site dry.  Do not apply powder or lotion to the site.  Do not submerge the affected site in water for 3 to 5 days.  Inspect the site at least twice daily.  Do not flex or bend the affected arm for 24 hours.  No lifting over 5 pounds (2.3 kg) for 5 days after your procedure.  Do not drive home if you are discharged the same day of the procedure. Have someone else drive you.  You may drive 24 hours after the procedure unless otherwise instructed by your caregiver.  What to expect: Any bruising will usually fade within 1 to 2 weeks.  Blood that collects in the tissue (hematoma) may be painful to the touch. It should usually decrease in size and tenderness within 1 to 2 weeks.  SEEK IMMEDIATE MEDICAL CARE IF: You have unusual pain at the radial site.  You have redness, warmth, swelling, or pain at the radial site.  You have drainage (other than a small amount of blood on the dressing).  You have chills.  You have a fever or persistent symptoms for more than 72 hours.  You have a fever and your symptoms suddenly get worse.  Your arm becomes pale, cool, tingly, or numb.  You have heavy bleeding from the site. Hold pressure on the site.   Groin Site Care Refer to this sheet in the next few weeks. These instructions provide you with information on caring for yourself after your procedure. Your caregiver may also give you more specific instructions. Your treatment has been planned according to current medical practices, but problems sometimes occur. Call your caregiver if you have any problems or questions after your procedure. HOME CARE INSTRUCTIONS You may shower 24 hours after the procedure. Remove the bandage (dressing) and gently wash the site with plain soap and water. Gently pat the site dry.  Do not apply powder or lotion to the site.  Do not sit in a bathtub, swimming pool, or whirlpool for 5 to 7 days.  No  bending, squatting, or lifting anything over 10 pounds (4.5 kg) as directed by your caregiver.  Inspect the site at least twice daily.  Do not drive home if you are discharged the same day of the procedure. Have someone else drive you.  You may drive 24 hours after the procedure unless otherwise instructed by your caregiver.  What to expect: Any bruising will usually fade within 1 to 2 weeks.  Blood that  collects in the tissue (hematoma) may be painful to the touch. It should usually decrease in size and tenderness within 1 to 2 weeks.  SEEK IMMEDIATE MEDICAL CARE IF: You have unusual pain at the groin site or down the affected leg.  You have redness, warmth, swelling, or pain at the groin site.  You have drainage (other than a small amount of blood on the dressing).  You have chills.  You have a fever or persistent symptoms for more than 72 hours.  You have a fever and your symptoms suddenly get worse.  Your leg becomes pale, cool, tingly, or numb.  You have heavy bleeding from the site. Hold pressure on the site. .   Increase activity slowly    Complete by:  As directed       Discharge Medications   Current Discharge Medication List    CONTINUE these medications which have NOT CHANGED   Details  allopurinol (ZYLOPRIM) 300 MG tablet Take 300 mg by mouth daily.    aspirin 81 MG tablet Take 81 mg by mouth daily.    atorvastatin (LIPITOR) 40 MG tablet Take 1 tablet (40 mg total) by mouth daily. Qty: 90 tablet, Refills: 3    clopidogrel (PLAVIX) 75 MG tablet Take 1 tablet (75 mg total) by mouth daily. Qty: 30 tablet, Refills: 6    diphenhydramine-acetaminophen (TYLENOL PM) 25-500 MG TABS tablet Take 1 tablet by mouth at bedtime as needed (sleep).    losartan (COZAAR) 25 MG tablet Take 25 mg by mouth daily.    Tamsulosin HCl (FLOMAX) 0.4 MG CAPS Take 0.4 mg by mouth at bedtime.          Aspirin prescribed at discharge?  Yes High Intensity Statin Prescribed? (Lipitor 40-80mg   or Crestor 20-40mg ): Yes Beta Blocker Prescribed? No: Consider at outpatient follow up For EF <40%, was ACEI/ARB Prescribed? Yes ADP Receptor Inhibitor Prescribed? (i.e. Plavix etc.-Includes Medically Managed Patients): Yes For EF <40%, Aldosterone Inhibitor Prescribed? No: Ef ok Was EF assessed during THIS hospitalization? Yes Was Cardiac Rehab II ordered? (Included Medically managed Patients): Yes   Outstanding Labs/Studies   BMET 11/26/16  Duration of Discharge Encounter   Greater than 30 minutes including physician time.  Signed, Reino Bellis NP-C 11/22/2016, 10:33 AM

## 2016-11-22 NOTE — Care Management Note (Signed)
Case Management Note  Patient Details  Name: ASHFORD CLOUSE MRN: [id number] Date of Birth: Dec 16, 1933  Subjective/Objective:   s/p coronary stent atherectomy, will be on plavix, NCM will cont to follow for dc needs.                  Action/Plan:   Expected Discharge Date:                  Expected Discharge Plan:  Home/Self Care  In-House Referral:     Discharge planning Services  CM Consult  Post Acute Care Choice:    Choice offered to:     DME Arranged:    DME Agency:     HH Arranged:    HH Agency:     Status of Service:  In process, will continue to follow  If discussed at Long Length of Stay Meetings, dates discussed:    Additional Comments:  Zenon Mayo, RN 11/22/2016, 8:59 AM

## 2016-11-22 NOTE — Progress Notes (Signed)
TR BAND REMOVAL  LOCATION:    right radial  DEFLATED PER PROTOCOL:    Yes.    TIME BAND OFF / DRESSING APPLIED:    21:30   SITE UPON ARRIVAL:    Level 0  SITE AFTER BAND REMOVAL:    Level 0  CIRCULATION SENSATION AND MOVEMENT:    Within Normal Limits   Yes.    COMMENTS:   Post TR band instructions given. Pt tolerated well. 

## 2016-11-22 NOTE — Telephone Encounter (Signed)
New message    TOC appt per Ria Comment with Cecilie Kicks on 12/04/16 at 10:30am.

## 2016-11-23 ENCOUNTER — Telehealth (HOSPITAL_COMMUNITY): Payer: Self-pay | Admitting: Family Medicine

## 2016-11-23 NOTE — Telephone Encounter (Signed)
Patient contacted regarding discharge from [facility] on 11/22/16  Patient understands to follow up with Cecilie Kicks, NP on 12/04/16 at 10:30 AM at [facility]. Patient understands discharge instructions? yes Patient understands medications and regiment? yes Patient understands to bring all medications to this visit? yes

## 2016-11-23 NOTE — Telephone Encounter (Signed)
Verified Medicare A/B & [employer] insurance benefits through [employer]. Medicare guidelines follow by [facility].... Reference [phone] & [phone].... KJ

## 2016-11-26 ENCOUNTER — Other Ambulatory Visit: Payer: Medicare Other | Admitting: *Deleted

## 2016-11-26 DIAGNOSIS — N179 Acute kidney failure, unspecified: Secondary | ICD-10-CM | POA: Diagnosis not present

## 2016-11-27 LAB — BASIC METABOLIC PANEL
BUN/Creatinine Ratio: 15 (ref 10–24)
BUN: 25 mg/dL (ref 8–27)
CALCIUM: 9.6 mg/dL (ref 8.6–10.2)
CHLORIDE: 99 mmol/L (ref 96–106)
CO2: 23 mmol/L (ref 18–29)
Creatinine, Ser: 1.65 mg/dL — ABNORMAL HIGH (ref 0.76–1.27)
GFR calc Af Amer: 44 mL/min/{1.73_m2} — ABNORMAL LOW (ref >59)
GFR calc non Af Amer: 38 mL/min/{1.73_m2} — ABNORMAL LOW (ref >59)
GLUCOSE: 84 mg/dL (ref 65–99)
Potassium: 4.2 mmol/L (ref 3.5–5.2)
SODIUM: 141 mmol/L (ref 134–144)

## 2016-11-29 ENCOUNTER — Other Ambulatory Visit: Payer: Self-pay

## 2016-11-29 MED ORDER — CLOPIDOGREL BISULFATE 75 MG PO TABS: 75.0000 mg | ORAL_TABLET | Freq: Every day | ORAL | 6 refills | Status: DC

## 2016-12-04 ENCOUNTER — Encounter: Payer: Self-pay | Admitting: Cardiology

## 2016-12-04 ENCOUNTER — Ambulatory Visit (INDEPENDENT_AMBULATORY_CARE_PROVIDER_SITE_OTHER): Payer: Medicare Other | Admitting: Cardiology

## 2016-12-04 VITALS — BP 132/64 | HR 71 | Ht 75.0 in | Wt 253.0 lb

## 2016-12-04 DIAGNOSIS — I10 Essential (primary) hypertension: Secondary | ICD-10-CM | POA: Diagnosis not present

## 2016-12-04 DIAGNOSIS — I25119 Atherosclerotic heart disease of native coronary artery with unspecified angina pectoris: Secondary | ICD-10-CM

## 2016-12-04 DIAGNOSIS — I350 Nonrheumatic aortic (valve) stenosis: Secondary | ICD-10-CM | POA: Diagnosis not present

## 2016-12-04 DIAGNOSIS — I209 Angina pectoris, unspecified: Secondary | ICD-10-CM

## 2016-12-04 DIAGNOSIS — I77810 Thoracic aortic ectasia: Secondary | ICD-10-CM

## 2016-12-04 DIAGNOSIS — R0602 Shortness of breath: Secondary | ICD-10-CM | POA: Diagnosis not present

## 2016-12-04 MED ORDER — NITROGLYCERIN 0.4 MG SL SUBL: 0.4000 mg | SUBLINGUAL_TABLET | SUBLINGUAL | 11 refills | Status: DC | PRN

## 2016-12-04 MED ORDER — ISOSORBIDE MONONITRATE ER 30 MG PO TB24: 30.0000 mg | ORAL_TABLET | Freq: Every day | ORAL | 6 refills | Status: DC

## 2016-12-04 NOTE — Patient Instructions (Addendum)
Take Imdur 30 mg daily   Take NTG under tongue as needed for chest pain    Appointment with Dr.Skains Wednesday 12/26/16 at 11:00 am

## 2016-12-04 NOTE — Progress Notes (Signed)
Cardiology Office Note   Date:  12/04/2016   ID:  [name], DOB Dec 04, 1933, MRN [id number]  PCP:  Garret Reddish, MD  Cardiologist:  Dr. Marlou Porch    Chief Complaint  Patient presents with  . Shortness of Breath      History of Present Illness: [name] is a 83 y.o. male who presents for post hospital follow up for orbital rotation atherectomy , with DES stents, RCA and LAD.    Status post complex diamondback orbital rotation arthrectomy, PCI and drug-eluting stenting of the RCA and LAD by Dr. Ellyn Hack. Excellent angiographic results. Right radial puncture site appears intact. Labs are alright as well. He is on dual antiplatelet therapy. Follow-up with Dr. Marlou Porch as an outpatient.  Was originally seen for SOB and nuc study with EF 44% and small mid and basal inf. Wall infarct.  And echo with EF 60-65%, G1DD, mild aortic stenosis. aortic root mildly dilated.  PA pk pressure in 40 mm HG, mod LVH.    Cardiac cath with LAD 70% stenosed RCA 80 % stenosed and EF 55-65% and LVEDP is normal  RHC Fick Cardiac Output 5.82 L/min  Fick Cardiac Output Index 2.41 (L/min)/BSA  RA A Wave 9 mmHg  RA V Wave 9 mmHg  RA Mean 7 mmHg  RV Systolic Pressure 42 mmHg  RV Diastolic Pressure 0 mmHg  RV EDP 7 mmHg  PA Systolic Pressure 35 mmHg  PA Diastolic Pressure 14 mmHg  PA Mean 20 mmHg  PW A Wave 21 mmHg  PW V Wave 18 mmHg  PW Mean 15 mmHg  AO Systolic Pressure 0 mmHg  AO Diastolic Pressure 0 mmHg  AO Mean 0 mmHg  LV Systolic Pressure 941 mmHg  LV Diastolic Pressure 4 mmHg  LV EDP 11 mmHg  Arterial Occlusion Pressure Extended Systolic Pressure 740 mmHg  Arterial Occlusion Pressure Extended Diastolic Pressure 68 mmHg  Arterial Occlusion Pressure Extended Mean Pressure 103 mmHg  Left Ventricular Apex Extended Systolic Pressure 814 mmHg  Left Ventricular Apex Extended Diastolic Pressure 4 mmHg  Left Ventricular Apex Extended EDP Pressure 14 mmHg  QP/QS 1  TPVR Index 8.29 HRUI    TSVR Index 36.86 HRUI  PVR SVR Ratio 0.06  TPVR/TSVR Ratio 0.22   Plan was to bring back for PCI which pt underwent on 11/22/16 which was successful.  For both the LAD and RCA.   He did well and is on ASA and Plavix.  Follow up lab a week later was stable.   Today pt still has DOE, no change since procedure.  He is taking meds approp.       Past Medical History:  Diagnosis Date  . Arthritis    "back, knees" (11/21/2016)  . Bladder stones   . CAD (coronary artery disease)    11/20/16 PCI with DES--> LAD/RCA  . Chronic lower back pain   . CKD (chronic kidney disease), stage IV ([place])    [name] 11/08/2016  . History of gout   . History of kidney stones 12/02/2008  . Hyperlipidemia   . Hypertension   . Myocardial infarction [facility])    "silent" (11/21/2016)  . PSA, INCREASED 12/05/2009  . UTI (urinary tract infection)     Past Surgical History:  Procedure Laterality Date  . CARDIAC CATHETERIZATION  11/14/2016  . CATARACT EXTRACTION W/ INTRAOCULAR LENS  IMPLANT, BILATERAL    . CORONARY ANGIOPLASTY WITH STENT PLACEMENT  11/21/2016   "2 stents"   . CORONARY ATHERECTOMY N/A 11/21/2016  Procedure: Coronary Atherectomy;  Surgeon: Leonie Man, MD;  Location: [place] [facility];  Service: Cardiovascular;  Laterality: N/A;  RCA and LAD  . CORONARY STENT INTERVENTION N/A 11/21/2016   Procedure: Coronary Stent Intervention;  Surgeon: Leonie Man, MD;  Location: [place] [facility];  Service: Cardiovascular;  Laterality: N/A;  RCA and LAD  . CYSTOSCOPY W/ STONE MANIPULATION    . JOINT REPLACEMENT    . KNEE ARTHROSCOPY Left   . LAPAROSCOPIC CHOLECYSTECTOMY    . LEFT HEART CATH N/A 11/21/2016   Procedure: Left Heart Cath;  Surgeon: Leonie Man, MD;  Location: [place] [facility];  Service: Cardiovascular;  Laterality: N/A;  . LITHOTRIPSY    . PILONIDAL CYST EXCISION  56  . RIGHT/LEFT HEART CATH AND CORONARY ANGIOGRAPHY N/A 11/14/2016   Procedure: Right/Left Heart Cath and Coronary  Angiography;  Surgeon: Leonie Man, MD;  Location: [place] [facility];  Service: Cardiovascular;  Laterality: N/A;  . TEMPORARY PACEMAKER N/A 11/21/2016   Procedure: Temporary Pacemaker;  Surgeon: Leonie Man, MD;  Location: [place] [facility];  Service: Cardiovascular;  Laterality: N/A;  . TONSILLECTOMY    . TOTAL KNEE ARTHROPLASTY Left 09/22/2012   Procedure: TOTAL KNEE ARTHROPLASTY;  Surgeon: Lorn Junes, MD;  Location: [facility];  Service: Orthopedics;  Laterality: Left;     Current Outpatient Prescriptions  Medication Sig Dispense Refill  . allopurinol (ZYLOPRIM) 300 MG tablet Take 300 mg by mouth daily.    [name] aspirin 81 MG tablet Take 81 mg by mouth daily.    [name] atorvastatin (LIPITOR) 40 MG tablet Take 1 tablet (40 mg total) by mouth daily. (Patient taking differently: Take 40 mg by mouth at bedtime. ) 90 tablet 3  . clopidogrel (PLAVIX) 75 MG tablet Take 1 tablet (75 mg total) by mouth daily. 30 tablet 6  . diphenhydramine-acetaminophen (TYLENOL PM) 25-500 MG TABS tablet Take 1 tablet by mouth at bedtime as needed (sleep).    . losartan (COZAAR) 25 MG tablet Take 25 mg by mouth daily.    . Tamsulosin HCl (FLOMAX) 0.4 MG CAPS Take 0.4 mg by mouth at bedtime.     . isosorbide mononitrate (IMDUR) 30 MG 24 hr tablet Take 1 tablet (30 mg total) by mouth daily. 30 tablet 6  . nitroGLYCERIN (NITROSTAT) 0.4 MG SL tablet Place 1 tablet (0.4 mg total) under the tongue every 5 (five) minutes as needed for chest pain. 25 tablet 11   No current facility-administered medications for this visit.     Allergies:   Epinephrine; Lisinopril; and Penicillins    Social History:  The patient  reports that he has never smoked. He has never used smokeless tobacco. He reports that he drinks alcohol. He reports that he does not use drugs.   Family History:  The patient's family history includes Breast cancer in his mother; Heart attack in his father.    ROS:  General:no colds or fevers, no weight  changes Skin:no rashes or ulcers HEENT:no blurred vision, no congestion CV:see HPI PUL:see HPI GI:no diarrhea constipation or melena, no indigestion GU:no hematuria, no dysuria MS:no joint pain, no claudication Neuro:no syncope, no lightheadedness Endo:no diabetes, no thyroid disease  Wt Readings from Last 3 Encounters:  12/04/16 253 lb (114.8 kg)  11/22/16 250 lb (113.4 kg)  11/14/16 250 lb (113.4 kg)     PHYSICAL EXAM: VS:  BP 132/64   Pulse 71   Ht 6\' 3"  (1.905 m)   Wt  253 lb (114.8 kg)   BMI 31.62 kg/m  , BMI Body mass index is 31.62 kg/m. General:Pleasant affect, NAD Skin:Warm and dry, brisk capillary refill HEENT:normocephalic, sclera clear, mucus membranes moist Neck:supple, no JVD, no bruits  Heart:S1S2 RRR without murmur, gallup, rub or click Lungs:clear without rales, rhonchi, or wheezes XBJ:YNWG, non tender, + BS, do not palpate liver spleen or masses Ext:no lower ext edema, 2+ pedal pulses, 2+ radial pulses Neuro:alert and oriented X 3, MAE, follows commands, + facial symmetry    EKG:  EKG is ordered today. The ekg ordered today demonstrates SR at 71 LAFB but no acute changes from previous.    Recent Labs: 10/18/2016: ALT 19; Pro B Natriuretic peptide (BNP) 46.0; TSH 1.77 11/22/2016: Hemoglobin 15.0; Platelets 135 11/26/2016: BUN 25; Creatinine, Ser 1.65; Potassium 4.2; Sodium 141    Lipid Panel    Component Value Date/Time   CHOL 145 01/19/2016 0902   TRIG 96.0 01/19/2016 0902   HDL 40.10 01/19/2016 0902   CHOLHDL 4 01/19/2016 0902   VLDL 19.2 01/19/2016 0902   LDLCALC 86 01/19/2016 0902   LDLDIRECT 79.0 12/08/2014 0905       Other studies Reviewed: Additional studies/ records that were reviewed today include: . Cardiac cath 11/14/16  Conclusion     Moderate to Heavily Calcified Coronary Arteries  Prox LAD lesion, 70 %stenosed. Moderately calcified - bifurcation lesion (1,1,1)  Ost 1st Diag to 1st Diag lesion, 60 %stenosed.  Prox RCA  lesion, 80 %stenosed. Heavily Calcified.  The left ventricular systolic function is normal. The left ventricular ejection fraction is 55-65% by visual estimate.  LV end diastolic pressure is normal.   The patient has severe 2 vessel disease involving the LAD, and RCA. Based on the amount calcification, think he is best served with staged atherectomy-based PCI on these vessels.  He will be discharged today after a Plavix load. We'll schedule him for next week as staged PCI.  Otherwise continue current medications.  PCI 11/22/16 Conclusion     LESION #1  Prox RCA lesion, 80 %stenosed.  Ost 1st Diag to 1st Diag lesion, 50 %stenosed. Stable.  Post intervention, there is a 0% residual stenosis.  After ORBITAL ATHERECTOMY, A STENT RESOLUTE ONYX 3.5X22 drug eluting stent was successfully placed.  Lesion #2  Prox LAD lesion, 70 %stenosed - calcified, crossed 1st Diag.  After ORBITAL ATHERECTOMY, A STENT RESOLUTE NFAO1.3Y86 drug eluting stent was successfully placed.  Post intervention, there is a 0% residual stenosis.  __________________  LV end diastolic pressure is normal.   Successful 2 vessel oral atherectomy based DES PCI on RCA and LAD.   PLAN:  Overnight observation for IV hydration - continue 125 ML per hour for 10 hours, then reduce to 75 ML per hour overnight.  Continue dual antiplatelet therapy for minimum one year (could stop aspirin after 2-3 months if necessary)  Closely monitor tomorrow or determine stability for discharge, if renal function is stable, he should be fine for discharge tomorrow with plan to check an outpatient chemical panel early next week.          ASSESSMENT AND PLAN:  1.  DOE continues, no change, his PCP plans on CTA of chest which I agree with - I discussed with Dr. Marlou Porch for now will add imdur 30 mg to see if this helps. We feel the procedure went well.  I have encouraged pt to go to rehab.  Maybe his BP with activity is  elevated.   2. CAD  with stents to LAD and RCA.  Continue asa and plavix.  I have also added SL NTG to pt's meds.  Currently not on BB due to bradycardia with HR at 53,   3. HLD on statin.   4. Dilated aortic root.  Follow with echo   I'll have pt follow up with Dr. Marlou Porch in 4 weeks to see if improved with imdur.     Current medicines are reviewed with the patient today.  The patient Has no concerns regarding medicines.  The following changes have been made:  See above Labs/ tests ordered today include:see above  Disposition:   FU:  see above  Signed, Cecilie Kicks, NP  12/04/2016 6:57 PM    [address], [place], [place], [place] Phone: [phone]; Fax: [phone]

## 2016-12-06 ENCOUNTER — Other Ambulatory Visit: Payer: Self-pay | Admitting: Family Medicine

## 2016-12-06 NOTE — Progress Notes (Signed)
Called and spoke with patient. He was driving. He states he will call back in about 30 minutes to schedule an appointment. He did state that he followed up with the cardiologist.

## 2016-12-14 ENCOUNTER — Ambulatory Visit (INDEPENDENT_AMBULATORY_CARE_PROVIDER_SITE_OTHER): Payer: Medicare Other | Admitting: Family Medicine

## 2016-12-14 ENCOUNTER — Encounter: Payer: Self-pay | Admitting: Family Medicine

## 2016-12-14 VITALS — BP 106/56 | HR 59 | Temp 98.3°F | Ht 75.0 in | Wt 255.2 lb

## 2016-12-14 DIAGNOSIS — N401 Enlarged prostate with lower urinary tract symptoms: Secondary | ICD-10-CM | POA: Diagnosis not present

## 2016-12-14 DIAGNOSIS — R351 Nocturia: Secondary | ICD-10-CM | POA: Diagnosis not present

## 2016-12-14 DIAGNOSIS — I25119 Atherosclerotic heart disease of native coronary artery with unspecified angina pectoris: Secondary | ICD-10-CM | POA: Diagnosis not present

## 2016-12-14 DIAGNOSIS — I10 Essential (primary) hypertension: Secondary | ICD-10-CM | POA: Diagnosis not present

## 2016-12-14 DIAGNOSIS — R0602 Shortness of breath: Secondary | ICD-10-CM

## 2016-12-14 LAB — BASIC METABOLIC PANEL
BUN: 38 mg/dL — AB (ref 6–23)
CALCIUM: 9.7 mg/dL (ref 8.4–10.5)
CO2: 28 meq/L (ref 19–32)
Chloride: 105 mEq/L (ref 96–112)
Creatinine, Ser: 1.86 mg/dL — ABNORMAL HIGH (ref 0.40–1.50)
GFR: 37.24 mL/min — ABNORMAL LOW (ref >60.00)
GLUCOSE: 91 mg/dL (ref 70–99)
POTASSIUM: 4.6 meq/L (ref 3.5–5.1)
SODIUM: 139 meq/L (ref 135–145)

## 2016-12-14 LAB — PSA: PSA: 8.16 ng/mL — ABNORMAL HIGH (ref 0.10–4.00)

## 2016-12-14 NOTE — Patient Instructions (Signed)
Please stop by lab before you go  We will call you within a week or two about your referral for CT of the chest. If you do not hear within 3 weeks, give [place] a call.   May need to get Dr. Marlou Porch opinion on a diuretic though based on your labs and exams I do not think fluid retention is your issues- that's why we are investigating other causes  New or worsening symptoms - see [place] immediately

## 2016-12-14 NOTE — Progress Notes (Signed)
Pre visit review using our clinic review tool, if applicable. No additional management support is needed unless otherwise documented below in the visit note. 

## 2016-12-14 NOTE — Progress Notes (Signed)
Subjective:  [name] is a 83 y.o. year old very pleasant male patient who presents for/with See problem oriented charting ROS- denies chest pain. Continued shortness of breath. Feels that very slight improvement in shortness of breath with going up stairs on imdur. Does cause some mild headaches   Past Medical History-  Patient Active Problem List   Diagnosis Date Noted  . Mild aortic stenosis 10/31/2016    Priority: High  . Exertional shortness of breath: Concern for Anginal Equivalent (class II-III) 10/30/2016    Priority: High  . CKD (chronic kidney disease), stage III 09/22/2012    Priority: High  . Gout 12/13/2010    Priority: Medium  . BPH (benign prostatic hyperplasia) 12/05/2009    Priority: Medium  . Hyperlipidemia 05/08/2007    Priority: Medium  . Essential hypertension 05/08/2007    Priority: Medium  . Edema 12/08/2014    Priority: Low  . Healthcare maintenance 12/08/2014    Priority: Low  . Left knee DJD 09/22/2012    Priority: Low  . Status post coronary artery stent placement   . Coronary artery disease involving native coronary artery with angina pectoris ([place]) 11/21/2016  . CAD S/P percutaneous coronary angioplasty 11/21/2016  . Abnormal nuclear stress test: prior infarct withe decreased EF 11/12/2016  . Dilated aortic root ([place]) 11/08/2016  . CKD (chronic kidney disease) stage 4, GFR 15-29 ml/min ([facility]) 11/08/2016    Medications- reviewed and updated Current Outpatient Prescriptions  Medication Sig Dispense Refill  . allopurinol (ZYLOPRIM) 300 MG tablet TAKE 1 TABLET DAILY 90 tablet 3  . aspirin 81 MG tablet Take 81 mg by mouth daily.    [name] atorvastatin (LIPITOR) 40 MG tablet Take 1 tablet (40 mg total) by mouth daily. (Patient taking differently: Take 40 mg by mouth at bedtime. ) 90 tablet 3  . clopidogrel (PLAVIX) 75 MG tablet Take 1 tablet (75 mg total) by mouth daily. 30 tablet 6  . diphenhydramine-acetaminophen (TYLENOL PM) 25-500 MG TABS tablet  Take 1 tablet by mouth at bedtime as needed (sleep).    . isosorbide mononitrate (IMDUR) 30 MG 24 hr tablet Take 1 tablet (30 mg total) by mouth daily. 30 tablet 6  . losartan (COZAAR) 25 MG tablet Take 25 mg by mouth daily.    . Tamsulosin HCl (FLOMAX) 0.4 MG CAPS Take 0.4 mg by mouth at bedtime.     . nitroGLYCERIN (NITROSTAT) 0.4 MG SL tablet Place 1 tablet (0.4 mg total) under the tongue every 5 (five) minutes as needed for chest pain. (Patient not taking: Reported on 12/14/2016) 25 tablet 11   No current facility-administered medications for this visit.     Objective: BP (!) 106/56 (BP Location: Left Arm, Patient Position: Sitting, Cuff Size: Large)   Pulse (!) 59   Temp 98.3 F (36.8 C) (Oral)   Ht 6\' 3"  (1.905 m)   Wt 255 lb 3.2 oz (115.8 kg)   SpO2 96%   BMI 31.90 kg/m  Gen: NAD, resting comfortably Oropharynx normal, nares normal CV: RRR no murmurs rubs or gallops Lungs: CTAB no crackles, wheeze, rhonchi Ext: trace edema Skin: warm, dry, no rash  Assessment/Plan:  Exertional shortness of breath: Concern for Anginal Equivalent (class II-III) S: Patient  Was discharged on 11/22/16 after complex diamondback orbital rotation arthrectomy, PCI and drug eluting stenting of the RCA and LAD. This was done by Dr. Ellyn Hack. He is no won aspirin and plavix.   Despite this stenting he has not noted significant improvement  in his shortness of breath. He gets very winded with going upstairs which is new to him in last few months. This has improved slightly on imdur. He denies wheezing, cough, congestion, leg swelling or calf pain.  A/P: with continued shortness of breath, we have opted to proceed with the CT of the chest. I doubt PE as cause without obvious DVT symptoms but will rule this out. Will have to be renally dosed with CKD stage III- there obviously is some risk here but patient is very concerned about the shortness of breath and would liek more information. Hopeful we can get CT next  week. Saw cardiology recently and they agreed with CT workup.   I also wonder about using lasix with very slight edema and diastolic dysfunction but prior BNP was under 100. I dont think mild aortic stenosis would be the cause.   His PSA is up as well Lab Results  Component Value Date   PSA 8.16 (H) 12/14/2016   PSA 6.90 (H) 10/18/2016   PSA 4.98 (H) 12/23/2013  I wonder about potential prostate cancer with mets to the lung but that will be evaluated with CT. Have advised him to follow up with [facility]  Need to make follow up plan after CT  Orders Placed This Encounter  Procedures  . CT Angio Chest W/Cm &/Or Wo Cm    LTD Dose   SS. Debra [phone]/ MCR / Not Diabetic CKD/ BUN 38 CRE 1.86 GFR 33    Standing Status:   Future    Standing Expiration Date:   03/16/2018    Scheduling Instructions:     Within 1 week please (by 12/21/16)    Order Specific Question:   If indicated for the ordered procedure, I authorize the administration of contrast media per Radiology protocol    Answer:   Yes    Order Specific Question:   Reason for Exam (SYMPTOM  OR DIAGNOSIS REQUIRED)    Answer:   shortness of breath for 1 month. did not improve after stent placement. rule out pulmonary embolism.    Order Specific Question:   Preferred imaging location?    Answer:   [place]    Order Specific Question:   Radiology Contrast Protocol - do NOT remove file path    Answer:   [email or web address]  . Basic metabolic panel    [place]  . PSA   The duration of face-to-face time during this visit was greater than 25 minutes. Greater than 50% of this time was spent in counseling, explanation of diagnosis, planning of further management, and/or coordination of care including concerns about shortness of breath and potential etiology outside of CAD.    Return precautions advised.  Garret Reddish, MD

## 2016-12-15 NOTE — Assessment & Plan Note (Signed)
S: Patient  Was discharged on 11/22/16 after complex diamondback orbital rotation arthrectomy, PCI and drug eluting stenting of the RCA and LAD. This was done by Dr. Ellyn Hack. He is no won aspirin and plavix.   Despite this stenting he has not noted significant improvement in his shortness of breath. He gets very winded with going upstairs which is new to him in last few months. This has improved slightly on imdur. He denies wheezing, cough, congestion, leg swelling or calf pain.  A/P: with continued shortness of breath, we have opted to proceed with the CT of the chest. I doubt PE as cause without obvious DVT symptoms but will rule this out. Will have to be renally dosed with CKD stage III- there obviously is some risk here but patient is very concerned about the shortness of breath and would liek more information. Hopeful we can get CT next week. Saw cardiology recently and they agreed with CT workup.   I also wonder about using lasix with very slight edema and diastolic dysfunction but prior BNP was under 100. I dont think mild aortic stenosis would be the cause.   His PSA is up as well Lab Results  Component Value Date   PSA 8.16 (H) 12/14/2016   PSA 6.90 (H) 10/18/2016   PSA 4.98 (H) 12/23/2013  I wonder about potential prostate cancer with mets to the lung but that will be evaluated with CT. Have advised him to follow up with [facility]

## 2016-12-17 ENCOUNTER — Telehealth: Payer: Self-pay | Admitting: Family Medicine

## 2016-12-17 ENCOUNTER — Ambulatory Visit (INDEPENDENT_AMBULATORY_CARE_PROVIDER_SITE_OTHER)
Admission: RE | Admit: 2016-12-17 | Discharge: 2016-12-17 | Disposition: A | Discharge status: Home / Self Care | Payer: Medicare Other | Source: Ambulatory Visit | Attending: Family Medicine | Admitting: Family Medicine

## 2016-12-17 ENCOUNTER — Encounter: Payer: Self-pay | Admitting: Family Medicine

## 2016-12-17 ENCOUNTER — Ambulatory Visit (INDEPENDENT_AMBULATORY_CARE_PROVIDER_SITE_OTHER): Payer: Medicare Other | Admitting: Family Medicine

## 2016-12-17 ENCOUNTER — Other Ambulatory Visit: Payer: Self-pay

## 2016-12-17 VITALS — BP 110/58 | HR 54 | Temp 98.1°F | Ht 75.0 in | Wt 254.6 lb

## 2016-12-17 DIAGNOSIS — R972 Elevated prostate specific antigen [PSA]: Secondary | ICD-10-CM

## 2016-12-17 DIAGNOSIS — I2699 Other pulmonary embolism without acute cor pulmonale: Secondary | ICD-10-CM | POA: Diagnosis not present

## 2016-12-17 DIAGNOSIS — N183 Chronic kidney disease, stage 3 unspecified: Secondary | ICD-10-CM

## 2016-12-17 DIAGNOSIS — I2782 Chronic pulmonary embolism: Secondary | ICD-10-CM | POA: Insufficient documentation

## 2016-12-17 DIAGNOSIS — R0602 Shortness of breath: Secondary | ICD-10-CM | POA: Diagnosis not present

## 2016-12-17 DIAGNOSIS — I25119 Atherosclerotic heart disease of native coronary artery with unspecified angina pectoris: Secondary | ICD-10-CM | POA: Diagnosis not present

## 2016-12-17 LAB — BASIC METABOLIC PANEL
BUN: 32 mg/dL — AB (ref 6–23)
CHLORIDE: 104 meq/L (ref 96–112)
CO2: 27 mEq/L (ref 19–32)
Calcium: 9.4 mg/dL (ref 8.4–10.5)
Creatinine, Ser: 1.57 mg/dL — ABNORMAL HIGH (ref 0.40–1.50)
GFR: 45.29 mL/min — ABNORMAL LOW (ref >60.00)
GLUCOSE: 95 mg/dL (ref 70–99)
POTASSIUM: 4.6 meq/L (ref 3.5–5.1)
Sodium: 137 mEq/L (ref 135–145)

## 2016-12-17 MED ORDER — APIXABAN 5 MG PO TABS: 5.0000 mg | ORAL_TABLET | Freq: Two times a day (BID) | ORAL | 6 refills | Status: DC

## 2016-12-17 MED ORDER — IOPAMIDOL (ISOVUE-370) INJECTION 76%
80.0000 mL | Freq: Once | INTRAVENOUS | Status: AC | PRN
  Administered 2016-12-17: 80 mL via INTRAVENOUS

## 2016-12-17 NOTE — Assessment & Plan Note (Signed)
S: Patient recently had catheterization for abnormal stress test which was done due to shortness of breath. 2 stents placed but no improvement in shortness of breath. Placed on imdur only mild improvement. CTA shows small clot burden and in setting of continued shortness of breath have opted to treat. He once again states no improvement in shortness of breath.   He does state before shortness of breat hstarted had several trips that were in 3.5-5 hour range from either the beach or to [place] so thought this is likely provoked. Denies history of DVT/PE  From CTA impression "Segmental/subsegmental pulmonary emboli within branches of the left lower lobe pulmonary artery. Overall clot burden is small." A/P:  New Pulmonary embolism (thought provoked with travel prior to this as noted above) -We will start eliquis 10mg  BID for 7 days then change to 5mg  BID for 7 days. Opted for eliquis over xarelto given more flexibility with GFR and his has been in 30-45 range recently with CKD III - get venous duplex of legs- potential source? - I had a discussion with Dr. Ellyn Hack today who is ok with discontinuing aspirin. Continue plavix with recent stent placement

## 2016-12-17 NOTE — Progress Notes (Signed)
Subjective:  [name] is a 83 y.o. year old very pleasant male patient who presents for/with See problem oriented charting ROS- still with continued shortness of breath. No chest pain. No edema. No abnormal diaphoresis.    Past Medical History-  Patient Active Problem List   Diagnosis Date Noted  . Pulmonary embolus ([place]) 12/17/2016    Priority: High  . Status post coronary artery stent placement     Priority: High  . Coronary artery disease involving native coronary artery with angina pectoris ([place]) 11/21/2016    Priority: High  . Mild aortic stenosis 10/31/2016    Priority: High  . Exertional shortness of breath: Concern for Anginal Equivalent (class II-III) 10/30/2016    Priority: High  . CKD (chronic kidney disease), stage III 09/22/2012    Priority: High  . Gout 12/13/2010    Priority: Medium  . BPH (benign prostatic hyperplasia) 12/05/2009    Priority: Medium  . Hyperlipidemia 05/08/2007    Priority: Medium  . Essential hypertension 05/08/2007    Priority: Medium  . Edema 12/08/2014    Priority: Low  . Healthcare maintenance 12/08/2014    Priority: Low  . Left knee DJD 09/22/2012    Priority: Low  . Dilated aortic root ([place]) 11/08/2016    Medications- reviewed and updated Current Outpatient Prescriptions  Medication Sig Dispense Refill  . allopurinol (ZYLOPRIM) 300 MG tablet TAKE 1 TABLET DAILY 90 tablet 3  . atorvastatin (LIPITOR) 40 MG tablet Take 1 tablet (40 mg total) by mouth daily. (Patient taking differently: Take 40 mg by mouth at bedtime. ) 90 tablet 3  . clopidogrel (PLAVIX) 75 MG tablet Take 1 tablet (75 mg total) by mouth daily. 30 tablet 6  . diphenhydramine-acetaminophen (TYLENOL PM) 25-500 MG TABS tablet Take 1 tablet by mouth at bedtime as needed (sleep).    . isosorbide mononitrate (IMDUR) 30 MG 24 hr tablet Take 1 tablet (30 mg total) by mouth daily. 30 tablet 6  . losartan (COZAAR) 25 MG tablet Take 25 mg by mouth daily.    . nitroGLYCERIN  (NITROSTAT) 0.4 MG SL tablet Place 1 tablet (0.4 mg total) under the tongue every 5 (five) minutes as needed for chest pain. 25 tablet 11  . Tamsulosin HCl (FLOMAX) 0.4 MG CAPS Take 0.4 mg by mouth at bedtime.     [name] apixaban (ELIQUIS) 5 MG TABS tablet Take 1-2 tablets (5-10 mg total) by mouth 2 (two) times daily. For first 7 days, take 2 tablets twice a day, then take 1 tablet twice a day 60 tablet 6   No current facility-administered medications for this visit.     Objective: BP (!) 110/58 (BP Location: Right Arm, Patient Position: Sitting, Cuff Size: Large)   Pulse (!) 54   Temp 98.1 F (36.7 C) (Oral)   Ht 6\' 3"  (1.905 m)   Wt 254 lb 9.6 oz (115.5 kg)   SpO2 97%   BMI 31.82 kg/m  Gen: NAD, resting comfortably CV: RRR no murmurs rubs or gallops Lungs: CTAB no crackles, wheeze, rhonchi Ext: no edema Skin: warm, dry  Ct Angio Chest W/cm &/or Wo Cm  Addendum Date: 12/17/2016   ADDENDUM REPORT: 12/17/2016 12:16 ADDENDUM: Critical Value/emergent results were called by telephone at the time of interpretation on 12/17/2016 at 11:54 pm to Dr. Garret Reddish , who verbally acknowledged these results. Electronically Signed   By: Julian Hy M.D.   On: 12/17/2016 12:16   Result Date: 12/17/2016 CLINICAL DATA:  Shortness of  breath x1 month EXAM: CT ANGIOGRAPHY CHEST WITH CONTRAST TECHNIQUE: Multidetector CT imaging of the chest was performed using the standard protocol during bolus administration of intravenous contrast. Multiplanar CT image reconstructions and MIPs were obtained to evaluate the vascular anatomy. CONTRAST:  72 mL Isovue 370 IV COMPARISON:  Chest radiographs 10/18/2016 FINDINGS: Cardiovascular: Satisfactory opacification of the bilateral pulmonary arteries to the segmental level. Segmental/ subsegmental pulmonary emboli within branches of the left lower lobe pulmonary artery (series 5/ image 131). Overall clot burden is small. Normal RV to LV ratio.  No evidence of right heart  strain. Mild cardiomegaly.  No pericardial effusion. Three vessel coronary atherosclerosis.  Coronary stents. Mild atherosclerotic calcifications aortic arch. Mediastinum/Nodes: No suspicious mediastinal lymphadenopathy. Visualized thyroid is unremarkable. Lungs/Pleura: No suspicious pulmonary nodules. No focal consolidation. Eventration of the hemidiaphragm with associated right basilar atelectasis. No pleural effusion or pneumothorax. Upper Abdomen: Visualized upper abdomen is unremarkable. Musculoskeletal: Degenerative changes of the visualized thoracolumbar spine. Review of the MIP images confirms the above findings. IMPRESSION: Segmental/subsegmental pulmonary emboli within branches of the left lower lobe pulmonary artery. Overall clot burden is small. Electronically Signed: By: Julian Hy M.D. On: 12/17/2016 11:48   Assessment/Plan:  Pulmonary embolus ([place]) S: Patient recently had catheterization for abnormal stress test which was done due to shortness of breath. 2 stents placed but no improvement in shortness of breath. Placed on imdur only mild improvement. CTA shows small clot burden and in setting of continued shortness of breath have opted to treat. He once again states no improvement in shortness of breath.   He does state before shortness of breat hstarted had several trips that were in 3.5-5 hour range from either the beach or to [place] so thought this is likely provoked. Denies history of DVT/PE  From CTA impression "Segmental/subsegmental pulmonary emboli within branches of the left lower lobe pulmonary artery. Overall clot burden is small." A/P:  New Pulmonary embolism (thought provoked with travel prior to this as noted above) -We will start eliquis 10mg  BID for 7 days then change to 5mg  BID for 7 days. Opted for eliquis over xarelto given more flexibility with GFR and his has been in 30-45 range recently with CKD III - get venous duplex of legs- potential source? - I had a  discussion with Dr. Ellyn Hack today who is ok with discontinuing aspirin. Continue plavix with recent stent placement  Advised 2-3 week follow up. Sooner if needed.   Orders Placed This Encounter  Procedures  . Basic metabolic panel    Ray   Meds ordered this encounter  Medications  . apixaban (ELIQUIS) 5 MG TABS tablet    Sig: Take 1-2 tablets (5-10 mg total) by mouth 2 (two) times daily. For first 7 days, take 2 tablets twice a day, then take 1 tablet twice a day    Dispense:  60 tablet    Refill:  6   Return precautions advised.  Garret Reddish, MD

## 2016-12-17 NOTE — Patient Instructions (Signed)
Stop aspirin- Dr. Ellyn Hack was ok with this  Start eliquis- Dr. Ellyn Hack was ok with this.  For first week will be 2 tablets twice a day After that will be 1 tablet twice a day for 6 months  Continue plavix  Please stop by lab before you go  I would like to check in about 2 weeks from now to see how you are doing. Seek care immediately if new or worsening symptoms  We will call you within a week or two about your referral for ultrasound of legs. If you do not hear within 3 weeks, give [place] a call.

## 2016-12-17 NOTE — Progress Notes (Signed)
Pre visit review using our clinic review tool, if applicable. No additional management support is needed unless otherwise documented below in the visit note. 

## 2016-12-17 NOTE — Telephone Encounter (Signed)
Pt would like you to fax the results of his PSA labs done today to [facility], Dr Glade Lloyd

## 2016-12-19 ENCOUNTER — Encounter: Payer: Self-pay | Admitting: Cardiology

## 2016-12-19 ENCOUNTER — Telehealth: Payer: Self-pay | Admitting: Cardiology

## 2016-12-19 ENCOUNTER — Telehealth: Payer: Self-pay | Admitting: Family Medicine

## 2016-12-19 NOTE — Telephone Encounter (Signed)
Close encounter 

## 2016-12-19 NOTE — Telephone Encounter (Signed)
We will help get this taken care of. Thank you. [name],   ----- Message -----  From: [name]  Sent: 12/19/2016 10:54 AM EDT  To: [name], MD Subject: Visit Follow-Up Question  [name] 2034-01-21 phone [phone]. I am scheduled for cardiac rehab beginning Dec 25, 2016 with normal follow up sessions through August. I am moving to [place] on May 25 and have contacted [facility] in [place]. They offer the same rehab program and will accept a referral fro [facility]. I am requesting that you provide them with all of my pertinent records and make the referral. The contact at [facility] is [phone]. Please contact me for further details or with any questions. Thank you for your assistance.

## 2016-12-19 NOTE — Telephone Encounter (Signed)
Pt needs refill generic plavix 75 mg #90 w/refills [facility]

## 2016-12-20 ENCOUNTER — Other Ambulatory Visit: Payer: Self-pay

## 2016-12-20 ENCOUNTER — Telehealth: Payer: Self-pay

## 2016-12-20 ENCOUNTER — Ambulatory Visit (HOSPITAL_COMMUNITY)
Admission: RE | Admit: 2016-12-20 | Discharge: 2016-12-20 | Disposition: A | Discharge status: Home / Self Care | Payer: Medicare Other | Source: Ambulatory Visit | Attending: Cardiovascular Disease | Admitting: Cardiovascular Disease

## 2016-12-20 DIAGNOSIS — I82511 Chronic embolism and thrombosis of right femoral vein: Secondary | ICD-10-CM | POA: Diagnosis not present

## 2016-12-20 DIAGNOSIS — I82531 Chronic embolism and thrombosis of right popliteal vein: Secondary | ICD-10-CM | POA: Diagnosis not present

## 2016-12-20 DIAGNOSIS — I2699 Other pulmonary embolism without acute cor pulmonale: Secondary | ICD-10-CM

## 2016-12-20 MED ORDER — APIXABAN 5 MG PO TABS: 5.0000 mg | ORAL_TABLET | Freq: Two times a day (BID) | ORAL | 6 refills | Status: DC

## 2016-12-20 MED ORDER — CLOPIDOGREL BISULFATE 75 MG PO TABS: 75.0000 mg | ORAL_TABLET | Freq: Every day | ORAL | 2 refills | Status: DC

## 2016-12-20 NOTE — Telephone Encounter (Signed)
Received a phone call from [place] at [facility]. Patient was in for a Lower Extremity Venous Doppler and it is positive for a DVT in both legs. Dr. Yong Channel immediately made aware. Pt instructed to continue taking Eliquis that he is on.

## 2016-12-20 NOTE — Telephone Encounter (Signed)
Results faxed as requested

## 2016-12-20 NOTE — Telephone Encounter (Signed)
Prescription refill request sent to pharmacy

## 2016-12-24 ENCOUNTER — Telehealth (HOSPITAL_COMMUNITY): Payer: Self-pay | Admitting: Cardiac Rehabilitation

## 2016-12-24 NOTE — Telephone Encounter (Signed)
-----   Message from Jerline Pain, MD sent at 12/24/2016  4:31 PM EDT ----- Regarding: RE: cardiac rehab  Let's wait until he sees me the next day. Thank you. [name], MD  ----- Message ----- From: Lowell Guitar, RN Sent: 12/24/2016   4:01 PM To: Jerline Pain, MD Subject: cardiac rehab                                  Dear Dr. Marlou Porch,  Pt scheduled to begin cardiac rehab orientation 12/25/2016,which includes his entry 6 minute walk test assessment.  Pt recently diagnosed with PE on CTA "small clot burden" and bilateral DVT.  He is taking eliquis.  Pt reports he has continued dyspnea on exertion with stair climbing, otherwise asymptomatic.  He has scheduled OV with you 12/26/16.  Would it be appropriate to do his walk test as scheduled or wait until after he has been evaluated by you?  Thank you, Andi Hence, RN, BSN Cardiac Pulmonary Rehab

## 2016-12-24 NOTE — Telephone Encounter (Signed)
Upon chart review, pt is scheduled to move to [facility] next week 01/04/2017 with plans to transfer care to [facility]. Pt also recently diagnosed with PE and DVT. Pt c/o continued dyspnea on exertion with stair climbing, unchanged since starting Imdur and eliquis.  Pt has scheduled OV 12/26/2016 with Dr. Marlou Porch.  Pt advised more appropriate to wait to begin entire cardiac rehab program at [facility] with recent medical events and short duration of availability to attend [facility] Cardiac rehab (orientation +2 exercise sessions). Pt appt cancelled. Pt verbalized understanding.

## 2016-12-25 ENCOUNTER — Encounter: Payer: Self-pay | Admitting: Family Medicine

## 2016-12-25 ENCOUNTER — Telehealth (HOSPITAL_COMMUNITY): Payer: Self-pay

## 2016-12-25 ENCOUNTER — Inpatient Hospital Stay (HOSPITAL_COMMUNITY): Admission: RE | Admit: 2016-12-25 | Payer: Medicare Other | Source: Ambulatory Visit

## 2016-12-25 NOTE — Telephone Encounter (Signed)
Referral closed pt is moving to [place] and setting up at [facility]... KJ

## 2016-12-26 ENCOUNTER — Encounter: Payer: Self-pay | Admitting: Cardiology

## 2016-12-26 ENCOUNTER — Ambulatory Visit (INDEPENDENT_AMBULATORY_CARE_PROVIDER_SITE_OTHER): Payer: Medicare Other | Admitting: Cardiology

## 2016-12-26 VITALS — BP 120/70 | HR 64 | Ht 75.0 in | Wt 254.2 lb

## 2016-12-26 DIAGNOSIS — I350 Nonrheumatic aortic (valve) stenosis: Secondary | ICD-10-CM

## 2016-12-26 DIAGNOSIS — I2699 Other pulmonary embolism without acute cor pulmonale: Secondary | ICD-10-CM

## 2016-12-26 DIAGNOSIS — R0602 Shortness of breath: Secondary | ICD-10-CM | POA: Diagnosis not present

## 2016-12-26 DIAGNOSIS — I25119 Atherosclerotic heart disease of native coronary artery with unspecified angina pectoris: Secondary | ICD-10-CM

## 2016-12-26 DIAGNOSIS — I209 Angina pectoris, unspecified: Secondary | ICD-10-CM | POA: Diagnosis not present

## 2016-12-26 DIAGNOSIS — I77810 Thoracic aortic ectasia: Secondary | ICD-10-CM | POA: Diagnosis not present

## 2016-12-26 NOTE — Patient Instructions (Addendum)
Medication Instructions:  STOP Imdur (Isosorbide)   Labwork: None Ordered   Testing/Procedures: None Ordered   Follow-Up: Your physician recommends that you schedule a follow-up appointment in: 4 months with Dr. Marlou Porch.    If you need a refill on your cardiac medications before your next appointment, please call your pharmacy.   Thank you for choosing [facility]! Christen Bame, RN [phone]

## 2016-12-26 NOTE — Progress Notes (Signed)
Cardiology Office Note    Date:  12/26/2016   ID:  [name], DOB Dec 27, 1933, MRN [id number]  PCP:  Richard Olp, MD  Cardiologist:   Richard Furbish, MD     History of Present Illness:  [name] is a 83 y.o. male here forFollow-up of prior myocardial infarction with shortness of breath. He's been feeling fatigue and shortness of breath, losartan was stopped without any improvement. No specific chest pain.  This shortness of breath seems to have happened quite suddenly to him. It is as if one day he felt his normal level of activity in the next day he felt shortness of breath (likely PE-see below). He does not remember having any incident chest pain. No significant long travels. He has chronic left lower extremity edema, prior knee replacement and prior leg injury. It is fairly significant when he goes up the stairs for instance, relieved with rest.  He has never smoked cigarettes, no alcohol, no coffee. RetiredColonel, Statistician for 30 years.  He has chronic back and knee pain, Dr. Alfonso [name] has seen him.   His wife is [name].  12/26/16-after our initial encounter, he underwent cardiac catheterization which revealed heavily calcified CAD and had successful rotational atherectomy as below of RCA and LAD. Continue to have some shortness of breath and a CT scan was performed of his chest which revealed subsegmental pulmonary emboli in his left lower lobe, small clot burden. DVTs were noted as below bilaterally with cannulization of clot. He was placed on Eliquis. Aspirin was discontinued. Continued on Plavix given his recent stent placement. Overall he is feeling fairly well. No significant chest pain. No bleeding, no syncope.   Past Medical History:  Diagnosis Date  . Arthritis    "back, knees" (11/21/2016)  . Bladder stones   . CAD (coronary artery disease)    11/20/16 PCI with DES--> LAD/RCA  . Chronic lower back pain   . CKD (chronic kidney disease), stage IV ([place])      [name] 11/08/2016  . History of gout   . History of kidney stones 12/02/2008  . Hyperlipidemia   . Hypertension   . Myocardial infarction [facility])    "silent" (11/21/2016)  . PSA, INCREASED 12/05/2009  . UTI (urinary tract infection)     Past Surgical History:  Procedure Laterality Date  . CARDIAC CATHETERIZATION  11/14/2016  . CATARACT EXTRACTION W/ INTRAOCULAR LENS  IMPLANT, BILATERAL    . CORONARY ANGIOPLASTY WITH STENT PLACEMENT  11/21/2016   "2 stents"   . CORONARY ATHERECTOMY N/A 11/21/2016   Procedure: Coronary Atherectomy;  Surgeon: Leonie Man, MD;  Location: [place] [facility];  Service: Cardiovascular;  Laterality: N/A;  RCA and LAD  . CORONARY STENT INTERVENTION N/A 11/21/2016   Procedure: Coronary Stent Intervention;  Surgeon: Leonie Man, MD;  Location: [place] [facility];  Service: Cardiovascular;  Laterality: N/A;  RCA and LAD  . CYSTOSCOPY W/ STONE MANIPULATION    . JOINT REPLACEMENT    . KNEE ARTHROSCOPY Left   . LAPAROSCOPIC CHOLECYSTECTOMY    . LEFT HEART CATH N/A 11/21/2016   Procedure: Left Heart Cath;  Surgeon: Leonie Man, MD;  Location: [place] [facility];  Service: Cardiovascular;  Laterality: N/A;  . LITHOTRIPSY    . PILONIDAL CYST EXCISION  58  . RIGHT/LEFT HEART CATH AND CORONARY ANGIOGRAPHY N/A 11/14/2016   Procedure: Right/Left Heart Cath and Coronary Angiography;  Surgeon: Leonie Man, MD;  Location: [place] [facility];  Service: Cardiovascular;  Laterality: N/A;  . TEMPORARY PACEMAKER N/A 11/21/2016   Procedure: Temporary Pacemaker;  Surgeon: Leonie Man, MD;  Location: [place] [facility];  Service: Cardiovascular;  Laterality: N/A;  . TONSILLECTOMY    . TOTAL KNEE ARTHROPLASTY Left 09/22/2012   Procedure: TOTAL KNEE ARTHROPLASTY;  Surgeon: Lorn Junes, MD;  Location: [place];  Service: Orthopedics;  Laterality: Left;    Current Medications: Outpatient Medications Prior to Visit  Medication Sig Dispense Refill  . allopurinol  (ZYLOPRIM) 300 MG tablet TAKE 1 TABLET DAILY 90 tablet 3  . apixaban (ELIQUIS) 5 MG TABS tablet Take 1-2 tablets (5-10 mg total) by mouth 2 (two) times daily. For first 7 days, take 2 tablets twice a day, then take 1 tablet twice a day 60 tablet 6  . atorvastatin (LIPITOR) 40 MG tablet Take 1 tablet (40 mg total) by mouth daily. (Patient taking differently: Take 40 mg by mouth at bedtime. ) 90 tablet 3  . clopidogrel (PLAVIX) 75 MG tablet Take 1 tablet (75 mg total) by mouth daily. 90 tablet 2  . diphenhydramine-acetaminophen (TYLENOL PM) 25-500 MG TABS tablet Take 1 tablet by mouth at bedtime as needed (sleep).    . losartan (COZAAR) 25 MG tablet Take 25 mg by mouth daily.    . nitroGLYCERIN (NITROSTAT) 0.4 MG SL tablet Place 1 tablet (0.4 mg total) under the tongue every 5 (five) minutes as needed for chest pain. 25 tablet 11  . Tamsulosin HCl (FLOMAX) 0.4 MG CAPS Take 0.4 mg by mouth at bedtime.     . isosorbide mononitrate (IMDUR) 30 MG 24 hr tablet Take 1 tablet (30 mg total) by mouth daily. 30 tablet 6   No facility-administered medications prior to visit.      Allergies:   Epinephrine; Lisinopril; and Penicillins   Social History   Social History  . Marital status: Married    Spouse name: N/A  . Number of children: N/A  . Years of education: N/A   Social History Main Topics  . Smoking status: Never Smoker  . Smokeless tobacco: Never Used  . Alcohol use Yes     Comment: 11/21/2016 "nothing since 2015"  . Drug use: No  . Sexual activity: Yes    Partners: Female   Other Topics Concern  . None   Social History Narrative   Married (Wife [name] patient of Dr. Yong Channel). 2 sons. 4 grandkids.    Originally from near [place], [place] [employer] 6[place]7 months of the year      Retired from [employer] long time. 27 years of service.       Hobbies: golf   Used to [employer], run marathons     Family History:  The patient's family history includes Breast cancer in  his mother; Heart attack in his father.   ROS:   Please see the history of present illness.    Review of Systems  All other systems reviewed and are negative.    PHYSICAL EXAM:   VS:  BP 120/70 (BP Location: Left Arm, Patient Position: Sitting, Cuff Size: Normal)   Pulse 64   Ht _0  (1.905 m)   Wt 254 lb 3.2 oz (115.3 kg)   SpO2 96%   BMI 31.77 kg/m    GEN: Well-nourished  HEENT: normal  Neck: no JVD, carotid bruits, or masses Cardiac: RRR; no murmurs, rubs, or gallops, Left leg edema chronic Respiratory:  clear to auscultation bilaterally,  normal work of breathing GI: soft, nontender, nondistended, + BS, overweight  MS: no deformity or atrophy  Skin: warm and dry, no rash Neuro:  Alert and Oriented x 3, Strength and sensation are intact Psych: euthymic mood, full affect, stable  Wt Readings from Last 3 Encounters:  12/26/16 254 lb 3.2 oz (115.3 kg)  12/17/16 254 lb 9.6 oz (115.5 kg)  12/14/16 255 lb 3.2 oz (115.8 kg)      Studies/Labs Reviewed:   EKG: 10/18/16-heart rate 53 bpm sinus bradycardia with incomplete right bundle branch block, nonspecific ST-T wave flattening. Personally viewed.  Recent Labs: 10/18/2016: ALT 19; Pro B Natriuretic peptide (BNP) 46.0; TSH 1.77 11/22/2016: Hemoglobin 15.0; Platelets 135 12/17/2016: BUN 32; Creatinine, Ser 1.57; Potassium 4.6; Sodium 137   Lipid Panel    Component Value Date/Time   CHOL 145 01/19/2016 0902   TRIG 96.0 01/19/2016 0902   HDL 40.10 01/19/2016 0902   CHOLHDL 4 01/19/2016 0902   VLDL 19.2 01/19/2016 0902   LDLCALC 86 01/19/2016 0902   LDLDIRECT 79.0 12/08/2014 0905    Additional studies/ records that were reviewed today include:   ECHO 10/31/16 - Left ventricle: The cavity size was normal. Wall thickness was   increased in a pattern of moderate LVH. Systolic function was   normal. The estimated ejection fraction was in the range of 60%   to 65%. Wall motion was normal; there were no regional wall   motion  abnormalities. Doppler parameters are consistent with   abnormal left ventricular relaxation (grade 1 diastolic   dysfunction). The E/e&' ratio is between 8-15, suggesting   indeterminate LV filling pressure. - Aortic valve: Mild aortic stenosis. There was trivial   regurgitation. Mean gradient (S): 13 mm Hg. Peak gradient (S): 24   mm Hg. Valve area (VTI): 1.98 cm^2. Valve area (Vmax): 1.77 cm^2. - Aorta: Aortic root dimension: 40 mm (ED). - Aortic root: The aortic root is mildly dilated. - Mitral valve: Calcified annulus. Mildly thickened leaflets .   There was trivial regurgitation. - Left atrium: The atrium was normal in size. - Tricuspid valve: There was mild regurgitation. - Pulmonary arteries: PA peak pressure: 40 mm Hg (S) + RAP. - Systemic veins: The IVC was not visualized.  Impressions:  - LVEF 60-65%, moderate LVH, normal wall motion, diastolic   dysfunction with indeterminate LV filling pressure, mild aortic   stenosis - AVA 1.8-1.9 cm2, mildly dilated aortic root to 4.0 cm,   MAC with trivial MR, normal biatrial size, mild TR, RVSP 40 mmHg   + RAP, IVC not visualized, no pericardial effusion.  NUC stress 11/01/16  Nuclear stress EF: 44%.  There was no ST segment deviation noted during stress.  The left ventricular ejection fraction is moderately decreased (30-44%).  This is an intermediate risk study.  Findings consistent with prior myocardial infarction.   Small mid and basal inferior wall infarct Small apical infarct No ischemia EF 44% diffuse hypokinesis  Cath 11/14/16:  Moderate to Heavily Calcified Coronary Arteries  Prox LAD lesion, 70 %stenosed. Moderately calcified - bifurcation lesion (1,1,1)  Ost 1st Diag to 1st Diag lesion, 60 %stenosed.  Prox RCA lesion, 80 %stenosed. Heavily Calcified.  The left ventricular systolic function is normal. The left ventricular ejection fraction is 55-65% by visual estimate.  LV end diastolic pressure is  normal.   The patient has severe 2 vessel disease involving the LAD, and RCA. Based on the amount calcification, think he is best served with staged atherectomy-based PCI  on these vessels.  He will be discharged today after a Plavix load. We'll schedule him for next week as staged PCI.  Cath 11/21/16:  LESION #1  Prox RCA lesion, 80 %stenosed.  Ost 1st Diag to 1st Diag lesion, 50 %stenosed. Stable.  Post intervention, there is a 0% residual stenosis.  After ORBITAL ATHERECTOMY, A STENT RESOLUTE ONYX 3.5X22 drug eluting stent was successfully placed.  Lesion #2  Prox LAD lesion, 70 %stenosed - calcified, crossed 1st Diag.  After ORBITAL ATHERECTOMY, A STENT RESOLUTE [id number] drug eluting stent was successfully placed.  Post intervention, there is a 0% residual stenosis.  __________________  LV end diastolic pressure is normal.   Successful 2 vessel oral atherectomy based DES PCI on RCA and LAD.   PLAN:  Overnight observation for IV hydration - continue 125 ML per hour for 10 hours, then reduce to 75 ML per hour overnight.  Continue dual antiplatelet therapy for minimum one year (could stop aspirin after 2-3 months if necessary)  Closely monitor tomorrow or determine stability for discharge, if renal function is stable, he should be fine for discharge tomorrow with plan to check an outpatient chemical panel early next week.  CT chest   - IMPRESSION: Segmental/subsegmental pulmonary emboli within branches of the left lower lobe pulmonary artery. Overall clot burden is small.  LE DVT:  Duplex imaging of the lower extremities reveals chronic, non occlusive, thrombosis in the right distal femoral vein, one of the paired mid and distal peroneal veins and one of the paired proximal posterior tibial veins. Chronic, non-occlusive thrombus in the left popliteal vein. Bilateral superficial veins are easily compressible, without intraluminal thrombus. Incompetent right  distal femoral vein and popliteal vein; no incompetence in the left lower extremity.  ASSESSMENT:    1. Coronary artery disease involving native coronary artery of native heart with angina pectoris ([place])   2. Mild aortic stenosis   3. Dilated aortic root ([facility])   4. Other acute pulmonary embolism without acute cor pulmonale ([place])   5. SOB (shortness of breath)      PLAN:  In order of problems listed above:  Dyspnea on exertion/CAD/PE/DVT  - He underwent extensive cardiac workup which revealed coronary artery disease and underwent rotational atherectomy percutaneous intervention to RCA and LAD.  - He still continued to have shortness of breath and workup continued with CT scan of chest which revealed subsegmental pulmonary emboli within branches of the left lower lobe pulmonary artery. Overall clot burden was felt to be small. Bilateral DVTs were noted. He was placed on anticoagulation with Eliquis. He is taking Plavix, aspirin has been stopped.  - No significant change with isosorbide. He did feel a mild posterior headache. We have stopped his isosorbide.  - BNP was normal.  - Cardiomegaly noted on x-ray.  - Creatinine 1.7-1.9.   - I'm comfortable with him participating with cardiac redilatation at this point. [place]. He is going in [place] [employer] region over the summertime.    Dilated aortic root  - 4.0. We will continue to monitor. Continue to treat blood pressure. Stable  Mild aortic stenosis  - Murmur heard on exam. Continue to monitor. Should not be of clinical significance causing shortness of breath at this point. No changes  Chronic kidney disease stage 4  - Continue to monitor.  Chronic left lower extremity edema/DVT/chronic anticoagulation-prior knee replacement, prior injury in military  -Chronic DVT noted, PE left lower lobe subsegmental small clot burden noted.  - Continue with Eliquis  for at least 6 months, could consider maintenance dose  thereafter.   Medication Adjustments/Labs and Tests Ordered: Current medicines are reviewed at length with the patient today.  Concerns regarding medicines are outlined above.  Medication changes, Labs and Tests ordered today are listed in the Patient Instructions below. Patient Instructions  Medication Instructions:  STOP Imdur (Isosorbide)   Labwork: None Ordered   Testing/Procedures: None Ordered   Follow-Up: Your physician recommends that you schedule a follow-up appointment in: 4 months with Dr. Marlou Porch.    If you need a refill on your cardiac medications before your next appointment, please call your pharmacy.   Thank you for choosing [facility]! Christen Bame, RN [phone]       Signed, Richard Furbish, MD  12/26/2016 11:56 AM    [address], [place], [place]  [address] Phone: [phone]; Fax: [phone]

## 2016-12-27 DIAGNOSIS — N401 Enlarged prostate with lower urinary tract symptoms: Secondary | ICD-10-CM | POA: Diagnosis not present

## 2016-12-27 DIAGNOSIS — R3912 Poor urinary stream: Secondary | ICD-10-CM | POA: Diagnosis not present

## 2016-12-27 DIAGNOSIS — R972 Elevated prostate specific antigen [PSA]: Secondary | ICD-10-CM | POA: Diagnosis not present

## 2016-12-27 LAB — PSA: PSA: 8.2

## 2016-12-28 ENCOUNTER — Encounter: Payer: Self-pay | Admitting: Family Medicine

## 2017-01-01 ENCOUNTER — Encounter: Payer: Self-pay | Admitting: Family Medicine

## 2017-01-02 ENCOUNTER — Ambulatory Visit (HOSPITAL_COMMUNITY): Payer: Medicare Other

## 2017-01-04 ENCOUNTER — Encounter: Payer: Self-pay | Admitting: Family Medicine

## 2017-01-04 ENCOUNTER — Ambulatory Visit (HOSPITAL_COMMUNITY): Payer: Medicare Other

## 2017-01-08 DIAGNOSIS — I10 Essential (primary) hypertension: Secondary | ICD-10-CM | POA: Diagnosis not present

## 2017-01-08 DIAGNOSIS — I2510 Atherosclerotic heart disease of native coronary artery without angina pectoris: Secondary | ICD-10-CM | POA: Diagnosis not present

## 2017-01-08 DIAGNOSIS — E785 Hyperlipidemia, unspecified: Secondary | ICD-10-CM | POA: Diagnosis not present

## 2017-01-08 DIAGNOSIS — I82403 Acute embolism and thrombosis of unspecified deep veins of lower extremity, bilateral: Secondary | ICD-10-CM | POA: Diagnosis not present

## 2017-01-09 ENCOUNTER — Ambulatory Visit (HOSPITAL_COMMUNITY): Payer: Medicare Other

## 2017-01-10 ENCOUNTER — Other Ambulatory Visit: Payer: Self-pay | Admitting: *Deleted

## 2017-01-10 DIAGNOSIS — I2699 Other pulmonary embolism without acute cor pulmonale: Secondary | ICD-10-CM

## 2017-01-10 MED ORDER — APIXABAN 5 MG PO TABS: 5.0000 mg | ORAL_TABLET | Freq: Two times a day (BID) | ORAL | 0 refills | Status: DC

## 2017-01-11 ENCOUNTER — Ambulatory Visit (HOSPITAL_COMMUNITY): Payer: Medicare Other

## 2017-01-14 ENCOUNTER — Ambulatory Visit (HOSPITAL_COMMUNITY): Payer: Medicare Other

## 2017-01-16 ENCOUNTER — Ambulatory Visit (HOSPITAL_COMMUNITY): Payer: Medicare Other

## 2017-01-16 DIAGNOSIS — I2510 Atherosclerotic heart disease of native coronary artery without angina pectoris: Secondary | ICD-10-CM | POA: Diagnosis not present

## 2017-01-17 DIAGNOSIS — I2510 Atherosclerotic heart disease of native coronary artery without angina pectoris: Secondary | ICD-10-CM | POA: Diagnosis not present

## 2017-01-18 ENCOUNTER — Ambulatory Visit (HOSPITAL_COMMUNITY): Payer: Medicare Other

## 2017-01-21 ENCOUNTER — Ambulatory Visit (HOSPITAL_COMMUNITY): Payer: Medicare Other

## 2017-01-22 DIAGNOSIS — I2510 Atherosclerotic heart disease of native coronary artery without angina pectoris: Secondary | ICD-10-CM | POA: Diagnosis not present

## 2017-01-23 ENCOUNTER — Ambulatory Visit (HOSPITAL_COMMUNITY): Payer: Medicare Other

## 2017-01-24 DIAGNOSIS — I2510 Atherosclerotic heart disease of native coronary artery without angina pectoris: Secondary | ICD-10-CM | POA: Diagnosis not present

## 2017-01-25 ENCOUNTER — Ambulatory Visit (HOSPITAL_COMMUNITY): Payer: Medicare Other

## 2017-01-28 ENCOUNTER — Ambulatory Visit (HOSPITAL_COMMUNITY): Payer: Medicare Other

## 2017-01-29 DIAGNOSIS — I2510 Atherosclerotic heart disease of native coronary artery without angina pectoris: Secondary | ICD-10-CM | POA: Diagnosis not present

## 2017-01-30 ENCOUNTER — Ambulatory Visit (HOSPITAL_COMMUNITY): Payer: Medicare Other

## 2017-01-31 DIAGNOSIS — I2510 Atherosclerotic heart disease of native coronary artery without angina pectoris: Secondary | ICD-10-CM | POA: Diagnosis not present

## 2017-02-01 ENCOUNTER — Ambulatory Visit (HOSPITAL_COMMUNITY): Payer: Medicare Other

## 2017-02-04 ENCOUNTER — Ambulatory Visit (HOSPITAL_COMMUNITY): Payer: Medicare Other

## 2017-02-05 DIAGNOSIS — I2510 Atherosclerotic heart disease of native coronary artery without angina pectoris: Secondary | ICD-10-CM | POA: Diagnosis not present

## 2017-02-06 ENCOUNTER — Ambulatory Visit (HOSPITAL_COMMUNITY): Payer: Medicare Other

## 2017-02-07 DIAGNOSIS — I2510 Atherosclerotic heart disease of native coronary artery without angina pectoris: Secondary | ICD-10-CM | POA: Diagnosis not present

## 2017-02-08 ENCOUNTER — Ambulatory Visit (HOSPITAL_COMMUNITY): Payer: Medicare Other

## 2017-02-11 ENCOUNTER — Ambulatory Visit (HOSPITAL_COMMUNITY): Payer: Medicare Other

## 2017-02-12 DIAGNOSIS — I2510 Atherosclerotic heart disease of native coronary artery without angina pectoris: Secondary | ICD-10-CM | POA: Diagnosis not present

## 2017-02-14 DIAGNOSIS — I2510 Atherosclerotic heart disease of native coronary artery without angina pectoris: Secondary | ICD-10-CM | POA: Diagnosis not present

## 2017-02-15 ENCOUNTER — Ambulatory Visit (HOSPITAL_COMMUNITY): Payer: Medicare Other

## 2017-02-18 ENCOUNTER — Ambulatory Visit (HOSPITAL_COMMUNITY): Payer: Medicare Other

## 2017-02-19 DIAGNOSIS — I2510 Atherosclerotic heart disease of native coronary artery without angina pectoris: Secondary | ICD-10-CM | POA: Diagnosis not present

## 2017-02-20 ENCOUNTER — Ambulatory Visit (HOSPITAL_COMMUNITY): Payer: Medicare Other

## 2017-02-21 DIAGNOSIS — I2510 Atherosclerotic heart disease of native coronary artery without angina pectoris: Secondary | ICD-10-CM | POA: Diagnosis not present

## 2017-02-22 ENCOUNTER — Ambulatory Visit (HOSPITAL_COMMUNITY): Payer: Medicare Other

## 2017-02-25 ENCOUNTER — Ambulatory Visit (HOSPITAL_COMMUNITY): Payer: Medicare Other

## 2017-02-27 ENCOUNTER — Ambulatory Visit (HOSPITAL_COMMUNITY): Payer: Medicare Other

## 2017-02-28 DIAGNOSIS — I2510 Atherosclerotic heart disease of native coronary artery without angina pectoris: Secondary | ICD-10-CM | POA: Diagnosis not present

## 2017-03-01 ENCOUNTER — Ambulatory Visit (HOSPITAL_COMMUNITY): Payer: Medicare Other

## 2017-03-04 ENCOUNTER — Ambulatory Visit (HOSPITAL_COMMUNITY): Payer: Medicare Other

## 2017-03-05 DIAGNOSIS — I2510 Atherosclerotic heart disease of native coronary artery without angina pectoris: Secondary | ICD-10-CM | POA: Diagnosis not present

## 2017-03-06 ENCOUNTER — Ambulatory Visit (HOSPITAL_COMMUNITY): Payer: Medicare Other

## 2017-03-07 DIAGNOSIS — I2510 Atherosclerotic heart disease of native coronary artery without angina pectoris: Secondary | ICD-10-CM | POA: Diagnosis not present

## 2017-03-08 ENCOUNTER — Ambulatory Visit (HOSPITAL_COMMUNITY): Payer: Medicare Other

## 2017-03-11 ENCOUNTER — Ambulatory Visit (HOSPITAL_COMMUNITY): Payer: Medicare Other

## 2017-03-12 DIAGNOSIS — I2510 Atherosclerotic heart disease of native coronary artery without angina pectoris: Secondary | ICD-10-CM | POA: Diagnosis not present

## 2017-03-13 ENCOUNTER — Ambulatory Visit (HOSPITAL_COMMUNITY): Payer: Medicare Other

## 2017-03-14 DIAGNOSIS — I2510 Atherosclerotic heart disease of native coronary artery without angina pectoris: Secondary | ICD-10-CM | POA: Diagnosis not present

## 2017-03-15 ENCOUNTER — Ambulatory Visit (HOSPITAL_COMMUNITY): Payer: Medicare Other

## 2017-03-18 ENCOUNTER — Ambulatory Visit (HOSPITAL_COMMUNITY): Payer: Medicare Other

## 2017-03-19 DIAGNOSIS — I2510 Atherosclerotic heart disease of native coronary artery without angina pectoris: Secondary | ICD-10-CM | POA: Diagnosis not present

## 2017-03-20 ENCOUNTER — Ambulatory Visit (HOSPITAL_COMMUNITY): Payer: Medicare Other

## 2017-03-21 DIAGNOSIS — I2510 Atherosclerotic heart disease of native coronary artery without angina pectoris: Secondary | ICD-10-CM | POA: Diagnosis not present

## 2017-03-22 ENCOUNTER — Ambulatory Visit (HOSPITAL_COMMUNITY): Payer: Medicare Other

## 2017-03-25 ENCOUNTER — Ambulatory Visit (HOSPITAL_COMMUNITY): Payer: Medicare Other

## 2017-03-25 ENCOUNTER — Other Ambulatory Visit: Payer: Self-pay | Admitting: Family Medicine

## 2017-03-26 DIAGNOSIS — I2510 Atherosclerotic heart disease of native coronary artery without angina pectoris: Secondary | ICD-10-CM | POA: Diagnosis not present

## 2017-03-27 ENCOUNTER — Ambulatory Visit (HOSPITAL_COMMUNITY): Payer: Medicare Other

## 2017-03-28 DIAGNOSIS — R972 Elevated prostate specific antigen [PSA]: Secondary | ICD-10-CM | POA: Diagnosis not present

## 2017-03-28 LAB — PSA: PSA: 5.21

## 2017-03-29 ENCOUNTER — Ambulatory Visit (HOSPITAL_COMMUNITY): Payer: Medicare Other

## 2017-04-01 ENCOUNTER — Ambulatory Visit (HOSPITAL_COMMUNITY): Payer: Medicare Other

## 2017-04-03 ENCOUNTER — Ambulatory Visit (HOSPITAL_COMMUNITY): Payer: Medicare Other

## 2017-04-04 DIAGNOSIS — N401 Enlarged prostate with lower urinary tract symptoms: Secondary | ICD-10-CM | POA: Diagnosis not present

## 2017-04-04 DIAGNOSIS — R972 Elevated prostate specific antigen [PSA]: Secondary | ICD-10-CM | POA: Diagnosis not present

## 2017-04-04 DIAGNOSIS — R3912 Poor urinary stream: Secondary | ICD-10-CM | POA: Diagnosis not present

## 2017-04-05 ENCOUNTER — Ambulatory Visit (HOSPITAL_COMMUNITY): Payer: Medicare Other

## 2017-04-09 DIAGNOSIS — I2510 Atherosclerotic heart disease of native coronary artery without angina pectoris: Secondary | ICD-10-CM | POA: Diagnosis not present

## 2017-04-11 DIAGNOSIS — I2510 Atherosclerotic heart disease of native coronary artery without angina pectoris: Secondary | ICD-10-CM | POA: Diagnosis not present

## 2017-04-16 DIAGNOSIS — I2510 Atherosclerotic heart disease of native coronary artery without angina pectoris: Secondary | ICD-10-CM | POA: Diagnosis not present

## 2017-04-18 DIAGNOSIS — I2510 Atherosclerotic heart disease of native coronary artery without angina pectoris: Secondary | ICD-10-CM | POA: Diagnosis not present

## 2017-04-19 ENCOUNTER — Encounter: Payer: Self-pay | Admitting: Family Medicine

## 2017-04-30 ENCOUNTER — Other Ambulatory Visit: Payer: Self-pay

## 2017-04-30 ENCOUNTER — Telehealth: Payer: Self-pay | Admitting: Family Medicine

## 2017-04-30 MED ORDER — LOSARTAN POTASSIUM 25 MG PO TABS: 25.0000 mg | ORAL_TABLET | Freq: Every day | ORAL | 0 refills | Status: DC
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
## 2017-04-30 NOTE — Telephone Encounter (Signed)
Prescription sent to pharmacy as requested.

## 2017-05-08 ENCOUNTER — Ambulatory Visit (INDEPENDENT_AMBULATORY_CARE_PROVIDER_SITE_OTHER): Payer: Medicare Other | Admitting: Cardiology

## 2017-05-08 ENCOUNTER — Encounter: Payer: Self-pay | Admitting: Cardiology

## 2017-05-08 VITALS — BP 132/70 | HR 57 | Ht 75.0 in | Wt 244.4 lb

## 2017-05-08 DIAGNOSIS — I25119 Atherosclerotic heart disease of native coronary artery with unspecified angina pectoris: Secondary | ICD-10-CM | POA: Diagnosis not present

## 2017-05-08 DIAGNOSIS — I252 Old myocardial infarction: Secondary | ICD-10-CM | POA: Diagnosis not present

## 2017-05-08 DIAGNOSIS — I77810 Thoracic aortic ectasia: Secondary | ICD-10-CM

## 2017-05-08 DIAGNOSIS — I350 Nonrheumatic aortic (valve) stenosis: Secondary | ICD-10-CM | POA: Diagnosis not present

## 2017-05-08 DIAGNOSIS — N184 Chronic kidney disease, stage 4 (severe): Secondary | ICD-10-CM

## 2017-05-08 NOTE — Progress Notes (Signed)
Cardiology Office Note    Date:  05/08/2017   ID:  [name], DOB October 07, 1933, MRN [id number]  PCP:  Marin Olp, MD  Cardiologist:   [name], MD     History of Present Illness:  [name] is a 83 y.o. male here forFollow-up of prior myocardial infarction with shortness of breath. He's been feeling fatigue and shortness of breath, losartan was stopped without any improvement. No specific chest pain.  This shortness of breath seems to have happened quite suddenly to him. It is as if one day he felt his normal level of activity in the next day he felt shortness of breath (likely PE-see below). He does not remember having any incident chest pain. No significant long travels. He has chronic left lower extremity edema, prior knee replacement and prior leg injury. It is fairly significant when he goes up the stairs for instance, relieved with rest.  He has never smoked cigarettes, no alcohol, no coffee. RetiredColonel, Statistician for 30 years.  He has chronic back and knee pain, Dr. Alfonso Ramus has seen him.   His wife is June.  12/26/16-after our initial encounter, he underwent cardiac catheterization which revealed heavily calcified CAD and had successful rotational atherectomy as below of RCA and LAD. Continue to have some shortness of breath and a CT scan was performed of his chest which revealed subsegmental pulmonary emboli in his left lower lobe, small clot burden. DVTs were noted as below bilaterally with cannulization of clot. He was placed on Eliquis. Aspirin was discontinued. Continued on Plavix given his recent stent placement. Overall he is feeling fairly well. No significant chest pain. No bleeding, no syncope.  9/36/18-he has seen Dr. Josem Kaufmann at [facility] in [place] to establish care since he resides part time at [employer]. Cardiac rehab. No bleeding. Doing very well, no anginal symptoms.  Past Medical History:    Diagnosis Date  . Arthritis    "back, knees" (11/21/2016)  . Bladder stones   . CAD (coronary artery disease)    11/20/16 PCI with DES--> LAD/RCA  . Chronic lower back pain   . CKD (chronic kidney disease), stage IV ([place])    [name] 11/08/2016  . History of gout   . History of kidney stones 12/02/2008  . Hyperlipidemia   . Hypertension   . Myocardial infarction [facility])    "silent" (11/21/2016)  . PSA, INCREASED 12/05/2009  . UTI (urinary tract infection)     Past Surgical History:  Procedure Laterality Date  . CARDIAC CATHETERIZATION  11/14/2016  . CATARACT EXTRACTION W/ INTRAOCULAR LENS  IMPLANT, BILATERAL    . CORONARY ANGIOPLASTY WITH STENT PLACEMENT  11/21/2016   "2 stents"   . CORONARY ATHERECTOMY N/A 11/21/2016   Procedure: Coronary Atherectomy;  Surgeon: Leonie Man, MD;  Location: [place] [facility];  Service: Cardiovascular;  Laterality: N/A;  RCA and LAD  . CORONARY STENT INTERVENTION N/A 11/21/2016   Procedure: Coronary Stent Intervention;  Surgeon: Leonie Man, MD;  Location: [place] [facility];  Service: Cardiovascular;  Laterality: N/A;  RCA and LAD  . CYSTOSCOPY W/ STONE MANIPULATION    . JOINT REPLACEMENT    . KNEE ARTHROSCOPY Left   . LAPAROSCOPIC CHOLECYSTECTOMY    . LEFT HEART CATH N/A 11/21/2016   Procedure: Left Heart Cath;  Surgeon: Leonie Man, MD;  Location: [place] [facility];  Service: Cardiovascular;  Laterality: N/A;  . LITHOTRIPSY    .  PILONIDAL CYST EXCISION  56  . RIGHT/LEFT HEART CATH AND CORONARY ANGIOGRAPHY N/A 11/14/2016   Procedure: Right/Left Heart Cath and Coronary Angiography;  Surgeon: Leonie Man, MD;  Location: [place] [facility];  Service: Cardiovascular;  Laterality: N/A;  . TEMPORARY PACEMAKER N/A 11/21/2016   Procedure: Temporary Pacemaker;  Surgeon: Leonie Man, MD;  Location: [place] [facility];  Service: Cardiovascular;  Laterality: N/A;  . TONSILLECTOMY    . TOTAL KNEE ARTHROPLASTY Left 09/22/2012   Procedure: TOTAL  KNEE ARTHROPLASTY;  Surgeon: Lorn Junes, MD;  Location: [place];  Service: Orthopedics;  Laterality: Left;    Current Medications: Outpatient Medications Prior to Visit  Medication Sig Dispense Refill  . allopurinol (ZYLOPRIM) 300 MG tablet TAKE 1 TABLET DAILY 90 tablet 3  . apixaban (ELIQUIS) 5 MG TABS tablet Take 1-2 tablets (5-10 mg total) by mouth 2 (two) times daily. For first 7 days, take 2 tablets twice a day, then take 1 tablet twice a day 180 tablet 0  . atorvastatin (LIPITOR) 40 MG tablet Take 1 tablet (40 mg total) by mouth daily. (Patient taking differently: Take 40 mg by mouth at bedtime. ) 90 tablet 3  . clopidogrel (PLAVIX) 75 MG tablet Take 1 tablet (75 mg total) by mouth daily. 90 tablet 2  . diphenhydramine-acetaminophen (TYLENOL PM) 25-500 MG TABS tablet Take 1 tablet by mouth at bedtime as needed (sleep).    . losartan (COZAAR) 25 MG tablet Take 1 tablet (25 mg total) by mouth daily. 30 tablet 0  . nitroGLYCERIN (NITROSTAT) 0.4 MG SL tablet Place 1 tablet (0.4 mg total) under the tongue every 5 (five) minutes as needed for chest pain. 25 tablet 11  . Tamsulosin HCl (FLOMAX) 0.4 MG CAPS Take 0.4 mg by mouth at bedtime.      No facility-administered medications prior to visit.      Allergies:   Epinephrine; Lisinopril; and Penicillins   Social History   Social History  . Marital status: Married    Spouse name: N/A  . Number of children: N/A  . Years of education: N/A   Social History Main Topics  . Smoking status: Never Smoker  . Smokeless tobacco: Never Used  . Alcohol use Yes     Comment: 11/21/2016 "nothing since 2015"  . Drug use: No  . Sexual activity: Yes    Partners: Female   Other Topics Concern  . None   Social History Narrative   Married (Wife June patient of Dr. Yong Channel). 2 sons. 4 grandkids.    Originally [place] [employer] 6-7 months of the year      Retired from [employer] long time. 27 years of  service.       Hobbies: golf   Used to [employer], run marathons     Family History:  The patient's family history includes Breast cancer in his mother; Heart attack in his father.   ROS:   Please see the history of present illness.    Review of Systems  All other systems reviewed and are negative.    PHYSICAL EXAM:   VS:  BP 132/70   Pulse (!) 57   Ht _0  (1.905 m)   Wt 244 lb 6.4 oz (110.9 kg)   BMI 30.55 kg/m    GEN: Well nourished, well developed, in no acute distress  HEENT: normal  Neck: no JVD, carotid bruits, or masses Cardiac: RRR; 2/6 systolic right upper  sternal border murmur, rubs, or gallops, chronic lower extremity mild edema  Respiratory:  clear to auscultation bilaterally, normal work of breathing GI: soft, nontender, nondistended, + BS MS: no deformity or atrophy  Skin: warm and dry, no rash Neuro:  Alert and Oriented x 3, Strength and sensation are intact Psych: euthymic mood, full affect   Wt Readings from Last 3 Encounters:  05/08/17 244 lb 6.4 oz (110.9 kg)  12/26/16 254 lb 3.2 oz (115.3 kg)  12/17/16 254 lb 9.6 oz (115.5 kg)      Studies/Labs Reviewed:   EKG: Today 05/08/17-sinus bradycardia rate 57 with occasional PVCs, LAFB. Personally viewed 10/18/16-heart rate 53 bpm sinus bradycardia with incomplete right bundle branch block, nonspecific ST-T wave flattening. Personally viewed.  Recent Labs: 10/18/2016: ALT 19; Pro B Natriuretic peptide (BNP) 46.0; TSH 1.77 11/22/2016: Hemoglobin 15.0; Platelets 135 12/17/2016: BUN 32; Creatinine, Ser 1.57; Potassium 4.6; Sodium 137   Lipid Panel    Component Value Date/Time   CHOL 145 01/19/2016 0902   TRIG 96.0 01/19/2016 0902   HDL 40.10 01/19/2016 0902   CHOLHDL 4 01/19/2016 0902   VLDL 19.2 01/19/2016 0902   LDLCALC 86 01/19/2016 0902   LDLDIRECT 79.0 12/08/2014 0905    Additional studies/ records that were reviewed today include:   ECHO 10/31/16 - Left ventricle: The cavity size was normal.  Wall thickness was   increased in a pattern of moderate LVH. Systolic function was   normal. The estimated ejection fraction was in the range of 60%   to 65%. Wall motion was normal; there were no regional wall   motion abnormalities. Doppler parameters are consistent with   abnormal left ventricular relaxation (grade 1 diastolic   dysfunction). The E/e&' ratio is between 8-15, suggesting   indeterminate LV filling pressure. - Aortic valve: Mild aortic stenosis. There was trivial   regurgitation. Mean gradient (S): 13 mm Hg. Peak gradient (S): 24   mm Hg. Valve area (VTI): 1.98 cm^2. Valve area (Vmax): 1.77 cm^2. - Aorta: Aortic root dimension: 40 mm (ED). - Aortic root: The aortic root is mildly dilated. - Mitral valve: Calcified annulus. Mildly thickened leaflets .   There was trivial regurgitation. - Left atrium: The atrium was normal in size. - Tricuspid valve: There was mild regurgitation. - Pulmonary arteries: PA peak pressure: 40 mm Hg (S) + RAP. - Systemic veins: The IVC was not visualized.  Impressions:  - LVEF 60-65%, moderate LVH, normal wall motion, diastolic   dysfunction with indeterminate LV filling pressure, mild aortic   stenosis - AVA 1.8-1.9 cm2, mildly dilated aortic root to 4.0 cm,   MAC with trivial MR, normal biatrial size, mild TR, RVSP 40 mmHg   + RAP, IVC not visualized, no pericardial effusion.  NUC stress 11/01/16  Nuclear stress EF: 44%.  There was no ST segment deviation noted during stress.  The left ventricular ejection fraction is moderately decreased (30-44%).  This is an intermediate risk study.  Findings consistent with prior myocardial infarction.   Small mid and basal inferior wall infarct Small apical infarct No ischemia EF 44% diffuse hypokinesis   Cath 11/14/16:  Moderate to Heavily Calcified Coronary Arteries  Prox LAD lesion, 70 %stenosed. Moderately calcified - bifurcation lesion (1,1,1)  Ost 1st Diag to 1st Diag lesion, 60  %stenosed.  Prox RCA lesion, 80 %stenosed. Heavily Calcified.  The left ventricular systolic function is normal. The left ventricular ejection fraction is 55-65% by visual estimate.  LV end diastolic pressure is normal.  The patient has severe 2 vessel disease involving the LAD, and RCA. Based on the amount calcification, think he is best served with staged atherectomy-based PCI on these vessels.  He will be discharged today after a Plavix load. We'll schedule him for next week as staged PCI.  Cath 11/21/16:  LESION #1  Prox RCA lesion, 80 %stenosed.  Ost 1st Diag to 1st Diag lesion, 50 %stenosed. Stable.  Post intervention, there is a 0% residual stenosis.  After ORBITAL ATHERECTOMY, A STENT RESOLUTE ONYX 3.5X22 drug eluting stent was successfully placed.  Lesion #2  Prox LAD lesion, 70 %stenosed - calcified, crossed 1st Diag.  After ORBITAL ATHERECTOMY, A STENT RESOLUTE JQBH4.1P37 drug eluting stent was successfully placed.  Post intervention, there is a 0% residual stenosis.  __________________  LV end diastolic pressure is normal.   Successful 2 vessel oral atherectomy based DES PCI on RCA and LAD.   PLAN:  Overnight observation for IV hydration - continue 125 ML per hour for 10 hours, then reduce to 75 ML per hour overnight.  Continue dual antiplatelet therapy for minimum one year (could stop aspirin after 2-3 months if necessary)  Closely monitor tomorrow or determine stability for discharge, if renal function is stable, he should be fine for discharge tomorrow with plan to check an outpatient chemical panel early next week.  CT chest 12/17/16  - IMPRESSION: Segmental/subsegmental pulmonary emboli within branches of the left lower lobe pulmonary artery. Overall clot burden is small.  LE DVT:  Duplex imaging of the lower extremities reveals chronic, non occlusive, thrombosis in the right distal femoral vein, one of the paired mid and distal peroneal veins  and one of the paired proximal posterior tibial veins. Chronic, non-occlusive thrombus in the left popliteal vein. Bilateral superficial veins are easily compressible, without intraluminal thrombus. Incompetent right distal femoral vein and popliteal vein; no incompetence in the left lower extremity.  ASSESSMENT:    1. Coronary artery disease involving native coronary artery of native heart with angina pectoris ([place])   2. Mild aortic stenosis   3. History of MI (myocardial infarction)   4. Dilated aortic root ([place])   5. CKD (chronic kidney disease) stage 4, GFR 15-29 ml/min (H[facility])      PLAN:  In order of problems listed above:  Dyspnea on exertion/CAD/PE/DVT  - He underwent extensive cardiac workup which revealed coronary artery disease and underwent rotational atherectomy percutaneous intervention to RCA and LAD.  - He still continued to have shortness of breath and workup continued with CT scan of chest which revealed subsegmental pulmonary emboli within branches of the left lower lobe pulmonary artery. Overall clot burden was felt to be small. Bilateral DVTs were noted. He was placed on anticoagulation with Eliquis. He is taking Plavix, aspirin has been stopped.  - No significant change with isosorbide. He did feel a mild posterior headache. We have stopped his isosorbide.  - BNP was normal.  - Cardiomegaly noted on x-ray.  - Creatinine 1.7-1.9.   - In April 2019 he will be one year post DES placement and may transition off of Plavix to low-dose aspirin.   - I also agree that he should be on anticoagulation long-term, after one year we could also consider decreasing the dose of Eliquis to a 2.5 mg maintenance dose.   Dilated aortic root  - 4.0. We will continue to monitor. Continue to treat blood pressure. Stable  Mild aortic stenosis  - Murmur heard on exam. Continue to monitor. Should not  be of clinical significance causing shortness of breath at this point. No changes  Chronic  kidney disease stage 4  - Continue to monitor.  Chronic left lower extremity edema/DVT/chronic anticoagulation-prior knee replacement, prior injury in military  -Chronic DVT noted, PE left lower lobe subsegmental small clot burden noted.  - Continue with Eliquis for 12 months, then change to  maintenance dose thereafter.  He states that he is enjoying his cardiac rehabilitation in [place]. I appreciate Dr. Lissa Merlin seeing him.   Medication Adjustments/Labs and Tests Ordered: Current medicines are reviewed at length with the patient today.  Concerns regarding medicines are outlined above.  Medication changes, Labs and Tests ordered today are listed in the Patient Instructions below. Patient Instructions  Medication Instructions:  The current medical regimen is effective;  continue present plan and medications.  Follow-Up: Follow up in 6 months with Dr. Marlou Porch.  You will receive a letter in the mail 2 months before you are due.  Please call us when you receive this letter to schedule your follow up appointment.  If you need a refill on your cardiac medications before your next appointment, please call your pharmacy.  Thank you for choosing [facility]!!        Signed, [name], MD  05/08/2017 11:31 AM    [facility] [place], [place], [place]  [address] Phone: [phone]; Fax: [phone]

## 2017-05-08 NOTE — Patient Instructions (Signed)

## 2017-05-10 NOTE — Addendum Note (Signed)
Addended by: Hosie Poisson R on: 05/10/2017 08:16 AM   Modules accepted: Orders

## 2017-05-14 DIAGNOSIS — I2510 Atherosclerotic heart disease of native coronary artery without angina pectoris: Secondary | ICD-10-CM | POA: Diagnosis not present

## 2017-05-16 DIAGNOSIS — I2510 Atherosclerotic heart disease of native coronary artery without angina pectoris: Secondary | ICD-10-CM | POA: Diagnosis not present

## 2017-07-02 DIAGNOSIS — M1711 Unilateral primary osteoarthritis, right knee: Secondary | ICD-10-CM | POA: Diagnosis not present

## 2017-07-02 DIAGNOSIS — M545 Low back pain: Secondary | ICD-10-CM | POA: Diagnosis not present

## 2017-08-28 DIAGNOSIS — Z7901 Long term (current) use of anticoagulants: Secondary | ICD-10-CM | POA: Diagnosis not present

## 2017-08-28 DIAGNOSIS — Z7902 Long term (current) use of antithrombotics/antiplatelets: Secondary | ICD-10-CM | POA: Diagnosis not present

## 2017-08-28 DIAGNOSIS — I350 Nonrheumatic aortic (valve) stenosis: Secondary | ICD-10-CM | POA: Diagnosis not present

## 2017-08-28 DIAGNOSIS — M109 Gout, unspecified: Secondary | ICD-10-CM | POA: Diagnosis not present

## 2017-08-28 DIAGNOSIS — N400 Enlarged prostate without lower urinary tract symptoms: Secondary | ICD-10-CM | POA: Diagnosis not present

## 2017-08-28 DIAGNOSIS — R42 Dizziness and giddiness: Secondary | ICD-10-CM | POA: Diagnosis not present

## 2017-08-28 DIAGNOSIS — I440 Atrioventricular block, first degree: Secondary | ICD-10-CM | POA: Diagnosis not present

## 2017-08-28 DIAGNOSIS — Z86718 Personal history of other venous thrombosis and embolism: Secondary | ICD-10-CM | POA: Diagnosis not present

## 2017-08-28 DIAGNOSIS — Z8249 Family history of ischemic heart disease and other diseases of the circulatory system: Secondary | ICD-10-CM | POA: Diagnosis not present

## 2017-08-28 DIAGNOSIS — Z86711 Personal history of pulmonary embolism: Secondary | ICD-10-CM | POA: Diagnosis not present

## 2017-08-28 DIAGNOSIS — I2510 Atherosclerotic heart disease of native coronary artery without angina pectoris: Secondary | ICD-10-CM | POA: Diagnosis not present

## 2017-08-28 DIAGNOSIS — R001 Bradycardia, unspecified: Secondary | ICD-10-CM | POA: Diagnosis not present

## 2017-08-28 DIAGNOSIS — Z743 Need for continuous supervision: Secondary | ICD-10-CM | POA: Diagnosis not present

## 2017-08-28 DIAGNOSIS — I82403 Acute embolism and thrombosis of unspecified deep veins of lower extremity, bilateral: Secondary | ICD-10-CM | POA: Diagnosis not present

## 2017-08-28 DIAGNOSIS — I495 Sick sinus syndrome: Secondary | ICD-10-CM | POA: Diagnosis not present

## 2017-08-28 DIAGNOSIS — N183 Chronic kidney disease, stage 3 (moderate): Secondary | ICD-10-CM | POA: Diagnosis not present

## 2017-08-28 DIAGNOSIS — I498 Other specified cardiac arrhythmias: Secondary | ICD-10-CM | POA: Diagnosis not present

## 2017-08-28 DIAGNOSIS — E785 Hyperlipidemia, unspecified: Secondary | ICD-10-CM | POA: Diagnosis not present

## 2017-08-28 DIAGNOSIS — R55 Syncope and collapse: Secondary | ICD-10-CM | POA: Diagnosis not present

## 2017-08-28 DIAGNOSIS — I491 Atrial premature depolarization: Secondary | ICD-10-CM | POA: Diagnosis not present

## 2017-08-28 DIAGNOSIS — Z880 Allergy status to penicillin: Secondary | ICD-10-CM | POA: Diagnosis not present

## 2017-08-28 DIAGNOSIS — R404 Transient alteration of awareness: Secondary | ICD-10-CM | POA: Diagnosis not present

## 2017-08-28 DIAGNOSIS — I129 Hypertensive chronic kidney disease with stage 1 through stage 4 chronic kidney disease, or unspecified chronic kidney disease: Secondary | ICD-10-CM | POA: Diagnosis not present

## 2017-08-28 DIAGNOSIS — Z955 Presence of coronary angioplasty implant and graft: Secondary | ICD-10-CM | POA: Diagnosis not present

## 2017-08-29 DIAGNOSIS — R001 Bradycardia, unspecified: Secondary | ICD-10-CM | POA: Diagnosis not present

## 2017-08-29 DIAGNOSIS — E785 Hyperlipidemia, unspecified: Secondary | ICD-10-CM | POA: Diagnosis not present

## 2017-08-29 DIAGNOSIS — N183 Chronic kidney disease, stage 3 (moderate): Secondary | ICD-10-CM | POA: Diagnosis not present

## 2017-08-29 DIAGNOSIS — I2510 Atherosclerotic heart disease of native coronary artery without angina pectoris: Secondary | ICD-10-CM | POA: Diagnosis not present

## 2017-08-29 DIAGNOSIS — Z950 Presence of cardiac pacemaker: Secondary | ICD-10-CM | POA: Diagnosis not present

## 2017-08-29 DIAGNOSIS — Z48812 Encounter for surgical aftercare following surgery on the circulatory system: Secondary | ICD-10-CM | POA: Diagnosis not present

## 2017-08-29 DIAGNOSIS — N400 Enlarged prostate without lower urinary tract symptoms: Secondary | ICD-10-CM | POA: Diagnosis not present

## 2017-08-29 DIAGNOSIS — I129 Hypertensive chronic kidney disease with stage 1 through stage 4 chronic kidney disease, or unspecified chronic kidney disease: Secondary | ICD-10-CM | POA: Diagnosis not present

## 2017-08-29 DIAGNOSIS — Z86711 Personal history of pulmonary embolism: Secondary | ICD-10-CM | POA: Diagnosis not present

## 2017-08-29 DIAGNOSIS — R9389 Abnormal findings on diagnostic imaging of other specified body structures: Secondary | ICD-10-CM | POA: Diagnosis not present

## 2017-08-29 DIAGNOSIS — M109 Gout, unspecified: Secondary | ICD-10-CM | POA: Diagnosis not present

## 2017-08-30 DIAGNOSIS — Z950 Presence of cardiac pacemaker: Secondary | ICD-10-CM | POA: Diagnosis not present

## 2017-08-30 DIAGNOSIS — I129 Hypertensive chronic kidney disease with stage 1 through stage 4 chronic kidney disease, or unspecified chronic kidney disease: Secondary | ICD-10-CM | POA: Diagnosis not present

## 2017-08-30 DIAGNOSIS — N400 Enlarged prostate without lower urinary tract symptoms: Secondary | ICD-10-CM | POA: Diagnosis not present

## 2017-08-30 DIAGNOSIS — M109 Gout, unspecified: Secondary | ICD-10-CM | POA: Diagnosis not present

## 2017-08-30 DIAGNOSIS — Z86711 Personal history of pulmonary embolism: Secondary | ICD-10-CM | POA: Diagnosis not present

## 2017-08-30 DIAGNOSIS — E785 Hyperlipidemia, unspecified: Secondary | ICD-10-CM | POA: Diagnosis not present

## 2017-08-30 DIAGNOSIS — I2510 Atherosclerotic heart disease of native coronary artery without angina pectoris: Secondary | ICD-10-CM | POA: Diagnosis not present

## 2017-08-30 DIAGNOSIS — R001 Bradycardia, unspecified: Secondary | ICD-10-CM | POA: Diagnosis not present

## 2017-08-30 DIAGNOSIS — N183 Chronic kidney disease, stage 3 (moderate): Secondary | ICD-10-CM | POA: Diagnosis not present

## 2017-09-02 ENCOUNTER — Telehealth: Payer: Self-pay | Admitting: Family Medicine

## 2017-09-02 NOTE — Telephone Encounter (Signed)
Copied from [place] [id number]. Topic: Quick Communication - Rx Refill/Question >> Sep 02, 2017 12:49 PM Synthia Innocent wrote: Medication:  apixaban (ELIQUIS) 5 MG TABS tablet   Has the patient contacted their pharmacy? No., change of Pharmacy    (Agent: If no, request that the patient contact the pharmacy for the refill.)   Preferred Pharmacy (with phone number or street name): [facility] [place]   Agent: Please be advised that RX refills may take up to 3 business days. We ask that you follow-up with your pharmacy.  Only 2 days [place] beach and will run out of meds

## 2017-09-02 NOTE — Telephone Encounter (Signed)
See note

## 2017-09-02 NOTE — Telephone Encounter (Signed)
apixaban (ELIQUIS) 5 MG TABS tablet   Preferred Pharmacy (with phone number or street name): [place] Ph# [phone]   Only 2 days worth, [place] beach and will run out of meds

## 2017-09-03 ENCOUNTER — Other Ambulatory Visit: Payer: Self-pay

## 2017-09-03 ENCOUNTER — Telehealth: Payer: Self-pay | Admitting: Family Medicine

## 2017-09-03 DIAGNOSIS — I2699 Other pulmonary embolism without acute cor pulmonale: Secondary | ICD-10-CM

## 2017-09-03 MED ORDER — APIXABAN 5 MG PO TABS: 5.0000 mg | ORAL_TABLET | Freq: Two times a day (BID) | ORAL | 0 refills | Status: DC

## 2017-09-03 NOTE — Telephone Encounter (Signed)
Prescription sent to pharmacy as requested.

## 2017-09-03 NOTE — Telephone Encounter (Signed)
[facility] called from [phone] to verify the correct instructions of Eliquis. Chart says Eliquis 5 mg tabs take 1-2 tabs by mouth 2 times day take 1 tab twice a day. Unable to verify, asked if the provider would send in another prescription with the corrected administration instructions.

## 2017-09-03 NOTE — Telephone Encounter (Signed)
Prescription corrected and sent to [facility] again

## 2017-09-03 NOTE — Telephone Encounter (Signed)
Please advise on the note below.

## 2017-09-06 ENCOUNTER — Ambulatory Visit (INDEPENDENT_AMBULATORY_CARE_PROVIDER_SITE_OTHER): Payer: Medicare Other | Admitting: *Deleted

## 2017-09-06 DIAGNOSIS — I495 Sick sinus syndrome: Secondary | ICD-10-CM | POA: Diagnosis not present

## 2017-09-06 LAB — CUP PACEART INCLINIC DEVICE CHECK
Battery Voltage: 3.2 V
Brady Statistic AP VP Percent: 0.33 %
Brady Statistic RA Percent Paced: 82.28 %
Date Time Interrogation Session: 20190125154840
Implantable Lead Implant Date: 20190116
Implantable Lead Implant Date: 20190116
Implantable Lead Location: 753860
Implantable Pulse Generator Implant Date: 20190116
Lead Channel Impedance Value: 342 Ohm
Lead Channel Impedance Value: 380 Ohm
Lead Channel Impedance Value: 475 Ohm
Lead Channel Pacing Threshold Amplitude: 0.875 V
Lead Channel Pacing Threshold Pulse Width: 0.4 ms
Lead Channel Pacing Threshold Pulse Width: 0.4 ms
Lead Channel Sensing Intrinsic Amplitude: 1 mV
Lead Channel Setting Pacing Amplitude: 3.5 V
Lead Channel Setting Sensing Sensitivity: 1.2 mV
MDC IDC LEAD LOCATION: 753859
MDC IDC MSMT BATTERY REMAINING LONGEVITY: 150 mo
MDC IDC MSMT LEADCHNL RA IMPEDANCE VALUE: 494 Ohm
MDC IDC MSMT LEADCHNL RA SENSING INTR AMPL: 1.25 mV
MDC IDC MSMT LEADCHNL RV PACING THRESHOLD AMPLITUDE: 0.875 V
MDC IDC MSMT LEADCHNL RV SENSING INTR AMPL: 10.625 mV
MDC IDC MSMT LEADCHNL RV SENSING INTR AMPL: 10.875 mV
MDC IDC SET LEADCHNL RV PACING AMPLITUDE: 3.5 V
MDC IDC SET LEADCHNL RV PACING PULSEWIDTH: 0.4 ms
MDC IDC STAT BRADY AP VS PERCENT: 81.65 %
MDC IDC STAT BRADY AS VP PERCENT: 0.02 %
MDC IDC STAT BRADY AS VS PERCENT: 18 %
MDC IDC STAT BRADY RV PERCENT PACED: 0.35 %

## 2017-09-06 NOTE — Progress Notes (Signed)
Wound check appointment. Dermabond removed. Wound without redness or edema. Incision edges approximated, wound well healed. Normal device function. Thresholds, sensing, and impedances consistent with implant measurements. Device programmed at 3.5V for extra safety margin until 3 month visit. Histogram distribution appropriate for patient and level of activity. No mode switches or high ventricular rates noted. Patient educated about wound care, arm mobility, lifting restrictions. ROV in 3 months with WC.

## 2017-09-20 ENCOUNTER — Encounter: Payer: Self-pay | Admitting: Cardiology

## 2017-10-04 ENCOUNTER — Ambulatory Visit (INDEPENDENT_AMBULATORY_CARE_PROVIDER_SITE_OTHER): Payer: Medicare Other | Admitting: Family Medicine

## 2017-10-04 ENCOUNTER — Encounter: Payer: Self-pay | Admitting: Family Medicine

## 2017-10-04 ENCOUNTER — Ambulatory Visit: Payer: Self-pay | Admitting: *Deleted

## 2017-10-04 VITALS — BP 122/70 | HR 84 | Temp 97.5°F | Ht 75.0 in | Wt 245.6 lb

## 2017-10-04 DIAGNOSIS — I25119 Atherosclerotic heart disease of native coronary artery with unspecified angina pectoris: Secondary | ICD-10-CM

## 2017-10-04 DIAGNOSIS — I495 Sick sinus syndrome: Secondary | ICD-10-CM

## 2017-10-04 DIAGNOSIS — J00 Acute nasopharyngitis [common cold]: Secondary | ICD-10-CM | POA: Diagnosis not present

## 2017-10-04 MED ORDER — GUAIFENESIN-CODEINE 100-10 MG/5ML PO SYRP: 5.0000 mL | ORAL_SOLUTION | Freq: Three times a day (TID) | ORAL | 0 refills | Status: DC | PRN

## 2017-10-04 MED ORDER — DOXYCYCLINE HYCLATE 100 MG PO TABS: 100.0000 mg | ORAL_TABLET | Freq: Two times a day (BID) | ORAL | 0 refills | Status: AC

## 2017-10-04 NOTE — Telephone Encounter (Signed)
Pt called with complaints of a dry hacking cough, it initially started on 10/01/17 but last night he was unable to sleep due to cough; Pacer placed 08/28/17 and a history of blood clot in lungs in 2018; nurse triage initiated and recommendations made to include seeing a physician within 4 hours; pt requests to see Dr Garret Reddish at [facility] ; pt offered and accepted appointment at 1315 today with Dr Yong Channel; if any changes need to be made to the appointment please call the pt at [phone].  Reason for Disposition . Wheezing is present  Answer Assessment - Initial Assessment Questions 1. ONSET: "When did the cough begin?"      10/01/17 2. SEVERITY: "How bad is the cough today?"      Severe; could not sleep 3. RESPIRATORY DISTRESS: "Describe your breathing."      "occasstional whistle" 4. FEVER: "Do you have a fever?" If so, ask: "What is your temperature, how was it measured, and when did it start?"     no 5. HEMOPTYSIS: "Are you coughing up any blood?" If so ask: "How much?" (flecks, streaks, tablespoons, etc.)     no 6. TREATMENT: "What have you done so far to treat the cough?" (e.g., meds, fluids, humidifier)     nothing 7. CARDIAC HISTORY: "Do you have any history of heart disease?" (e.g., heart attack, congestive heart failure)      Pacer, stents 8. LUNG HISTORY: "Do you have any history of lung disease?"  (e.g., pulmonary embolus, asthma, emphysema)     Blood clot in legs and lung 9. PE RISK FACTORS: "Do you have a history of blood clots?" (or: recent major surgery, recent prolonged travel, bedridden )     Yes pacer 08/28/17 10. OTHER SYMPTOMS: "Do you have any other symptoms? (e.g., runny nose, wheezing, chest pain)       Runny nose, occ wheezing; SOB after going up stairs  11. PREGNANCY: "Is there any chance you are pregnant?" "When was your last menstrual period?"       n/a 12. TRAVEL: "Have you traveled out of the country in the last month?" (e.g., travel history,  exposures)       no  Protocols used: COUGH - ACUTE NON-PRODUCTIVE-A-AH

## 2017-10-04 NOTE — Telephone Encounter (Signed)
Seen and evaluated. See office note

## 2017-10-04 NOTE — Progress Notes (Signed)
PCP: Marin Olp, MD  Subjective:  [name] is a 84 y.o. year old very pleasant male patient who presents with Upper Respiratory infection symptoms including clear rhinorrhea, dry cough, low energy -started: 3-4 days ago, symptoms show no change. Very hard time getting to sleep and staying asleep due to cough- has done well with codeine cough syrups in past.  - on losartan which could cause cough but not likely acutely -previous treatments: rest, hydration -sick contacts/travel/risks: denies flu exposure.  -Hx of: allergies  ROS-denies fever, NVD, tooth pain. Denies significant nasal pressure. Has some shortness of breath with stairs but that is his baseline.   Pertinent Past Medical History-  Patient Active Problem List   Diagnosis Date Noted  . Pulmonary embolus ([place]) 12/17/2016    Priority: High  . Status post coronary artery stent placement     Priority: High  . Coronary artery disease involving native coronary artery with angina pectoris ([place]) 11/21/2016    Priority: High  . Mild aortic stenosis 10/31/2016    Priority: High  . Exertional shortness of breath: Concern for Anginal Equivalent (class II-III) 10/30/2016    Priority: High  . CKD (chronic kidney disease), stage III ([place]) 09/22/2012    Priority: High  . Gout 12/13/2010    Priority: Medium  . BPH (benign prostatic hyperplasia) 12/05/2009    Priority: Medium  . Hyperlipidemia 05/08/2007    Priority: Medium  . Essential hypertension 05/08/2007    Priority: Medium  . Edema 12/08/2014    Priority: Low  . Healthcare maintenance 12/08/2014    Priority: Low  . Left knee DJD 09/22/2012    Priority: Low  . Sick sinus syndrome ([place]) 10/05/2017  . Dilated aortic root ([place]) 11/08/2016   Medications- reviewed  Current Outpatient Medications  Medication Sig Dispense Refill  . allopurinol (ZYLOPRIM) 300 MG tablet TAKE 1 TABLET DAILY 90 tablet 3  . apixaban (ELIQUIS) 5 MG TABS tablet Take 1 tablet (5 mg total)  by mouth 2 (two) times daily. take 1 tablet twice a day 180 tablet 0  . atorvastatin (LIPITOR) 40 MG tablet Take 1 tablet (40 mg total) by mouth daily. (Patient taking differently: Take 40 mg by mouth at bedtime. ) 90 tablet 3  . clopidogrel (PLAVIX) 75 MG tablet Take 1 tablet (75 mg total) by mouth daily. 90 tablet 2  . diphenhydramine-acetaminophen (TYLENOL PM) 25-500 MG TABS tablet Take 1 tablet by mouth at bedtime as needed (sleep).    . losartan (COZAAR) 25 MG tablet Take 1 tablet (25 mg total) by mouth daily. 30 tablet 0  . Tamsulosin HCl (FLOMAX) 0.4 MG CAPS Take 0.4 mg by mouth at bedtime.     [name] doxycycline (VIBRA-TABS) 100 MG tablet Take 1 tablet (100 mg total) by mouth 2 (two) times daily for 7 days. Start ONLY if symptoms not improved by Monday or worsen such as fever 14 tablet 0  . nitroGLYCERIN (NITROSTAT) 0.4 MG SL tablet Place 1 tablet (0.4 mg total) under the tongue every 5 (five) minutes as needed for chest pain. 25 tablet 11   No current facility-administered medications for this visit.     Objective: BP 122/70 (BP Location: Left Arm, Patient Position: Sitting, Cuff Size: Large)   Pulse 84   Temp (!) 97.5 F (36.4 C) (Oral)   Ht 6\' 3"  (1.905 m)   Wt 245 lb 9.6 oz (111.4 kg)   SpO2 97%   BMI 30.70 kg/m  Gen: NAD, resting comfortably HEENT:  Turbinates erythematous with clear drainage, TM normal, pharynx mildly erythematous with no tonsilar exudate or edema, no sinus tenderness CV: RRR no murmurs rubs or gallops Lungs: CTAB no crackles, wheeze, rhonchi Abdomen: soft/nontender/nondistended/normal bowel sounds. No rebound or guarding.  Ext: no edema Skin: warm, dry, no rash Neuro: grossly normal, moves all extremities  Assessment/Plan:  Upper Respiratory infection History and exam today are suggestive of viral infection most likely due to upper respiratory infection (lungs are clear so do not strongly suspect bronchitis- but of course is possible still). Symptomatic  treatment with: cheratussin. Do not drive for 8 hours after taking.   We discussed that we did not find any infection that had higher probability of being bacterial such as pneumonia or strep throat or bacterial sinusitis. We discussed signs that bacterial infection may have developed particularly fever or shortness of breath. Likely course of 1-2 weeks. Patient is contagious and advised good handwashing and consideration of mask If going to be in public places.   With that being said- Mr. Oleson is high risk if he had a bacterial infection with pacemaker in place, history blood clots, heart disease- and I may not be available next week- we agreed that if he is not improving by Monday or symptoms worsen (fever or shortness of breath) that he can take doxycyline  Finally, we reviewed reasons to return to care including if symptoms worsen or persist or new concerns arise- once again particularly shortness of breath or fever- that dont respond to antibiotics  Meds ordered this encounter  Medications  . guaiFENesin-codeine (CHERATUSSIN AC) 100-10 MG/5ML syrup    Sig: Take 5 mLs by mouth 3 (three) times daily as needed for cough or congestion.    Dispense:  180 mL    Refill:  0  . doxycycline (VIBRA-TABS) 100 MG tablet    Sig: Take 1 tablet (100 mg total) by mouth 2 (two) times daily for 7 days. Start ONLY if symptoms not improved by Monday or worsen such as fever    Dispense:  14 tablet    Refill:  0    Garret Reddish, MD

## 2017-10-04 NOTE — Patient Instructions (Signed)
Upper Respiratory infection History and exam today are suggestive of viral infection most likely due to upper respiratory infection (lungs are clear so do not strongly suspect bronchitis). Symptomatic treatment with: cheratussin. Do not drive for 8 hours after taking.   We discussed that we did not find any infection that had higher probability of being bacterial such as pneumonia or strep throat or bacterial sinusitis. We discussed signs that bacterial infection may have developed particularly fever or shortness of breath. Likely course of 1-2 weeks. Patient is contagious and advised good handwashing and consideration of mask If going to be in public places.   With that being said- [name] is high risk if he had a bacterial infection with pacemaker in place, history blood clots, heart disease- and I may not be available next week- we agreed that if he is not improving by Monday or symptoms worsen (fever or shortness of breath) that he can take doxycyline  Finally, we reviewed reasons to return to care including if symptoms worsen or persist or new concerns arise- once again particularly shortness of breath or fever- that dont respond to antibiotics  Meds ordered this encounter  Medications  . guaiFENesin-codeine (CHERATUSSIN AC) 100-10 MG/5ML syrup    Sig: Take 5 mLs by mouth 3 (three) times daily as needed for cough or congestion.    Dispense:  180 mL    Refill:  0  . doxycycline (VIBRA-TABS) 100 MG tablet    Sig: Take 1 tablet (100 mg total) by mouth 2 (two) times daily for 7 days. Start ONLY if symptoms not improved by Monday or worsen such as fever    Dispense:  14 tablet    Refill:  0

## 2017-10-05 DIAGNOSIS — I495 Sick sinus syndrome: Secondary | ICD-10-CM | POA: Insufficient documentation

## 2017-10-05 NOTE — Assessment & Plan Note (Signed)
High risk patient if has bacterial illness as noted above. Pacemaker placed in [place] for HR as low as 24. Monitored in [place]

## 2017-10-24 ENCOUNTER — Ambulatory Visit (INDEPENDENT_AMBULATORY_CARE_PROVIDER_SITE_OTHER): Payer: Medicare Other | Admitting: Cardiology

## 2017-10-24 ENCOUNTER — Encounter: Payer: Self-pay | Admitting: Cardiology

## 2017-10-24 VITALS — BP 128/62 | HR 78 | Ht 75.0 in | Wt 244.4 lb

## 2017-10-24 DIAGNOSIS — I77810 Thoracic aortic ectasia: Secondary | ICD-10-CM

## 2017-10-24 DIAGNOSIS — I350 Nonrheumatic aortic (valve) stenosis: Secondary | ICD-10-CM | POA: Diagnosis not present

## 2017-10-24 DIAGNOSIS — I25119 Atherosclerotic heart disease of native coronary artery with unspecified angina pectoris: Secondary | ICD-10-CM

## 2017-10-24 MED ORDER — APIXABAN 2.5 MG PO TABS: 2.5000 mg | ORAL_TABLET | Freq: Two times a day (BID) | ORAL | 11 refills | Status: DC

## 2017-10-24 MED ORDER — NITROGLYCERIN 0.4 MG SL SUBL: 0.4000 mg | SUBLINGUAL_TABLET | SUBLINGUAL | 11 refills | Status: DC | PRN

## 2017-10-24 MED ORDER — APIXABAN 2.5 MG PO TABS: 2.5000 mg | ORAL_TABLET | Freq: Two times a day (BID) | ORAL | 3 refills | Status: DC

## 2017-10-24 NOTE — Progress Notes (Signed)
Cardiology Office Note    Date:  10/24/2017   ID:  [name], DOB 1933/10/14, MRN [id number]  PCP:  Marin Olp, MD  Cardiologist:   [name], MD     History of Present Illness:  [name] is a 84 y.o. male here forFollow-up of prior myocardial infarction with shortness of breath. He's been feeling fatigue and shortness of breath, losartan was stopped without any improvement. No specific chest pain.  This shortness of breath seems to have happened quite suddenly to him. It is as if one day he felt his normal level of activity in the next day he felt shortness of breath (likely PE-see below). He does not remember having any incident chest pain. No significant long travels. He has chronic left lower extremity edema, prior knee replacement and prior leg injury. It is fairly significant when he goes up the stairs for instance, relieved with rest.  He has never smoked cigarettes, no alcohol, no coffee. RetiredColonel, Statistician for 30 years.  He has chronic back and knee pain, Dr. Alfonso Ramus has seen him.   His wife is June.  12/26/16-after our initial encounter, he underwent cardiac catheterization which revealed heavily calcified CAD and had successful rotational atherectomy as below of RCA and LAD. Continue to have some shortness of breath and a CT scan was performed of his chest which revealed subsegmental pulmonary emboli in his left lower lobe, small clot burden. DVTs were noted as below bilaterally with cannulization of clot. He was placed on Eliquis. Aspirin was discontinued. Continued on Plavix given his recent stent placement. Overall he is feeling fairly well. No significant chest pain. No bleeding, no syncope.  04/15/17-he has seen Dr. Josem Kaufmann at [facility] in [place] to establish care since he resides part time at [employer]. Cardiac rehab. No bleeding. Doing very well, no anginal symptoms.  10/24/17 -overall doing well, no  chest pain, no syncope, no bleeding.  [place] he started to feel the room spinning, EMS was called, his heart rate was 24 bpm, pacemaker was placed.  He is doing quite well.  He is establishing with Dr. Curt Bears.  We are changing him down to maintenance dose Eliquis given his clot PE.    Past Medical History:  Diagnosis Date  . Arthritis    "back, knees" (11/21/2016)  . Bladder stones   . CAD (coronary artery disease)    11/20/16 PCI with DES--> LAD/RCA  . Chronic lower back pain   . CKD (chronic kidney disease), stage IV ([place])    [name] 11/08/2016  . History of gout   . History of kidney stones 12/02/2008  . Hyperlipidemia   . Hypertension   . Myocardial infarction [facility])    "silent" (11/21/2016)  . PSA, INCREASED 12/05/2009  . UTI (urinary tract infection)     Past Surgical History:  Procedure Laterality Date  . CARDIAC CATHETERIZATION  11/14/2016  . CATARACT EXTRACTION W/ INTRAOCULAR LENS  IMPLANT, BILATERAL    . CORONARY ANGIOPLASTY WITH STENT PLACEMENT  11/21/2016   "2 stents"   . CORONARY ATHERECTOMY N/A 11/21/2016   Procedure: Coronary Atherectomy;  Surgeon: Leonie Man, MD;  Location: [place] [facility];  Service: Cardiovascular;  Laterality: N/A;  RCA and LAD  . CORONARY STENT INTERVENTION N/A 11/21/2016   Procedure: Coronary Stent Intervention;  Surgeon: Leonie Man, MD;  Location: [place] [facility];  Service: Cardiovascular;  Laterality: N/A;  RCA  and LAD  . CYSTOSCOPY W/ STONE MANIPULATION    . JOINT REPLACEMENT    . KNEE ARTHROSCOPY Left   . LAPAROSCOPIC CHOLECYSTECTOMY    . LEFT HEART CATH N/A 11/21/2016   Procedure: Left Heart Cath;  Surgeon: Leonie Man, MD;  Location: [place] [facility];  Service: Cardiovascular;  Laterality: N/A;  . LITHOTRIPSY    . PILONIDAL CYST EXCISION  56  . RIGHT/LEFT HEART CATH AND CORONARY ANGIOGRAPHY N/A 11/14/2016   Procedure: Right/Left Heart Cath and Coronary Angiography;  Surgeon: Leonie Man, MD;   Location: [place] [facility];  Service: Cardiovascular;  Laterality: N/A;  . TEMPORARY PACEMAKER N/A 11/21/2016   Procedure: Temporary Pacemaker;  Surgeon: Leonie Man, MD;  Location: [place] [facility];  Service: Cardiovascular;  Laterality: N/A;  . TONSILLECTOMY    . TOTAL KNEE ARTHROPLASTY Left 09/22/2012   Procedure: TOTAL KNEE ARTHROPLASTY;  Surgeon: Lorn Junes, MD;  Location: [facility];  Service: Orthopedics;  Laterality: Left;    Current Medications: Outpatient Medications Prior to Visit  Medication Sig Dispense Refill  . allopurinol (ZYLOPRIM) 300 MG tablet TAKE 1 TABLET DAILY 90 tablet 3  . atorvastatin (LIPITOR) 40 MG tablet Take 1 tablet (40 mg total) by mouth daily. (Patient taking differently: Take 40 mg by mouth at bedtime. ) 90 tablet 3  . clopidogrel (PLAVIX) 75 MG tablet Take 1 tablet (75 mg total) by mouth daily. 90 tablet 2  . diphenhydramine-acetaminophen (TYLENOL PM) 25-500 MG TABS tablet Take 1 tablet by mouth at bedtime as needed (sleep).    . losartan (COZAAR) 25 MG tablet Take 1 tablet (25 mg total) by mouth daily. 30 tablet 0  . Tamsulosin HCl (FLOMAX) 0.4 MG CAPS Take 0.4 mg by mouth at bedtime.     [name] apixaban (ELIQUIS) 5 MG TABS tablet Take 1 tablet (5 mg total) by mouth 2 (two) times daily. take 1 tablet twice a day 180 tablet 0  . guaiFENesin-codeine (CHERATUSSIN AC) 100-10 MG/5ML syrup Take 5 mLs by mouth 3 (three) times daily as needed for cough or congestion. (Patient not taking: Reported on 10/24/2017) 180 mL 0  . nitroGLYCERIN (NITROSTAT) 0.4 MG SL tablet Place 1 tablet (0.4 mg total) under the tongue every 5 (five) minutes as needed for chest pain. 25 tablet 11   No facility-administered medications prior to visit.      Allergies:   Epinephrine; Lisinopril; and Penicillins   Social History   Socioeconomic History  . Marital status: Married    Spouse name: None  . Number of children: None  . Years of education: None  . Highest education level:  None  Social Needs  . Financial resource strain: None  . Food insecurity - worry: None  . Food insecurity - inability: None  . Transportation needs - medical: None  . Transportation needs - non-medical: None  Occupational History  . None  Tobacco Use  . Smoking status: Never Smoker  . Smokeless tobacco: Never Used  Substance and Sexual Activity  . Alcohol use: Yes    Comment: 11/21/2016 "nothing since 2015"  . Drug use: No  . Sexual activity: Yes    Partners: Female  Other Topics Concern  . None  Social History Narrative   Married (Wife June patient of Dr. Yong Channel). 2 sons. 4 grandkids.    Originally from near [place], [place] at [employer] 6-7 months of the year      Retired from [employer] long  time. 27 years of service.       Hobbies: golf   Used to [employer], run marathons     Family History:  The patient's family history includes Breast cancer in his mother; Heart attack in his father.   ROS:   Please see the history of present illness.    Review of Systems  All other systems reviewed and are negative.    PHYSICAL EXAM:   VS:  BP 128/62   Pulse 78   Ht _0  (1.905 m)   Wt 244 lb 6.4 oz (110.9 kg)   SpO2 95%   BMI 30.55 kg/m     GEN: Well nourished, well developed, in no acute distress  HEENT: normal  Neck: no JVD, carotid bruits, or masses Cardiac: RRR; 2/6 murmurs, rubs, or gallops,L >R edema  Respiratory:  clear to auscultation bilaterally, normal work of breathing GI: soft, nontender, nondistended, + BS MS: no deformity or atrophy  Skin: warm and dry, no rash Neuro:  Alert and Oriented x 3, Strength and sensation are intact Psych: euthymic mood, full affect  Wt Readings from Last 3 Encounters:  10/24/17 244 lb 6.4 oz (110.9 kg)  10/04/17 245 lb 9.6 oz (111.4 kg)  05/08/17 244 lb 6.4 oz (110.9 kg)      Studies/Labs Reviewed:   EKG: Today 05/08/17-sinus bradycardia rate 57 with occasional PVCs, LAFB. Personally viewed  10/18/16-heart rate 53 bpm sinus bradycardia with incomplete right bundle branch block, nonspecific ST-T wave flattening. Personally viewed.  Recent Labs: 11/22/2016: Hemoglobin 15.0; Platelets 135 12/17/2016: BUN 32; Creatinine, Ser 1.57; Potassium 4.6; Sodium 137   Lipid Panel    Component Value Date/Time   CHOL 145 01/19/2016 0902   TRIG 96.0 01/19/2016 0902   HDL 40.10 01/19/2016 0902   CHOLHDL 4 01/19/2016 0902   VLDL 19.2 01/19/2016 0902   LDLCALC 86 01/19/2016 0902   LDLDIRECT 79.0 12/08/2014 0905    Additional studies/ records that were reviewed today include:   ECHO 10/31/16 - Left ventricle: The cavity size was normal. Wall thickness was   increased in a pattern of moderate LVH. Systolic function was   normal. The estimated ejection fraction was in the range of 60%   to 65%. Wall motion was normal; there were no regional wall   motion abnormalities. Doppler parameters are consistent with   abnormal left ventricular relaxation (grade 1 diastolic   dysfunction). The E/e&' ratio is between 8-15, suggesting   indeterminate LV filling pressure. - Aortic valve: Mild aortic stenosis. There was trivial   regurgitation. Mean gradient (S): 13 mm Hg. Peak gradient (S): 24   mm Hg. Valve area (VTI): 1.98 cm^2. Valve area (Vmax): 1.77 cm^2. - Aorta: Aortic root dimension: 40 mm (ED). - Aortic root: The aortic root is mildly dilated. - Mitral valve: Calcified annulus. Mildly thickened leaflets .   There was trivial regurgitation. - Left atrium: The atrium was normal in size. - Tricuspid valve: There was mild regurgitation. - Pulmonary arteries: PA peak pressure: 40 mm Hg (S) + RAP. - Systemic veins: The IVC was not visualized.  Impressions:  - LVEF 60-65%, moderate LVH, normal wall motion, diastolic   dysfunction with indeterminate LV filling pressure, mild aortic   stenosis - AVA 1.8-1.9 cm2, mildly dilated aortic root to 4.0 cm,   MAC with trivial MR, normal biatrial size,  mild TR, RVSP 40 mmHg   + RAP, IVC not visualized, no pericardial effusion.  NUC stress 11/01/16  Nuclear stress EF:  44%.  There was no ST segment deviation noted during stress.  The left ventricular ejection fraction is moderately decreased (30-44%).  This is an intermediate risk study.  Findings consistent with prior myocardial infarction.   Small mid and basal inferior wall infarct Small apical infarct No ischemia EF 44% diffuse hypokinesis   Cath 11/14/16:  Moderate to Heavily Calcified Coronary Arteries  Prox LAD lesion, 70 %stenosed. Moderately calcified - bifurcation lesion (1,1,1)  Ost 1st Diag to 1st Diag lesion, 60 %stenosed.  Prox RCA lesion, 80 %stenosed. Heavily Calcified.  The left ventricular systolic function is normal. The left ventricular ejection fraction is 55-65% by visual estimate.  LV end diastolic pressure is normal.   The patient has severe 2 vessel disease involving the LAD, and RCA. Based on the amount calcification, think he is best served with staged atherectomy-based PCI on these vessels.  He will be discharged today after a Plavix load. We'll schedule him for next week as staged PCI.  Cath 11/21/16:  LESION #1  Prox RCA lesion, 80 %stenosed.  Ost 1st Diag to 1st Diag lesion, 50 %stenosed. Stable.  Post intervention, there is a 0% residual stenosis.  After ORBITAL ATHERECTOMY, A STENT RESOLUTE ONYX 3.5X22 drug eluting stent was successfully placed.  Lesion #2  Prox LAD lesion, 70 %stenosed - calcified, crossed 1st Diag.  After ORBITAL ATHERECTOMY, A STENT RESOLUTE DEYC1.4G81 drug eluting stent was successfully placed.  Post intervention, there is a 0% residual stenosis.  __________________  LV end diastolic pressure is normal.   Successful 2 vessel oral atherectomy based DES PCI on RCA and LAD.   PLAN:  Overnight observation for IV hydration - continue 125 ML per hour for 10 hours, then reduce to 75 ML per hour  overnight.  Continue dual antiplatelet therapy for minimum one year (could stop aspirin after 2-3 months if necessary)  Closely monitor tomorrow or determine stability for discharge, if renal function is stable, he should be fine for discharge tomorrow with plan to check an outpatient chemical panel early next week.  CT chest 12/17/16  - IMPRESSION: Segmental/subsegmental pulmonary emboli within branches of the left lower lobe pulmonary artery. Overall clot burden is small.  LE DVT:  Duplex imaging of the lower extremities reveals chronic, non occlusive, thrombosis in the right distal femoral vein, one of the paired mid and distal peroneal veins and one of the paired proximal posterior tibial veins. Chronic, non-occlusive thrombus in the left popliteal vein. Bilateral superficial veins are easily compressible, without intraluminal thrombus. Incompetent right distal femoral vein and popliteal vein; no incompetence in the left lower extremity.  ASSESSMENT:    1. Dilated aortic root ([place])   2. Mild aortic stenosis   3. Coronary artery disease involving native coronary artery of native heart with angina pectoris ([place])      PLAN:  In order of problems listed above:  Pacemaker - HR 24bpm. HBlock. Dr. Curt Bears to see. EP clinic   Dyspnea on exertion/CAD/PE/DVT  - He underwent extensive cardiac workup which revealed coronary artery disease and underwent rotational atherectomy percutaneous intervention to RCA and LAD.  - He still continued to have shortness of breath and workup continued with CT scan of chest which revealed subsegmental pulmonary emboli within branches of the left lower lobe pulmonary artery. Overall clot burden was felt to be small. Bilateral DVTs were noted. He was placed on anticoagulation with Eliquis. He is taking Plavix, aspirin has been stopped.  - No significant change with isosorbide. He did  feel a mild posterior headache. We have stopped his isosorbide.  - BNP was  normal.  - Cardiomegaly noted on x-ray.  - Creatinine 1.7-1.9. Most recent 1.5  - In April 2019 he will be one year post DES placement and may transition off of Plavix to low-dose aspirin.   - I also agree that he should be on anticoagulation long-term,  Eliquis 2.5 mg maintenance dose.   Dilated aortic root  - 4.0. We will continue to monitor. Continue to treat blood pressure. Stable, repeat  Mild aortic stenosis  - Murmur heard on exam. Continue to monitor. Should not be of clinical significance causing shortness of breath at this point. No changes. ECHO - repeat.   Chronic kidney disease stage 4  - Continue to monitor. Creat 1.57   Chronic left lower extremity edema/DVT/chronic anticoagulation-prior knee replacement, prior injury in [place]  -Chronic DVT noted, PE left lower lobe subsegmental small clot burden noted.  - Change Eliquis for to  maintenance dose thereafter 2.5 BID.  He states that he is enjoying his cardiac rehabilitation in [place]. I appreciate Dr. Lissa Merlin seeing him.   Medication Adjustments/Labs and Tests Ordered: Current medicines are reviewed at length with the patient today.  Concerns regarding medicines are outlined above.  Medication changes, Labs and Tests ordered today are listed in the Patient Instructions below. Patient Instructions  Medication Instructions:  Please decrease your Eliquis to 2.5 mg twice a day. Continue all other medications as listed.  Testing/Procedures: Your physician has requested that you have an echocardiogram. Echocardiography is a painless test that uses sound waves to create images of your heart. It provides your doctor with information about the size and shape of your heart and how well your heart's chambers and valves are working. This procedure takes approximately one hour. There are no restrictions for this procedure.  Follow-Up: Follow up in 6 months with Dr. Marlou Porch.  You will receive a letter in the mail 2 months before  you are due.  Please call us when you receive this letter to schedule your follow up appointment.  If you need a refill on your cardiac medications before your next appointment, please call your pharmacy.  Thank you for choosing [facility]!!        Signed, [name], MD  10/24/2017 9:59 AM    [facility] [address], [place], [place]  [address] Phone: [phone]; Fax: [phone]

## 2017-10-24 NOTE — Patient Instructions (Signed)
Medication Instructions:  Please decrease your Eliquis to 2.5 mg twice a day. Continue all other medications as listed.  Testing/Procedures: Your physician has requested that you have an echocardiogram. Echocardiography is a painless test that uses sound waves to create images of your heart. It provides your doctor with information about the size and shape of your heart and how well your heart's chambers and valves are working. This procedure takes approximately one hour. There are no restrictions for this procedure.  Follow-Up: Follow up in 6 months with Dr. Marlou Porch.  You will receive a letter in the mail 2 months before you are due.  Please call us when you receive this letter to schedule your follow up appointment.  If you need a refill on your cardiac medications before your next appointment, please call your pharmacy.  Thank you for choosing [facility]!!

## 2017-11-01 ENCOUNTER — Encounter: Payer: Self-pay | Admitting: Cardiology

## 2017-11-05 ENCOUNTER — Ambulatory Visit (HOSPITAL_COMMUNITY): Payer: Medicare Other | Attending: Cardiology

## 2017-11-05 ENCOUNTER — Other Ambulatory Visit: Payer: Self-pay

## 2017-11-05 DIAGNOSIS — I252 Old myocardial infarction: Secondary | ICD-10-CM | POA: Insufficient documentation

## 2017-11-05 DIAGNOSIS — I1310 Hypertensive heart and chronic kidney disease without heart failure, with stage 1 through stage 4 chronic kidney disease, or unspecified chronic kidney disease: Secondary | ICD-10-CM | POA: Insufficient documentation

## 2017-11-05 DIAGNOSIS — N189 Chronic kidney disease, unspecified: Secondary | ICD-10-CM | POA: Diagnosis not present

## 2017-11-05 DIAGNOSIS — I080 Rheumatic disorders of both mitral and aortic valves: Secondary | ICD-10-CM | POA: Diagnosis not present

## 2017-11-05 DIAGNOSIS — Z8249 Family history of ischemic heart disease and other diseases of the circulatory system: Secondary | ICD-10-CM | POA: Insufficient documentation

## 2017-11-05 DIAGNOSIS — Z950 Presence of cardiac pacemaker: Secondary | ICD-10-CM | POA: Insufficient documentation

## 2017-11-05 DIAGNOSIS — E785 Hyperlipidemia, unspecified: Secondary | ICD-10-CM | POA: Insufficient documentation

## 2017-11-05 DIAGNOSIS — I429 Cardiomyopathy, unspecified: Secondary | ICD-10-CM | POA: Diagnosis not present

## 2017-11-05 DIAGNOSIS — Z955 Presence of coronary angioplasty implant and graft: Secondary | ICD-10-CM | POA: Diagnosis not present

## 2017-11-05 DIAGNOSIS — I350 Nonrheumatic aortic (valve) stenosis: Secondary | ICD-10-CM

## 2017-11-05 DIAGNOSIS — I25119 Atherosclerotic heart disease of native coronary artery with unspecified angina pectoris: Secondary | ICD-10-CM | POA: Diagnosis not present

## 2017-11-08 DIAGNOSIS — N2581 Secondary hyperparathyroidism of renal origin: Secondary | ICD-10-CM | POA: Diagnosis not present

## 2017-11-08 DIAGNOSIS — M1990 Unspecified osteoarthritis, unspecified site: Secondary | ICD-10-CM | POA: Diagnosis not present

## 2017-11-08 DIAGNOSIS — N189 Chronic kidney disease, unspecified: Secondary | ICD-10-CM | POA: Diagnosis not present

## 2017-11-08 DIAGNOSIS — I129 Hypertensive chronic kidney disease with stage 1 through stage 4 chronic kidney disease, or unspecified chronic kidney disease: Secondary | ICD-10-CM | POA: Diagnosis not present

## 2017-11-08 DIAGNOSIS — N400 Enlarged prostate without lower urinary tract symptoms: Secondary | ICD-10-CM | POA: Diagnosis not present

## 2017-11-08 DIAGNOSIS — D631 Anemia in chronic kidney disease: Secondary | ICD-10-CM | POA: Diagnosis not present

## 2017-11-08 DIAGNOSIS — N183 Chronic kidney disease, stage 3 (moderate): Secondary | ICD-10-CM | POA: Diagnosis not present

## 2017-11-08 LAB — URINALYSIS, DIPSTICK ONLY
Bilirubin: NEGATIVE
Glucose: NEGATIVE
Ketone: NEGATIVE
NITRITE: NEGATIVE
Occult Blood: NEGATIVE
PH: 5.5
Protein: NEGATIVE
Specific Gravity: 1.02
UUROB: 0.2
WBC: NEGATIVE

## 2017-11-08 LAB — CBC AND DIFFERENTIAL
HCT: 44 (ref 41–53)
HEMOGLOBIN: 15.1 (ref 13.5–17.5)
WBC: 9.1

## 2017-11-08 LAB — BASIC METABOLIC PANEL
BUN: 39 — AB (ref 4–21)
Creatinine: 2 — AB (ref 0.6–1.3)
GLUCOSE: 106
Potassium: 4.2 (ref 3.4–5.3)
Sodium: 138 (ref 137–147)

## 2017-11-12 ENCOUNTER — Ambulatory Visit (INDEPENDENT_AMBULATORY_CARE_PROVIDER_SITE_OTHER): Payer: Medicare Other | Admitting: Cardiology

## 2017-11-12 ENCOUNTER — Encounter: Payer: Self-pay | Admitting: Cardiology

## 2017-11-12 VITALS — BP 114/74 | HR 77 | Ht 75.0 in | Wt 243.0 lb

## 2017-11-12 DIAGNOSIS — I350 Nonrheumatic aortic (valve) stenosis: Secondary | ICD-10-CM | POA: Diagnosis not present

## 2017-11-12 DIAGNOSIS — I77810 Thoracic aortic ectasia: Secondary | ICD-10-CM

## 2017-11-12 DIAGNOSIS — I25709 Atherosclerosis of coronary artery bypass graft(s), unspecified, with unspecified angina pectoris: Secondary | ICD-10-CM

## 2017-11-12 DIAGNOSIS — Z45018 Encounter for adjustment and management of other part of cardiac pacemaker: Secondary | ICD-10-CM

## 2017-11-12 DIAGNOSIS — I495 Sick sinus syndrome: Secondary | ICD-10-CM | POA: Diagnosis not present

## 2017-11-12 MED ORDER — METOPROLOL TARTRATE 25 MG PO TABS: 25.0000 mg | ORAL_TABLET | Freq: Two times a day (BID) | ORAL | 2 refills | Status: DC

## 2017-11-12 MED ORDER — METOPROLOL TARTRATE 25 MG PO TABS: 25.0000 mg | ORAL_TABLET | Freq: Two times a day (BID) | ORAL | 0 refills | Status: DC

## 2017-11-12 NOTE — Progress Notes (Signed)
Electrophysiology Office Note   Date:  11/12/2017   ID:  [name], DOB 1934/07/09, MRN [id number]  PCP:  Marin Olp, MD  Cardiologist:  Marlou Porch Primary Electrophysiologist:  Jadarius Commons Meredith Leeds, MD    Chief Complaint  Patient presents with  . Pacemaker Check    Sick sinus syndrome/Sinus bradycardia/90 days post implant     History of Present Illness: [name] is a 84 y.o. male who is being seen today for the evaluation of sick sinus syndrome at the request of [name]. Presenting today for electrophysiology evaluation.  He has a history of coronary disease status post PCI with drug-eluting stents to the RCA and LAD 11/20/16, CKD stage IV, kidney stones, hypertension, hyperlipidemia.  He was out of town January 2019 when he started to feel the room spinning.  EMS was called.  His heart rate was found to be 24 and a Medtronic dual-chamber pacemaker was implanted for possible sick sinus syndrome.    Today, he denies symptoms of palpitations, chest pain, shortness of breath, orthopnea, PND, lower extremity edema, claudication, dizziness, presyncope, syncope, bleeding, or neurologic sequela. The patient is tolerating medications without difficulties.    Past Medical History:  Diagnosis Date  . Arthritis    "back, knees" (11/21/2016)  . Bladder stones   . CAD (coronary artery disease)    11/20/16 PCI with DES--> LAD/RCA  . Chronic lower back pain   . CKD (chronic kidney disease), stage IV ([place])    [name] 11/08/2016  . History of gout   . History of kidney stones 12/02/2008  . Hyperlipidemia   . Hypertension   . Myocardial infarction [facility])    "silent" (11/21/2016)  . PSA, INCREASED 12/05/2009  . UTI (urinary tract infection)    Past Surgical History:  Procedure Laterality Date  . CARDIAC CATHETERIZATION  11/14/2016  . CATARACT EXTRACTION W/ INTRAOCULAR LENS  IMPLANT, BILATERAL    . CORONARY ANGIOPLASTY WITH STENT PLACEMENT  11/21/2016   "2 stents"   .  CORONARY ATHERECTOMY N/A 11/21/2016   Procedure: Coronary Atherectomy;  Surgeon: Leonie Man, MD;  Location: [place] [facility];  Service: Cardiovascular;  Laterality: N/A;  RCA and LAD  . CORONARY STENT INTERVENTION N/A 11/21/2016   Procedure: Coronary Stent Intervention;  Surgeon: Leonie Man, MD;  Location: [place] [facility];  Service: Cardiovascular;  Laterality: N/A;  RCA and LAD  . CYSTOSCOPY W/ STONE MANIPULATION    . JOINT REPLACEMENT    . KNEE ARTHROSCOPY Left   . LAPAROSCOPIC CHOLECYSTECTOMY    . LEFT HEART CATH N/A 11/21/2016   Procedure: Left Heart Cath;  Surgeon: Leonie Man, MD;  Location: [place] [facility];  Service: Cardiovascular;  Laterality: N/A;  . LITHOTRIPSY    . PILONIDAL CYST EXCISION  56  . RIGHT/LEFT HEART CATH AND CORONARY ANGIOGRAPHY N/A 11/14/2016   Procedure: Right/Left Heart Cath and Coronary Angiography;  Surgeon: Leonie Man, MD;  Location: [facility];  Service: Cardiovascular;  Laterality: N/A;  . TEMPORARY PACEMAKER N/A 11/21/2016   Procedure: Temporary Pacemaker;  Surgeon: Leonie Man, MD;  Location: [facility];  Service: Cardiovascular;  Laterality: N/A;  . TONSILLECTOMY    . TOTAL KNEE ARTHROPLASTY Left 09/22/2012   Procedure: TOTAL KNEE ARTHROPLASTY;  Surgeon: Lorn Junes, MD;  Location: [facility];  Service: Orthopedics;  Laterality: Left;     Current Outpatient Medications  Medication Sig Dispense Refill  . allopurinol (ZYLOPRIM) 300 MG  tablet TAKE 1 TABLET DAILY 90 tablet 3  . apixaban (ELIQUIS) 2.5 MG TABS tablet Take 1 tablet (2.5 mg total) by mouth 2 (two) times daily. 180 tablet 3  . atorvastatin (LIPITOR) 40 MG tablet Take 40 mg by mouth at bedtime.    . clopidogrel (PLAVIX) 75 MG tablet Take 1 tablet (75 mg total) by mouth daily. 90 tablet 2  . diphenhydramine-acetaminophen (TYLENOL PM) 25-500 MG TABS tablet Take 1 tablet by mouth at bedtime as needed (sleep).    . losartan (COZAAR) 25 MG tablet Take 1 tablet  (25 mg total) by mouth daily. 30 tablet 0  . nitroGLYCERIN (NITROSTAT) 0.4 MG SL tablet Place 1 tablet (0.4 mg total) under the tongue every 5 (five) minutes as needed for chest pain. 25 tablet 11  . Tamsulosin HCl (FLOMAX) 0.4 MG CAPS Take 0.4 mg by mouth at bedtime.      No current facility-administered medications for this visit.     Allergies:   Epinephrine; Lisinopril; and Penicillins   Social History:  The patient  reports that he has never smoked. He has never used smokeless tobacco. He reports that he drinks alcohol. He reports that he does not use drugs.   Family History:  The patient's family history includes Breast cancer in his mother; Heart attack in his father.    ROS:  Please see the history of present illness.   Otherwise, review of systems is positive for swelling, shortness of breath.   All other systems are reviewed and negative.    PHYSICAL EXAM: VS:  BP 114/74   Pulse 77   Ht 6\' 3"  (1.905 m)   Wt 243 lb (110.2 kg)   BMI 30.37 kg/m  , BMI Body mass index is 30.37 kg/m. GEN: Well nourished, well developed, in no acute distress  HEENT: normal  Neck: no JVD, carotid bruits, or masses Cardiac: RRR; no murmurs, rubs, or gallops,no edema  Respiratory:  clear to auscultation bilaterally, normal work of breathing GI: soft, nontender, nondistended, + BS MS: no deformity or atrophy  Skin: warm and dry,  device pocket is well healed Neuro:  Strength and sensation are intact Psych: euthymic mood, full affect  EKG:  EKG is ordered today. Personal review of the ekg ordered shows atrial paced, incomplete right bundle branch block, rate 77, LAFB  Device interrogation is reviewed today in detail.  See PaceArt for details.   Recent Labs: 11/22/2016: Hemoglobin 15.0; Platelets 135 12/17/2016: BUN 32; Creatinine, Ser 1.57; Potassium 4.6; Sodium 137    Lipid Panel     Component Value Date/Time   CHOL 145 01/19/2016 0902   TRIG 96.0 01/19/2016 0902   HDL 40.10  01/19/2016 0902   CHOLHDL 4 01/19/2016 0902   VLDL 19.2 01/19/2016 0902   LDLCALC 86 01/19/2016 0902   LDLDIRECT 79.0 12/08/2014 0905     Wt Readings from Last 3 Encounters:  11/12/17 243 lb (110.2 kg)  10/24/17 244 lb 6.4 oz (110.9 kg)  10/04/17 245 lb 9.6 oz (111.4 kg)      Other studies Reviewed: Additional studies/ records that were reviewed today include: TTE 11/05/17  Review of the above records today demonstrates:  - Left ventricle: The cavity size was normal. Systolic function was   normal. The estimated ejection fraction was in the range of 55%   to 60%. Wall motion was normal; there were no regional wall   motion abnormalities. Doppler parameters are consistent with   abnormal left ventricular relaxation (grade 1  diastolic   dysfunction). - Aortic valve: Noncoronary cusp mobility was severely restricted.   There was mild stenosis. There was mild to moderate   regurgitation. Valve area (VTI): 1.54 cm^2. Valve area (Vmax):   1.28 cm^2. Valve area (Vmean): 1.27 cm^2. - Mitral valve: Calcified annulus. Mildly thickened leaflets . - Left atrium: The atrium was moderately dilated. - Right ventricle: The cavity size was moderately dilated. Wall   thickness was normal. - Pulmonary arteries: PA peak pressure: 32 mm Hg (S).   ASSESSMENT AND PLAN:  1.  Sick sinus syndrome: Status post Medtronic dual-chamber pacemaker implanted 08/2017.  Functioning appropriately today.  No changes.  2.  Coronary artery disease: Status post RCA and LAD stents.  3.  Dilated aortic root: Monitoring per primary cardiology  4.  Mild aortic stenosis: No symptoms at this point.  5.  Nonsustained VT: Found on device interrogation.  He is not on a beta-blocker currently.  We Gessica Jawad start him on metoprolol 25 mg twice a day.  Current medicines are reviewed at length with the patient today.   The patient does not have concerns regarding his medicines.  The following changes were made today: Heart  metoprolol  Labs/ tests ordered today include:  Orders Placed This Encounter  Procedures  . EKG 12-Lead     Disposition:   FU with Bellarae Lizer 9 months  Signed, Wellington Winegarden Meredith Leeds, MD  11/12/2017 11:10 AM     [facility] [address] [place] [address] [phone] (office) [phone] (fax)

## 2017-11-12 NOTE — Patient Instructions (Addendum)
Medication Instructions:  Your physician has recommended you make the following change in your medication:  1. START Metoprolol Tartrate 25 mg twice daily  *If you need a refill on your cardiac medications before your next appointment, please call your pharmacy*  Labwork: None ordered  Testing/Procedures: None ordered  Follow-Up: Remote monitoring is used to monitor your Pacemaker or ICD from home. This monitoring reduces the number of office visits required to check your device to one time per year. It allows [place] to keep an eye on the functioning of your device to ensure it is working properly. You are scheduled for a device check from home on 02/11/2018. You may send your transmission at any time that day. If you have a wireless device, the transmission will be sent automatically. After your physician reviews your transmission, you will receive a postcard with your next transmission date.  Your physician wants you to follow-up in: 9 months with Dr. Curt Bears.  You will receive a reminder letter in the mail two months in advance. If you don't receive a letter, please call our office to schedule the follow-up appointment.  Thank you for choosing [facility]!!   Trinidad Curet, RN [phone]  Any Other Special Instructions Will Be Listed Below (If Applicable).

## 2017-11-19 ENCOUNTER — Encounter: Payer: Self-pay | Admitting: Family Medicine

## 2017-12-01 ENCOUNTER — Other Ambulatory Visit: Payer: Self-pay | Admitting: Family Medicine

## 2017-12-05 ENCOUNTER — Other Ambulatory Visit: Payer: Self-pay | Admitting: Family Medicine

## 2017-12-05 ENCOUNTER — Telehealth: Payer: Self-pay | Admitting: Family Medicine

## 2017-12-05 DIAGNOSIS — M1711 Unilateral primary osteoarthritis, right knee: Secondary | ICD-10-CM | POA: Diagnosis not present

## 2017-12-05 DIAGNOSIS — M545 Low back pain: Secondary | ICD-10-CM | POA: Diagnosis not present

## 2017-12-05 NOTE — Telephone Encounter (Signed)
Copied from [place] [phone]. Topic: General - Other >> Dec 05, 2017  1:38 PM Darl Householder, RMA wrote: Reason for CRM: Medication refill request for clopidogrel (PLAVIX) 75 MG tablet to be sent to [facility], pt is completely out of medication

## 2017-12-06 MED ORDER — CLOPIDOGREL BISULFATE 75 MG PO TABS: 75.0000 mg | ORAL_TABLET | Freq: Every day | ORAL | 0 refills | Status: DC

## 2017-12-06 NOTE — Telephone Encounter (Signed)
Requested a 30 day refill on Clopidogrel until order arrives from [facility]. Last refill 12/05/17; # 90; RF x 2 to mail order Last OV 10/04/17 PCP: Dr. Yong Channel Pharmacy:  [facility]   Refill completed per pt. request

## 2017-12-09 ENCOUNTER — Encounter: Payer: Medicare Other | Admitting: Cardiology

## 2017-12-11 NOTE — Telephone Encounter (Signed)
Patient stated that [facility] didn't receive his Plavis 75mg  tablet prescription.  Please send ASAP.

## 2017-12-13 ENCOUNTER — Encounter: Payer: Medicare Other | Admitting: Cardiology

## 2017-12-15 ENCOUNTER — Other Ambulatory Visit: Payer: Self-pay | Admitting: Family Medicine

## 2017-12-16 IMAGING — CT CT ANGIO CHEST
2 of 8 series · 18 of 46 positions shown · IV contrast (ISOVUE 370)
Comparison: Chest radiographs 10/18/2016

ADDENDUM:
Critical Value/emergent results were called by telephone at the time
of interpretation on 12/17/2016 at [DATE] to Dr. JOSIANNE MARCO GILFORD ,
who verbally acknowledged these results.
CLINICAL DATA: Shortness of breath x1 month

EXAM:
CT ANGIOGRAPHY CHEST WITH CONTRAST
TECHNIQUE: Multidetector CT imaging of the chest was performed using the
standard protocol during bolus administration of intravenous
contrast. Multiplanar CT image reconstructions and MIPs were
obtained to evaluate the vascular anatomy.
CONTRAST:  72 mL Isovue 370 IV

[Series 5: thins · axial · 0.80mm/px · z∈[-328,-69]mm · 15 of 293 slices shown]
[im 17/293  lung]
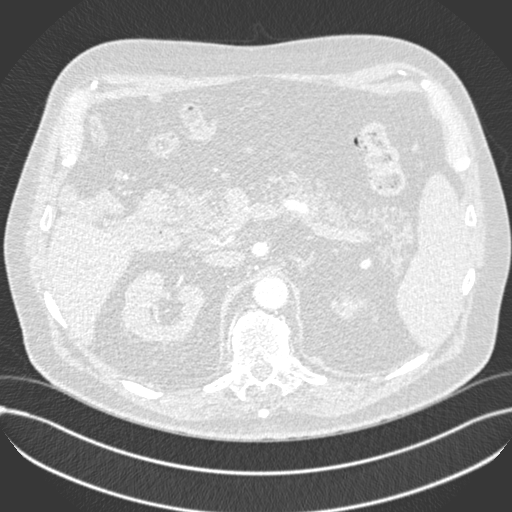
[im 33/293  soft-tissue]
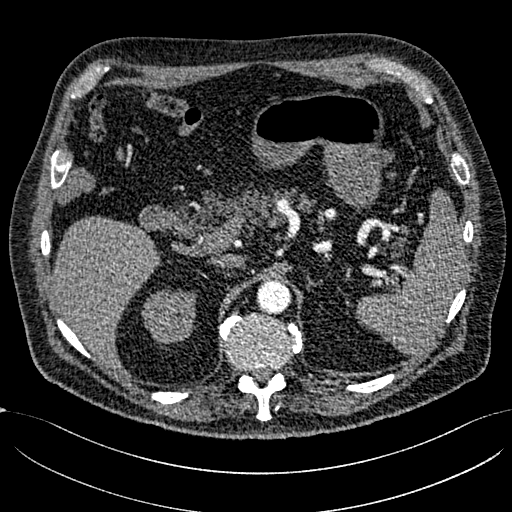
[im 49/293  lung]
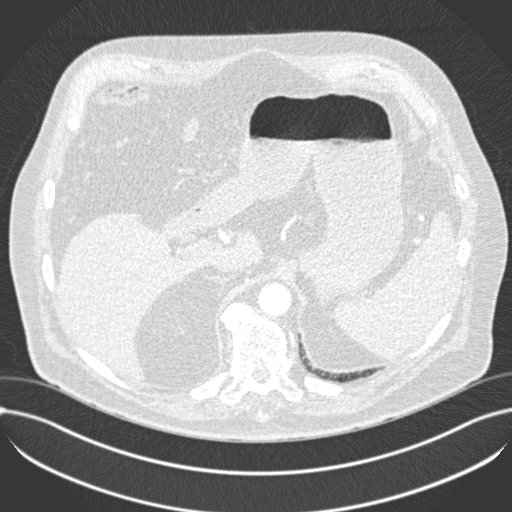
[im 65/293  soft-tissue]
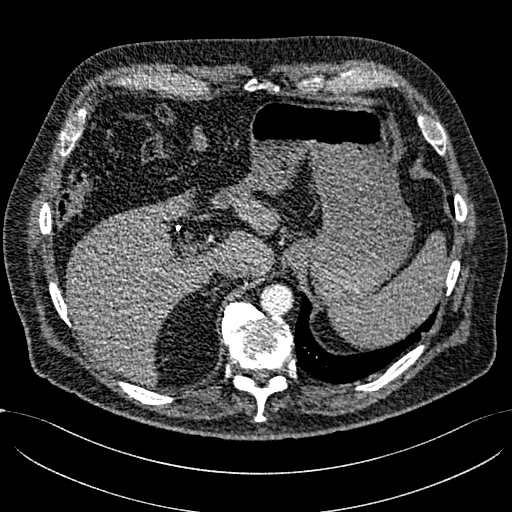
[im 98/293  lung]
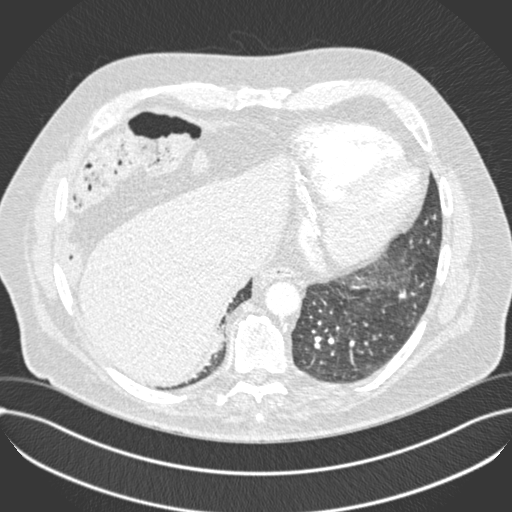
[im 114/293  soft-tissue]
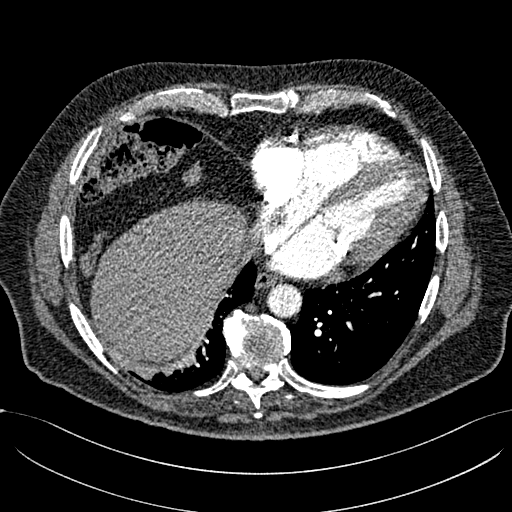
[im 130/293  lung]
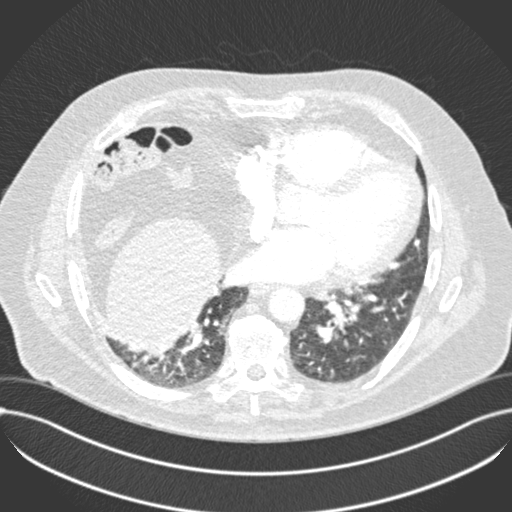
[im 147/293  soft-tissue]
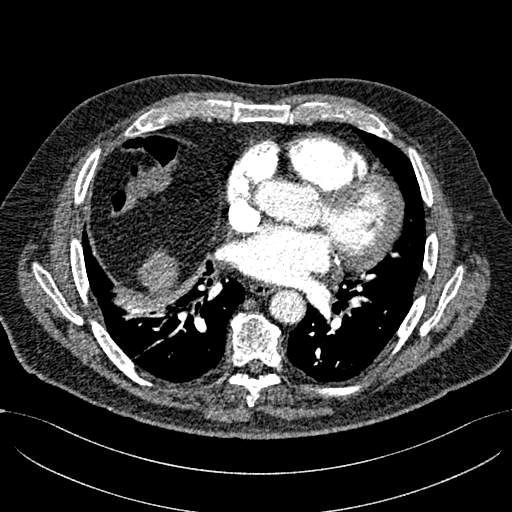
[im 163/293  lung]
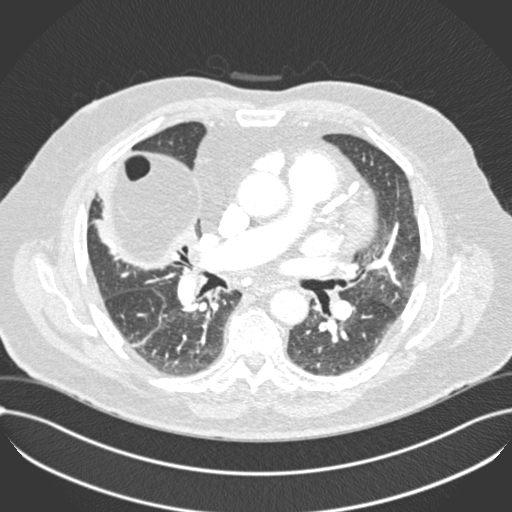
[im 179/293  soft-tissue]
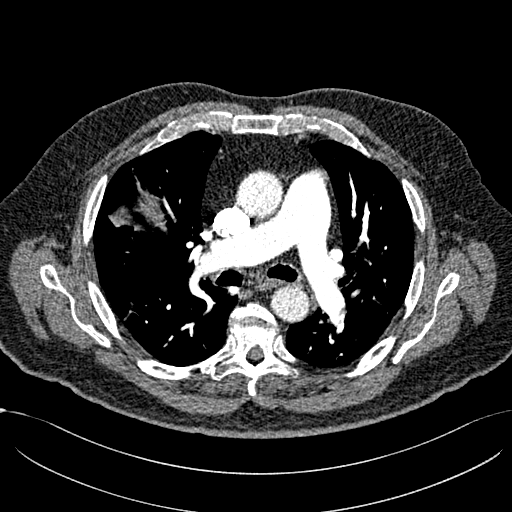
[im 195/293  lung]
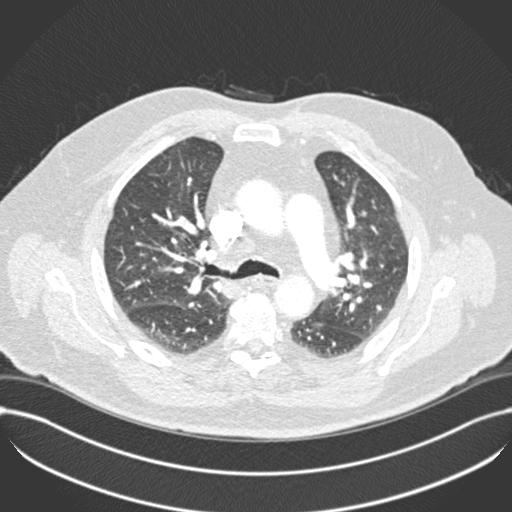
[im 228/293  soft-tissue]
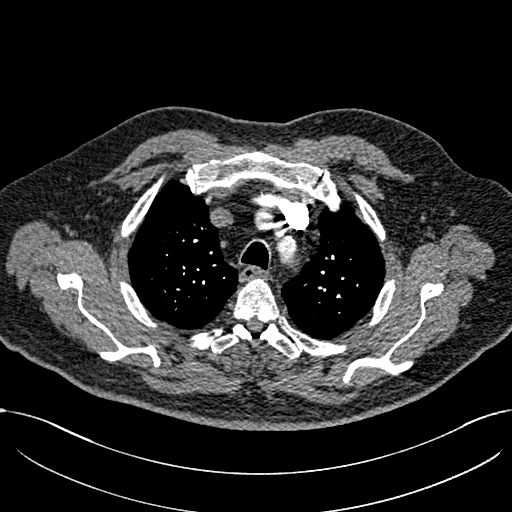
[im 244/293  lung]
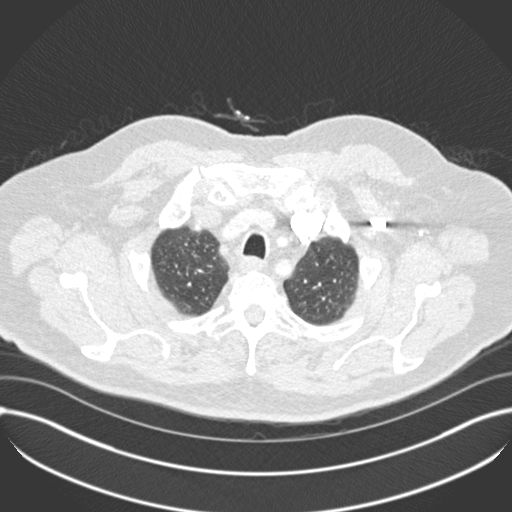
[im 260/293  soft-tissue]
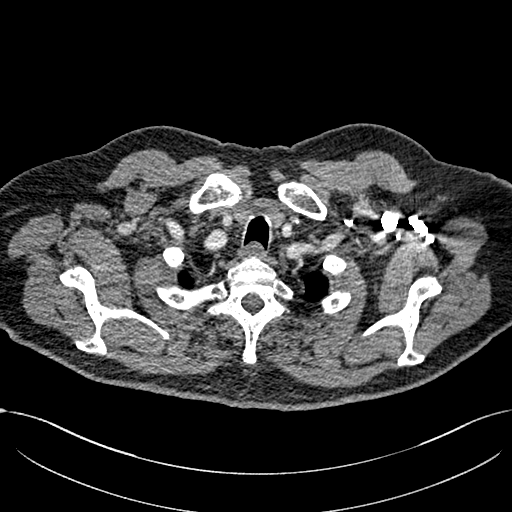
[im 276/293  lung]
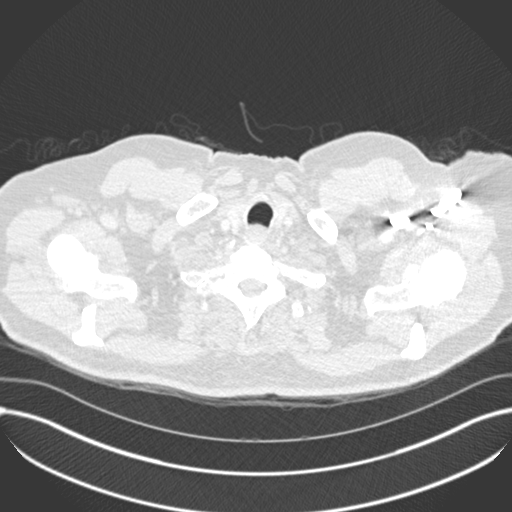

[Series 7: coronal mpr · coronal · 0.59mm/px · 3 of 141 slices shown]
[im 36/141  soft-tissue]
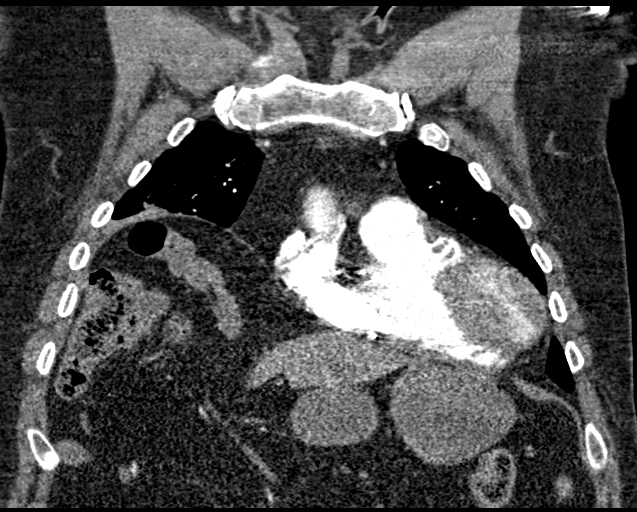
[im 71/141  soft-tissue]
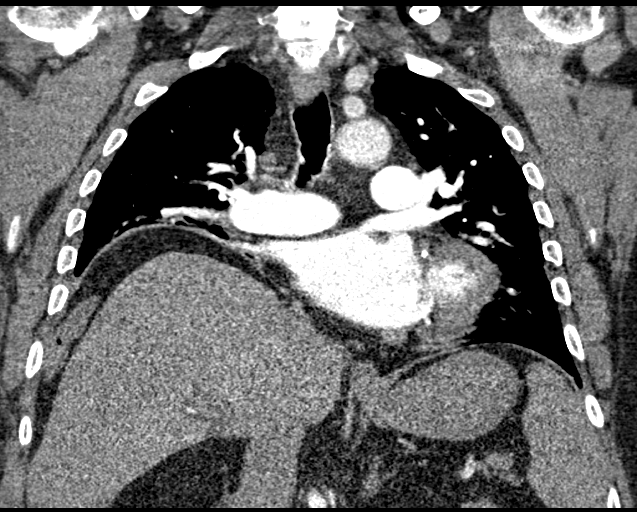
[im 106/141  soft-tissue]
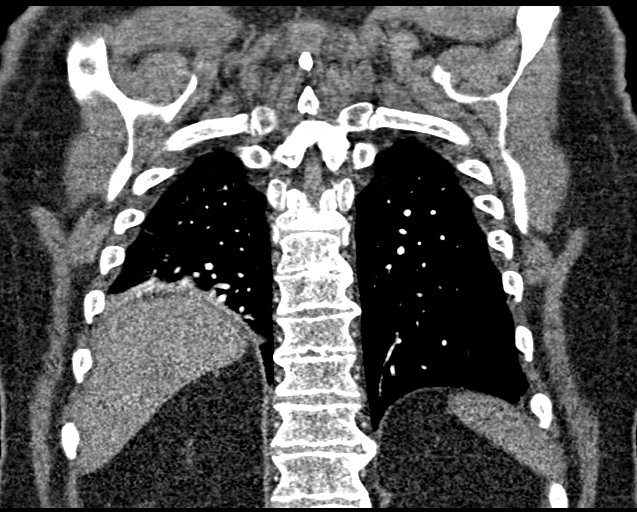

[18 of 46 positions shown; findings below may reference images not displayed]

FINDINGS: Cardiovascular: Satisfactory opacification of the bilateral
pulmonary arteries to the segmental level.

Segmental/ subsegmental pulmonary emboli within branches of the left
lower lobe pulmonary artery (series 5/ image 131). Overall clot
burden is small.

Normal RV to LV ratio.  No evidence of right heart strain.

Mild cardiomegaly.  No pericardial effusion.

Three vessel coronary atherosclerosis.  Coronary stents.

Mild atherosclerotic calcifications aortic arch.

Mediastinum/Nodes: No suspicious mediastinal lymphadenopathy.

Visualized thyroid is unremarkable.

Lungs/Pleura: No suspicious pulmonary nodules.

No focal consolidation.

Eventration of the hemidiaphragm with associated right basilar
atelectasis.

No pleural effusion or pneumothorax.

Upper Abdomen: Visualized upper abdomen is unremarkable.

Musculoskeletal: Degenerative changes of the visualized
thoracolumbar spine.

Review of the MIP images confirms the above findings.
IMPRESSION: Segmental/subsegmental pulmonary emboli within branches of the left
lower lobe pulmonary artery. Overall clot burden is small.

## 2017-12-20 ENCOUNTER — Other Ambulatory Visit: Payer: Self-pay | Admitting: Family Medicine

## 2017-12-31 ENCOUNTER — Other Ambulatory Visit: Payer: Self-pay | Admitting: Cardiology

## 2017-12-31 MED ORDER — METOPROLOL TARTRATE 25 MG PO TABS: 25.0000 mg | ORAL_TABLET | Freq: Two times a day (BID) | ORAL | 2 refills | Status: DC

## 2018-01-01 DIAGNOSIS — Z7902 Long term (current) use of antithrombotics/antiplatelets: Secondary | ICD-10-CM | POA: Diagnosis not present

## 2018-01-01 DIAGNOSIS — Z950 Presence of cardiac pacemaker: Secondary | ICD-10-CM | POA: Diagnosis not present

## 2018-01-01 DIAGNOSIS — R42 Dizziness and giddiness: Secondary | ICD-10-CM | POA: Diagnosis not present

## 2018-01-01 DIAGNOSIS — Z7901 Long term (current) use of anticoagulants: Secondary | ICD-10-CM | POA: Diagnosis not present

## 2018-01-01 DIAGNOSIS — Z86718 Personal history of other venous thrombosis and embolism: Secondary | ICD-10-CM | POA: Diagnosis not present

## 2018-01-01 DIAGNOSIS — E785 Hyperlipidemia, unspecified: Secondary | ICD-10-CM | POA: Diagnosis not present

## 2018-01-01 DIAGNOSIS — I2510 Atherosclerotic heart disease of native coronary artery without angina pectoris: Secondary | ICD-10-CM | POA: Diagnosis not present

## 2018-01-01 DIAGNOSIS — I129 Hypertensive chronic kidney disease with stage 1 through stage 4 chronic kidney disease, or unspecified chronic kidney disease: Secondary | ICD-10-CM | POA: Diagnosis not present

## 2018-01-01 DIAGNOSIS — N189 Chronic kidney disease, unspecified: Secondary | ICD-10-CM | POA: Diagnosis not present

## 2018-01-01 DIAGNOSIS — Z880 Allergy status to penicillin: Secondary | ICD-10-CM | POA: Diagnosis not present

## 2018-01-02 ENCOUNTER — Telehealth: Payer: Self-pay | Admitting: Family Medicine

## 2018-01-02 ENCOUNTER — Encounter: Payer: Self-pay | Admitting: *Deleted

## 2018-01-02 NOTE — Telephone Encounter (Signed)
Pt is at [facility]. Had episodes of vertigo last 2 nights only when turning onto left side when in bed.  Pt has pacer and states he went to an UC.  Per pt's reports "All ok, my pacemaker, heart, blood work was all good." States determined BPPV ; "They said crystals in my left ear shifted causing vertigo". States gave him "medication for car sickness that knocks me out."  Pt is requesting order for P.T. at his location to do Epley Maneuver. States he has spoken with them and they will need an order from PCP. Pt denies any other symptoms. States he has never had the maneuver done but "My wife has it done periodically."  Please advise.  If appropriate, info as follows: [facility]  Fax #    [phone]   Ph #  same as Fax.  Pt's CB #  [phone]

## 2018-01-02 NOTE — Telephone Encounter (Signed)
This encounter was created in error - please disregard.

## 2018-01-02 NOTE — Telephone Encounter (Signed)
See note

## 2018-01-02 NOTE — Telephone Encounter (Signed)
Order was faxed to [facility] for Vestibular Rehab. Fax number is [phone]

## 2018-01-03 DIAGNOSIS — R42 Dizziness and giddiness: Secondary | ICD-10-CM | POA: Diagnosis not present

## 2018-01-03 DIAGNOSIS — R262 Difficulty in walking, not elsewhere classified: Secondary | ICD-10-CM | POA: Diagnosis not present

## 2018-01-13 ENCOUNTER — Encounter: Payer: Self-pay | Admitting: Family Medicine

## 2018-01-14 ENCOUNTER — Encounter: Payer: Self-pay | Admitting: Family Medicine

## 2018-01-14 ENCOUNTER — Other Ambulatory Visit: Payer: Self-pay

## 2018-01-14 MED ORDER — CLOPIDOGREL BISULFATE 75 MG PO TABS: 75.0000 mg | ORAL_TABLET | Freq: Every day | ORAL | 2 refills | Status: DC

## 2018-01-23 ENCOUNTER — Encounter: Payer: Self-pay | Admitting: Family Medicine

## 2018-01-27 DIAGNOSIS — R262 Difficulty in walking, not elsewhere classified: Secondary | ICD-10-CM | POA: Diagnosis not present

## 2018-01-27 DIAGNOSIS — R42 Dizziness and giddiness: Secondary | ICD-10-CM | POA: Diagnosis not present

## 2018-02-06 ENCOUNTER — Ambulatory Visit: Payer: Medicare Other | Admitting: Family Medicine

## 2018-02-11 ENCOUNTER — Ambulatory Visit (INDEPENDENT_AMBULATORY_CARE_PROVIDER_SITE_OTHER): Payer: Medicare Other | Admitting: *Deleted

## 2018-02-11 DIAGNOSIS — I495 Sick sinus syndrome: Secondary | ICD-10-CM

## 2018-02-11 NOTE — Progress Notes (Signed)
Remote pacemaker transmission.   

## 2018-02-20 LAB — CUP PACEART REMOTE DEVICE CHECK
Battery Remaining Longevity: 137 mo
Brady Statistic AP VP Percent: 3.11 %
Brady Statistic AP VS Percent: 90.7 %
Brady Statistic AS VS Percent: 6.06 %
Brady Statistic RV Percent Paced: 3.23 %
Implantable Lead Location: 753859
Implantable Lead Model: 5076
Implantable Lead Model: 5076
Lead Channel Impedance Value: 323 Ohm
Lead Channel Impedance Value: 399 Ohm
Lead Channel Impedance Value: 456 Ohm
Lead Channel Pacing Threshold Amplitude: 0.75 V
Lead Channel Pacing Threshold Pulse Width: 0.4 ms
Lead Channel Sensing Intrinsic Amplitude: 0.625 mV
Lead Channel Sensing Intrinsic Amplitude: 8.25 mV
Lead Channel Sensing Intrinsic Amplitude: 8.25 mV
Lead Channel Setting Pacing Amplitude: 2 V
Lead Channel Setting Pacing Amplitude: 2.5 V
Lead Channel Setting Pacing Pulse Width: 0.4 ms
Lead Channel Setting Sensing Sensitivity: 1.2 mV
MDC IDC LEAD IMPLANT DT: 20190116
MDC IDC LEAD IMPLANT DT: 20190116
MDC IDC LEAD LOCATION: 753860
MDC IDC MSMT BATTERY VOLTAGE: 3.09 V
MDC IDC MSMT LEADCHNL RA IMPEDANCE VALUE: 304 Ohm
MDC IDC MSMT LEADCHNL RA PACING THRESHOLD AMPLITUDE: 1.25 V
MDC IDC MSMT LEADCHNL RA PACING THRESHOLD PULSEWIDTH: 0.4 ms
MDC IDC MSMT LEADCHNL RA SENSING INTR AMPL: 0.625 mV
MDC IDC PG IMPLANT DT: 20190116
MDC IDC SESS DTM: 20190702040044
MDC IDC STAT BRADY AS VP PERCENT: 0.12 %
MDC IDC STAT BRADY RA PERCENT PACED: 94.19 %

## 2018-03-06 ENCOUNTER — Encounter: Payer: Self-pay | Admitting: Cardiology

## 2018-03-21 DIAGNOSIS — N5201 Erectile dysfunction due to arterial insufficiency: Secondary | ICD-10-CM | POA: Diagnosis not present

## 2018-03-21 DIAGNOSIS — R972 Elevated prostate specific antigen [PSA]: Secondary | ICD-10-CM | POA: Diagnosis not present

## 2018-03-21 DIAGNOSIS — N401 Enlarged prostate with lower urinary tract symptoms: Secondary | ICD-10-CM | POA: Diagnosis not present

## 2018-03-21 DIAGNOSIS — R3912 Poor urinary stream: Secondary | ICD-10-CM | POA: Diagnosis not present

## 2018-04-01 ENCOUNTER — Other Ambulatory Visit: Payer: Self-pay | Admitting: Family Medicine

## 2018-04-01 ENCOUNTER — Telehealth: Payer: Self-pay | Admitting: Family Medicine

## 2018-04-01 MED ORDER — CLOPIDOGREL BISULFATE 75 MG PO TABS: 75.0000 mg | ORAL_TABLET | Freq: Every day | ORAL | 0 refills | Status: DC

## 2018-04-01 NOTE — Telephone Encounter (Signed)
Copied from [place] [id number]. Topic: Quick Communication - Rx Refill/Question >> Apr 01, 2018  2:29 PM Neva Seat wrote: clopidogrel (PLAVIX) 75 MG tablet - needing 30 day supply until [facility] can fill. Pt is on vacation.  Please call into     [facility] in [place]

## 2018-04-01 NOTE — Telephone Encounter (Signed)
See note

## 2018-04-01 NOTE — Addendum Note (Signed)
Addended by: Marian Sorrow on: 04/01/2018 05:37 PM   Modules accepted: Orders

## 2018-04-01 NOTE — Telephone Encounter (Addendum)
  Plavix 75mg  refill Last Refill:6/4/1 # 90 Last OV: 12/14/17 PCP: [facility]

## 2018-04-01 NOTE — Telephone Encounter (Signed)
Spoke to pt told him Rx was sent to pharmacy as requested. Pt said yes he already got a call from the pharmacy. Told him okay good.

## 2018-04-07 DIAGNOSIS — M79671 Pain in right foot: Secondary | ICD-10-CM | POA: Diagnosis not present

## 2018-04-07 DIAGNOSIS — M109 Gout, unspecified: Secondary | ICD-10-CM | POA: Diagnosis not present

## 2018-04-08 ENCOUNTER — Other Ambulatory Visit: Payer: Self-pay | Admitting: *Deleted

## 2018-04-08 NOTE — Progress Notes (Signed)
Received drug utilization clarification request from [facility].  Per Dr Marlou Porch - d/c Clopidegrel - has been over 1 year since DES.  Pt to remain on Eliquis.  Paperwork completed and taken to MR to be faxed.  Left message for patient to c/b to discuss.  Pt aware to discontinue Plavix.  He had no further questions

## 2018-04-09 NOTE — Telephone Encounter (Signed)
OK to continue Eliquis low dose for DVT and PE prevention.  Is on Plavix as well.  This does impart an increase bleeding risk. Bleeding risk may be decreased slightly by stopping plavix and changing to ASA 81 PO QD. Has CAD (prior arthrectomy) Let's try this.   [name], MD

## 2018-04-15 DIAGNOSIS — R600 Localized edema: Secondary | ICD-10-CM | POA: Diagnosis not present

## 2018-04-15 DIAGNOSIS — B353 Tinea pedis: Secondary | ICD-10-CM | POA: Diagnosis not present

## 2018-04-15 DIAGNOSIS — L03119 Cellulitis of unspecified part of limb: Secondary | ICD-10-CM | POA: Diagnosis not present

## 2018-04-17 ENCOUNTER — Encounter: Payer: Self-pay | Admitting: Family Medicine

## 2018-04-17 ENCOUNTER — Ambulatory Visit (INDEPENDENT_AMBULATORY_CARE_PROVIDER_SITE_OTHER): Payer: Medicare Other | Admitting: Family Medicine

## 2018-04-17 VITALS — BP 124/74 | HR 65 | Temp 97.7°F | Ht 75.0 in | Wt 248.4 lb

## 2018-04-17 DIAGNOSIS — L03115 Cellulitis of right lower limb: Secondary | ICD-10-CM | POA: Diagnosis not present

## 2018-04-17 MED ORDER — DOXYCYCLINE HYCLATE 100 MG PO TABS: 100.0000 mg | ORAL_TABLET | Freq: Two times a day (BID) | ORAL | 0 refills | Status: DC

## 2018-04-17 NOTE — Progress Notes (Signed)
   Subjective:  [name] is a 84 y.o. male who presents today for same-day appointment with a chief complaint of foot swelling.   HPI:  Foot Swelling, Acute problem Started about a week ago.  Located on right foot.  He went to urgent care and was diagnosed with cellulitis.  He was started on Bactrim and Lasix 2 days ago without significant improvement in his symptoms.  He did have some pain to his right foot which is since subsided.  No fevers or chills.  He has a cut on the outer aspect of his right foot that is due to dry skin.  No other obvious alleviating or aggravating factors.   ROS: Per HPI  PMH: He reports that he has never smoked. He has never used smokeless tobacco. He reports that he drinks alcohol. He reports that he does not use drugs.  Objective:  Physical Exam: BP 124/74 (BP Location: Left Arm, Patient Position: Sitting, Cuff Size: Normal)   Pulse 65   Temp 97.7 F (36.5 C) (Oral)   Ht 6\' 3"  (1.905 m)   Wt 248 lb 6.4 oz (112.7 kg)   SpO2 95%   BMI 31.05 kg/m   Gen: NAD, resting comfortably MSK: -Right foot: Grossly edematous and erythematous.  Dry and cracked skin noted throughout. Cracked skin with open area noted on lateral aspect of foot.  Surrounding erythema.  No apparent drainage.  Normal distal cap refill. -Right calf:  No calf tenderness.  Homans sign negative.  Assessment/Plan:  Right foot cellulitis Likely secondary to dry and cracked skin with bacterial superinfection.  No signs of systemic infection.  Vital signs are stable.  He has had no significant improvement with Bactrim.  We will switch his antibiotic over to doxycycline today.  He is chronically anticoagulated on Eliquis-very low suspicion for DVT at this point.  Discussed reasons to return to care including development of fevers, chills, body aches, or worsening pain/redness.  [name]. Jerline Pain, MD 04/17/2018 11:26 AM

## 2018-04-17 NOTE — Patient Instructions (Signed)
It was very nice to see you today!  Please change the bactrim to doxycycline.  Let me know if your symptoms do not improve over the next few days.  Please let me know if you have any fevers, chills, body aches, or any other worsening symptoms.   Take care, Dr Jerline Pain

## 2018-05-06 DIAGNOSIS — Z23 Encounter for immunization: Secondary | ICD-10-CM | POA: Diagnosis not present

## 2018-05-13 ENCOUNTER — Ambulatory Visit (INDEPENDENT_AMBULATORY_CARE_PROVIDER_SITE_OTHER): Payer: Medicare Other | Admitting: Cardiology

## 2018-05-13 ENCOUNTER — Telehealth: Payer: Self-pay | Admitting: Cardiology

## 2018-05-13 ENCOUNTER — Ambulatory Visit (INDEPENDENT_AMBULATORY_CARE_PROVIDER_SITE_OTHER): Payer: Medicare Other | Admitting: *Deleted

## 2018-05-13 ENCOUNTER — Encounter: Payer: Self-pay | Admitting: Cardiology

## 2018-05-13 VITALS — BP 124/80 | HR 93 | Ht 75.0 in | Wt 246.8 lb

## 2018-05-13 DIAGNOSIS — I77810 Thoracic aortic ectasia: Secondary | ICD-10-CM

## 2018-05-13 DIAGNOSIS — N184 Chronic kidney disease, stage 4 (severe): Secondary | ICD-10-CM

## 2018-05-13 DIAGNOSIS — I350 Nonrheumatic aortic (valve) stenosis: Secondary | ICD-10-CM | POA: Diagnosis not present

## 2018-05-13 DIAGNOSIS — I495 Sick sinus syndrome: Secondary | ICD-10-CM

## 2018-05-13 DIAGNOSIS — I25709 Atherosclerosis of coronary artery bypass graft(s), unspecified, with unspecified angina pectoris: Secondary | ICD-10-CM

## 2018-05-13 DIAGNOSIS — I25119 Atherosclerotic heart disease of native coronary artery with unspecified angina pectoris: Secondary | ICD-10-CM | POA: Diagnosis not present

## 2018-05-13 NOTE — Progress Notes (Signed)
Cardiology Office Note:    Date:  05/13/2018   ID:  [name], DOB [birth year], MRN [id number]  PCP:  Richard Olp, [name]  Cardiologist:  Richard Furbish, [name]  Electrophysiologist:  Richard Haw, [name]   Referring [name]: Richard Olp, [name]     History of Present Illness:    [name] is a 84 y.o. male here for follow-up, CAD, prior MI, pacemaker SSS.  Cardiac catheterization revealed heavily calcified coronary artery disease and had atherectomy of RCA and LAD.  After this procedure he continued to have some shortness of breath and a PE CT was performed which revealed a subsegmental pulmonary emboli in his left lower lobe.  Small clot burden.  DVTs were noted as well bilaterally with cannulization of clot.  Eliquis was started and aspirin was discontinued at that time.  He was continued on Plavix.  He also travels to [place] quite frequently and has had seen [name] cardiologist at [name].  Did cardiac rehab there.  He experienced dizziness once while in [place] and a pacemaker was placed because of resting heart rate of 24 bpm.  Dr. Curt [name] now follows him as well here.  Wife is [name], retired Conservation officer, historic buildings for 30 years.  Non-smoker, no alcohol.  Right foot swelling at beach, lasix, abx. Celluitis in foot, [name]  Past Medical History:  Diagnosis Date  . Arthritis    "back, knees" (11/21/2016)  . Bladder stones   . CAD (coronary artery disease)    11/20/16 PCI with DES--> LAD/RCA  . Chronic lower back pain   . CKD (chronic kidney disease), stage IV ([place])    [name] 11/08/2016  . History of gout   . History of kidney stones 12/02/2008  . Hyperlipidemia   . Hypertension   . Myocardial infarction [facility])    "silent" (11/21/2016)  . PSA, INCREASED 12/05/2009  . UTI (urinary tract infection)     Past Surgical History:  Procedure Laterality Date  . CARDIAC CATHETERIZATION  11/14/2016  . CATARACT EXTRACTION W/ INTRAOCULAR  LENS  IMPLANT, BILATERAL    . CORONARY ANGIOPLASTY WITH STENT PLACEMENT  11/21/2016   "2 stents"   . CORONARY ATHERECTOMY N/A 11/21/2016   Procedure: Coronary Atherectomy;  Surgeon: Richard Man, [name];  Location: [place] [facility];  Service: Cardiovascular;  Laterality: N/A;  RCA and LAD  . CORONARY STENT INTERVENTION N/A 11/21/2016   Procedure: Coronary Stent Intervention;  Surgeon: Richard Man, [name];  Location: [place] [facility];  Service: Cardiovascular;  Laterality: N/A;  RCA and LAD  . CYSTOSCOPY W/ STONE MANIPULATION    . JOINT REPLACEMENT    . KNEE ARTHROSCOPY Left   . LAPAROSCOPIC CHOLECYSTECTOMY    . LEFT HEART CATH N/A 11/21/2016   Procedure: Left Heart Cath;  Surgeon: Richard Man, [name];  Location: [place];  Service: Cardiovascular;  Laterality: N/A;  . LITHOTRIPSY    . PILONIDAL CYST EXCISION  58  . RIGHT/LEFT HEART CATH AND CORONARY ANGIOGRAPHY N/A 11/14/2016   Procedure: Right/Left Heart Cath and Coronary Angiography;  Surgeon: Richard Man, [name];  Location: [facility];  Service: Cardiovascular;  Laterality: N/A;  . TEMPORARY PACEMAKER N/A 11/21/2016   Procedure: Temporary Pacemaker;  Surgeon: Richard Man, [name];  Location: [place] [facility];  Service: Cardiovascular;  Laterality: N/A;  . TONSILLECTOMY    . TOTAL KNEE ARTHROPLASTY Left 09/22/2012   Procedure: TOTAL KNEE ARTHROPLASTY;  Surgeon: Richard Junes, [name];  Location: [place];  Service: Orthopedics;  Laterality: Left;    Current Medications: Current Meds  Medication Sig  . allopurinol (ZYLOPRIM) 300 MG tablet TAKE 1 TABLET DAILY  . apixaban (ELIQUIS) 2.5 MG TABS tablet Take 1 tablet (2.5 mg total) by mouth 2 (two) times daily.  [name] atorvastatin (LIPITOR) 40 MG tablet TAKE 1 TABLET DAILY  . diphenhydramine-acetaminophen (TYLENOL PM) 25-500 MG TABS tablet Take 1 tablet by mouth at bedtime as needed (sleep).  . metoprolol tartrate (LOPRESSOR) 25 MG tablet Take 1 tablet (25 mg total) by mouth 2 (two)  times daily.  . Tamsulosin HCl (FLOMAX) 0.4 MG CAPS Take 0.4 mg by mouth at bedtime.      Allergies:   Epinephrine; Lisinopril; and Penicillins   Social History   Socioeconomic History  . Marital status: Married    Spouse name: Not on file  . Number of children: Not on file  . Years of education: Not on file  . Highest education level: Not on file  Occupational History  . Not on file  Social Needs  . Financial resource strain: Not on file  . Food insecurity:    Worry: Not on file    Inability: Not on file  . Transportation needs:    Medical: Not on file    Non-medical: Not on file  Tobacco Use  . Smoking status: Never Smoker  . Smokeless tobacco: Never Used  Substance and Sexual Activity  . Alcohol use: Yes    Comment: 11/21/2016 "nothing since 2015"  . Drug use: No  . Sexual activity: Yes    Partners: Female  Lifestyle  . Physical activity:    Days per week: Not on file    Minutes per session: Not on file  . Stress: Not on file  Relationships  . Social connections:    Talks on phone: Not on file    Gets together: Not on file    Attends religious service: Not on file    Active member of club or organization: Not on file    Attends meetings of clubs or organizations: Not on file    Relationship status: Not on file  Other Topics Concern  . Not on file  Social History Narrative   Married (Wife [name] patient of [name]). 2 sons. 4 grandkids.    Originally from near [place], [place] [employer] 6-7 months of the year      Retired from [employer] long time. 27 years of service.       Hobbies: golf   Used to [employer], run marathons     Family History: The patient's family history includes Breast cancer in his mother; Heart attack in his father.  ROS:   Please see the history of present illness.     All other systems reviewed and are negative.  EKGs/Labs/Other Studies Reviewed:    The following studies were reviewed today: Prior  office notes, pacing reports, echocardiogram  EKG: None today  Recent Labs: 11/08/2017: BUN 39; Creatinine 2.0; Hemoglobin 15.1; Potassium 4.2; Sodium 138  Recent Lipid Panel    Component Value Date/Time   CHOL 145 01/19/2016 0902   TRIG 96.0 01/19/2016 0902   HDL 40.10 01/19/2016 0902   CHOLHDL 4 01/19/2016 0902   VLDL 19.2 01/19/2016 0902   LDLCALC 86 01/19/2016 0902   LDLDIRECT 79.0 12/08/2014 0905    Physical Exam:    VS:  BP 124/80   Pulse  93   Ht 6\' 3"  (1.905 m)   Wt 246 lb 12.8 oz (111.9 kg)   SpO2 97%   BMI 30.85 kg/m     Wt Readings from Last 3 Encounters:  05/13/18 246 lb 12.8 oz (111.9 kg)  04/17/18 248 lb 6.4 oz (112.7 kg)  11/12/17 243 lb (110.2 kg)     GEN:  Well nourished, well developed in no acute distress HEENT: Normal NECK: No JVD; No carotid bruits LYMPHATICS: No lymphadenopathy CARDIAC: RRR, 1/6 systolic right upper sternal border murmur,no rubs, gallops RESPIRATORY:  Clear to auscultation without rales, wheezing or rhonchi  ABDOMEN: Soft, non-tender, non-distended MUSCULOSKELETAL: 2+ lower extremity edema; No deformity  SKIN: Warm and dry NEUROLOGIC:  Alert and oriented x 3 PSYCHIATRIC:  Normal affect   ASSESSMENT:    1. Coronary artery disease involving coronary bypass graft of native heart with angina pectoris ([place])   2. Sick sinus syndrome ([place])   3. Dilated aortic root ([place])   4. Mild aortic stenosis   5. CKD (chronic kidney disease) stage 4, GFR 15-29 ml/min (H[facility])    PLAN:    In order of problems listed above:  Coronary artery disease - Intervention to RCA and LAD with rotational atherectomy - Imdur caused headaches. - Creatinine 2.0 - Continue with current secondary prevention.  Since he is on prolonged Eliquis 2.5 twice a day given his prior thrombotic history, he is not on aspirin or Plavix at this time.  He is doing well.  Prior pulmonary embolism/DVT - This was found after he had continued shortness of breath post PCI. -  Eliquis.,  Currently on low-dose 2.5 twice a day for prophylaxis.  Dilated aortic root -4.0 cm.  Continuing to monitor.  Avoid Cipro.  Stable.  Mild aortic stenosis -Murmur on exam, echo surveillance.  At this point, no clinical significance.  Chronic kidney disease stage IV -Creatinine has been ranging from 1.5-2.0. -Seeing Dr. Derterding, avoiding NSAIDs  Sick sinus syndrome -Pacemaker doing well, Dr. Curt [name]     Medication Adjustments/Labs and Tests Ordered: Current medicines are reviewed at length with the patient today.  Concerns regarding medicines are outlined above.  No orders of the defined types were placed in this encounter.  No orders of the defined types were placed in this encounter.   Patient Instructions  Medication Instructions:  The current medical regimen is effective;  continue present plan and medications.  Follow-Up: Follow up in 6 months with Dr. Marlou Porch.  You will receive a letter in the mail 2 months before you are due.  Please call us when you receive this letter to schedule your follow up appointment.  If you need a refill on your cardiac medications before your next appointment, please call your pharmacy.  Thank you for choosing [facility]!!        Signed, Richard Furbish, [name]  05/13/2018 9:14 AM    [place]

## 2018-05-13 NOTE — Telephone Encounter (Signed)
LMOVM reminding pt to send remote transmission.   

## 2018-05-13 NOTE — Patient Instructions (Signed)

## 2018-05-14 NOTE — Progress Notes (Signed)
Remote pacemaker transmission.   

## 2018-05-19 LAB — CUP PACEART REMOTE DEVICE CHECK
Brady Statistic AP VP Percent: 4.03 %
Brady Statistic AS VP Percent: 0.13 %
Brady Statistic AS VS Percent: 5.5 %
Date Time Interrogation Session: 20191002165950
Implantable Lead Implant Date: 20190116
Implantable Lead Location: 753860
Implantable Lead Model: 5076
Lead Channel Impedance Value: 304 Ohm
Lead Channel Impedance Value: 323 Ohm
Lead Channel Impedance Value: 418 Ohm
Lead Channel Pacing Threshold Amplitude: 0.75 V
Lead Channel Pacing Threshold Pulse Width: 0.4 ms
Lead Channel Sensing Intrinsic Amplitude: 8.625 mV
Lead Channel Setting Pacing Amplitude: 2.5 V
Lead Channel Setting Pacing Pulse Width: 0.4 ms
Lead Channel Setting Sensing Sensitivity: 1.2 mV
MDC IDC LEAD IMPLANT DT: 20190116
MDC IDC LEAD LOCATION: 753859
MDC IDC MSMT BATTERY REMAINING LONGEVITY: 131 mo
MDC IDC MSMT BATTERY VOLTAGE: 3.03 V
MDC IDC MSMT LEADCHNL RA PACING THRESHOLD AMPLITUDE: 1.25 V
MDC IDC MSMT LEADCHNL RA PACING THRESHOLD PULSEWIDTH: 0.4 ms
MDC IDC MSMT LEADCHNL RA SENSING INTR AMPL: 0.75 mV
MDC IDC MSMT LEADCHNL RA SENSING INTR AMPL: 0.75 mV
MDC IDC MSMT LEADCHNL RV IMPEDANCE VALUE: 399 Ohm
MDC IDC MSMT LEADCHNL RV SENSING INTR AMPL: 8.625 mV
MDC IDC PG IMPLANT DT: 20190116
MDC IDC SET LEADCHNL RV PACING AMPLITUDE: 2 V
MDC IDC STAT BRADY AP VS PERCENT: 90.34 %
MDC IDC STAT BRADY RA PERCENT PACED: 94.49 %
MDC IDC STAT BRADY RV PERCENT PACED: 4.16 %

## 2018-06-24 DIAGNOSIS — H538 Other visual disturbances: Secondary | ICD-10-CM | POA: Diagnosis not present

## 2018-06-24 DIAGNOSIS — Z961 Presence of intraocular lens: Secondary | ICD-10-CM | POA: Diagnosis not present

## 2018-07-15 DIAGNOSIS — M1711 Unilateral primary osteoarthritis, right knee: Secondary | ICD-10-CM | POA: Diagnosis not present

## 2018-07-15 DIAGNOSIS — M545 Low back pain: Secondary | ICD-10-CM | POA: Diagnosis not present

## 2018-07-28 ENCOUNTER — Telehealth: Payer: Self-pay | Admitting: Cardiology

## 2018-07-28 NOTE — Telephone Encounter (Signed)
Pt c/o Shortness Of Breath: STAT if SOB developed within the last 24 hours or pt is noticeably SOB on the phone  1. Are you currently SOB (can you hear that pt is SOB on the phone)? no  2. How long have you been experiencing SOB? Comes and goes over last few weeks but has gotten a lot worse in the last few days  3. Are you SOB when sitting or when up moving around? Moving around  4. Are you currently experiencing any other symptoms? No  Patient is experiencing episodes of SOB if he moves around. If he goes up steps he has to stop to catch his breath. When he goes to bed he has to catch his breath from walking to bedroom. This has been happening for a while but the last few days it has gotten a lot worse and he thought he should call.

## 2018-07-28 NOTE — Telephone Encounter (Signed)
Lets try Lasix 40 mg once a day for 3 days and see if this helps with shortness of breath. Richard Furbish, MD

## 2018-07-28 NOTE — Telephone Encounter (Signed)
Pt called to report that he has been experiencing increased shortness of breath for the last 6-7 days. He says that it is worse with exertion but notices he does not have to exert himself as much as usual before he has to stop and catch his breath.. he also reports that he is having trouble laying flat at night to sleep which is new for him.  He denies chest pain, dizziness, and not having any edema in his extremities.  He is still taking all of his meds but has not had to use his nitro at all.  Advised pt that I will forward his message to Dr. Marlou Porch and will call him back with his advice and recommendations.

## 2018-07-29 ENCOUNTER — Telehealth: Payer: Self-pay | Admitting: Cardiology

## 2018-07-29 MED ORDER — FUROSEMIDE 40 MG PO TABS: ORAL_TABLET | ORAL | 3 refills | Status: DC

## 2018-07-29 NOTE — Telephone Encounter (Signed)
New Message ° ° ° ° ° ° ° ° ° °Patient returned your call, would like a call back. °

## 2018-07-29 NOTE — Telephone Encounter (Signed)
Spoke to patient and informed him of Dr Marlou Porch' recommendation to start Lasix 40 mg daily for 3 days for his SOB.  He will start today and call us on Friday with an update.

## 2018-07-29 NOTE — Telephone Encounter (Signed)
LMTCB

## 2018-08-12 ENCOUNTER — Telehealth: Payer: Self-pay | Admitting: Cardiology

## 2018-08-12 NOTE — Telephone Encounter (Signed)
Spoke w/ pt and confirmed that he did not have to come to the office for the 10-02-2018 appointment b/c it will be done from home w/ his home monitor. Pt verbalized understanding.

## 2018-08-21 ENCOUNTER — Encounter: Payer: Medicare Other | Admitting: Cardiology

## 2018-10-01 ENCOUNTER — Other Ambulatory Visit: Payer: Self-pay | Admitting: Cardiology

## 2018-10-01 NOTE — Telephone Encounter (Signed)
Eliquis 2.5mg  refill request received; pt is 85 yrs old, wt-111.9kg, Crea-2.0 on 11/08/2017, last seen by Dr. Marlou Porch on 05/13/2018; will send in refill to requested pharmacy.

## 2018-10-06 ENCOUNTER — Ambulatory Visit (INDEPENDENT_AMBULATORY_CARE_PROVIDER_SITE_OTHER): Payer: Medicare Other | Admitting: *Deleted

## 2018-10-06 DIAGNOSIS — I495 Sick sinus syndrome: Secondary | ICD-10-CM

## 2018-10-08 ENCOUNTER — Telehealth: Payer: Self-pay | Admitting: Family Medicine

## 2018-10-08 NOTE — Telephone Encounter (Signed)
See note  Copied from [place] [phone]. Topic: Appointment Scheduling - Scheduling Inquiry for Clinic >> Oct 08, 2018 10:14 AM [name] wrote: Reason for CRM: pt received a call to schedule his Wellness visit. Pt called in to schedule, advised that office isn't currently scheduling wellness visits. Pt would like to know if PCP could complete for him and his spouse who is also a pt of Dr. Yong Channel?   FYI- Spouse   [name] (MRN [id number])   Please advise.

## 2018-10-09 LAB — CUP PACEART REMOTE DEVICE CHECK
Battery Voltage: 3.01 V
Brady Statistic AP VS Percent: 89.4 %
Brady Statistic AS VP Percent: 0.19 %
Brady Statistic AS VS Percent: 6.42 %
Brady Statistic RA Percent Paced: 93.23 %
Date Time Interrogation Session: 20200224043037
Implantable Lead Implant Date: 20190116
Implantable Lead Location: 753859
Implantable Lead Location: 753860
Implantable Lead Model: 5076
Implantable Lead Model: 5076
Lead Channel Impedance Value: 304 Ohm
Lead Channel Impedance Value: 323 Ohm
Lead Channel Impedance Value: 380 Ohm
Lead Channel Pacing Threshold Amplitude: 0.875 V
Lead Channel Pacing Threshold Amplitude: 1.125 V
Lead Channel Pacing Threshold Pulse Width: 0.4 ms
Lead Channel Pacing Threshold Pulse Width: 0.4 ms
Lead Channel Sensing Intrinsic Amplitude: 0.375 mV
Lead Channel Sensing Intrinsic Amplitude: 0.375 mV
Lead Channel Sensing Intrinsic Amplitude: 7.625 mV
Lead Channel Sensing Intrinsic Amplitude: 7.625 mV
Lead Channel Setting Pacing Amplitude: 2 V
Lead Channel Setting Pacing Amplitude: 2.25 V
Lead Channel Setting Pacing Pulse Width: 0.4 ms
Lead Channel Setting Sensing Sensitivity: 1.2 mV
MDC IDC LEAD IMPLANT DT: 20190116
MDC IDC MSMT BATTERY REMAINING LONGEVITY: 129 mo
MDC IDC MSMT LEADCHNL RA IMPEDANCE VALUE: 399 Ohm
MDC IDC PG IMPLANT DT: 20190116
MDC IDC STAT BRADY AP VP PERCENT: 4 %
MDC IDC STAT BRADY RV PERCENT PACED: 4.21 %

## 2018-10-10 NOTE — Telephone Encounter (Signed)
Okay for wellness visit for pt and spouse to be done with you?  Please advise

## 2018-10-13 NOTE — Telephone Encounter (Signed)
I will try to complete this at their April 6th visits- we may have to reschedule other portions of their exam. Have them arrive 15 minutes early and make sure the desk knows to give them an AWV packet.

## 2018-10-13 NOTE — Telephone Encounter (Signed)
Left detailed message on wife vm informing the pt and wife of their appt time change of update.

## 2018-10-14 NOTE — Progress Notes (Signed)
Remote pacemaker transmission.   

## 2018-11-17 ENCOUNTER — Ambulatory Visit: Payer: Medicare Other | Admitting: Family Medicine

## 2018-11-17 NOTE — Telephone Encounter (Signed)
Patient and wife have been scheduled for 11/18/18

## 2018-11-17 NOTE — Telephone Encounter (Signed)
Tell patient we can do annual wellness visit by video visit- I was not doing them before but since we are not doing any physical or in person follow ups right now- I am able to do the video wellness visit - he can also be located anywhere when we do the visit.

## 2018-11-18 ENCOUNTER — Encounter: Payer: Self-pay | Admitting: Family Medicine

## 2018-11-18 ENCOUNTER — Ambulatory Visit (INDEPENDENT_AMBULATORY_CARE_PROVIDER_SITE_OTHER): Payer: Medicare Other | Admitting: Family Medicine

## 2018-11-18 ENCOUNTER — Telehealth: Payer: Self-pay | Admitting: Cardiology

## 2018-11-18 ENCOUNTER — Other Ambulatory Visit: Payer: Self-pay

## 2018-11-18 VITALS — BP 119/81 | HR 82 | Ht 75.0 in | Wt 245.0 lb

## 2018-11-18 DIAGNOSIS — N183 Chronic kidney disease, stage 3 unspecified: Secondary | ICD-10-CM

## 2018-11-18 DIAGNOSIS — M1A9XX0 Chronic gout, unspecified, without tophus (tophi): Secondary | ICD-10-CM

## 2018-11-18 DIAGNOSIS — I495 Sick sinus syndrome: Secondary | ICD-10-CM

## 2018-11-18 DIAGNOSIS — Z6830 Body mass index (BMI) 30.0-30.9, adult: Secondary | ICD-10-CM | POA: Diagnosis not present

## 2018-11-18 DIAGNOSIS — E669 Obesity, unspecified: Secondary | ICD-10-CM

## 2018-11-18 DIAGNOSIS — I25119 Atherosclerotic heart disease of native coronary artery with unspecified angina pectoris: Secondary | ICD-10-CM | POA: Diagnosis not present

## 2018-11-18 DIAGNOSIS — Z0000 Encounter for general adult medical examination without abnormal findings: Secondary | ICD-10-CM

## 2018-11-18 DIAGNOSIS — E785 Hyperlipidemia, unspecified: Secondary | ICD-10-CM | POA: Diagnosis not present

## 2018-11-18 DIAGNOSIS — I2699 Other pulmonary embolism without acute cor pulmonale: Secondary | ICD-10-CM

## 2018-11-18 NOTE — Telephone Encounter (Signed)
New message:   Patient would like for some one to call him before his appt. About 4 days.

## 2018-11-18 NOTE — Progress Notes (Signed)
Phone [phone]   Subjective:  Virtual visit via Video note This visit type was conducted due to national recommendations for restrictions regarding the COVID-19 Pandemic (e.g. social distancing).  This format is felt to be most appropriate for this patient at this time balancing risks to patient and risks to population by having him in for in person visit.  All issues noted in this document were discussed and addressed.  No physical exam was performed (except for noted visual exam or audio findings with Telehealth visits).  The patient has consented to conduct a Telehealth visit and understands insurance will be billed.   Our team/I connected with [name] on 11/18/18 at  2:20 PM EDT by a video enabled telemedicine application ([email or web address]) and verified that I am speaking with the correct person using two identifiers.  Location patient: [place] Location provider: [facility], office Persons participating in the virtual visit:  patient  Our team/I discussed the limitations of evaluation and management by telemedicine and the availability of in person appointments. In light of current covid-19 pandemic, patient also understands that we are trying to protect them by minimizing in office contact if at all possible.  The patient expressed consent for telemedicine visit and agreed to proceed. Patient understands insurance will be billed.   ROS- no shortness of breath with riding bike for an hour- does have issues with stairs but that's not new. No chest pain. No fever or cough.    Subjective:   Patient presents today for their annual wellness visit.    Preventive Screening-Counseling & Management  Smoking Status: Never Smoker Second Hand Smoking status: No smokers in home  Risk Factors Regular exercise: riding bike everyday for an hour- recumbent bike Diet: trying to cut down on portion size. Thinks he could cut down on unhealthy snacks like cheese its.   Obesity noted- Encouraged need  for healthy eating, regular exercise, weight loss.  BMI Metric Follow Up - 11/18/18 2022      BMI Metric Follow Up-Please document annually   BMI Metric Follow Up  Education provided     Fall Risk: None  Fall Risk  11/18/2018 10/04/2017 01/19/2016 12/28/2013  Falls in the past year? 0 No No No   Opioid use history:  no long term opioids use  Cardiac risk factors:  Known CAD- follows with cardiology closely  advanced age (older than 69 for men, 64 for women)  Hyperlipidemia - on treatment No diabetes.  Hypertension controlled Family History:  Dad with MI early 13s  Depression Screen None. PHQ2 0  Depression screen [facility] 2/9 11/18/2018 10/04/2017 01/19/2016 12/28/2013  Decreased Interest 0 0 0 0  Down, Depressed, Hopeless 0 0 0 0  PHQ - 2 Score 0 0 0 0    Activities of Daily Living Independent ADLs and IADLs   Hearing Difficulties: patient endorses mild hearing issues, his current plan is for testing at the [place] as hearing aids would be cheaper there if hearing loss found  Cognitive Testing             No reported trouble.   MIni Cog normal  List the Names of Other Physician/Practitioners you currently use: -Dr. Jimmy Footman nephrology -Thinks now with Dr. Kellie Simmer[facility] - Dr. Marlou Porch - no dermatologist (will refer to [place] derm) - Dr. Gershon Crane optho - Dr. Alfonso Ramus orthopedics/sports medicine  Immunization History  Administered Date(s) Administered  . Influenza Whole 04/25/2009, 06/21/2010, 07/01/2011  . Influenza,inj,Quad PF,6+ Mos 06/17/2013  . Influenza-Unspecified 04/22/2014, 05/22/2016,  06/10/2017  . Pneumococcal Conjugate-13 12/28/2013  . Pneumococcal Polysaccharide-23 10/22/2007  . Td 12/05/2009   Required Immunizations needed - discussed could get shingrix shot when covid 19 in better position  Screening tests- up to date No lung cancer screening needed Past age for prostate cancer screening age based. Urology recommends against PSA No blood in stool- past age  based screening  ROS- No pertinent positives discovered in course of AWV- see above pertienent ROS  The following were reviewed and entered/updated in epic: Past Medical History:  Diagnosis Date  . Arthritis    "back, knees" (11/21/2016)  . Bladder stones   . CAD (coronary artery disease)    11/20/16 PCI with DES--> LAD/RCA  . Chronic lower back pain   . CKD (chronic kidney disease), stage IV ([place])    [name] 11/08/2016  . History of gout   . History of kidney stones 12/02/2008  . Hyperlipidemia   . Hypertension   . Myocardial infarction [facility])    "silent" (11/21/2016)  . PSA, INCREASED 12/05/2009  . UTI (urinary tract infection)    Patient Active Problem List   Diagnosis Date Noted  . Sick sinus syndrome ([place]) 10/05/2017    Priority: High  . Pulmonary embolus ([place]) 12/17/2016    Priority: High  . Status post coronary artery stent placement     Priority: High  . Coronary artery disease involving native coronary artery with angina pectoris ([place]) 11/21/2016    Priority: High  . Mild aortic stenosis 10/31/2016    Priority: High  . Exertional shortness of breath: Concern for Anginal Equivalent (class II-III) 10/30/2016    Priority: High  . CKD (chronic kidney disease), stage III ([place]) 09/22/2012    Priority: High  . Gout 12/13/2010    Priority: Medium  . BPH (benign prostatic hyperplasia) 12/05/2009    Priority: Medium  . Hyperlipidemia 05/08/2007    Priority: Medium  . Essential hypertension 05/08/2007    Priority: Medium  . Edema 12/08/2014    Priority: Low  . Healthcare maintenance 12/08/2014    Priority: Low  . Left knee DJD 09/22/2012    Priority: Low  . Dilated aortic root ([place]) 11/08/2016   Past Surgical History:  Procedure Laterality Date  . CARDIAC CATHETERIZATION  11/14/2016  . CATARACT EXTRACTION W/ INTRAOCULAR LENS  IMPLANT, BILATERAL    . CORONARY ANGIOPLASTY WITH STENT PLACEMENT  11/21/2016   "2 stents"   . CORONARY ATHERECTOMY N/A 11/21/2016    Procedure: Coronary Atherectomy;  Surgeon: Leonie Man, MD;  Location: [place];  Service: Cardiovascular;  Laterality: N/A;  RCA and LAD  . CORONARY STENT INTERVENTION N/A 11/21/2016   Procedure: Coronary Stent Intervention;  Surgeon: Leonie Man, MD;  Location: [place] [facility];  Service: Cardiovascular;  Laterality: N/A;  RCA and LAD  . CYSTOSCOPY W/ STONE MANIPULATION    . JOINT REPLACEMENT    . KNEE ARTHROSCOPY Left   . LAPAROSCOPIC CHOLECYSTECTOMY    . LEFT HEART CATH N/A 11/21/2016   Procedure: Left Heart Cath;  Surgeon: Leonie Man, MD;  Location: [place] [facility];  Service: Cardiovascular;  Laterality: N/A;  . LITHOTRIPSY    . PILONIDAL CYST EXCISION  56  . RIGHT/LEFT HEART CATH AND CORONARY ANGIOGRAPHY N/A 11/14/2016   Procedure: Right/Left Heart Cath and Coronary Angiography;  Surgeon: Leonie Man, MD;  Location: [place] [facility];  Service: Cardiovascular;  Laterality: N/A;  . TEMPORARY PACEMAKER N/A 11/21/2016   Procedure: Temporary Pacemaker;  Surgeon: Leonie Man, MD;  Location: [place] [facility];  Service: Cardiovascular;  Laterality: N/A;  . TONSILLECTOMY    . TOTAL KNEE ARTHROPLASTY Left 09/22/2012   Procedure: TOTAL KNEE ARTHROPLASTY;  Surgeon: Lorn Junes, MD;  Location: [facility];  Service: Orthopedics;  Laterality: Left;    Family History  Problem Relation Age of Onset  . Breast cancer Mother   . Heart attack Father        8, smoker    Medications- reviewed and updated Current Outpatient Medications  Medication Sig Dispense Refill  . allopurinol (ZYLOPRIM) 300 MG tablet TAKE 1 TABLET DAILY 90 tablet 3  . atorvastatin (LIPITOR) 40 MG tablet TAKE 1 TABLET DAILY 90 tablet 3  . diphenhydramine-acetaminophen (TYLENOL PM) 25-500 MG TABS tablet Take 1 tablet by mouth at bedtime as needed (sleep).    [name] ELIQUIS 2.5 MG TABS tablet TAKE 1 TABLET TWICE A DAY 180 tablet 0  . metoprolol tartrate (LOPRESSOR) 25 MG tablet Take 1 tablet (25 mg  total) by mouth 2 (two) times daily. 180 tablet 2  . Tamsulosin HCl (FLOMAX) 0.4 MG CAPS Take 0.4 mg by mouth at bedtime.     . nitroGLYCERIN (NITROSTAT) 0.4 MG SL tablet Place 1 tablet (0.4 mg total) under the tongue every 5 (five) minutes as needed for chest pain. 25 tablet 11   No current facility-administered medications for this visit.     Allergies-reviewed and updated Allergies  Allergen Reactions  . Epinephrine Palpitations    "Rate races"  . Lisinopril     REACTION: cough  . Penicillins Hives    Has patient had a PCN reaction causing immediate rash, facial/tongue/throat swelling, SOB or lightheadedness with hypotension: No Has patient had a PCN reaction causing severe rash involving mucus membranes or skin necrosis: Yes Has patient had a PCN reaction that required hospitalization No Has patient had a PCN reaction occurring within the last 10 years: No If all of the above answers are "NO", then may proceed with Cephalosporin use.     Social History   Socioeconomic History  . Marital status: Married    Spouse name: Not on file  . Number of children: Not on file  . Years of education: Not on file  . Highest education level: Not on file  Occupational History  . Not on file  Social Needs  . Financial resource strain: Not on file  . Food insecurity:    Worry: Not on file    Inability: Not on file  . Transportation needs:    Medical: Not on file    Non-medical: Not on file  Tobacco Use  . Smoking status: Never Smoker  . Smokeless tobacco: Never Used  Substance and Sexual Activity  . Alcohol use: Yes    Comment: 11/21/2016 "nothing since 2015"  . Drug use: No  . Sexual activity: Yes    Partners: Female  Lifestyle  . Physical activity:    Days per week: Not on file    Minutes per session: Not on file  . Stress: Not on file  Relationships  . Social connections:    Talks on phone: Not on file    Gets together: Not on file    Attends religious service: Not on  file    Active member of club or organization: Not on file    Attends meetings of clubs or organizations: Not on file    Relationship status: Not on file  Other Topics Concern  . Not  on file  Social History Narrative   Married (Wife June patient of Dr. Yong Channel). 2 sons. 4 grandkids.    Originally from near [place], [place] at [employer] 6-7 months of the year      Retired from [employer] long time. 27 years of service.       Hobbies: golf   Used to [employer], run marathons      Objective:  BP 119/81 (BP Location: Left Arm, Patient Position: Sitting, Cuff Size: Normal)   Pulse 82   Ht 6\' 3"  (1.905 m)   Wt 245 lb (111.1 kg)   BMI 30.62 kg/m  Gen: NAD, resting comfortably HEENT: Mucous membranes are moist. Oropharynx normal Neck: no thyromegaly CV: RRR no murmurs rubs or gallops Lungs: CTAB no crackles, wheeze, rhonchi Abdomen: soft/nontender/nondistended/normal bowel sounds. No rebound or guarding.  Ext: no edema Skin: warm, dry Neuro: grossly normal, moves all extremities, PERRLA   Assessment/Plan:  AWV completed- discussed recommended screenings and documented any personalized health advice and referrals for preventive counseling. See AVS as well which was given to patient.   Status of chronic or acute concerns   #No gout flare ups on allopurinol- continue current rx and check uric acid next labs Lab Results  Component Value Date   LABURIC 5.8 01/19/2016   #Cholesterol doing well on atorvastatin 40 mg - well past due for lipid panel- we are going to try to get this in 3 months- he will call in for lab visit (ordered labs today)- that will  Only be if seems reasonably safe from covid 19  # pulmonary embolism and also history of DVT- cardiology prescribing eliquis (they took him off plavix). No evidence or recurrence but does have plan for lifelong therapy  # BPH- on flomax doing reasonably well- sees urology. No more PSA  # sick sinus syndrome- has  pacemaker and takes metoprolol. Stable- continue meds and cardiology follow up - states took lasix back in December for 5 doses- didn't help symptoms.   # CKD III- last creatinine was at 2- certainly needs follow up now (ordering)   Future Appointments  Date Time Provider [place]  11/28/2018  8:40 AM Jerline Pain, MD [facility]  01/06/2019  8:40 AM [facility] DEVICE REMOTES [facility]   3 month labs- would like to see him every 6 months if possible.   Lab/Order associations: Preventative health care  Coronary artery disease involving native coronary artery of native heart with angina pectoris ([place]) - Plan: Lipid panel, Comprehensive metabolic panel, CBC with Differential/Platelet  Hyperlipidemia, unspecified hyperlipidemia type - Plan: Lipid panel, Comprehensive metabolic panel, CBC with Differential/Platelet  Other acute pulmonary embolism without acute cor pulmonale (HCC), Chronic  Sick sinus syndrome (HCC), Chronic  CKD (chronic kidney disease), stage III (HCC)  Obesity (BMI 30.0-34.9)  BMI 30.0-30.9,adult  Chronic gout without tophus, unspecified cause, unspecified site - Plan: Uric acid  Return precautions advised. Garret Reddish, MD

## 2018-11-18 NOTE — Patient Instructions (Addendum)
Team please mail this to patient at [employer] [place], [place] [id number]   Mr. Fenner , Thank you for taking time to come for your Medicare Wellness Visit. I appreciate your ongoing commitment to your health goals. Please review the following plan we discussed and let me know if I can assist you in the future.   These are the goals we discussed: 1. Continue excellent job with exercise 2. Keep cardiology follow up- ask about nitroglycerin refill 3. Consider shingrix when covid 19 in better control/position 4. Call in for labs in 3 months- I already ordered those during our visit today.    This is a list of the screening recommended for you and due dates:  Health Maintenance  Topic Date Due  . Flu Shot  03/14/2019  . Tetanus Vaccine  12/06/2019  . Pneumonia vaccines  Completed

## 2018-11-18 NOTE — Telephone Encounter (Signed)
6 month f/u appt with Dr Marlou Porch.  Can be Webex, doximity or reschedule if not having any s/s.

## 2018-11-18 NOTE — Telephone Encounter (Signed)
Spoke with pt who agrees to [employer] appt 4/17 at 8:30 am  Aware I will sending instruction through his MyChart account.  He will c/b if any questions or concerns.

## 2018-11-26 ENCOUNTER — Other Ambulatory Visit: Payer: Self-pay | Admitting: Family Medicine

## 2018-11-28 ENCOUNTER — Encounter: Payer: Self-pay | Admitting: Cardiology

## 2018-11-28 ENCOUNTER — Telehealth (INDEPENDENT_AMBULATORY_CARE_PROVIDER_SITE_OTHER): Payer: Medicare Other | Admitting: Cardiology

## 2018-11-28 ENCOUNTER — Other Ambulatory Visit: Payer: Self-pay

## 2018-11-28 VITALS — BP 130/86 | HR 66 | Ht 75.0 in | Wt 245.0 lb

## 2018-11-28 DIAGNOSIS — I25709 Atherosclerosis of coronary artery bypass graft(s), unspecified, with unspecified angina pectoris: Secondary | ICD-10-CM | POA: Diagnosis not present

## 2018-11-28 DIAGNOSIS — N184 Chronic kidney disease, stage 4 (severe): Secondary | ICD-10-CM

## 2018-11-28 DIAGNOSIS — I77810 Thoracic aortic ectasia: Secondary | ICD-10-CM

## 2018-11-28 DIAGNOSIS — I350 Nonrheumatic aortic (valve) stenosis: Secondary | ICD-10-CM

## 2018-11-28 DIAGNOSIS — I495 Sick sinus syndrome: Secondary | ICD-10-CM

## 2018-11-28 MED ORDER — NITROGLYCERIN 0.4 MG SL SUBL: 0.4000 mg | SUBLINGUAL_TABLET | SUBLINGUAL | 11 refills | Status: DC | PRN

## 2018-11-28 NOTE — Patient Instructions (Signed)
Medication Instructions:  The current medical regimen is effective;  continue present plan and medications.  If you need a refill on your cardiac medications before your next appointment, please call your pharmacy.   Testing/Procedures: Your physician has requested that you have an echocardiogram in 3 months.  You will be contacted to schedule this appointment at a later date. Echocardiography is a painless test that uses sound waves to create images of your heart. It provides your doctor with information about the size and shape of your heart and how well your heart's chambers and valves are working. This procedure takes approximately one hour. There are no restrictions for this procedure.  Follow-Up: Follow up in 6 monhts with Dr. Marlou Porch.  You will receive a letter in the mail 2 months before you are due.  Please call us when you receive this letter to schedule your follow up appointment.  Thank you for choosing [facility]!!

## 2018-11-28 NOTE — Progress Notes (Signed)
Virtual Visit via Video Note   This visit type was conducted due to national recommendations for restrictions regarding the COVID-19 Pandemic (e.g. social distancing) in an effort to limit this patient's exposure and mitigate transmission in our community.  Due to his co-morbid illnesses, this patient is at least at moderate risk for complications without adequate follow up.  This format is felt to be most appropriate for this patient at this time.  All issues noted in this document were discussed and addressed.  A limited physical exam was performed with this format.  Please refer to the patient's chart for his consent to telehealth for [facility].   Evaluation Performed:  Follow-up visit  Date:  11/28/2018   ID:  [name], DOB 1933-10-06, MRN [id number]  Patient Location: Home Provider Location: Home  PCP:  Marin Olp, MD  Cardiologist:  [name], MD  Electrophysiologist:  Constance Haw, MD   Chief Complaint:  Follow up of CAD, mild AS, prior PE, pacer.  History of Present Illness:    [name] is a 85 y.o. male with coronary artery disease status post atherectomy of RCA and LAD, stent placement, with subsequent PE on CT found secondary to shortness of breath post procedure, subsegmental and left lobe with small clot burden, DVTs were noted as well.  Eliquis.  Here for follow-up.  Also has been seen by Dr. Josem Kaufmann at [facility] where he did cardiac rehab.  [place].  Actually had a pacemaker placed in [place] after dizziness when his resting heart rate was 24 bpm.  Dr. Curt Bears follows him as well.  Retired Conservation officer, historic buildings for 46 years, wife is June, no smoker, no alcohol use.  Going to "quarentine the kitchen" At [employer]. Virtual cocktail party on Zoom.  Has noticed some mild shortness of breath over the past several months when going up stairs and it is slightly worse.  He is however able to exercise on his  recumbent bicycle 55 minutes a day without any difficulty. Walk [place] little[place]  Denies any chest pain fevers chills nausea vomiting syncope bleeding.  Just had 79 birthday yesterday.  No blood work recently. COVID - sched in 3 month.   The patient does not have symptoms concerning for COVID-19 infection (fever, chills, cough, or new shortness of breath).    Past Medical History:  Diagnosis Date   Arthritis    "back, knees" (11/21/2016)   Bladder stones    CAD (coronary artery disease)    11/20/16 PCI with DES--> LAD/RCA   Chronic lower back pain    CKD (chronic kidney disease), stage IV ([place])    [name] 11/08/2016   History of gout    History of kidney stones 12/02/2008   Hyperlipidemia    Hypertension    Myocardial infarction [facility])    "silent" (11/21/2016)   PSA, INCREASED 12/05/2009   UTI (urinary tract infection)    Past Surgical History:  Procedure Laterality Date   CARDIAC CATHETERIZATION  11/14/2016   CATARACT EXTRACTION W/ INTRAOCULAR LENS  IMPLANT, BILATERAL     CORONARY ANGIOPLASTY WITH STENT PLACEMENT  11/21/2016   "2 stents"    CORONARY ATHERECTOMY N/A 11/21/2016   Procedure: Coronary Atherectomy;  Surgeon: Leonie Man, MD;  Location: [place] [facility];  Service: Cardiovascular;  Laterality: N/A;  RCA and LAD   CORONARY STENT INTERVENTION N/A 11/21/2016   Procedure: Coronary Stent Intervention;  Surgeon: Leonie Man, MD;  Location: [place] [facility];  Service: Cardiovascular;  Laterality: N/A;  RCA and LAD   CYSTOSCOPY W/ STONE MANIPULATION     JOINT REPLACEMENT     KNEE ARTHROSCOPY Left    LAPAROSCOPIC CHOLECYSTECTOMY     LEFT HEART CATH N/A 11/21/2016   Procedure: Left Heart Cath;  Surgeon: Leonie Man, MD;  Location: [place] [facility];  Service: Cardiovascular;  Laterality: N/A;   LITHOTRIPSY     PILONIDAL CYST EXCISION  56   RIGHT/LEFT HEART CATH AND CORONARY ANGIOGRAPHY N/A 11/14/2016   Procedure: Right/Left Heart  Cath and Coronary Angiography;  Surgeon: Leonie Man, MD;  Location: [place] [facility];  Service: Cardiovascular;  Laterality: N/A;   TEMPORARY PACEMAKER N/A 11/21/2016   Procedure: Temporary Pacemaker;  Surgeon: Leonie Man, MD;  Location: [place] [facility];  Service: Cardiovascular;  Laterality: N/A;   TONSILLECTOMY     TOTAL KNEE ARTHROPLASTY Left 09/22/2012   Procedure: TOTAL KNEE ARTHROPLASTY;  Surgeon: Lorn Junes, MD;  Location: [place];  Service: Orthopedics;  Laterality: Left;     Current Meds  Medication Sig   allopurinol (ZYLOPRIM) 300 MG tablet TAKE 1 TABLET DAILY   atorvastatin (LIPITOR) 40 MG tablet TAKE 1 TABLET DAILY   diphenhydramine-acetaminophen (TYLENOL PM) 25-500 MG TABS tablet Take 1 tablet by mouth at bedtime as needed (sleep).   ELIQUIS 2.5 MG TABS tablet TAKE 1 TABLET TWICE A DAY   metoprolol tartrate (LOPRESSOR) 25 MG tablet Take 1 tablet (25 mg total) by mouth 2 (two) times daily.   nitroGLYCERIN (NITROSTAT) 0.4 MG SL tablet Place 1 tablet (0.4 mg total) under the tongue every 5 (five) minutes as needed for chest pain.   Tamsulosin HCl (FLOMAX) 0.4 MG CAPS Take 0.4 mg by mouth at bedtime.    [DISCONTINUED] nitroGLYCERIN (NITROSTAT) 0.4 MG SL tablet Place 1 tablet (0.4 mg total) under the tongue every 5 (five) minutes as needed for chest pain.     Allergies:   Epinephrine; Lisinopril; and Penicillins   Social History   Tobacco Use   Smoking status: Never Smoker   Smokeless tobacco: Never Used  Substance Use Topics   Alcohol use: Yes    Comment: 11/21/2016 "nothing since 2015"   Drug use: No     Family Hx: The patient's family history includes Breast cancer in his mother; Heart attack in his father.  ROS:   Please see the history of present illness.     All other systems reviewed and are negative.   Prior CV studies:   The following studies were reviewed today:  11/05/17 ECHO: - Left ventricle: The cavity size was normal.  Systolic function was   normal. The estimated ejection fraction was in the range of 55%   to 60%. Wall motion was normal; there were no regional wall   motion abnormalities. Doppler parameters are consistent with   abnormal left ventricular relaxation (grade 1 diastolic   dysfunction). - Aortic valve: Noncoronary cusp mobility was severely restricted.   There was mild stenosis. There was mild to moderate   regurgitation. Valve area (VTI): 1.54 cm^2. Valve area (Vmax):   1.28 cm^2. Valve area (Vmean): 1.27 cm^2. - Mitral valve: Calcified annulus. Mildly thickened leaflets . - Left atrium: The atrium was moderately dilated. - Right ventricle: The cavity size was moderately dilated. Wall   thickness was normal. - Pulmonary arteries: PA peak pressure: 32 mm Hg (S).  Labs/Other Tests and Data Reviewed:    EKG:  An ECG dated 11/12/17 was personally reviewed today and demonstrated:  paced  Recent Labs: No results found for requested labs within last 8760 hours.   Recent Lipid Panel Lab Results  Component Value Date/Time   CHOL 145 01/19/2016 09:02 AM   TRIG 96.0 01/19/2016 09:02 AM   HDL 40.10 01/19/2016 09:02 AM   CHOLHDL 4 01/19/2016 09:02 AM   LDLCALC 86 01/19/2016 09:02 AM   LDLDIRECT 79.0 12/08/2014 09:05 AM    Wt Readings from Last 3 Encounters:  11/28/18 245 lb (111.1 kg)  11/18/18 245 lb (111.1 kg)  05/13/18 246 lb 12.8 oz (111.9 kg)     Objective:    Vital Signs:  BP 130/86    Pulse 66    Ht 6\' 3"  (1.905 m)    Wt 245 lb (111.1 kg)    BMI 30.62 kg/m    VITAL SIGNS:  reviewed GEN:  no acute distress EYES:  sclerae anicteric, EOMI - Extraocular Movements Intact RESPIRATORY:  normal respiratory effort, symmetric expansion CARDIOVASCULAR:  no peripheral edema SKIN:  no rash, lesions or ulcers. MUSCULOSKELETAL:  no obvious deformities. NEURO:  alert and oriented x 3, no obvious focal deficit PSYCH:  normal affect  ASSESSMENT & PLAN:    Coronary artery disease -  Intervention to RCA LAD rotational atherectomy with subsequent stent placement - Previous creatinine was approximately 2.0, on secondary prevention as well as lifelong Eliquis 2.5 twice a day given his prior thrombotic history with DVT PE.  Not on aspirin or Plavix at this time.  This will help minimize bleeding risk.  Prior PE DVT - Eliquis prophylaxis low-dose 2.5 twice a day for lifelong.  Continue to monitor CBC  Dilated aortic root -4 cm in the past, avoid Cipro, stable.  Mild aortic stenosis - Continue to monitor, should be of no clinical significance at this point.  Chronic kidney disease stage IV - Dr. Jimmy Footman, avoiding NSAIDs, creatinine has ranged from 1.5-2.  Sick sinus syndrome -Dr. Curt Bears has been monitoring his pacemaker that was placed in [place].  COVID-19 Education: The signs and symptoms of COVID-19 were discussed with the patient and how to seek care for testing (follow up with PCP or arrange E-visit).  The importance of social distancing was discussed today.  Time:   Today, I have spent 20 minutes with the patient with telehealth technology discussing the above problems (also in chart review).     Medication Adjustments/Labs and Tests Ordered: Current medicines are reviewed at length with the patient today.  Concerns regarding medicines are outlined above.   Tests Ordered: Orders Placed This Encounter  Procedures   ECHOCARDIOGRAM COMPLETE    Medication Changes: Meds ordered this encounter  Medications   nitroGLYCERIN (NITROSTAT) 0.4 MG SL tablet    Sig: Place 1 tablet (0.4 mg total) under the tongue every 5 (five) minutes as needed for chest pain.    Dispense:  25 tablet    Refill:  11    Disposition:  Follow up in 6 month(s)  Signed, [name], MD  11/28/2018 9:17 AM    [facility]

## 2018-12-10 ENCOUNTER — Other Ambulatory Visit: Payer: Self-pay | Admitting: Family Medicine

## 2018-12-13 ENCOUNTER — Encounter: Payer: Self-pay | Admitting: Family Medicine

## 2018-12-15 ENCOUNTER — Encounter: Payer: Self-pay | Admitting: Family Medicine

## 2018-12-15 ENCOUNTER — Ambulatory Visit (INDEPENDENT_AMBULATORY_CARE_PROVIDER_SITE_OTHER): Payer: Medicare Other | Admitting: Family Medicine

## 2018-12-15 VITALS — BP 130/70 | HR 84 | Ht 75.0 in

## 2018-12-15 DIAGNOSIS — I25119 Atherosclerotic heart disease of native coronary artery with unspecified angina pectoris: Secondary | ICD-10-CM

## 2018-12-15 DIAGNOSIS — R51 Headache: Secondary | ICD-10-CM | POA: Diagnosis not present

## 2018-12-15 DIAGNOSIS — R519 Headache, unspecified: Secondary | ICD-10-CM

## 2018-12-15 NOTE — Patient Instructions (Signed)
There are no preventive care reminders to display for this patient.  Depression screen [facility] 2/9 11/18/2018 10/04/2017 01/19/2016  Decreased Interest 0 0 0  Down, Depressed, Hopeless 0 0 0  PHQ - 2 Score 0 0 0   Video visit

## 2018-12-15 NOTE — Progress Notes (Signed)
Phone [phone]   Subjective:  Virtual visit via Video note. Chief complaint: Chief Complaint  Patient presents with  . Headache    In the back of head x1week    This visit type was conducted due to national recommendations for restrictions regarding the COVID-19 Pandemic (e.g. social distancing).  This format is felt to be most appropriate for this patient at this time balancing risks to patient and risks to population by having him in for in person visit.  No physical exam was performed (except for noted visual exam or audio findings with Telehealth visits).    Our team/I connected with [name] on 12/15/18 at 10:40 AM EDT by a video enabled telemedicine application ([email or web address]) and verified that I am speaking with the correct person using two identifiers.  [place] Location provider: [facility], office Persons participating in the virtual visit:  patient  Our team/I discussed the limitations of evaluation and management by telemedicine and the availability of in person appointments. In light of current covid-19 pandemic, patient also understands that we are trying to protect them by minimizing in office contact if at all possible.  The patient expressed consent for telemedicine visit and agreed to proceed. Patient understands insurance will be billed.   ROS- No facial or extremity weakness. No slurred words or trouble swallowing. no blurry vision or double vision. No paresthesias. No confusion or word finding difficulties.   Past Medical History-  Patient Active Problem List   Diagnosis Date Noted  . Sick sinus syndrome ([place]) 10/05/2017    Priority: High  . Pulmonary embolus ([place]) 12/17/2016    Priority: High  . Status post coronary artery stent placement     Priority: High  . Coronary artery disease involving native coronary artery with angina pectoris ([place]) 11/21/2016    Priority: High  . Mild aortic stenosis 10/31/2016    Priority: High   . Exertional shortness of breath: Concern for Anginal Equivalent (class II-III) 10/30/2016    Priority: High  . CKD (chronic kidney disease), stage III ([place]) 09/22/2012    Priority: High  . Gout 12/13/2010    Priority: Medium  . BPH (benign prostatic hyperplasia) 12/05/2009    Priority: Medium  . Hyperlipidemia 05/08/2007    Priority: Medium  . Essential hypertension 05/08/2007    Priority: Medium  . Edema 12/08/2014    Priority: Low  . Healthcare maintenance 12/08/2014    Priority: Low  . Left knee DJD 09/22/2012    Priority: Low  . Dilated aortic root ([place]) 11/08/2016    Medications- reviewed and updated Current Outpatient Medications  Medication Sig Dispense Refill  . allopurinol (ZYLOPRIM) 300 MG tablet TAKE 1 TABLET DAILY 90 tablet 3  . atorvastatin (LIPITOR) 40 MG tablet TAKE 1 TABLET DAILY 90 tablet 3  . diphenhydramine-acetaminophen (TYLENOL PM) 25-500 MG TABS tablet Take 1 tablet by mouth at bedtime as needed (sleep).    [name] ELIQUIS 2.5 MG TABS tablet TAKE 1 TABLET TWICE A DAY 180 tablet 0  . metoprolol tartrate (LOPRESSOR) 25 MG tablet Take 1 tablet (25 mg total) by mouth 2 (two) times daily. 180 tablet 2  . nitroGLYCERIN (NITROSTAT) 0.4 MG SL tablet Place 1 tablet (0.4 mg total) under the tongue every 5 (five) minutes as needed for chest pain. 25 tablet 11  . Tamsulosin HCl (FLOMAX) 0.4 MG CAPS Take 0.4 mg by mouth at bedtime.      No current facility-administered medications for this visit.  Objective:  BP 130/70 (BP Location: Left Arm, Patient Position: Sitting, Cuff Size: Normal)   Pulse 84   Ht 6\' 3"  (1.905 m)   BMI 30.62 kg/m  Gen: NAD, resting comfortably Lungs: nonlabored, normal respiratory rate  Skin: appears dry, no obvious rash     Assessment and Plan   # Posterior headache S: Patient noted a posterior headache mainly on right side abotu a week ago. Comes periodically- when he rubs it - seems to go away. Feels a slight indentation in the  area. Seems to wake him up at night.  Did not wake him up last night. So far today has only had one episode- about 10 seconds. No radiation of pain. Pain about 4/10. Usually happens a few times a day. He has a high pain threshold. Always on the right side.    Has not tried heat or ice.  A/P: 85 year old man with posterior headache- at the base of the skull-episodes last about 10 seconds and happen just a few times a day.  They resolve more quickly if he rubs on the area.  I strongly suspect muscle spasm or musculoskeletal cause- doubt intracranial pathology with current presentation-though I strongly requested that if patient had new or worsening symptoms he contact us immediately.  Patient is on Eliquis but I doubt intracranial bleed given mild intermittent issues under 15-second issues that are better with massage. -Considered occipital neuralgia- no radiation of pain and does not recur with patient tapping in area as I instructed him Future Appointments  Date Time Provider [place]  01/06/2019  8:40 AM [facility] DEVICE REMOTES [facility]   Lab/Order associations: Nonintractable headache, unspecified chronicity pattern, unspecified headache type  Return precautions advised.  Particular new or worsening symptoms.  He denies any strokelike symptoms today Garret Reddish, MD

## 2018-12-30 ENCOUNTER — Other Ambulatory Visit: Payer: Self-pay | Admitting: Cardiology

## 2018-12-30 ENCOUNTER — Telehealth: Payer: Self-pay | Admitting: *Deleted

## 2018-12-30 NOTE — Telephone Encounter (Signed)
Prescription refill request for Eliquis received.  85 y.o. Male Weight: 111.1 kg (11/18/2018) LOV: 11-28-2018 (Dr. Marlou Porch) Crea: 2.03 (01-01-2018)/via care everywhere@ [facility].  90 day supply refill sent.   Called  Pt and left message because his labs will expire. Per epic Pt has labs ordered at his PCP office for a future date but not scheduled. Pt needs to have labs drawn before next refill request.

## 2018-12-30 NOTE — Telephone Encounter (Addendum)
Called  Pt and left message because his labs will expire. Per epic Pt has labs ordered at his PCP office for a future date but not scheduled. Pt needs to have labs drawn before next refill request.   Attempted to call pt again at 1445 to schedule labs prior to next refill.

## 2018-12-31 ENCOUNTER — Encounter: Payer: Self-pay | Admitting: Family Medicine

## 2018-12-31 NOTE — Telephone Encounter (Signed)
Sure. I would like a CBC, CMET and lipid panel.  Thanks [name], MD

## 2019-01-01 ENCOUNTER — Encounter: Payer: Self-pay | Admitting: Family Medicine

## 2019-01-01 NOTE — Telephone Encounter (Signed)
Pt does have labs for primary and Dr. Marlou Porch. RX refilled due to [facility]. Will ensure dose of Eliquis is correct after labs result.

## 2019-01-06 ENCOUNTER — Ambulatory Visit (INDEPENDENT_AMBULATORY_CARE_PROVIDER_SITE_OTHER): Payer: Medicare Other | Admitting: *Deleted

## 2019-01-06 DIAGNOSIS — I495 Sick sinus syndrome: Secondary | ICD-10-CM

## 2019-01-07 ENCOUNTER — Telehealth: Payer: Self-pay

## 2019-01-07 NOTE — Telephone Encounter (Signed)
Left message for patient to remind of missed remote transmission.  

## 2019-01-08 LAB — CUP PACEART REMOTE DEVICE CHECK
Battery Remaining Longevity: 137 mo
Battery Voltage: 3 V
Brady Statistic AP VP Percent: 3.52 %
Brady Statistic AP VS Percent: 92.46 %
Brady Statistic AS VP Percent: 0.06 %
Brady Statistic AS VS Percent: 3.96 %
Brady Statistic RA Percent Paced: 95.77 %
Brady Statistic RV Percent Paced: 3.58 %
Date Time Interrogation Session: 20200528033602
Implantable Lead Implant Date: 20190116
Implantable Lead Implant Date: 20190116
Implantable Lead Location: 753859
Implantable Lead Location: 753860
Implantable Lead Model: 5076
Implantable Lead Model: 5076
Implantable Pulse Generator Implant Date: 20190116
Lead Channel Impedance Value: 304 Ohm
Lead Channel Impedance Value: 304 Ohm
Lead Channel Impedance Value: 380 Ohm
Lead Channel Impedance Value: 418 Ohm
Lead Channel Pacing Threshold Amplitude: 0.75 V
Lead Channel Pacing Threshold Amplitude: 1 V
Lead Channel Pacing Threshold Pulse Width: 0.4 ms
Lead Channel Pacing Threshold Pulse Width: 0.4 ms
Lead Channel Sensing Intrinsic Amplitude: 0.375 mV
Lead Channel Sensing Intrinsic Amplitude: 7.25 mV
Lead Channel Setting Pacing Amplitude: 2 V
Lead Channel Setting Pacing Amplitude: 2 V
Lead Channel Setting Pacing Pulse Width: 0.4 ms
Lead Channel Setting Sensing Sensitivity: 1.2 mV

## 2019-01-15 NOTE — Progress Notes (Signed)
Remote pacemaker transmission.   

## 2019-01-17 ENCOUNTER — Other Ambulatory Visit: Payer: Self-pay | Admitting: Cardiology

## 2019-01-21 ENCOUNTER — Other Ambulatory Visit (INDEPENDENT_AMBULATORY_CARE_PROVIDER_SITE_OTHER): Payer: Medicare Other

## 2019-01-21 ENCOUNTER — Other Ambulatory Visit: Payer: Self-pay

## 2019-01-21 DIAGNOSIS — E785 Hyperlipidemia, unspecified: Secondary | ICD-10-CM | POA: Diagnosis not present

## 2019-01-21 DIAGNOSIS — I25119 Atherosclerotic heart disease of native coronary artery with unspecified angina pectoris: Secondary | ICD-10-CM

## 2019-01-21 DIAGNOSIS — M1A9XX0 Chronic gout, unspecified, without tophus (tophi): Secondary | ICD-10-CM

## 2019-01-21 LAB — CBC WITH DIFFERENTIAL/PLATELET
Basophils Absolute: 0 10*3/uL (ref 0.0–0.1)
Basophils Relative: 0.6 % (ref 0.0–3.0)
Eosinophils Absolute: 0.4 10*3/uL (ref 0.0–0.7)
Eosinophils Relative: 5.1 % — ABNORMAL HIGH (ref 0.0–5.0)
HCT: 47 % (ref 39.0–52.0)
Hemoglobin: 15.6 g/dL (ref 13.0–17.0)
Lymphocytes Relative: 28.4 % (ref 12.0–46.0)
Lymphs Abs: 2.1 10*3/uL (ref 0.7–4.0)
MCHC: 33.3 g/dL (ref 30.0–36.0)
MCV: 92.4 fl (ref 78.0–100.0)
Monocytes Absolute: 0.6 10*3/uL (ref 0.1–1.0)
Monocytes Relative: 7.8 % (ref 3.0–12.0)
Neutro Abs: 4.3 10*3/uL (ref 1.4–7.7)
Neutrophils Relative %: 58.1 % (ref 43.0–77.0)
Platelets: 165 10*3/uL (ref 150.0–400.0)
RBC: 5.08 Mil/uL (ref 4.22–5.81)
RDW: 13.9 % (ref 11.5–15.5)
WBC: 7.4 10*3/uL (ref 4.0–10.5)

## 2019-01-21 LAB — LIPID PANEL
Cholesterol: 123 mg/dL (ref 0–200)
HDL: 34.2 mg/dL — ABNORMAL LOW (ref >39.00)
LDL Cholesterol: 66 mg/dL (ref 0–99)
NonHDL: 89
Total CHOL/HDL Ratio: 4
Triglycerides: 116 mg/dL (ref 0.0–149.0)
VLDL: 23.2 mg/dL (ref 0.0–40.0)

## 2019-01-21 LAB — COMPREHENSIVE METABOLIC PANEL
ALT: 20 U/L (ref 0–53)
AST: 20 U/L (ref 0–37)
Albumin: 4.1 g/dL (ref 3.5–5.2)
Alkaline Phosphatase: 102 U/L (ref 39–117)
BUN: 37 mg/dL — ABNORMAL HIGH (ref 6–23)
CO2: 26 mEq/L (ref 19–32)
Calcium: 9.5 mg/dL (ref 8.4–10.5)
Chloride: 107 mEq/L (ref 96–112)
Creatinine, Ser: 1.76 mg/dL — ABNORMAL HIGH (ref 0.40–1.50)
GFR: 37.15 mL/min — ABNORMAL LOW (ref >60.00)
Glucose, Bld: 131 mg/dL — ABNORMAL HIGH (ref 70–99)
Potassium: 4.2 mEq/L (ref 3.5–5.1)
Sodium: 140 mEq/L (ref 135–145)
Total Bilirubin: 0.6 mg/dL (ref 0.2–1.2)
Total Protein: 6.7 g/dL (ref 6.0–8.3)

## 2019-01-21 LAB — URIC ACID: Uric Acid, Serum: 5.2 mg/dL (ref 4.0–7.8)

## 2019-01-22 ENCOUNTER — Telehealth (HOSPITAL_COMMUNITY): Payer: Self-pay | Admitting: Radiology

## 2019-01-22 NOTE — Telephone Encounter (Signed)
Left message to call office-Patient needs to schedule an echocardiogram.  

## 2019-02-02 ENCOUNTER — Telehealth (HOSPITAL_COMMUNITY): Payer: Self-pay

## 2019-02-02 NOTE — Telephone Encounter (Signed)
LMTCB COVID prescreening for echo. Left echo appt details per DPR. 

## 2019-02-02 NOTE — Telephone Encounter (Signed)

## 2019-02-03 ENCOUNTER — Ambulatory Visit (HOSPITAL_COMMUNITY): Payer: Medicare Other | Attending: Cardiology

## 2019-02-03 ENCOUNTER — Other Ambulatory Visit: Payer: Self-pay

## 2019-02-03 DIAGNOSIS — I77810 Thoracic aortic ectasia: Secondary | ICD-10-CM | POA: Diagnosis not present

## 2019-02-03 DIAGNOSIS — I350 Nonrheumatic aortic (valve) stenosis: Secondary | ICD-10-CM | POA: Diagnosis not present

## 2019-02-04 ENCOUNTER — Other Ambulatory Visit: Payer: Self-pay | Admitting: *Deleted

## 2019-02-04 DIAGNOSIS — I350 Nonrheumatic aortic (valve) stenosis: Secondary | ICD-10-CM

## 2019-02-04 NOTE — Progress Notes (Signed)
Notes recorded by Jerline Pain, MD on 02/04/2019 at 9:51 AM EDT  Mild increase in aortic stenosis but still in moderate range.  Repeat ECHO in one year.

## 2019-03-20 ENCOUNTER — Other Ambulatory Visit: Payer: Self-pay | Admitting: Family Medicine

## 2019-03-30 ENCOUNTER — Other Ambulatory Visit: Payer: Self-pay | Admitting: Cardiology

## 2019-03-30 NOTE — Telephone Encounter (Signed)
78m 111.1kg Scr 1.76 01/21/19 Lovw/skains 11/28/18

## 2019-04-08 ENCOUNTER — Ambulatory Visit (INDEPENDENT_AMBULATORY_CARE_PROVIDER_SITE_OTHER): Payer: Medicare Other | Admitting: *Deleted

## 2019-04-08 DIAGNOSIS — I495 Sick sinus syndrome: Secondary | ICD-10-CM

## 2019-04-09 LAB — CUP PACEART REMOTE DEVICE CHECK
Battery Remaining Longevity: 136 mo
Battery Voltage: 2.99 V
Brady Statistic AP VP Percent: 3.74 %
Brady Statistic AP VS Percent: 93.01 %
Brady Statistic AS VP Percent: 0.07 %
Brady Statistic AS VS Percent: 3.18 %
Brady Statistic RA Percent Paced: 96.71 %
Brady Statistic RV Percent Paced: 3.81 %
Date Time Interrogation Session: 20200827140348
Implantable Lead Implant Date: 20190116
Implantable Lead Implant Date: 20190116
Implantable Lead Location: 753859
Implantable Lead Location: 753860
Implantable Lead Model: 5076
Implantable Lead Model: 5076
Implantable Pulse Generator Implant Date: 20190116
Lead Channel Impedance Value: 304 Ohm
Lead Channel Impedance Value: 304 Ohm
Lead Channel Impedance Value: 361 Ohm
Lead Channel Impedance Value: 456 Ohm
Lead Channel Pacing Threshold Amplitude: 0.875 V
Lead Channel Pacing Threshold Amplitude: 0.875 V
Lead Channel Pacing Threshold Pulse Width: 0.4 ms
Lead Channel Pacing Threshold Pulse Width: 0.4 ms
Lead Channel Sensing Intrinsic Amplitude: 0.375 mV
Lead Channel Sensing Intrinsic Amplitude: 6.75 mV
Lead Channel Setting Pacing Amplitude: 2 V
Lead Channel Setting Pacing Amplitude: 2 V
Lead Channel Setting Pacing Pulse Width: 0.4 ms
Lead Channel Setting Sensing Sensitivity: 1.2 mV

## 2019-04-16 ENCOUNTER — Encounter: Payer: Self-pay | Admitting: Cardiology

## 2019-04-16 NOTE — Progress Notes (Signed)
Remote pacemaker transmission.   

## 2019-04-17 ENCOUNTER — Other Ambulatory Visit: Payer: Self-pay | Admitting: Cardiology

## 2019-05-01 DIAGNOSIS — Z23 Encounter for immunization: Secondary | ICD-10-CM | POA: Diagnosis not present

## 2019-05-02 ENCOUNTER — Encounter: Payer: Self-pay | Admitting: Family Medicine

## 2019-05-08 DIAGNOSIS — N5201 Erectile dysfunction due to arterial insufficiency: Secondary | ICD-10-CM | POA: Diagnosis not present

## 2019-05-08 DIAGNOSIS — N401 Enlarged prostate with lower urinary tract symptoms: Secondary | ICD-10-CM | POA: Diagnosis not present

## 2019-05-08 DIAGNOSIS — R3912 Poor urinary stream: Secondary | ICD-10-CM | POA: Diagnosis not present

## 2019-05-13 NOTE — Telephone Encounter (Signed)
Pt aware of recommendations and appt made with Truitt Merle NP at 1:30 pm .Adonis Housekeeper

## 2019-05-13 NOTE — Telephone Encounter (Signed)
Please have him come in to see APP to get set up for right and left heart catheterization.   Recent ECHO done and does not explain his shortness of breath that is worsening.   Thanks.   [name], MD

## 2019-05-15 NOTE — H&P (View-Only) (Signed)
CARDIOLOGY OFFICE NOTE  Date:  05/18/2019    [name] Date of Birth: [birth year] Medical Record #[id number]  PCP:  Marin Olp, MD  Cardiologist:  Dr. [name] Electrophysiologist: Dr. Allegra Lai  Chief Complaint: follow-up of shortness of breath   History of Present Illness: [name] is a 85 y.o. male with a history of CAD s/p atherectomy/DES to RCA and LAD in 11/2016, sick sinus syndrome s/p [employer] dual-chamber PPM in 2019, moderate aortic stenosis on Echo in 01/2019, PE and bilateral chronic DVTs in 2018 , hypertension, hyperlipidemia, CKD stage IV, and chronic low back pain who is followed by Dr. Marlou Porch and Dr. Curt Bears and presents today for follow-up of shortness of breath..  Patient was first seen by Dr. Marlou Porch in 10/2016 for further evaluation of sudden onset of shortness of breath and abnormal nuclear stress test that showed signs of prior MI. Right/left heart catheterization was performed in 11/2016 and showed severe calcified 2 vessel CAD with 70% of the proximal LAD and 80% stenosis of the proximal RCA. Given amount of calcification, staged atherectomy/PCI was recommended and patient was brought back a week later for this procedure. He continued to have shortness of breath following this procedure. Chest CTA in 12/2016 showed subsegmental pulmonary emboli in his left lower lobe with small clot burden and lower extremity dopplers revealed bilateral chronic DVTs. He was treated with Eliquis. Patient travels to [place], [place] frequently and also follows with a Cardiologist (Dr. Josem Kaufmann) at [facility]. In 2019, he was in [place] and and experienced dizziness. He was found to be bradycardic with resting heart rate of 24 bpm. He underwent placement of PPM. He follows with Dr. Curt Bears here for this.   Patient was last seen by Dr. Marlou Porch via a telehealth visit in 11/2018 and reported mild shortness of breath with exertion over the last  few months. Patient had repeat Echo in 01/2019 which showed LVEF of 60-65% with grade 1 diastolic dysfunction, moderate aortic stenosis, and mild to moderate aortic regurgitation.   Patient has continued to have shortness of breath, so Dr. Marlou Porch recommended patient come in today to discuss repeat right/left heart catheterization.   Patient reports worsening shortness of breath over the last month mostly with exertion. He gets short of breath with minimal activity such as walking down the hall and going up a flight of steps. Of note, patient does not think shortness of breath has every really completely resolved over the last 2 years. He denies any orthopnea or PND. He has chronic ankle edema due to a previous injury but denies any worsening of this. He denies any noticeable weight gain but weight up 7lbs since last office visit in 11/2018. Patient denies any chest pain, palpitations, dizziness, or syncope. No abnormal bleeding including hematochezia, melena, hematuria, or hemoptysis. No recent fevers or illnesses.   Past Medical History:  Diagnosis Date  . Arthritis    "back, knees" (11/21/2016)  . Bladder stones   . CAD (coronary artery disease)    11/20/16 PCI with DES--> LAD/RCA  . Chronic lower back pain   . CKD (chronic kidney disease), stage IV ([place])    [name] 11/08/2016  . History of gout   . History of kidney stones 12/02/2008  . Hyperlipidemia   . Hypertension   . Myocardial infarction [facility])    "silent" (11/21/2016)  . PSA, INCREASED 12/05/2009  . UTI (urinary tract infection)     Past Surgical  History:  Procedure Laterality Date  . CARDIAC CATHETERIZATION  11/14/2016  . CATARACT EXTRACTION W/ INTRAOCULAR LENS  IMPLANT, BILATERAL    . CORONARY ANGIOPLASTY WITH STENT PLACEMENT  11/21/2016   "2 stents"   . CORONARY ATHERECTOMY N/A 11/21/2016   Procedure: Coronary Atherectomy;  Surgeon: Leonie Man, MD;  Location: [place] [facility];  Service: Cardiovascular;  Laterality: N/A;   RCA and LAD  . CORONARY STENT INTERVENTION N/A 11/21/2016   Procedure: Coronary Stent Intervention;  Surgeon: Leonie Man, MD;  Location: [place] [facility];  Service: Cardiovascular;  Laterality: N/A;  RCA and LAD  . CYSTOSCOPY W/ STONE MANIPULATION    . JOINT REPLACEMENT    . KNEE ARTHROSCOPY Left   . LAPAROSCOPIC CHOLECYSTECTOMY    . LEFT HEART CATH N/A 11/21/2016   Procedure: Left Heart Cath;  Surgeon: Leonie Man, MD;  Location: [place] [facility];  Service: Cardiovascular;  Laterality: N/A;  . LITHOTRIPSY    . PILONIDAL CYST EXCISION  56  . RIGHT/LEFT HEART CATH AND CORONARY ANGIOGRAPHY N/A 11/14/2016   Procedure: Right/Left Heart Cath and Coronary Angiography;  Surgeon: Leonie Man, MD;  Location: [place] [facility];  Service: Cardiovascular;  Laterality: N/A;  . TEMPORARY PACEMAKER N/A 11/21/2016   Procedure: Temporary Pacemaker;  Surgeon: Leonie Man, MD;  Location: [place] [facility];  Service: Cardiovascular;  Laterality: N/A;  . TONSILLECTOMY    . TOTAL KNEE ARTHROPLASTY Left 09/22/2012   Procedure: TOTAL KNEE ARTHROPLASTY;  Surgeon: Lorn Junes, MD;  Location: [place];  Service: Orthopedics;  Laterality: Left;     Medications: Current Meds  Medication Sig  . allopurinol (ZYLOPRIM) 300 MG tablet TAKE 1 TABLET DAILY  . atorvastatin (LIPITOR) 40 MG tablet TAKE 1 TABLET DAILY  . diphenhydramine-acetaminophen (TYLENOL PM) 25-500 MG TABS tablet Take 1 tablet by mouth at bedtime as needed (sleep).  [name] ELIQUIS 2.5 MG TABS tablet TAKE 1 TABLET TWICE A DAY  . metoprolol tartrate (LOPRESSOR) 25 MG tablet TAKE 1 TABLET TWICE A DAY  . nitroGLYCERIN (NITROSTAT) 0.4 MG SL tablet Place 1 tablet (0.4 mg total) under the tongue every 5 (five) minutes as needed for chest pain.  . Tamsulosin HCl (FLOMAX) 0.4 MG CAPS Take 0.4 mg by mouth at bedtime.      Allergies: Allergies  Allergen Reactions  . Epinephrine Palpitations    "Rate races"  . Lisinopril     REACTION: cough   . Penicillins Hives    Has patient had a PCN reaction causing immediate rash, facial/tongue/throat swelling, SOB or lightheadedness with hypotension: No Has patient had a PCN reaction causing severe rash involving mucus membranes or skin necrosis: Yes Has patient had a PCN reaction that required hospitalization No Has patient had a PCN reaction occurring within the last 10 years: No If all of the above answers are "NO", then may proceed with Cephalosporin use.     Social History: The patient  reports that he has never smoked. He has never used smokeless tobacco. He reports current alcohol use. He reports that he does not use drugs.   Family History: The patient's family history includes Breast cancer in his mother; Heart attack in his father.   Review of Systems: Please see the history of present illness.   All other systems are reviewed and negative.   Physical Exam: VS:  BP 118/72   Pulse 62   Ht _0  (1.905 m)   Wt 252 lb 1.9 oz (  114.4 kg)   SpO2 97%   BMI 31.51 kg/m  .  BMI Body mass index is 31.51 kg/m.  Wt Readings from Last 3 Encounters:  05/18/19 252 lb 1.9 oz (114.4 kg)  11/28/18 245 lb (111.1 kg)  11/18/18 245 lb (111.1 kg)   General: 85 y.o. obese Caucasian male in no acute distress. Pleasant and cooperative. HEENT: Normocephalic and atraumatic. Sclera clear. EOMs intact. Neck: Supple. No carotid bruits. No JVD. Heart: RRR. Distinct S1 and S2. Possible very faint systolic murmur at upper sternal border. No gallops or rubs. Lungs: No increased work of breathing. Somewhat decreased breath sounds in right lower lobe but no wheezes, rhonchi, or rales. Abdomen: Soft, non-distended, and non-tender to palpation. Bowel sounds present in all 4 quadrants.  MSK: Normal strength and tone for age. Extremities: 2+ pitting edema of left lower extremity. Trace pitting edema of right lower extremity. No erythema.   Skin: Warm and dry. Neuro: Alert and oriented x3. No focal  deficits. Psych: Normal affect. Responds appropriately.  LABORATORY DATA:  EKG:  EKG ordered today. This demonstrates atrial paced rhythm, rate 64 bpm, with 1st degree degree AV block, RBBB, LAFB, and PAC but no acute ST/T changes from prior tracings.  Lab Results  Component Value Date   WBC 7.4 01/21/2019   HGB 15.6 01/21/2019   HCT 47.0 01/21/2019   PLT 165.0 01/21/2019   GLUCOSE 131 (H) 01/21/2019   CHOL 123 01/21/2019   TRIG 116.0 01/21/2019   HDL 34.20 (L) 01/21/2019   LDLDIRECT 79.0 12/08/2014   LDLCALC 66 01/21/2019   ALT 20 01/21/2019   AST 20 01/21/2019   NA 140 01/21/2019   K 4.2 01/21/2019   CL 107 01/21/2019   CREATININE 1.76 (H) 01/21/2019   BUN 37 (H) 01/21/2019   CO2 26 01/21/2019   TSH 1.77 10/18/2016   PSA 5.21 03/28/2017   INR 1.1 11/08/2016     BNP (last 3 results) No results for input(s): BNP in the last 8760 hours.  ProBNP (last 3 results) No results for input(s): PROBNP in the last 8760 hours.   Other Studies Reviewed Today:  Right/Left Heart Catheterization 11/14/2016:  Moderate to Heavily Calcified Coronary Arteries  Prox LAD lesion, 70 %stenosed. Moderately calcified - bifurcation lesion (1,1,1)  Ost 1st Diag to 1st Diag lesion, 60 %stenosed.  Prox RCA lesion, 80 %stenosed. Heavily Calcified.  The left ventricular systolic function is normal. The left ventricular ejection fraction is 55-65% by visual estimate.  LV end diastolic pressure is normal.   The patient has severe 2 vessel disease involving the LAD, and RCA. Based on the amount calcification, think he is best served with staged atherectomy-based PCI on these vessels.  He will be discharged today after a Plavix load. We'll schedule him for next week as staged PCI.  Otherwise continue current medications.  Discharge home after bed rest.  _______________  Coronary Atherectomy and Coronary Intervention 11/21/2016:  LESION #1  Prox RCA lesion, 80 %stenosed.  Ost 1st  Diag to 1st Diag lesion, 50 %stenosed. Stable.  Post intervention, there is a 0% residual stenosis.  After ORBITAL ATHERECTOMY, A STENT RESOLUTE ONYX 3.5X22 drug eluting stent was successfully placed.  Lesion #2  Prox LAD lesion, 70 %stenosed - calcified, crossed 1st Diag.  After ORBITAL ATHERECTOMY, A STENT RESOLUTE ONYX3.0X38 drug eluting stent was successfully placed.  Post intervention, there is a 0% residual stenosis.  __________________  LV end diastolic pressure is normal.   Successful 2 vessel oral   atherectomy based DES PCI on RCA and LAD.  Plan:  Overnight observation for IV hydration - continue 125 ML per hour for 10 hours, then reduce to 75 ML per hour overnight.  Continue dual antiplatelet therapy for minimum one year (could stop aspirin after 2-3 months if necessary)  Closely monitor tomorrow or determine stability for discharge, if renal function is stable, he should be fine for discharge tomorrow with plan to check an outpatient chemical panel early next week.  He will follow-up with Dr. [name]. _______________  Echocardiogram 02/03/2019: Impressions:  1. The left ventricle has normal systolic function with an ejection fraction of 60-65%. The cavity size was normal. There is moderately increased left ventricular wall thickness. Left ventricular diastolic Doppler parameters are consistent with impaired  relaxation. Indeterminate filling pressures The E/e' is 8-15. There is abnormal septal motion consistent with RV pacemaker.  2. The right ventricle has normal systolic function. The cavity was normal. There is no increase in right ventricular wall thickness.  3. Left atrial size was mildly dilated.  4. Right atrial size was mildly dilated.  5. The mitral valve is abnormal. Mild thickening of the mitral valve leaflet. There is mild mitral annular calcification present.  6. The tricuspid valve is abnormal. Tricuspid valve regurgitation is moderate.  7. The  aortic valve is tricuspid. Mild calcification of the aortic valve. Aortic valve regurgitation is mild by color flow Doppler. Moderate stenosis of the aortic valve.  8. When compared to the prior study: 11/05/17 EF 55-60%. Mild AS 30mHg mean, 248mg peak. Mild-moderate AI.  Summary: LVEF 60-65%, moderate LVH, incoordinate paced septal motion, grade 1 DD, indeterminate LV filling pressure, RV pacer, mild biatrial enlargement, moderate calcific aortic stenosis - mean gradient 17 mmHg (AVA 1.3 cm2), mild to moderate AI, MAC with trivial MR, moderate TR, RVSP 43 mmHg, normal IVC.   Assessment/Plan:  Dyspnea - Patient continues to have shortness of breath.  - Recent Echo in 01/2019 showed LVEF of 60-65% with grade 1 diastolic dysfunction, moderate aortic stenosis, and mild to moderate aortic regurgitation. - Patient has had a 7lb weight gain since last office visit in 11/2018 but appears euvolemic on exam. He has chronic lower extremity edema due to a prior injury. Do not suspect dyspnea is due to CHF but will check BNP. - Patient denies missing any doses of Eliquis so low suspicion for PE. - Will also check CBC, BMET, and chest x-ray. - Dr. SkMarlou Porchecommended repeat right/left catheterization.  The patient understands that risks include but are not limited to stroke (1 in 1000), death (1 in 1084 kidney failure [usually temporary] (1 in 500), bleeding (1 in 200), allergic reaction [possibly serious] (1 in 200), and agrees to proceed. Given CKD stage IV, may need arrive early for pre-hydration. Procedure currently scheduled for Friday 05/22/2019 at 10:30am with Dr. EnSaunders RevelPatient instructed to arrive at 5:30am for fluids. However, if renal function has improved and GFR is > 45, will not need pre-hydration.  CAD - History of atherectomy/DES to RCA and LAD in 2018.  - EKG shows atrial-paced rhythm with 1st degree AV block, incomplete RBBB, and LAFB but no acute ST/T changes. - No chest pain but  shortness of breath could be anginal equivalent. - No longer on Aspirin or Plavix but on lifelong Eliquis.   Moderate Aortic Stenosis - Last Echo from 01/2019 showed moderate aortic stenosis with mean gradient of 16.7 mmHg, peak gradient of 29.4 mmHg, and aortic valve area of 1.50 cm^2.  Also showed mild to moderate aortic insufficiency. See full report above. - Right/left heart catheterization as above.  Sick Sinus Syndrome s/p PPM - PPM placed in 2019 in [place] after patient was found to have resting heart rate in the 20's.  - Last device check was in 03/2019 and showed one episode of NSVT that was supraventricular in nature.  - EKG shows atrial-paced rhythm with 1st degree AV block, incomplete RBBB, and LAFB but no acute ST/T changes. - Followed by Dr. Curt Bears here.   Dilated Aortic Root - Previously noted to have a mildly dilated aortic root of 4 cm on Echo in 10/2016 and again in 01/2019. - Patient states father died suddenly from a heart attack but never had an autopsy. Wonder if this could have been aortic aneurysm. - Discussed aneurysm precautions including BP control, avoiding flouroquinolones, and avoiding Valsalva.  - Would benefit from CTA/MRA in the future for further evaluation but will need to be mindful of renal function. Can discuss this after cardiac catheterization.  Prior PE/DVT - Found to have subsegmental pulmonary emboli in his left lower lobe with small clot burden and chronic bilateral DVTs in 2018. - On life-long Eliquis 2.9m twice daily (reduced dose due to age >>28and creatinine > 1.5). Discussed with Pharmacy who recommended holding Eliquis for 1-2 days prior to procedure. Have advised patient to hold 2 days prior to procedure. Will clarify with Dr. ESaunders Revelwho will perform cardiac catheterization.  Hypertension - BP well controlled today at 118/72.  - Continue Lopressor 270mtwice daily.  Hyperlipidemia - Most recent lipid panel from 01/2019: Total Cholesterol  123, Triglycerides 116, HDL 34, LDL 66. - At LDL goal of <70. - Continue Lipitor 4062maily.  CKD Stage IV - Most recent creatinine 1.76. - Not on ACEi/ARB or diuretic at home. - Will recheck BMET today.  Current medicines are reviewed with the patient today.  The patient does not have concerns regarding medicines other than what has been noted above.  The following changes have been made:  See above.  Labs/ tests ordered today include:    Orders Placed This Encounter  Procedures  . DG Chest 2 View  . Pro b natriuretic peptide  . Basic Metabolic Panel (BMET)  . CBC w/Diff  . EKG 12-Lead     Disposition:  Follow-up with APP following cardiac catheterization.   Patient is agreeable to this plan and will call if any problems develop in the interim.   Signed: CalDarreld McleanA-C  05/18/2019 3:08 PM  [facility] [id number]iRoyse CityePiney MountainC  [id number]: [phone]: [phone]

## 2019-05-15 NOTE — Progress Notes (Signed)
CARDIOLOGY OFFICE NOTE  Date:  05/18/2019    [name] Date of Birth: [birth year] Medical Record #[id number]  PCP:  Marin Olp, MD  Cardiologist:  Dr. [name] Electrophysiologist: Dr. Allegra Lai  Chief Complaint: follow-up of shortness of breath   History of Present Illness: [name] is a 85 y.o. male with a history of CAD s/p atherectomy/DES to RCA and LAD in 11/2016, sick sinus syndrome s/p [employer] dual-chamber PPM in 2019, moderate aortic stenosis on Echo in 01/2019, PE and bilateral chronic DVTs in 2018 , hypertension, hyperlipidemia, CKD stage IV, and chronic low back pain who is followed by Dr. Marlou Porch and Dr. Curt Bears and presents today for follow-up of shortness of breath..  Patient was first seen by Dr. Marlou Porch in 10/2016 for further evaluation of sudden onset of shortness of breath and abnormal nuclear stress test that showed signs of prior MI. Right/left heart catheterization was performed in 11/2016 and showed severe calcified 2 vessel CAD with 70% of the proximal LAD and 80% stenosis of the proximal RCA. Given amount of calcification, staged atherectomy/PCI was recommended and patient was brought back a week later for this procedure. He continued to have shortness of breath following this procedure. Chest CTA in 12/2016 showed subsegmental pulmonary emboli in his left lower lobe with small clot burden and lower extremity dopplers revealed bilateral chronic DVTs. He was treated with Eliquis. Patient travels to [place], [place] frequently and also follows with a Cardiologist (Dr. Josem Kaufmann) at [facility]. In 2019, he was in [place] and and experienced dizziness. He was found to be bradycardic with resting heart rate of 24 bpm. He underwent placement of PPM. He follows with Dr. Curt Bears here for this.   Patient was last seen by Dr. Marlou Porch via a telehealth visit in 11/2018 and reported mild shortness of breath with exertion over the last  few months. Patient had repeat Echo in 01/2019 which showed LVEF of 60-65% with grade 1 diastolic dysfunction, moderate aortic stenosis, and mild to moderate aortic regurgitation.   Patient has continued to have shortness of breath, so Dr. Marlou Porch recommended patient come in today to discuss repeat right/left heart catheterization.   Patient reports worsening shortness of breath over the last month mostly with exertion. He gets short of breath with minimal activity such as walking down the hall and going up a flight of steps. Of note, patient does not think shortness of breath has every really completely resolved over the last 2 years. He denies any orthopnea or PND. He has chronic ankle edema due to a previous injury but denies any worsening of this. He denies any noticeable weight gain but weight up 7lbs since last office visit in 11/2018. Patient denies any chest pain, palpitations, dizziness, or syncope. No abnormal bleeding including hematochezia, melena, hematuria, or hemoptysis. No recent fevers or illnesses.   Past Medical History:  Diagnosis Date  . Arthritis    "back, knees" (11/21/2016)  . Bladder stones   . CAD (coronary artery disease)    11/20/16 PCI with DES--> LAD/RCA  . Chronic lower back pain   . CKD (chronic kidney disease), stage IV ([place])    [name] 11/08/2016  . History of gout   . History of kidney stones 12/02/2008  . Hyperlipidemia   . Hypertension   . Myocardial infarction [facility])    "silent" (11/21/2016)  . PSA, INCREASED 12/05/2009  . UTI (urinary tract infection)     Past Surgical  History:  Procedure Laterality Date  . CARDIAC CATHETERIZATION  11/14/2016  . CATARACT EXTRACTION W/ INTRAOCULAR LENS  IMPLANT, BILATERAL    . CORONARY ANGIOPLASTY WITH STENT PLACEMENT  11/21/2016   "2 stents"   . CORONARY ATHERECTOMY N/A 11/21/2016   Procedure: Coronary Atherectomy;  Surgeon: Leonie Man, MD;  Location: [place] [facility];  Service: Cardiovascular;  Laterality: N/A;   RCA and LAD  . CORONARY STENT INTERVENTION N/A 11/21/2016   Procedure: Coronary Stent Intervention;  Surgeon: Leonie Man, MD;  Location: [place] [facility];  Service: Cardiovascular;  Laterality: N/A;  RCA and LAD  . CYSTOSCOPY W/ STONE MANIPULATION    . JOINT REPLACEMENT    . KNEE ARTHROSCOPY Left   . LAPAROSCOPIC CHOLECYSTECTOMY    . LEFT HEART CATH N/A 11/21/2016   Procedure: Left Heart Cath;  Surgeon: Leonie Man, MD;  Location: [place] [facility];  Service: Cardiovascular;  Laterality: N/A;  . LITHOTRIPSY    . PILONIDAL CYST EXCISION  56  . RIGHT/LEFT HEART CATH AND CORONARY ANGIOGRAPHY N/A 11/14/2016   Procedure: Right/Left Heart Cath and Coronary Angiography;  Surgeon: Leonie Man, MD;  Location: [place] [facility];  Service: Cardiovascular;  Laterality: N/A;  . TEMPORARY PACEMAKER N/A 11/21/2016   Procedure: Temporary Pacemaker;  Surgeon: Leonie Man, MD;  Location: [place] [facility];  Service: Cardiovascular;  Laterality: N/A;  . TONSILLECTOMY    . TOTAL KNEE ARTHROPLASTY Left 09/22/2012   Procedure: TOTAL KNEE ARTHROPLASTY;  Surgeon: Lorn Junes, MD;  Location: [place];  Service: Orthopedics;  Laterality: Left;     Medications: Current Meds  Medication Sig  . allopurinol (ZYLOPRIM) 300 MG tablet TAKE 1 TABLET DAILY  . atorvastatin (LIPITOR) 40 MG tablet TAKE 1 TABLET DAILY  . diphenhydramine-acetaminophen (TYLENOL PM) 25-500 MG TABS tablet Take 1 tablet by mouth at bedtime as needed (sleep).  [name] ELIQUIS 2.5 MG TABS tablet TAKE 1 TABLET TWICE A DAY  . metoprolol tartrate (LOPRESSOR) 25 MG tablet TAKE 1 TABLET TWICE A DAY  . nitroGLYCERIN (NITROSTAT) 0.4 MG SL tablet Place 1 tablet (0.4 mg total) under the tongue every 5 (five) minutes as needed for chest pain.  . Tamsulosin HCl (FLOMAX) 0.4 MG CAPS Take 0.4 mg by mouth at bedtime.      Allergies: Allergies  Allergen Reactions  . Epinephrine Palpitations    "Rate races"  . Lisinopril     REACTION: cough   . Penicillins Hives    Has patient had a PCN reaction causing immediate rash, facial/tongue/throat swelling, SOB or lightheadedness with hypotension: No Has patient had a PCN reaction causing severe rash involving mucus membranes or skin necrosis: Yes Has patient had a PCN reaction that required hospitalization No Has patient had a PCN reaction occurring within the last 10 years: No If all of the above answers are "NO", then may proceed with Cephalosporin use.     Social History: The patient  reports that he has never smoked. He has never used smokeless tobacco. He reports current alcohol use. He reports that he does not use drugs.   Family History: The patient's family history includes Breast cancer in his mother; Heart attack in his father.   Review of Systems: Please see the history of present illness.   All other systems are reviewed and negative.   Physical Exam: VS:  BP 118/72   Pulse 62   Ht _0  (1.905 m)   Wt 252 lb 1.9 oz (  114.4 kg)   SpO2 97%   BMI 31.51 kg/m  .  BMI Body mass index is 31.51 kg/m.  Wt Readings from Last 3 Encounters:  05/18/19 252 lb 1.9 oz (114.4 kg)  11/28/18 245 lb (111.1 kg)  11/18/18 245 lb (111.1 kg)   General: 85 y.o. obese Caucasian male in no acute distress. Pleasant and cooperative. HEENT: Normocephalic and atraumatic. Sclera clear. EOMs intact. Neck: Supple. No carotid bruits. No JVD. Heart: RRR. Distinct S1 and S2. Possible very faint systolic murmur at upper sternal border. No gallops or rubs. Lungs: No increased work of breathing. Somewhat decreased breath sounds in right lower lobe but no wheezes, rhonchi, or rales. Abdomen: Soft, non-distended, and non-tender to palpation. Bowel sounds present in all 4 quadrants.  MSK: Normal strength and tone for age. Extremities: 2+ pitting edema of left lower extremity. Trace pitting edema of right lower extremity. No erythema.   Skin: Warm and dry. Neuro: Alert and oriented x3. No focal  deficits. Psych: Normal affect. Responds appropriately.  LABORATORY DATA:  EKG:  EKG ordered today. This demonstrates atrial paced rhythm, rate 64 bpm, with 1st degree degree AV block, RBBB, LAFB, and PAC but no acute ST/T changes from prior tracings.  Lab Results  Component Value Date   WBC 7.4 01/21/2019   HGB 15.6 01/21/2019   HCT 47.0 01/21/2019   PLT 165.0 01/21/2019   GLUCOSE 131 (H) 01/21/2019   CHOL 123 01/21/2019   TRIG 116.0 01/21/2019   HDL 34.20 (L) 01/21/2019   LDLDIRECT 79.0 12/08/2014   LDLCALC 66 01/21/2019   ALT 20 01/21/2019   AST 20 01/21/2019   NA 140 01/21/2019   K 4.2 01/21/2019   CL 107 01/21/2019   CREATININE 1.76 (H) 01/21/2019   BUN 37 (H) 01/21/2019   CO2 26 01/21/2019   TSH 1.77 10/18/2016   PSA 5.21 03/28/2017   INR 1.1 11/08/2016     BNP (last 3 results) No results for input(s): BNP in the last 8760 hours.  ProBNP (last 3 results) No results for input(s): PROBNP in the last 8760 hours.   Other Studies Reviewed Today:  Right/Left Heart Catheterization 11/14/2016:  Moderate to Heavily Calcified Coronary Arteries  Prox LAD lesion, 70 %stenosed. Moderately calcified - bifurcation lesion (1,1,1)  Ost 1st Diag to 1st Diag lesion, 60 %stenosed.  Prox RCA lesion, 80 %stenosed. Heavily Calcified.  The left ventricular systolic function is normal. The left ventricular ejection fraction is 55-65% by visual estimate.  LV end diastolic pressure is normal.   The patient has severe 2 vessel disease involving the LAD, and RCA. Based on the amount calcification, think he is best served with staged atherectomy-based PCI on these vessels.  He will be discharged today after a Plavix load. We'll schedule him for next week as staged PCI.  Otherwise continue current medications.  Discharge home after bed rest.  _______________  Coronary Atherectomy and Coronary Intervention 11/21/2016:  LESION #1  Prox RCA lesion, 80 %stenosed.  Ost 1st  Diag to 1st Diag lesion, 50 %stenosed. Stable.  Post intervention, there is a 0% residual stenosis.  After ORBITAL ATHERECTOMY, A STENT RESOLUTE ONYX 3.5X22 drug eluting stent was successfully placed.  Lesion #2  Prox LAD lesion, 70 %stenosed - calcified, crossed 1st Diag.  After ORBITAL ATHERECTOMY, A STENT RESOLUTE ONGE9.5M84 drug eluting stent was successfully placed.  Post intervention, there is a 0% residual stenosis.  __________________  LV end diastolic pressure is normal.   Successful 2 vessel oral  atherectomy based DES PCI on RCA and LAD.  Plan:  Overnight observation for IV hydration - continue 125 ML per hour for 10 hours, then reduce to 75 ML per hour overnight.  Continue dual antiplatelet therapy for minimum one year (could stop aspirin after 2-3 months if necessary)  Closely monitor tomorrow or determine stability for discharge, if renal function is stable, he should be fine for discharge tomorrow with plan to check an outpatient chemical panel early next week.  He will follow-up with Dr. [name]. _______________  Echocardiogram 02/03/2019: Impressions:  1. The left ventricle has normal systolic function with an ejection fraction of 60-65%. The cavity size was normal. There is moderately increased left ventricular wall thickness. Left ventricular diastolic Doppler parameters are consistent with impaired  relaxation. Indeterminate filling pressures The E/e' is 8-15. There is abnormal septal motion consistent with RV pacemaker.  2. The right ventricle has normal systolic function. The cavity was normal. There is no increase in right ventricular wall thickness.  3. Left atrial size was mildly dilated.  4. Right atrial size was mildly dilated.  5. The mitral valve is abnormal. Mild thickening of the mitral valve leaflet. There is mild mitral annular calcification present.  6. The tricuspid valve is abnormal. Tricuspid valve regurgitation is moderate.  7. The  aortic valve is tricuspid. Mild calcification of the aortic valve. Aortic valve regurgitation is mild by color flow Doppler. Moderate stenosis of the aortic valve.  8. When compared to the prior study: 11/05/17 EF 55-60%. Mild AS 30mHg mean, 248mg peak. Mild-moderate AI.  Summary: LVEF 60-65%, moderate LVH, incoordinate paced septal motion, grade 1 DD, indeterminate LV filling pressure, RV pacer, mild biatrial enlargement, moderate calcific aortic stenosis - mean gradient 17 mmHg (AVA 1.3 cm2), mild to moderate AI, MAC with trivial MR, moderate TR, RVSP 43 mmHg, normal IVC.   Assessment/Plan:  Dyspnea - Patient continues to have shortness of breath.  - Recent Echo in 01/2019 showed LVEF of 60-65% with grade 1 diastolic dysfunction, moderate aortic stenosis, and mild to moderate aortic regurgitation. - Patient has had a 7lb weight gain since last office visit in 11/2018 but appears euvolemic on exam. He has chronic lower extremity edema due to a prior injury. Do not suspect dyspnea is due to CHF but will check BNP. - Patient denies missing any doses of Eliquis so low suspicion for PE. - Will also check CBC, BMET, and chest x-ray. - Dr. SkMarlou Porchecommended repeat right/left catheterization.  The patient understands that risks include but are not limited to stroke (1 in 1000), death (1 in 1084 kidney failure [usually temporary] (1 in 500), bleeding (1 in 200), allergic reaction [possibly serious] (1 in 200), and agrees to proceed. Given CKD stage IV, may need arrive early for pre-hydration. Procedure currently scheduled for Friday 05/22/2019 at 10:30am with Dr. EnSaunders RevelPatient instructed to arrive at 5:30am for fluids. However, if renal function has improved and GFR is > 45, will not need pre-hydration.  CAD - History of atherectomy/DES to RCA and LAD in 2018.  - EKG shows atrial-paced rhythm with 1st degree AV block, incomplete RBBB, and LAFB but no acute ST/T changes. - No chest pain but  shortness of breath could be anginal equivalent. - No longer on Aspirin or Plavix but on lifelong Eliquis.   Moderate Aortic Stenosis - Last Echo from 01/2019 showed moderate aortic stenosis with mean gradient of 16.7 mmHg, peak gradient of 29.4 mmHg, and aortic valve area of 1.50 cm^2.  Also showed mild to moderate aortic insufficiency. See full report above. - Right/left heart catheterization as above.  Sick Sinus Syndrome s/p PPM - PPM placed in 2019 in [place] after patient was found to have resting heart rate in the 20's.  - Last device check was in 03/2019 and showed one episode of NSVT that was supraventricular in nature.  - EKG shows atrial-paced rhythm with 1st degree AV block, incomplete RBBB, and LAFB but no acute ST/T changes. - Followed by Dr. Curt Bears here.   Dilated Aortic Root - Previously noted to have a mildly dilated aortic root of 4 cm on Echo in 10/2016 and again in 01/2019. - Patient states father died suddenly from a heart attack but never had an autopsy. Wonder if this could have been aortic aneurysm. - Discussed aneurysm precautions including BP control, avoiding flouroquinolones, and avoiding Valsalva.  - Would benefit from CTA/MRA in the future for further evaluation but will need to be mindful of renal function. Can discuss this after cardiac catheterization.  Prior PE/DVT - Found to have subsegmental pulmonary emboli in his left lower lobe with small clot burden and chronic bilateral DVTs in 2018. - On life-long Eliquis 2.9m twice daily (reduced dose due to age >>28and creatinine > 1.5). Discussed with Pharmacy who recommended holding Eliquis for 1-2 days prior to procedure. Have advised patient to hold 2 days prior to procedure. Will clarify with Dr. ESaunders Revelwho will perform cardiac catheterization.  Hypertension - BP well controlled today at 118/72.  - Continue Lopressor 270mtwice daily.  Hyperlipidemia - Most recent lipid panel from 01/2019: Total Cholesterol  123, Triglycerides 116, HDL 34, LDL 66. - At LDL goal of <70. - Continue Lipitor 4062maily.  CKD Stage IV - Most recent creatinine 1.76. - Not on ACEi/ARB or diuretic at home. - Will recheck BMET today.  Current medicines are reviewed with the patient today.  The patient does not have concerns regarding medicines other than what has been noted above.  The following changes have been made:  See above.  Labs/ tests ordered today include:    Orders Placed This Encounter  Procedures  . DG Chest 2 View  . Pro b natriuretic peptide  . Basic Metabolic Panel (BMET)  . CBC w/Diff  . EKG 12-Lead     Disposition:  Follow-up with APP following cardiac catheterization.   Patient is agreeable to this plan and will call if any problems develop in the interim.   Signed: CalDarreld McleanA-C  05/18/2019 3:08 PM  [facility] [id number]iRoyse CityePiney MountainC  [id number]: [phone]: [phone]

## 2019-05-18 ENCOUNTER — Other Ambulatory Visit: Payer: Self-pay

## 2019-05-18 ENCOUNTER — Encounter: Payer: Self-pay | Admitting: Student

## 2019-05-18 ENCOUNTER — Other Ambulatory Visit (HOSPITAL_COMMUNITY)
Admission: RE | Admit: 2019-05-18 | Discharge: 2019-05-18 | Disposition: A | Discharge status: Home / Self Care | Payer: Medicare Other | Source: Ambulatory Visit | Attending: Cardiology | Admitting: Cardiology

## 2019-05-18 ENCOUNTER — Ambulatory Visit (INDEPENDENT_AMBULATORY_CARE_PROVIDER_SITE_OTHER): Payer: Medicare Other | Admitting: Student

## 2019-05-18 VITALS — BP 118/72 | HR 62 | Ht 75.0 in | Wt 252.1 lb

## 2019-05-18 DIAGNOSIS — E785 Hyperlipidemia, unspecified: Secondary | ICD-10-CM

## 2019-05-18 DIAGNOSIS — Z20828 Contact with and (suspected) exposure to other viral communicable diseases: Secondary | ICD-10-CM | POA: Insufficient documentation

## 2019-05-18 DIAGNOSIS — I77810 Thoracic aortic ectasia: Secondary | ICD-10-CM | POA: Diagnosis not present

## 2019-05-18 DIAGNOSIS — Z01812 Encounter for preprocedural laboratory examination: Secondary | ICD-10-CM | POA: Insufficient documentation

## 2019-05-18 DIAGNOSIS — I10 Essential (primary) hypertension: Secondary | ICD-10-CM | POA: Diagnosis not present

## 2019-05-18 DIAGNOSIS — R0602 Shortness of breath: Secondary | ICD-10-CM

## 2019-05-18 DIAGNOSIS — Z86711 Personal history of pulmonary embolism: Secondary | ICD-10-CM

## 2019-05-18 DIAGNOSIS — N184 Chronic kidney disease, stage 4 (severe): Secondary | ICD-10-CM | POA: Diagnosis not present

## 2019-05-18 DIAGNOSIS — I25119 Atherosclerotic heart disease of native coronary artery with unspecified angina pectoris: Secondary | ICD-10-CM

## 2019-05-18 DIAGNOSIS — I495 Sick sinus syndrome: Secondary | ICD-10-CM | POA: Diagnosis not present

## 2019-05-18 DIAGNOSIS — I350 Nonrheumatic aortic (valve) stenosis: Secondary | ICD-10-CM | POA: Diagnosis not present

## 2019-05-18 DIAGNOSIS — Z86718 Personal history of other venous thrombosis and embolism: Secondary | ICD-10-CM | POA: Diagnosis not present

## 2019-05-18 NOTE — Patient Instructions (Addendum)
Medication Instructions:  Your physician recommends that you continue on your current medications as directed. Please refer to the Current Medication list given to you today.   Labwork: Your physician recommends that you have lab work today: pro BNP/BMET/CBC   Testing/Procedures: Your Pre-procedure COVID-19 Testing will be done on Tuesday, October 6 @ 10:50 am  at [facility] at [address], [place], [place] [address]. Once you arrive at the testing site, stay in the right hand lane, go under the building overhang not the tent. If you are tested under the tent your results may not be back before your procedure. Please be on time for your appointment.  After your swab you will be given a mask to wear and instructed to go home and quarantine/no visitors until after your procedure. If you test positive you will be notified and your procedure will be cancelled.   A chest x-ray takes a picture of the organs and structures inside the chest, including the heart, lungs, and blood vessels. This test can show several things, including, whether the heart is enlarges; whether fluid is building up in the lungs; and whether pacemaker / defibrillator leads are still in place.    Follow-Up: Your physician recommends that you keep your scheduled  follow-up appointment in: two weeks on 10/19 @ 11:30 am  with Kathrene Alu.    Any Other Special Instructions Will Be Listed Below (If Applicable).     If you need a refill on your cardiac medications before your next appointment, please call your pharmacy.  Your provider has recommended a cardiac catherization.   Prior to your catheterization, you will need COVID screening. You are scheduled for your screening on Tuesday, October 6 at 10:50 am . The testing center is located at the [employer] at [employer]. It closes at 3:15 PM. Stay in the RIGHT lane and tell them you are there for "pre-procedure  screening".   You are scheduled for a cardiac catheterization on Friday, October 9 with Dr. Saunders Revel or associate.  Please arrive at the [facility] (Main Entrance) at [facility] at [address], [place] Stay on Friday, October 9 at 5:30 am .  Free valet parking is available.   You are allowed only one visitor in the hospital with you. Both you and your visitor must wear masks.    Special note: Every effort is made to have your procedure done on time.   Please understand that emergencies sometimes delay a scheduled   procedure.  No solid foods after midnight on Thursday, October 8. You may have clear liquids until 5 am on the day of your procedure. .   You may take your morning medications with a sip of water on the day of your procedure.  Please take a baby aspirin (81 mg) on the morning of your procedure.   Medications to HOLD - Eliquis two day prior to catherization.   Plan for a one night stay -- bring personal belongings.  Bring a current list of your medications and current insurance cards.  You MUST have a responsible person to drive you home. Someone MUST be with you the first 24 hours after you arrive home or your discharge will be delayed. Wear clothes that are easy to get on and off and wear slip on shoes.    Coronary Angiogram A coronary angiogram, also called coronary angiography, is an X-ray procedure used to  look at the arteries in the heart. In this procedure, a dye (contrast dye) is injected through a long, hollow tube (catheter). The catheter is about the size of a piece of cooked spaghetti and is inserted through your groin, wrist, or arm. The dye is injected into each artery, and X-rays are then taken to show if there is a blockage in the arteries of your heart.  LET [facility] CARE PROVIDER KNOW ABOUT:  Any allergies you have, including allergies to shellfish or contrast dye.    All medicines you are taking, including vitamins, herbs,  eye drops, creams, and over-the-counter medicines.    Previous problems you or members of your family have had with the use of anesthetics.    Any blood disorders you have.    Previous surgeries you have had.  History of kidney problems or failure.    Other medical conditions you have.  RISKS AND COMPLICATIONS  Generally, a coronary angiogram is a safe procedure. However, about 1 person out of 1000 can have problems that may include:  Allergic reaction to the dye.  Bleeding/bruising from the access site or other locations.  Kidney injury, especially in people with impaired kidney function.   Stroke (rare).  Heart attack (rare).  Irregular rhythms (rare)  Death (rare)  BEFORE THE PROCEDURE   Do not eat or drink anything after midnight the night before the procedure or as directed by your health care provider.    Ask your health care provider about changing or stopping your regular medicines. This is especially important if you are taking diabetes medicines or blood thinners.  PROCEDURE  You may be given a medicine to help you relax (sedative) before the procedure. This medicine is given through an intravenous (IV) access tube that is inserted into one of your veins.    The area where the catheter will be inserted will be washed and shaved. This is usually done in the groin but may be done in the fold of your arm (near your elbow) or in the wrist.     A medicine will be given to numb the area where the catheter will be inserted (local anesthetic).    The health care provider will insert the catheter into an artery. The catheter will be guided by using a special type of X-ray (fluoroscopy) of the blood vessel being examined.    A special dye will then be injected into the catheter, and X-rays will be taken. The dye will help to show where any narrowing or blockages are located in the heart arteries.     AFTER THE PROCEDURE   If the procedure is done through the leg, you  will be kept in bed lying flat for several hours. You will be instructed to not bend or cross your legs.  The insertion site will be checked frequently.    The pulse in your feet or wrist will be checked frequently.    Additional blood tests, X-rays, and an electrocardiogram may be done.

## 2019-05-19 ENCOUNTER — Telehealth: Payer: Self-pay

## 2019-05-19 ENCOUNTER — Ambulatory Visit
Admission: RE | Admit: 2019-05-19 | Discharge: 2019-05-19 | Disposition: A | Discharge status: Home / Self Care | Payer: Medicare Other | Source: Ambulatory Visit | Attending: Student | Admitting: Student

## 2019-05-19 ENCOUNTER — Telehealth: Payer: Self-pay | Admitting: *Deleted

## 2019-05-19 ENCOUNTER — Other Ambulatory Visit (HOSPITAL_COMMUNITY): Payer: Medicare Other

## 2019-05-19 DIAGNOSIS — I25119 Atherosclerotic heart disease of native coronary artery with unspecified angina pectoris: Secondary | ICD-10-CM

## 2019-05-19 DIAGNOSIS — I350 Nonrheumatic aortic (valve) stenosis: Secondary | ICD-10-CM

## 2019-05-19 DIAGNOSIS — R05 Cough: Secondary | ICD-10-CM | POA: Diagnosis not present

## 2019-05-19 DIAGNOSIS — R0602 Shortness of breath: Secondary | ICD-10-CM | POA: Diagnosis not present

## 2019-05-19 DIAGNOSIS — N184 Chronic kidney disease, stage 4 (severe): Secondary | ICD-10-CM

## 2019-05-19 LAB — CBC WITH DIFFERENTIAL/PLATELET
Basophils Absolute: 0 10*3/uL (ref 0.0–0.2)
Basos: 1 %
EOS (ABSOLUTE): 0.4 10*3/uL (ref 0.0–0.4)
Eos: 5 %
Hematocrit: 45.2 % (ref 37.5–51.0)
Hemoglobin: 15.4 g/dL (ref 13.0–17.7)
Immature Grans (Abs): 0 10*3/uL (ref 0.0–0.1)
Immature Granulocytes: 1 %
Lymphocytes Absolute: 2.5 10*3/uL (ref 0.7–3.1)
Lymphs: 28 %
MCH: 31.2 pg (ref 26.6–33.0)
MCHC: 34.1 g/dL (ref 31.5–35.7)
MCV: 92 fL (ref 79–97)
Monocytes Absolute: 0.9 10*3/uL (ref 0.1–0.9)
Monocytes: 11 %
Neutrophils Absolute: 4.9 10*3/uL (ref 1.4–7.0)
Neutrophils: 54 %
Platelets: 168 10*3/uL (ref 150–450)
RBC: 4.93 x10E6/uL (ref 4.14–5.80)
RDW: 13.2 % (ref 11.6–15.4)
WBC: 8.9 10*3/uL (ref 3.4–10.8)

## 2019-05-19 LAB — NOVEL CORONAVIRUS, NAA (HOSP ORDER, SEND-OUT TO REF LAB; TAT 18-24 HRS): SARS-CoV-2, NAA: NOT DETECTED

## 2019-05-19 LAB — BASIC METABOLIC PANEL
BUN/Creatinine Ratio: 18 (ref 10–24)
BUN: 30 mg/dL — ABNORMAL HIGH (ref 8–27)
CO2: 25 mmol/L (ref 20–29)
Calcium: 9.4 mg/dL (ref 8.6–10.2)
Chloride: 105 mmol/L (ref 96–106)
Creatinine, Ser: 1.66 mg/dL — ABNORMAL HIGH (ref 0.76–1.27)
GFR calc Af Amer: 43 mL/min/{1.73_m2} — ABNORMAL LOW (ref >59)
GFR calc non Af Amer: 38 mL/min/{1.73_m2} — ABNORMAL LOW (ref >59)
Glucose: 74 mg/dL (ref 65–99)
Potassium: 4.5 mmol/L (ref 3.5–5.2)
Sodium: 142 mmol/L (ref 134–144)

## 2019-05-19 LAB — PRO B NATRIURETIC PEPTIDE: NT-Pro BNP: 181 pg/mL (ref 0–486)

## 2019-05-19 NOTE — Telephone Encounter (Signed)
Pt was supposed to have his Chest XRay on yesterday, 05/18/2019 and Covid test today, 05/19/2019, but did the opposite. Placed call to pt to let him know that he will need to go in the morning, 05/20/2019 to get re-tested for his procedure on Friday.  Pt verbalized understanding.

## 2019-05-19 NOTE — Telephone Encounter (Signed)
Spoke with the patient regarding the instructions from [place], [place]. He verbalized understanding.

## 2019-05-19 NOTE — Telephone Encounter (Signed)
-----   Message from Darreld Mclean, [place] sent at 05/19/2019 12:50 PM EDT ----- Please notify patient of results: Pre-cath labs look good overall. Kidney function slightly better from last labs in 01/2019; however, he will still need need to come in early (5:30am) for hydration prior to cath on Friday morning 05/22/2019.   I also confirmed with Dr. Saunders Revel who will be performing cath that he needs to hold Eliquis 2 days prior to cath like dicussed at visit yesterday.  Please re-emphasize that he will need to be NPO at midnight day of procedure.   Thank you!

## 2019-05-20 ENCOUNTER — Other Ambulatory Visit (HOSPITAL_COMMUNITY)
Admission: RE | Admit: 2019-05-20 | Discharge: 2019-05-20 | Disposition: A | Discharge status: Home / Self Care | Payer: Medicare Other | Source: Ambulatory Visit | Attending: Internal Medicine | Admitting: Internal Medicine

## 2019-05-20 DIAGNOSIS — Z01812 Encounter for preprocedural laboratory examination: Secondary | ICD-10-CM | POA: Diagnosis not present

## 2019-05-20 DIAGNOSIS — Z20828 Contact with and (suspected) exposure to other viral communicable diseases: Secondary | ICD-10-CM | POA: Diagnosis not present

## 2019-05-21 ENCOUNTER — Telehealth: Payer: Self-pay | Admitting: *Deleted

## 2019-05-21 LAB — SARS CORONAVIRUS 2 (TAT 6-24 HRS): SARS Coronavirus 2: NEGATIVE

## 2019-05-21 NOTE — Telephone Encounter (Signed)
Per pt call retuning this office call please give him a call back.

## 2019-05-21 NOTE — Telephone Encounter (Addendum)
Pt contacted pre-catheterization scheduled at [facility] for: Friday May 22, 2019 10:30 AM Verified arrival time and place: [place] [facility]) at: 5:30 AM-pre procedure hydration   No solid food after midnight prior to cath, clear liquids until 5 AM day of procedure. Contrast allergy: no  Hold: Eliquis-none 05/20/19 until post procedure  Except hold medications AM meds can be  taken pre-cath with sip of water including: ASA 81 mg   Confirmed patient has responsible adult to drive home post procedure and observe 24 hours after arriving home: yes  Currently, due to Covid-19 pandemic, only one support person will be allowed with patient. Must be the same support person for that patient's entire stay, will be screened and required to wear a mask. They will be asked to wait in the waiting room for the duration of the patient's stay.  Patients are required to wear a mask when they enter the hospital.      COVID-19 Pre-Screening Questions:  . In the past 7 to 10 days have you had a cough,  shortness of breath, headache, congestion, fever (100 or greater) body aches, chills, sore throat, or sudden loss of taste or sense of smell? Shortness of breath . Have you been around anyone with known Covid 19? no . Have you been around anyone who is awaiting Covid 19 test results in the past 7 to 10 days? no . Have you been around anyone who has been exposed to Covid 19, or has mentioned symptoms of Covid 19 within the past 7 to 10 days?no I reviewed procedure/mask/visitor instructions , Covid-19 screening questions with patient, he verbalized understanding, thanked me for call.

## 2019-05-22 ENCOUNTER — Ambulatory Visit (HOSPITAL_COMMUNITY)
Admission: RE | Admit: 2019-05-22 | Discharge: 2019-05-22 | Disposition: A | Discharge status: Home / Self Care | Payer: Medicare Other | Attending: Internal Medicine | Admitting: Internal Medicine

## 2019-05-22 ENCOUNTER — Encounter (HOSPITAL_COMMUNITY)
Admission: RE | Disposition: A | Discharge status: Home / Self Care | Payer: Self-pay | Source: Home / Self Care | Attending: Internal Medicine

## 2019-05-22 ENCOUNTER — Other Ambulatory Visit: Payer: Self-pay | Admitting: Student

## 2019-05-22 DIAGNOSIS — I2510 Atherosclerotic heart disease of native coronary artery without angina pectoris: Secondary | ICD-10-CM | POA: Diagnosis not present

## 2019-05-22 DIAGNOSIS — E785 Hyperlipidemia, unspecified: Secondary | ICD-10-CM | POA: Insufficient documentation

## 2019-05-22 DIAGNOSIS — I495 Sick sinus syndrome: Secondary | ICD-10-CM | POA: Diagnosis not present

## 2019-05-22 DIAGNOSIS — Z6831 Body mass index (BMI) 31.0-31.9, adult: Secondary | ICD-10-CM | POA: Insufficient documentation

## 2019-05-22 DIAGNOSIS — Z950 Presence of cardiac pacemaker: Secondary | ICD-10-CM | POA: Insufficient documentation

## 2019-05-22 DIAGNOSIS — Z79899 Other long term (current) drug therapy: Secondary | ICD-10-CM | POA: Diagnosis not present

## 2019-05-22 DIAGNOSIS — Z86718 Personal history of other venous thrombosis and embolism: Secondary | ICD-10-CM | POA: Diagnosis not present

## 2019-05-22 DIAGNOSIS — I252 Old myocardial infarction: Secondary | ICD-10-CM | POA: Diagnosis not present

## 2019-05-22 DIAGNOSIS — E669 Obesity, unspecified: Secondary | ICD-10-CM | POA: Insufficient documentation

## 2019-05-22 DIAGNOSIS — I1310 Hypertensive heart and chronic kidney disease without heart failure, with stage 1 through stage 4 chronic kidney disease, or unspecified chronic kidney disease: Secondary | ICD-10-CM | POA: Insufficient documentation

## 2019-05-22 DIAGNOSIS — Z7901 Long term (current) use of anticoagulants: Secondary | ICD-10-CM | POA: Insufficient documentation

## 2019-05-22 DIAGNOSIS — N184 Chronic kidney disease, stage 4 (severe): Secondary | ICD-10-CM | POA: Insufficient documentation

## 2019-05-22 DIAGNOSIS — Z86711 Personal history of pulmonary embolism: Secondary | ICD-10-CM | POA: Insufficient documentation

## 2019-05-22 DIAGNOSIS — Z888 Allergy status to other drugs, medicaments and biological substances status: Secondary | ICD-10-CM | POA: Insufficient documentation

## 2019-05-22 DIAGNOSIS — Z880 Allergy status to penicillin: Secondary | ICD-10-CM | POA: Insufficient documentation

## 2019-05-22 DIAGNOSIS — I352 Nonrheumatic aortic (valve) stenosis with insufficiency: Secondary | ICD-10-CM | POA: Diagnosis not present

## 2019-05-22 DIAGNOSIS — I77819 Aortic ectasia, unspecified site: Secondary | ICD-10-CM | POA: Insufficient documentation

## 2019-05-22 DIAGNOSIS — R0602 Shortness of breath: Secondary | ICD-10-CM | POA: Diagnosis present

## 2019-05-22 DIAGNOSIS — M1990 Unspecified osteoarthritis, unspecified site: Secondary | ICD-10-CM | POA: Diagnosis not present

## 2019-05-22 HISTORY — PX: RIGHT/LEFT HEART CATH AND CORONARY ANGIOGRAPHY: CATH118266

## 2019-05-22 HISTORY — PX: INTRAVASCULAR PRESSURE WIRE/FFR STUDY: CATH118243

## 2019-05-22 LAB — POCT I-STAT EG7
Bicarbonate: 25.8 mmol/L (ref 20.0–28.0)
Calcium, Ion: 1.27 mmol/L (ref 1.15–1.40)
HCT: 43 % (ref 39.0–52.0)
Hemoglobin: 14.6 g/dL (ref 13.0–17.0)
O2 Saturation: 66 %
Potassium: 4.2 mmol/L (ref 3.5–5.1)
Sodium: 142 mmol/L (ref 135–145)
TCO2: 27 mmol/L (ref 22–32)
pCO2, Ven: 44.6 mmHg (ref 44.0–60.0)
pH, Ven: 7.37 (ref 7.250–7.430)
pO2, Ven: 36 mmHg (ref 32.0–45.0)

## 2019-05-22 LAB — POCT ACTIVATED CLOTTING TIME
Activated Clotting Time: 208 seconds
Activated Clotting Time: 329 seconds

## 2019-05-22 SURGERY — RIGHT/LEFT HEART CATH AND CORONARY ANGIOGRAPHY
Anesthesia: LOCAL

## 2019-05-22 MED ORDER — SODIUM CHLORIDE 0.9 % IV SOLN: 250.0000 mL | INTRAVENOUS | Status: DC | PRN

## 2019-05-22 MED ORDER — SODIUM CHLORIDE 0.9% FLUSH: 3.0000 mL | Freq: Two times a day (BID) | INTRAVENOUS | Status: DC

## 2019-05-22 MED ORDER — HEPARIN (PORCINE) IN NACL 1000-0.9 UT/500ML-% IV SOLN
INTRAVENOUS | Status: AC
  Filled 2019-05-22: qty 1000

## 2019-05-22 MED ORDER — SODIUM CHLORIDE 0.9% FLUSH: 3.0000 mL | INTRAVENOUS | Status: DC | PRN

## 2019-05-22 MED ORDER — IOHEXOL 350 MG/ML SOLN
INTRAVENOUS | Status: DC | PRN
  Administered 2019-05-22: 12:00:00 40 mL via INTRA_ARTERIAL

## 2019-05-22 MED ORDER — ASPIRIN 81 MG PO CHEW: 81.0000 mg | CHEWABLE_TABLET | ORAL | Status: DC

## 2019-05-22 MED ORDER — HEPARIN SODIUM (PORCINE) 1000 UNIT/ML IJ SOLN
INTRAMUSCULAR | Status: DC | PRN
  Administered 2019-05-22: 5000 [IU] via INTRAVENOUS
  Administered 2019-05-22: 7000 [IU] via INTRAVENOUS

## 2019-05-22 MED ORDER — SODIUM CHLORIDE 0.9 % IV SOLN: INTRAVENOUS | Status: DC

## 2019-05-22 MED ORDER — MIDAZOLAM HCL 2 MG/2ML IJ SOLN
INTRAMUSCULAR | Status: AC
  Filled 2019-05-22: qty 2

## 2019-05-22 MED ORDER — HEPARIN SODIUM (PORCINE) 1000 UNIT/ML IJ SOLN
INTRAMUSCULAR | Status: AC
  Filled 2019-05-22: qty 1

## 2019-05-22 MED ORDER — MIDAZOLAM HCL 2 MG/2ML IJ SOLN
INTRAMUSCULAR | Status: DC | PRN
  Administered 2019-05-22: 0.5 mg via INTRAVENOUS

## 2019-05-22 MED ORDER — ONDANSETRON HCL 4 MG/2ML IJ SOLN: 4.0000 mg | Freq: Four times a day (QID) | INTRAMUSCULAR | Status: DC | PRN

## 2019-05-22 MED ORDER — FENTANYL CITRATE (PF) 100 MCG/2ML IJ SOLN
INTRAMUSCULAR | Status: AC
  Filled 2019-05-22: qty 2

## 2019-05-22 MED ORDER — ISOSORBIDE MONONITRATE ER 30 MG PO TB24: 30.0000 mg | ORAL_TABLET | Freq: Every day | ORAL | 11 refills | Status: DC

## 2019-05-22 MED ORDER — VERAPAMIL HCL 2.5 MG/ML IV SOLN
INTRAVENOUS | Status: DC | PRN
  Administered 2019-05-22: 10 mL via INTRA_ARTERIAL

## 2019-05-22 MED ORDER — ACETAMINOPHEN 325 MG PO TABS: 650.0000 mg | ORAL_TABLET | ORAL | Status: DC | PRN

## 2019-05-22 MED ORDER — LABETALOL HCL 5 MG/ML IV SOLN: 10.0000 mg | INTRAVENOUS | Status: DC | PRN

## 2019-05-22 MED ORDER — VERAPAMIL HCL 2.5 MG/ML IV SOLN
INTRAVENOUS | Status: AC
  Filled 2019-05-22: qty 2

## 2019-05-22 MED ORDER — FENTANYL CITRATE (PF) 100 MCG/2ML IJ SOLN
INTRAMUSCULAR | Status: DC | PRN
  Administered 2019-05-22: 12.5 ug via INTRAVENOUS

## 2019-05-22 MED ORDER — HEPARIN (PORCINE) IN NACL 1000-0.9 UT/500ML-% IV SOLN
INTRAVENOUS | Status: DC | PRN
  Administered 2019-05-22 (×2): 500 mL

## 2019-05-22 MED ORDER — LIDOCAINE HCL (PF) 1 % IJ SOLN
INTRAMUSCULAR | Status: DC | PRN
  Administered 2019-05-22 (×2): 2 mL

## 2019-05-22 MED ORDER — LIDOCAINE HCL (PF) 1 % IJ SOLN
INTRAMUSCULAR | Status: AC
  Filled 2019-05-22: qty 30

## 2019-05-22 MED ORDER — SODIUM CHLORIDE 0.9 % IV SOLN
INTRAVENOUS | Status: AC
  Administered 2019-05-22: 06:00:00 via INTRAVENOUS

## 2019-05-22 MED ORDER — HYDRALAZINE HCL 20 MG/ML IJ SOLN: 10.0000 mg | INTRAMUSCULAR | Status: DC | PRN

## 2019-05-22 SURGICAL SUPPLY — 18 items
CATH 5FR JL3.5 JR4 ANG PIG MP (CATHETERS) ×2 IMPLANT
CATH BALLN WEDGE 5F 110CM (CATHETERS) ×2 IMPLANT
CATH LANGSTON DUAL LUM PIG 6FR (CATHETERS) ×2 IMPLANT
CATH VISTA GUIDE 6FR JR4 (CATHETERS) ×2 IMPLANT
DEVICE RAD COMP TR BAND LRG (VASCULAR PRODUCTS) ×2 IMPLANT
GLIDESHEATH SLEND SS 6F .021 (SHEATH) ×2 IMPLANT
GUIDEWIRE .025 260CM (WIRE) ×2 IMPLANT
GUIDEWIRE INQWIRE 1.5J.035X260 (WIRE) ×1 IMPLANT
GUIDEWIRE PRESSURE COMET II (WIRE) ×2 IMPLANT
INQWIRE 1.5J .035X260CM (WIRE) ×2
KIT ESSENTIALS PG (KITS) ×2 IMPLANT
KIT HEART LEFT (KITS) ×2 IMPLANT
PACK CARDIAC CATHETERIZATION (CUSTOM PROCEDURE TRAY) ×2 IMPLANT
SHEATH GLIDE SLENDER 4/5FR (SHEATH) ×2 IMPLANT
TRANSDUCER W/STOPCOCK (MISCELLANEOUS) ×4 IMPLANT
TUBING ART PRESS 72  MALE/FEM (TUBING) ×1
TUBING ART PRESS 72 MALE/FEM (TUBING) ×1 IMPLANT
TUBING CIL FLEX 10 FLL-RA (TUBING) ×2 IMPLANT

## 2019-05-22 NOTE — Progress Notes (Signed)
Site area: Right Brachial  Site Prior to Removal:  Level 0   Pressure Applied For 10 MINUTES    Minutes Beginning at 1420  Manual:  yes  Patient Status During Pull:  good    Post Pull Instructions Given: yes  Post Pull Pulses Present:  Yes   Dressing Applied: yes  Comments: Pt tolerated the procedure well

## 2019-05-22 NOTE — Interval H&P Note (Signed)
History and Physical Interval Note:  05/22/2019 10:30 AM  [name]  has presented today for cardiac catherization, with the diagnosis of shortness of breath.  The various methods of treatment have been discussed with the patient and family. After consideration of risks, benefits and other options for treatment, the patient has consented to  Procedure(s): RIGHT/LEFT HEART CATH AND CORONARY ANGIOGRAPHY (N/A) as a surgical intervention.  The patient's history has been reviewed, patient examined, no change in status, stable for surgery.  I have reviewed the patient's chart and labs.  Questions were answered to the patient's satisfaction.    Cath Lab Visit (complete for each Cath Lab visit)  Clinical Evaluation Leading to the Procedure:   ACS: No.  Non-ACS:    Anginal Classification: CCS III  Anti-ischemic medical therapy: Minimal Therapy (1 class of medications)  Non-Invasive Test Results: No non-invasive testing performed  Prior CABG: No previous CABG  Quilla Freeze

## 2019-05-22 NOTE — Discharge Instructions (Signed)
Radial Site Care ° °This sheet gives you information about how to care for yourself after your procedure. Your health care provider may also give you more specific instructions. If you have problems or questions, contact your health care provider. °What can I expect after the procedure? °After the procedure, it is common to have: °· Bruising and tenderness at the catheter insertion area. °Follow these instructions at home: °Medicines °· Take over-the-counter and prescription medicines only as told by your health care provider. °Insertion site care °· Follow instructions from your health care provider about how to take care of your insertion site. Make sure you: °? Wash your hands with soap and water before you change your bandage (dressing). If soap and water are not available, use hand sanitizer. °? Change your dressing as told by your health care provider. °? Leave stitches (sutures), skin glue, or adhesive strips in place. These skin closures may need to stay in place for 2 weeks or longer. If adhesive strip edges start to loosen and curl up, you may trim the loose edges. Do not remove adhesive strips completely unless your health care provider tells you to do that. °· Check your insertion site every day for signs of infection. Check for: °? Redness, swelling, or pain. °? Fluid or blood. °? Pus or a bad smell. °? Warmth. °· Do not take baths, swim, or use a hot tub until your health care provider approves. °· You may shower 24-48 hours after the procedure, or as directed by your health care provider. °? Remove the dressing and gently wash the site with plain soap and water. °? Pat the area dry with a clean towel. °? Do not rub the site. That could cause bleeding. °· Do not apply powder or lotion to the site. °Activity ° °· For 24 hours after the procedure, or as directed by your health care provider: °? Do not flex or bend the affected arm. °? Do not push or pull heavy objects with the affected arm. °? Do not  drive yourself home from the hospital or clinic. You may drive 24 hours after the procedure unless your health care provider tells you not to. °? Do not operate machinery or power tools. °· Do not lift anything that is heavier than 10 lb (4.5 kg), or the limit that you are told, until your health care provider says that it is safe. °· Ask your health care provider when it is okay to: °? Return to work or school. °? Resume usual physical activities or sports. °? Resume sexual activity. °General instructions °· If the catheter site starts to bleed, raise your arm and put firm pressure on the site. If the bleeding does not stop, get help right away. This is a medical emergency. °· If you went home on the same day as your procedure, a responsible adult should be with you for the first 24 hours after you arrive home. °· Keep all follow-up visits as told by your health care provider. This is important. °Contact a health care provider if: °· You have a fever. °· You have redness, swelling, or yellow drainage around your insertion site. °Get help right away if: °· You have unusual pain at the radial site. °· The catheter insertion area swells very fast. °· The insertion area is bleeding, and the bleeding does not stop when you hold steady pressure on the area. °· Your arm or hand becomes pale, cool, tingly, or numb. °These symptoms may represent a serious problem   that is an emergency. Do not wait to see if the symptoms will go away. Get medical help right away. Call your local emergency services (911 in the [place] Do not drive yourself to the hospital. °Summary °· After the procedure, it is common to have bruising and tenderness at the site. °· Follow instructions from your health care provider about how to take care of your radial site wound. Check the wound every day for signs of infection. °· Do not lift anything that is heavier than 10 lb (4.5 kg), or the limit that you are told, until your health care provider says  that it is safe. °This information is not intended to replace advice given to you by your health care provider. Make sure you discuss any questions you have with your health care provider. °Document Released: 09/01/2010 Document Revised: 09/04/2017 Document Reviewed: 09/04/2017 °Elsevier Patient Education © 2020 [employer] Inc[employer] ° °

## 2019-05-25 ENCOUNTER — Encounter (HOSPITAL_COMMUNITY): Payer: Self-pay | Admitting: Internal Medicine

## 2019-05-25 ENCOUNTER — Telehealth: Payer: Self-pay | Admitting: Cardiology

## 2019-05-25 MED FILL — Heparin Sodium (Porcine) Inj 1000 Unit/ML: INTRAMUSCULAR | Qty: 10 | Status: AC

## 2019-05-25 NOTE — Telephone Encounter (Signed)
Spoke with pt and pt had cath on Friday and based on findings decided would treat  medically Pt was started on Isosorbide 30 mg every day Pt developed severe h/a and has since stopped med Will forward to Dr Marlou Porch for review and recommendations./cy

## 2019-05-25 NOTE — Telephone Encounter (Signed)
New message   Patient has post cath questions. Please call to discuss.

## 2019-05-26 NOTE — Telephone Encounter (Signed)
Follow Up   Patient following up after yesterday's call. Please give patient a call back.

## 2019-05-26 NOTE — Telephone Encounter (Signed)
Stop Imdur Start Ranexa 500mg  PO BID Thanks [name], MD

## 2019-05-27 MED ORDER — RANOLAZINE ER 500 MG PO TB12: 500.0000 mg | ORAL_TABLET | Freq: Two times a day (BID) | ORAL | 11 refills | Status: DC

## 2019-05-27 NOTE — Telephone Encounter (Signed)
Lm to call back ./cy 

## 2019-05-27 NOTE — Telephone Encounter (Signed)
Pt aware of recommendations and script sent via epic to [facility] at pt's request./cy

## 2019-05-28 NOTE — Progress Notes (Signed)
CARDIOLOGY OFFICE NOTE  Date:  06/02/2019    [name] Date of Birth: Mar 14, 1934 Medical Record #[id number]  PCP:  Marin Olp, MD  Cardiologist:  Skains/Camnitz   Chief Complaint  Patient presents with  . Follow-up    Post cath visit - seen for Dr. Charlyne Mom    History of Present Illness: [name] is a 85 y.o. male who presents today for a follow up/post cath visit. Seen for Dr. Marlou Porch.   He has a history of known CAD s/p atherectomy/DES to RCA and LAD in 11/2016, sick sinus syndrome s/p Medtronic dual-chamber PPM in 2019, moderate aortic stenosis on Echo in 01/2019, PE and bilateral chronic DVTs in 2018, hypertension, hyperlipidemia, CKD stage IV, and chronic low back pain.   Patient was first seen by Dr. Marlou Porch in 10/2016 for further evaluation of sudden onset of shortness of breath and abnormal nuclear stress test that showed signs of prior MI. Right/left heart catheterization was performed in 11/2016 and showed severe calcified 2 vessel CAD with 70% of the proximal LAD and 80% stenosis of the proximal RCA. Given amount of calcification, staged atherectomy/PCI was recommended and patient was brought back a week later for this procedure. He continued to have shortness of breath following this procedure. Chest CTA in 12/2016 showed subsegmental pulmonary emboli in his left lower lobe with small clot burden and lower extremity dopplers revealed bilateral chronic DVTs. He was treated with Eliquis. Patient travels to [place], [place] frequently and also follows with a Cardiologist (Dr. Josem Kaufmann) at [facility]. In 2019, he was in [place] and experienced dizziness. He was found to be bradycardic with resting heart rate of 24 bpm. He underwent placement of PPM. He follows with Dr. Curt Bears here for this.   Patient was last seen by Dr. Marlou Porch via a telehealth visit in 11/2018 and reported mild shortness of breath with exertion over the last  few months. Patient had repeat Echo in 01/2019 which showed LVEF of 60-65% with grade 1 diastolic dysfunction, moderate aortic stenosis, and mild to moderate aortic regurgitation.   His symptoms continued - he was seen by Sande Rives, PA back earlier this month and referred for left and right heart cath. Noted to have 60% ISR with FFR of 0/89  - to attempt medical management but consider intervention if fails on 2 agents. He was placed on Imdur - did not tolerate due to headache - was placed on Ranexa.   The patient does not have symptoms concerning for COVID-19 infection (fever, chills, cough, or new shortness of breath).   Comes in today. Here alone. He notes he feels better. He did not tolerate the Imdur. He is now on Ranexa - notes this was like a "magic pill". He can do more. Breathing is at least 50% better. No problems with his cath site. Tolerating his current regimen without issue. Asking where we go from here.   Past Medical History:  Diagnosis Date  . Arthritis    "back, knees" (11/21/2016)  . Bladder stones   . CAD (coronary artery disease)    11/20/16 PCI with DES--> LAD/RCA  . Chronic lower back pain   . CKD (chronic kidney disease), stage IV ([place])    [name] 11/08/2016  . History of gout   . History of kidney stones 12/02/2008  . Hyperlipidemia   . Hypertension   . Myocardial infarction [facility])    "silent" (11/21/2016)  . PSA, INCREASED 12/05/2009  .  UTI (urinary tract infection)     Past Surgical History:  Procedure Laterality Date  . CARDIAC CATHETERIZATION  11/14/2016  . CATARACT EXTRACTION W/ INTRAOCULAR LENS  IMPLANT, BILATERAL    . CORONARY ANGIOPLASTY WITH STENT PLACEMENT  11/21/2016   "2 stents"   . CORONARY ATHERECTOMY N/A 11/21/2016   Procedure: Coronary Atherectomy;  Surgeon: Leonie Man, MD;  Location: [place] [facility];  Service: Cardiovascular;  Laterality: N/A;  RCA and LAD  . CORONARY STENT INTERVENTION N/A 11/21/2016   Procedure: Coronary Stent  Intervention;  Surgeon: Leonie Man, MD;  Location: [place] [facility];  Service: Cardiovascular;  Laterality: N/A;  RCA and LAD  . CYSTOSCOPY W/ STONE MANIPULATION    . INTRAVASCULAR PRESSURE WIRE/FFR STUDY N/A 05/22/2019   Procedure: INTRAVASCULAR PRESSURE WIRE/FFR STUDY;  Surgeon: Nelva Bush, MD;  Location: [place] [facility];  Service: Cardiovascular;  Laterality: N/A;  . JOINT REPLACEMENT    . KNEE ARTHROSCOPY Left   . LAPAROSCOPIC CHOLECYSTECTOMY    . LEFT HEART CATH N/A 11/21/2016   Procedure: Left Heart Cath;  Surgeon: Leonie Man, MD;  Location: [place] [facility];  Service: Cardiovascular;  Laterality: N/A;  . LITHOTRIPSY    . PILONIDAL CYST EXCISION  56  . RIGHT/LEFT HEART CATH AND CORONARY ANGIOGRAPHY N/A 11/14/2016   Procedure: Right/Left Heart Cath and Coronary Angiography;  Surgeon: Leonie Man, MD;  Location: [place] [facility];  Service: Cardiovascular;  Laterality: N/A;  . RIGHT/LEFT HEART CATH AND CORONARY ANGIOGRAPHY N/A 05/22/2019   Procedure: RIGHT/LEFT HEART CATH AND CORONARY ANGIOGRAPHY;  Surgeon: Nelva Bush, MD;  Location: [place] [facility];  Service: Cardiovascular;  Laterality: N/A;  . TEMPORARY PACEMAKER N/A 11/21/2016   Procedure: Temporary Pacemaker;  Surgeon: Leonie Man, MD;  Location: [place] [facility];  Service: Cardiovascular;  Laterality: N/A;  . TONSILLECTOMY    . TOTAL KNEE ARTHROPLASTY Left 09/22/2012   Procedure: TOTAL KNEE ARTHROPLASTY;  Surgeon: Lorn Junes, MD;  Location: [place];  Service: Orthopedics;  Laterality: Left;     Medications: Current Meds  Medication Sig  . allopurinol (ZYLOPRIM) 300 MG tablet TAKE 1 TABLET DAILY  . atorvastatin (LIPITOR) 40 MG tablet TAKE 1 TABLET DAILY  . diphenhydramine-acetaminophen (TYLENOL PM) 25-500 MG TABS tablet Take 1 tablet by mouth at bedtime as needed (sleep).  [name] ELIQUIS 2.5 MG TABS tablet TAKE 1 TABLET TWICE A DAY  . metoprolol tartrate (LOPRESSOR) 25 MG tablet TAKE 1 TABLET  TWICE A DAY  . nitroGLYCERIN (NITROSTAT) 0.4 MG SL tablet Place 1 tablet (0.4 mg total) under the tongue every 5 (five) minutes as needed for chest pain.  . Tamsulosin HCl (FLOMAX) 0.4 MG CAPS Take 0.4 mg by mouth at bedtime.   . [DISCONTINUED] ranolazine (RANEXA) 500 MG 12 hr tablet Take 1 tablet (500 mg total) by mouth 2 (two) times daily.  . [DISCONTINUED] ranolazine (RANEXA) 500 MG 12 hr tablet Take 1 tablet (500 mg total) by mouth 2 (two) times daily.     Allergies: Allergies  Allergen Reactions  . Epinephrine Palpitations    "Rate races"  . Lisinopril     REACTION: cough  . Penicillins Hives    Has patient had a PCN reaction causing immediate rash, facial/tongue/throat swelling, SOB or lightheadedness with hypotension: No Has patient had a PCN reaction causing severe rash involving mucus membranes or skin necrosis: Yes Has patient had a PCN reaction that required hospitalization No Has patient had a PCN  reaction occurring within the last 10 years: No If all of the above answers are "NO", then may proceed with Cephalosporin use.     Social History: The patient  reports that he has never smoked. He has never used smokeless tobacco. He reports current alcohol use. He reports that he does not use drugs.   Family History: The patient's family history includes Breast cancer in his mother; Heart attack in his father.   Review of Systems: Please see the history of present illness.   All other systems are reviewed and negative.   Physical Exam: VS:  BP 128/78   Pulse 78   Ht _0  (1.905 m)   Wt 252 lb 1.9 oz (114.4 kg)   SpO2 95%   BMI 31.51 kg/m  .  BMI Body mass index is 31.51 kg/m.  Wt Readings from Last 3 Encounters:  06/02/19 252 lb 1.9 oz (114.4 kg)  05/22/19 250 lb (113.4 kg)  05/18/19 252 lb 1.9 oz (114.4 kg)    General: Pleasant. Alert and in no acute distress. Quite talkative. He is not short of breath with talking as I remember he was at his prior visit with  Callie.   HEENT: Normal.  Neck: Supple, no JVD, carotid bruits, or masses noted.  Cardiac: Regular rate and rhythm. Soft outflow murmur. No edema.  Respiratory:  Lungs are clear to auscultation bilaterally with normal work of breathing.  GI: Soft and nontender.  MS: No deformity or atrophy. Gait and ROM intact.  Skin: Warm and dry. Color is normal.  Neuro:  Strength and sensation are intact and no gross focal deficits noted.  Psych: Alert, appropriate and with normal affect.   LABORATORY DATA:  EKG:  EKG is ordered today. This demonstrates A paced with underlying NSR - QT is 398 ms.  Lab Results  Component Value Date   WBC 8.9 05/18/2019   HGB 14.6 05/22/2019   HCT 43.0 05/22/2019   PLT 168 05/18/2019   GLUCOSE 74 05/18/2019   CHOL 123 01/21/2019   TRIG 116.0 01/21/2019   HDL 34.20 (L) 01/21/2019   LDLDIRECT 79.0 12/08/2014   LDLCALC 66 01/21/2019   ALT 20 01/21/2019   AST 20 01/21/2019   NA 142 05/22/2019   K 4.2 05/22/2019   CL 105 05/18/2019   CREATININE 1.66 (H) 05/18/2019   BUN 30 (H) 05/18/2019   CO2 25 05/18/2019   TSH 1.77 10/18/2016   PSA 5.21 03/28/2017   INR 1.1 11/08/2016     BNP (last 3 results) No results for input(s): BNP in the last 8760 hours.  ProBNP (last 3 results) Recent Labs    05/18/19 1434  PROBNP 181     Other Studies Reviewed Today:  INTRAVASCULAR PRESSURE WIRE/FFR STUDY 05/2019  RIGHT/LEFT HEART CATH AND CORONARY ANGIOGRAPHY  Conclusion  Conclusions: 1. Significant single-vessel coronary artery disease with 60% in-stent restenosis of proximal RCA stent that is mildly abnormal by hemodynamic assessment (DFR 0.89; abnormal if less than 0.90). 2. Mild to moderate, non-obstructive coronary artery disease involving the LAD and LCx; LAD stent is widely patent. 3. Mildly elevated left heart, right heart, and pulmonary artery pressures. 4. Mild to moderate aortic valve stenosis. 5. Mildly reduced Fick cardiac output/index.   Recommendations: 1. Optimize medical therapy, including aggressive antianginal therapy.  We will add isosorbide mononitrate 30 mg daily, to be escalated as tolerated. 2. If symptoms persist/worsen despite maximal tolerated doses of at least two antianginal agents, PCI to proximal RCA in-stent restenosis  will need to be considered. 3. Aggressive secondary prevention. 4. Restart apixaban tomorrow morning if no evidence of bleeding or vascular injury at catheterization sites.  Nelva Bush, MD [facility]   Echocardiogram 02/03/2019: Impressions: 1. The left ventricle has normal systolic function with an ejection fraction of 60-65%. The cavity size was normal. There is moderately increased left ventricular wall thickness. Left ventricular diastolic Doppler parameters are consistent with impaired relaxation. Indeterminate filling pressures The E/e' is 8-15. There is abnormal septal motion consistent with RV pacemaker. 2. The right ventricle has normal systolic function. The cavity was normal. There is no increase in right ventricular wall thickness. 3. Left atrial size was mildly dilated. 4. Right atrial size was mildly dilated. 5. The mitral valve is abnormal. Mild thickening of the mitral valve leaflet. There is mild mitral annular calcification present. 6. The tricuspid valve is abnormal. Tricuspid valve regurgitation is moderate. 7. The aortic valve is tricuspid. Mild calcification of the aortic valve. Aortic valve regurgitation is mild by color flow Doppler. Moderate stenosis of the aortic valve. 8. When compared to the prior study: 11/05/17 EF 55-60%. Mild AS 29mHg mean, 250mg peak. Mild-moderate AI.  Summary: LVEF 60-65%, moderate LVH, incoordinate paced septal motion, grade 1 DD, indeterminate LV filling pressure, RV pacer, mild biatrial enlargement, moderate calcific aortic stenosis - mean gradient 17 mmHg (AVA 1.3 cm2), mild to moderate AI, MAC with trivial MR,  moderate TR, RVSP 43 mmHg, normal IVC.   Assessment/Plan:  1. S/P cardiac cath - see above - trying medical management - has not tolerated nitrate therapy - now on Ranexa - will increase his dose and see if we can get further improvement in his symptoms - otherwise, refer back for PCI attempt.   2. Shortness of breath - most likely an anginal equivalent -  improved with Ranexa. Did not tolerate Imdur. Increasing the Ranexa today to 1000 mg BID.   3. Moderate AS - s/p right heart cath - mild to moderate AS noted - will need to be followed.   4. Diastolic dysfunction   5. Known CAD with prior atherectomy/DES to RCA and LAD in 2018 - see recent cath findings - improved symptoms with Ranexa. Has not tolerated Imdur.   6. Underlying PPM for CHB/profound bradycardia - followed by EP.   7. Dilated aortic root - noted to be 4 cm by his last echo - will need to be followed and may benefit from further imaging - will need to watch his renal function - rechecking today. Can discuss further on return.   8. Prior PE/DVT - on life long therapy - no issues noted. He is on the lower dose of Eliquis due to age and creatinine.   9. HTN - BP is ok today.   10. HLD - on statin therapy  11. CKD  12. COVID-19 Education: The signs and symptoms of COVID-19 were discussed with the patient and how to seek care for testing (follow up with PCP or arrange E-visit).  The importance of social distancing, staying at home, hand hygiene and wearing a mask when out in public were discussed today.  Current medicines are reviewed with the patient today.  The patient does not have concerns regarding medicines other than what has been noted above.  The following changes have been made:  See above.  Labs/ tests ordered today include:    Orders Placed This Encounter  Procedures  . Basic metabolic panel  . EKG 12-Lead     Disposition:  FU with Dr. Marlou Porch in about 3 to 4 weeks for further discussion.     Patient is agreeable to this plan and will call if any problems develop in the interim.   SignedTruitt Merle, NP  06/02/2019 11:50 AM  [facility] [address] [place], [place]  [address] Phone: [phone] Fax: [phone]

## 2019-06-01 ENCOUNTER — Ambulatory Visit: Payer: Medicare Other | Admitting: Nurse Practitioner

## 2019-06-02 ENCOUNTER — Encounter: Payer: Self-pay | Admitting: Nurse Practitioner

## 2019-06-02 ENCOUNTER — Other Ambulatory Visit: Payer: Self-pay

## 2019-06-02 ENCOUNTER — Ambulatory Visit (INDEPENDENT_AMBULATORY_CARE_PROVIDER_SITE_OTHER): Payer: Medicare Other | Admitting: Nurse Practitioner

## 2019-06-02 VITALS — BP 128/78 | HR 78 | Ht 75.0 in | Wt 252.1 lb

## 2019-06-02 DIAGNOSIS — I25119 Atherosclerotic heart disease of native coronary artery with unspecified angina pectoris: Secondary | ICD-10-CM

## 2019-06-02 DIAGNOSIS — R0602 Shortness of breath: Secondary | ICD-10-CM | POA: Diagnosis not present

## 2019-06-02 MED ORDER — RANOLAZINE ER 1000 MG PO TB12: 500.0000 mg | ORAL_TABLET | Freq: Two times a day (BID) | ORAL | 3 refills | Status: DC

## 2019-06-02 MED ORDER — RANOLAZINE ER 500 MG PO TB12: 500.0000 mg | ORAL_TABLET | Freq: Two times a day (BID) | ORAL | 3 refills | Status: DC

## 2019-06-02 NOTE — Patient Instructions (Addendum)
After Visit Summary:  We will be checking the following labs today - BMET   Medication Instructions:    Continue with your current medicines. BUT  Increase the Ranexa to 1000 mg twice a day - ok to take 2 of the 500 mg tablets twice a day and use those up - I have sent the 1000 mg to your mail order.    If you need a refill on your cardiac medications before your next appointment, please call your pharmacy.     Testing/Procedures To Be Arranged:  N/A  Follow-Up:   See Dr. Marlou Porch in about 3 to 4 weeks     At [facility], you and your health needs are our priority.  As part of our continuing mission to provide you with exceptional heart care, we have created designated Provider Care Teams.  These Care Teams include your primary Cardiologist (physician) and Advanced Practice Providers (APPs -  Physician Assistants and Nurse Practitioners) who all work together to provide you with the care you need, when you need it.  Special Instructions:  . Stay safe, stay home, wash your hands for at least 20 seconds and wear a mask when out in public.  . It was good to talk with you today.    Call the [place] office at [phone] if you have any questions, problems or concerns.

## 2019-06-03 LAB — BASIC METABOLIC PANEL
BUN/Creatinine Ratio: 19 (ref 10–24)
BUN: 36 mg/dL — ABNORMAL HIGH (ref 8–27)
CO2: 22 mmol/L (ref 20–29)
Calcium: 9.5 mg/dL (ref 8.6–10.2)
Chloride: 106 mmol/L (ref 96–106)
Creatinine, Ser: 1.88 mg/dL — ABNORMAL HIGH (ref 0.76–1.27)
GFR calc Af Amer: 37 mL/min/{1.73_m2} — ABNORMAL LOW (ref >59)
GFR calc non Af Amer: 32 mL/min/{1.73_m2} — ABNORMAL LOW (ref >59)
Glucose: 114 mg/dL — ABNORMAL HIGH (ref 65–99)
Potassium: 4.6 mmol/L (ref 3.5–5.2)
Sodium: 139 mmol/L (ref 134–144)

## 2019-06-25 ENCOUNTER — Encounter: Payer: Self-pay | Admitting: Cardiology

## 2019-06-25 ENCOUNTER — Ambulatory Visit (INDEPENDENT_AMBULATORY_CARE_PROVIDER_SITE_OTHER): Payer: Medicare Other | Admitting: Cardiology

## 2019-06-25 ENCOUNTER — Other Ambulatory Visit: Payer: Self-pay

## 2019-06-25 VITALS — BP 120/60 | HR 91 | Ht 75.0 in | Wt 247.0 lb

## 2019-06-25 DIAGNOSIS — I25119 Atherosclerotic heart disease of native coronary artery with unspecified angina pectoris: Secondary | ICD-10-CM | POA: Diagnosis not present

## 2019-06-25 DIAGNOSIS — Z86718 Personal history of other venous thrombosis and embolism: Secondary | ICD-10-CM

## 2019-06-25 DIAGNOSIS — I350 Nonrheumatic aortic (valve) stenosis: Secondary | ICD-10-CM | POA: Diagnosis not present

## 2019-06-25 DIAGNOSIS — I77810 Thoracic aortic ectasia: Secondary | ICD-10-CM | POA: Diagnosis not present

## 2019-06-25 NOTE — Patient Instructions (Signed)
Medication Instructions:  The current medical regimen is effective;  continue present plan and medications.  *If you need a refill on your cardiac medications before your next appointment, please call your pharmacy*  Follow-Up: At [facility], you and your health needs are our priority.  As part of our continuing mission to provide you with exceptional heart care, we have created designated Provider Care Teams.  These Care Teams include your primary Cardiologist (physician) and Advanced Practice Providers (APPs -  Physician Assistants and Nurse Practitioners) who all work together to provide you with the care you need, when you need it.  Your next appointment:   12 month(s)  The format for your next appointment:   In Person  Provider:   Mark Skains, MD   Thank you for choosing [facility]!!     

## 2019-06-25 NOTE — Progress Notes (Signed)
Cardiology Office Note:    Date:  06/25/2019   ID:  Pattricia [name], DOB 09-23-1933, MRN [id number]  PCP:  Richard Olp, MD  Cardiologist:  Richard Furbish, MD  Electrophysiologist:  Richard Haw, MD   Referring MD: Richard Olp, MD     History of Present Illness:    [name] is a 85 y.o. male here for follow-up, CAD, prior MI, pacemaker SSS.  Cardiac catheterization revealed heavily calcified coronary artery disease and had atherectomy of RCA and LAD.  After this procedure he continued to have some shortness of breath and a PE CT was performed which revealed a subsegmental pulmonary emboli in his left lower lobe.  Small clot burden.  DVTs were noted as well bilaterally with cannulization of clot.  Eliquis was started and aspirin was discontinued at that time.  He was continued on Plavix.  He also travels to [place] quite frequently and has had seen Dr. Josem [name] cardiologist at [facility].  Did cardiac rehab there.  He experienced dizziness once while in [place] and a pacemaker was placed because of resting heart rate of 24 bpm.  Dr. Curt [name] now follows him as well here.  Wife is [name], retired Conservation officer, historic buildings for 30 years.  Non-smoker, no alcohol.  Right foot swelling at beach, lasix, abx. Celluitis in foot, Sr. Yong Channel  06/25/2019-here for the follow-up of moderate aortic stenosis, CAD.  Feeling very well.  Ranexa seems to be helping him.  Could not tolerate Imdur.  His son is at [place], hopefully going to law school.  Feels well without any fevers chills nausea vomiting syncope bleeding.  No myalgias.  Mildly dilated aortic root.  Past Medical History:  Diagnosis Date  . Arthritis    "back, knees" (11/21/2016)  . Bladder stones   . CAD (coronary artery disease)    11/20/16 PCI with DES--> LAD/RCA  . Chronic lower back pain   . CKD (chronic kidney disease), stage IV ([place])    [name] 11/08/2016  . History of gout   .  History of kidney stones 12/02/2008  . Hyperlipidemia   . Hypertension   . Myocardial infarction [facility])    "silent" (11/21/2016)  . PSA, INCREASED 12/05/2009  . UTI (urinary tract infection)     Past Surgical History:  Procedure Laterality Date  . CARDIAC CATHETERIZATION  11/14/2016  . CATARACT EXTRACTION W/ INTRAOCULAR LENS  IMPLANT, BILATERAL    . CORONARY ANGIOPLASTY WITH STENT PLACEMENT  11/21/2016   "2 stents"   . CORONARY ATHERECTOMY N/A 11/21/2016   Procedure: Coronary Atherectomy;  Surgeon: Leonie Man, MD;  Location: [place] [facility];  Service: Cardiovascular;  Laterality: N/A;  RCA and LAD  . CORONARY STENT INTERVENTION N/A 11/21/2016   Procedure: Coronary Stent Intervention;  Surgeon: Leonie Man, MD;  Location: [place] [facility];  Service: Cardiovascular;  Laterality: N/A;  RCA and LAD  . CYSTOSCOPY W/ STONE MANIPULATION    . INTRAVASCULAR PRESSURE WIRE/FFR STUDY N/A 05/22/2019   Procedure: INTRAVASCULAR PRESSURE WIRE/FFR STUDY;  Surgeon: Nelva Bush, MD;  Location: [place] [facility];  Service: Cardiovascular;  Laterality: N/A;  . JOINT REPLACEMENT    . KNEE ARTHROSCOPY Left   . LAPAROSCOPIC CHOLECYSTECTOMY    . LEFT HEART CATH N/A 11/21/2016   Procedure: Left Heart Cath;  Surgeon: Leonie Man, MD;  Location: [place] [facility];  Service: Cardiovascular;  Laterality: N/A;  . LITHOTRIPSY    . PILONIDAL CYST  EXCISION  56  . RIGHT/LEFT HEART CATH AND CORONARY ANGIOGRAPHY N/A 11/14/2016   Procedure: Right/Left Heart Cath and Coronary Angiography;  Surgeon: Leonie Man, MD;  Location: [place] [facility];  Service: Cardiovascular;  Laterality: N/A;  . RIGHT/LEFT HEART CATH AND CORONARY ANGIOGRAPHY N/A 05/22/2019   Procedure: RIGHT/LEFT HEART CATH AND CORONARY ANGIOGRAPHY;  Surgeon: Nelva Bush, MD;  Location: [place] [facility];  Service: Cardiovascular;  Laterality: N/A;  . TEMPORARY PACEMAKER N/A 11/21/2016   Procedure: Temporary Pacemaker;  Surgeon: Leonie Man, MD;  Location: [place] [facility];  Service: Cardiovascular;  Laterality: N/A;  . TONSILLECTOMY    . TOTAL KNEE ARTHROPLASTY Left 09/22/2012   Procedure: TOTAL KNEE ARTHROPLASTY;  Surgeon: Lorn Junes, MD;  Location: [place];  Service: Orthopedics;  Laterality: Left;    Current Medications: Current Meds  Medication Sig  . allopurinol (ZYLOPRIM) 300 MG tablet TAKE 1 TABLET DAILY  . atorvastatin (LIPITOR) 40 MG tablet TAKE 1 TABLET DAILY  . diphenhydramine-acetaminophen (TYLENOL PM) 25-500 MG TABS tablet Take 1 tablet by mouth at bedtime as needed (sleep).  [name] ELIQUIS 2.5 MG TABS tablet TAKE 1 TABLET TWICE A DAY  . metoprolol tartrate (LOPRESSOR) 25 MG tablet TAKE 1 TABLET TWICE A DAY  . nitroGLYCERIN (NITROSTAT) 0.4 MG SL tablet Place 1 tablet (0.4 mg total) under the tongue every 5 (five) minutes as needed for chest pain.  . ranolazine (RANEXA) 1000 MG SR tablet Take 1 tablet (1,000 mg total) by mouth 2 (two) times daily.  . Tamsulosin HCl (FLOMAX) 0.4 MG CAPS Take 0.4 mg by mouth at bedtime.      Allergies:   Epinephrine, Lisinopril, and Penicillins   Social History   Socioeconomic History  . Marital status: Married    Spouse name: Not on file  . Number of children: Not on file  . Years of education: Not on file  . Highest education level: Not on file  Occupational History  . Not on file  Social Needs  . Financial resource strain: Not on file  . Food insecurity    Worry: Not on file    Inability: Not on file  . Transportation needs    Medical: Not on file    Non-medical: Not on file  Tobacco Use  . Smoking status: Never Smoker  . Smokeless tobacco: Never Used  Substance and Sexual Activity  . Alcohol use: Yes    Comment: 11/21/2016 "nothing since 2015"  . Drug use: No  . Sexual activity: Yes    Partners: Female  Lifestyle  . Physical activity    Days per week: Not on file    Minutes per session: Not on file  . Stress: Not on file  Relationships  .  Social Herbalist on phone: Not on file    Gets together: Not on file    Attends religious service: Not on file    Active member of club or organization: Not on file    Attends meetings of clubs or organizations: Not on file    Relationship status: Not on file  Other Topics Concern  . Not on file  Social History Narrative   Married (Wife [name] patient of Dr. Yong Channel). 2 sons. 4 grandkids.    Originally from near [place], [place] at [employer] 6-7 months of the year      Retired from [employer] long time. 27 years of service.  Hobbies: golf   Used to [employer], run marathons     Family History: The patient's family history includes Breast cancer in his mother; Heart attack in his father.  ROS:   Please see the history of present illness.     All other systems reviewed and are negative.  EKGs/Labs/Other Studies Reviewed:    The following studies were reviewed today: Prior office notes, pacing reports, echocardiogram.    1. The left ventricle has normal systolic function with an ejection fraction of 60-65%. The cavity size was normal. There is moderately increased left ventricular wall thickness. Left ventricular diastolic Doppler parameters are consistent with impaired  relaxation. Indeterminate filling pressures The E/e' is 8-15. There is abnormal septal motion consistent with RV pacemaker.  2. The right ventricle has normal systolic function. The cavity was normal. There is no increase in right ventricular wall thickness.  3. Left atrial size was mildly dilated.  4. Right atrial size was mildly dilated.  5. The mitral valve is abnormal. Mild thickening of the mitral valve leaflet. There is mild mitral annular calcification present.  6. The tricuspid valve is abnormal. Tricuspid valve regurgitation is moderate.  7. The aortic valve is tricuspid. Mild calcification of the aortic valve. Aortic valve regurgitation is mild by color flow Doppler.  Moderate stenosis of the aortic valve.  8. When compared to the prior study: 11/05/17 EF 55-60%. Mild AS 23mmHg mean, 76mmHg peak. Mild-moderate AI.  EKG: None today  Recent Labs: 01/21/2019: ALT 20 05/18/2019: NT-Pro BNP 181; Platelets 168 05/22/2019: Hemoglobin 14.6 06/02/2019: BUN 36; Creatinine, Ser 1.88; Potassium 4.6; Sodium 139  Recent Lipid Panel    Component Value Date/Time   CHOL 123 01/21/2019 0956   TRIG 116.0 01/21/2019 0956   HDL 34.20 (L) 01/21/2019 0956   CHOLHDL 4 01/21/2019 0956   VLDL 23.2 01/21/2019 0956   LDLCALC 66 01/21/2019 0956   LDLDIRECT 79.0 12/08/2014 0905    Physical Exam:    VS:  BP 120/60   Pulse 91   Ht 6\' 3"  (1.905 m)   Wt 247 lb (112 kg)   SpO2 95%   BMI 30.87 kg/m     Wt Readings from Last 3 Encounters:  06/25/19 247 lb (112 kg)  06/02/19 252 lb 1.9 oz (114.4 kg)  05/22/19 250 lb (113.4 kg)     GEN: Well nourished, well developed, in no acute distress  HEENT: normal  Neck: no JVD, carotid bruits, or masses Cardiac: RRR; 2/6 SM, rubs, or gallops,no edema  Respiratory:  clear to auscultation bilaterally, normal work of breathing GI: soft, nontender, nondistended, + BS MS: no deformity or atrophy, left knee scar Skin: warm and dry, no rash Neuro:  Alert and Oriented x 3, Strength and sensation are intact Psych: euthymic mood, full affect   ASSESSMENT:    1. Coronary artery disease involving native coronary artery of native heart with angina pectoris ([place])   2. Dilated aortic root ([place])   3. Moderate aortic stenosis   4. History of DVT (deep vein thrombosis)    PLAN:    In order of problems listed above:  Coronary artery disease - Intervention to RCA and LAD with rotational atherectomy - Imdur caused headaches.now, Ranexa seems to be helping. - Creatinine 1.6-2.0 - Continue with current secondary prevention.  Since he is on prolonged Eliquis 2.5 twice a day given his prior thrombotic history, he is not on aspirin or Plavix  at this time.  He is doing well.  Prior  pulmonary embolism/DVT - This was found after he had continued shortness of breath post PCI. - Eliquis.,  Currently on low-dose 2.5 twice a day for prophylaxis.  No bleeding.  Dilated aortic root -4.0 cm.  Continuing to monitor.  Avoid Cipro.  Stable.  Continue with good blood pressure control.  Mild to moderate aortic stenosis -Murmur on exam, echo surveillance.  More moderate on the last echocardiogram.  We will continue to monitor at least on a yearly basis.  Chronic kidney disease stage IV -Creatinine has been ranging from 1.5-2.0. -Seeing Dr. Derterding, avoiding NSAIDs, we reiterated this again today.  Doing well.  Sick sinus syndrome -Pacemaker doing well, Dr. Curt [name], no changes     Medication Adjustments/Labs and Tests Ordered: Current medicines are reviewed at length with the patient today.  Concerns regarding medicines are outlined above.  No orders of the defined types were placed in this encounter.  No orders of the defined types were placed in this encounter.   Patient Instructions  Medication Instructions:  The current medical regimen is effective;  continue present plan and medications.  *If you need a refill on your cardiac medications before your next appointment, please call your pharmacy*  Follow-Up: At [facility], you and your health needs are our priority.  As part of our continuing mission to provide you with exceptional heart care, we have created designated Provider Care Teams.  These Care Teams include your primary Cardiologist (physician) and Advanced Practice Providers (APPs -  Physician Assistants and Nurse Practitioners) who all work together to provide you with the care you need, when you need it.  Your next appointment:   12 months  The format for your next appointment:   In Person  Provider:   [name], MD  Thank you for choosing [facility]!!         Signed, Richard Furbish,  MD  06/25/2019 12:01 PM    [place]

## 2019-07-04 ENCOUNTER — Emergency Department (HOSPITAL_COMMUNITY)
Admission: EM | Admit: 2019-07-04 | Discharge: 2019-07-04 | Disposition: A | Discharge status: Home / Self Care | Payer: Medicare Other | Attending: Emergency Medicine | Admitting: Emergency Medicine

## 2019-07-04 ENCOUNTER — Other Ambulatory Visit: Payer: Self-pay

## 2019-07-04 ENCOUNTER — Encounter (HOSPITAL_COMMUNITY): Payer: Self-pay | Admitting: Emergency Medicine

## 2019-07-04 DIAGNOSIS — Z880 Allergy status to penicillin: Secondary | ICD-10-CM | POA: Diagnosis not present

## 2019-07-04 DIAGNOSIS — E860 Dehydration: Secondary | ICD-10-CM | POA: Diagnosis not present

## 2019-07-04 DIAGNOSIS — Z888 Allergy status to other drugs, medicaments and biological substances status: Secondary | ICD-10-CM | POA: Insufficient documentation

## 2019-07-04 DIAGNOSIS — Z9861 Coronary angioplasty status: Secondary | ICD-10-CM | POA: Diagnosis not present

## 2019-07-04 DIAGNOSIS — I129 Hypertensive chronic kidney disease with stage 1 through stage 4 chronic kidney disease, or unspecified chronic kidney disease: Secondary | ICD-10-CM | POA: Insufficient documentation

## 2019-07-04 DIAGNOSIS — N184 Chronic kidney disease, stage 4 (severe): Secondary | ICD-10-CM | POA: Insufficient documentation

## 2019-07-04 DIAGNOSIS — N179 Acute kidney failure, unspecified: Secondary | ICD-10-CM

## 2019-07-04 DIAGNOSIS — I252 Old myocardial infarction: Secondary | ICD-10-CM | POA: Diagnosis not present

## 2019-07-04 DIAGNOSIS — E785 Hyperlipidemia, unspecified: Secondary | ICD-10-CM | POA: Insufficient documentation

## 2019-07-04 DIAGNOSIS — R42 Dizziness and giddiness: Secondary | ICD-10-CM | POA: Diagnosis not present

## 2019-07-04 DIAGNOSIS — Z884 Allergy status to anesthetic agent status: Secondary | ICD-10-CM | POA: Diagnosis not present

## 2019-07-04 DIAGNOSIS — Z86711 Personal history of pulmonary embolism: Secondary | ICD-10-CM | POA: Insufficient documentation

## 2019-07-04 DIAGNOSIS — R319 Hematuria, unspecified: Secondary | ICD-10-CM | POA: Diagnosis not present

## 2019-07-04 DIAGNOSIS — I2510 Atherosclerotic heart disease of native coronary artery without angina pectoris: Secondary | ICD-10-CM | POA: Insufficient documentation

## 2019-07-04 LAB — URINALYSIS, ROUTINE W REFLEX MICROSCOPIC
Bilirubin Urine: NEGATIVE
Glucose, UA: NEGATIVE mg/dL
Ketones, ur: NEGATIVE mg/dL
Leukocytes,Ua: NEGATIVE
Nitrite: NEGATIVE
Protein, ur: 30 mg/dL — AB
RBC / HPF: >50 RBC/hpf — ABNORMAL HIGH (ref 0–5)
Specific Gravity, Urine: 1.019 (ref 1.005–1.030)
pH: 5 (ref 5.0–8.0)

## 2019-07-04 LAB — BASIC METABOLIC PANEL
Anion gap: 10 (ref 5–15)
BUN: 40 mg/dL — ABNORMAL HIGH (ref 8–23)
CO2: 25 mmol/L (ref 22–32)
Calcium: 9.6 mg/dL (ref 8.9–10.3)
Chloride: 103 mmol/L (ref 98–111)
Creatinine, Ser: 2.54 mg/dL — ABNORMAL HIGH (ref 0.61–1.24)
GFR calc Af Amer: 26 mL/min — ABNORMAL LOW (ref >60)
GFR calc non Af Amer: 22 mL/min — ABNORMAL LOW (ref >60)
Glucose, Bld: 94 mg/dL (ref 70–99)
Potassium: 4 mmol/L (ref 3.5–5.1)
Sodium: 138 mmol/L (ref 135–145)

## 2019-07-04 LAB — CBG MONITORING, ED: Glucose-Capillary: 93 mg/dL (ref 70–99)

## 2019-07-04 LAB — CBC
HCT: 48.1 % (ref 39.0–52.0)
Hemoglobin: 15.7 g/dL (ref 13.0–17.0)
MCH: 30.9 pg (ref 26.0–34.0)
MCHC: 32.6 g/dL (ref 30.0–36.0)
MCV: 94.7 fL (ref 80.0–100.0)
Platelets: 174 10*3/uL (ref 150–400)
RBC: 5.08 MIL/uL (ref 4.22–5.81)
RDW: 13.5 % (ref 11.5–15.5)
WBC: 8.7 10*3/uL (ref 4.0–10.5)
nRBC: 0 % (ref 0.0–0.2)

## 2019-07-04 MED ORDER — LACTATED RINGERS IV BOLUS
1000.0000 mL | Freq: Once | INTRAVENOUS | Status: AC
  Administered 2019-07-04: 05:00:00 1000 mL via INTRAVENOUS

## 2019-07-04 MED ORDER — SODIUM CHLORIDE 0.9% FLUSH: 3.0000 mL | Freq: Once | INTRAVENOUS | Status: DC

## 2019-07-04 NOTE — ED Notes (Signed)
Discharge instructions discussed with pt. And wife at bedside. Pt. Verbalized understanding with no questions at this time. Pt to follow up with PCP

## 2019-07-04 NOTE — ED Triage Notes (Signed)
Pt. Complains of vertigo. Pt. Is fine sitting or standing but once pt lies down he states the room starts to spin. Denies any nausea, vomit or pain.

## 2019-07-04 NOTE — ED Provider Notes (Signed)
Emergency Department Provider Note   I have reviewed the triage vital signs and the nursing notes.   HISTORY  Chief Complaint Dizziness   HPI [name] is a 85 y.o. male who presents to the emergency department for self-described vertigo.  Patient states he had vertigo in the past and this feels exactly the same.  Before is when he turns his head to the left but now whenever he lays down flat he feels with her room spinning.  He has had much nausea with it but just the spinning sensation.  No recent illnesses.  No ear pain.  No infectious symptoms.  No numbness, tingling or weakness anywhere.  No history of stroke but does have a extensive cardiac history recently had a catheterization a few weeks ago.   No other associated or modifying symptoms.    Past Medical History:  Diagnosis Date   Arthritis    "back, knees" (11/21/2016)   Bladder stones    CAD (coronary artery disease)    11/20/16 PCI with DES--> LAD/RCA   Chronic lower back pain    CKD (chronic kidney disease), stage IV ([place])    [name] 11/08/2016   History of gout    History of kidney stones 12/02/2008   Hyperlipidemia    Hypertension    Myocardial infarction [facility])    "silent" (11/21/2016)   PSA, INCREASED 12/05/2009   UTI (urinary tract infection)     Patient Active Problem List   Diagnosis Date Noted   Sick sinus syndrome ([place]) 10/05/2017   Pulmonary embolus ([place]) 12/17/2016   Status post coronary artery stent placement    Coronary artery disease involving native coronary artery with angina pectoris ([place]) 11/21/2016   Dilated aortic root ([place]) 11/08/2016   Mild aortic stenosis 10/31/2016   Exertional shortness of breath: Concern for Anginal Equivalent (class II-III) 10/30/2016   Edema 12/08/2014   Healthcare maintenance 12/08/2014   Left knee DJD 09/22/2012   CKD (chronic kidney disease), stage III 09/22/2012   Gout 12/13/2010   BPH (benign prostatic hyperplasia) 12/05/2009     Hyperlipidemia 05/08/2007   Essential hypertension 05/08/2007    Past Surgical History:  Procedure Laterality Date   CARDIAC CATHETERIZATION  11/14/2016   CATARACT EXTRACTION W/ INTRAOCULAR LENS  IMPLANT, BILATERAL     CORONARY ANGIOPLASTY WITH STENT PLACEMENT  11/21/2016   "2 stents"    CORONARY ATHERECTOMY N/A 11/21/2016   Procedure: Coronary Atherectomy;  Surgeon: Leonie Man, MD;  Location: [place] [facility];  Service: Cardiovascular;  Laterality: N/A;  RCA and LAD   CORONARY STENT INTERVENTION N/A 11/21/2016   Procedure: Coronary Stent Intervention;  Surgeon: Leonie Man, MD;  Location: [place] [facility];  Service: Cardiovascular;  Laterality: N/A;  RCA and LAD   CYSTOSCOPY W/ STONE MANIPULATION     INTRAVASCULAR PRESSURE WIRE/FFR STUDY N/A 05/22/2019   Procedure: INTRAVASCULAR PRESSURE WIRE/FFR STUDY;  Surgeon: Nelva Bush, MD;  Location: [place] [facility];  Service: Cardiovascular;  Laterality: N/A;   JOINT REPLACEMENT     KNEE ARTHROSCOPY Left    LAPAROSCOPIC CHOLECYSTECTOMY     LEFT HEART CATH N/A 11/21/2016   Procedure: Left Heart Cath;  Surgeon: Leonie Man, MD;  Location: [place] [facility];  Service: Cardiovascular;  Laterality: N/A;   LITHOTRIPSY     PILONIDAL CYST EXCISION  56   RIGHT/LEFT HEART CATH AND CORONARY ANGIOGRAPHY N/A 11/14/2016   Procedure: Right/Left Heart Cath and Coronary Angiography;  Surgeon: Leonie Man,  MD;  Location: [place] [facility];  Service: Cardiovascular;  Laterality: N/A;   RIGHT/LEFT HEART CATH AND CORONARY ANGIOGRAPHY N/A 05/22/2019   Procedure: RIGHT/LEFT HEART CATH AND CORONARY ANGIOGRAPHY;  Surgeon: Nelva Bush, MD;  Location: [place] [facility];  Service: Cardiovascular;  Laterality: N/A;   TEMPORARY PACEMAKER N/A 11/21/2016   Procedure: Temporary Pacemaker;  Surgeon: Leonie Man, MD;  Location: [place] [facility];  Service: Cardiovascular;  Laterality: N/A;   TONSILLECTOMY     TOTAL KNEE  ARTHROPLASTY Left 09/22/2012   Procedure: TOTAL KNEE ARTHROPLASTY;  Surgeon: Lorn Junes, MD;  Location: [place];  Service: Orthopedics;  Laterality: Left;    Current Outpatient Rx   Order #: [id number] Class: Normal   Order #: [id number] Class: Normal   Order #: [id number] Class: Historical Med   Order #: [id number] Class: Normal   Order #: [id number] Class: Normal   Order #: [id number] Class: Normal   Order #: [id number] Class: Normal   Order #: [id number] Class: Historical Med    Allergies Epinephrine, Lisinopril, Penicillins, and Novocain [procaine]  Family History  Problem Relation Age of Onset   Breast cancer Mother    Heart attack Father        28, smoker    Social History Social History   Tobacco Use   Smoking status: Never Smoker   Smokeless tobacco: Never Used  Substance Use Topics   Alcohol use: Not Currently    Comment: 11/21/2016 "nothing since 2015"   Drug use: No    Review of Systems  All other systems negative except as documented in the HPI. All pertinent positives and negatives as reviewed in the HPI. ____________________________________________   PHYSICAL EXAM:  VITAL SIGNS: ED Triage Vitals  Enc Vitals Group     BP 07/04/19 0213 133/73     Pulse Rate 07/04/19 0213 63     Resp 07/04/19 0400 (!) 22     Temp 07/04/19 0213 97.8 F (36.6 C)     Temp Source 07/04/19 0213 Oral     SpO2 07/04/19 0213 98 %     Weight 07/04/19 0214 240 lb (108.9 kg)     Height 07/04/19 0214 6\' 3"  (1.905 m)    Constitutional: Alert and oriented. Well appearing and in no acute distress. Eyes: Conjunctivae are normal. PERRL. EOMI. Head: Atraumatic. Nose: No congestion/rhinnorhea. Mouth/Throat: Mucous membranes are moist.  Oropharynx non-erythematous. Neck: No stridor.  No meningeal signs.   Cardiovascular: Normal rate, regular rhythm. Good peripheral circulation. Grossly normal heart sounds.   Respiratory: Normal respiratory effort.  No retractions. Lungs  CTAB. Gastrointestinal: Soft and nontender. No distention.  Musculoskeletal: No lower extremity tenderness nor edema. No gross deformities of extremities. Neurologic:  No altered mental status, able to give full seemingly accurate history.  Face is symmetric, EOM's intact, pupils equal and reactive, vision intact, tongue and uvula midline without deviation. Upper and Lower extremity motor 5/5, intact pain perception in distal extremities, 2+ reflexes in biceps, patella and achilles tendons. Able to perform finger to nose normal with both hands. Walks without assistance or evident ataxia.  Skin:  Skin is warm, dry and intact. No rash noted.   ____________________________________________   LABS (all labs ordered are listed, but only abnormal results are displayed)  Labs Reviewed  BASIC METABOLIC PANEL - Abnormal; Notable for the following components:      Result Value   BUN 40 (*)    Creatinine, Ser 2.54 (*)    GFR calc non Af Amer 22 (*)  GFR calc Af Amer 26 (*)    All other components within normal limits  URINALYSIS, ROUTINE W REFLEX MICROSCOPIC - Abnormal; Notable for the following components:   Color, Urine AMBER (*)    APPearance CLOUDY (*)    Hgb urine dipstick LARGE (*)    Protein, ur 30 (*)    RBC / HPF >50 (*)    Bacteria, UA RARE (*)    All other components within normal limits  URINE CULTURE  CBC  CBG MONITORING, ED   ____________________________________________  EKG   EKG Interpretation  Date/Time:  Saturday July 04 2019 02:19:32 EST Ventricular Rate:  63 PR Interval:  272 QRS Duration: 120 QT Interval:  414 QTC Calculation: 423 R Axis:   -40 Text Interpretation: Atrial-paced rhythm with prolonged AV conduction with Premature supraventricular complexes Left axis deviation Non-specific intra-ventricular conduction delay Abnormal ECG similar paced rhythm to previously Confirmed by Merrily Pew [phone]) on 07/04/2019 4:18:39 AM        ____________________________________________   INITIAL IMPRESSION / ASSESSMENT AND PLAN / ED COURSE  Here with what seems like peripheral vertigo.  Low suspicion for central cause.  Also found to have mild dehydration, acute kidney injury.  His urine looks it may be infected based not really having urinary symptoms so we will just send a culture for now.  The extra blood and protein may be from the kidney injury?  Fluids given, symptoms significantly improved. Ambulates without difficulty. Will follow up with PCP to recheck kidney function.   Pertinent labs & imaging results that were available during my care of the patient were reviewed by me and considered in my medical decision making (see chart for details).  A medical screening exam was performed and I feel the patient has had an appropriate workup for their chief complaint at this time and likelihood of emergent condition existing is low. They have been counseled on decision, discharge, follow up and which symptoms necessitate immediate return to the emergency department. They or their family verbally stated understanding and agreement with plan and discharged in stable condition.   ____________________________________________  FINAL CLINICAL IMPRESSION(S) / ED DIAGNOSES  Final diagnoses:  Vertigo  AKI (acute kidney injury) ([place])     MEDICATIONS GIVEN DURING THIS VISIT:  Medications  sodium chloride flush (NS) 0.9 % injection 3 mL (3 mLs Intravenous Not Given 07/04/19 0352)  lactated ringers bolus 1,000 mL (0 mLs Intravenous Stopped 07/04/19 0547)     NEW OUTPATIENT MEDICATIONS STARTED DURING THIS VISIT:  Discharge Medication List as of 07/04/2019  6:08 AM      Note:  This note was prepared with assistance of Dragon voice recognition software. Occasional wrong-word or sound-a-like substitutions may have occurred due to the inherent limitations of voice recognition software.   Ivorie Uplinger, Corene Cornea, MD 07/04/19 [phone]

## 2019-07-04 NOTE — ED Notes (Signed)
Pt able to ambulate in hall independtly. Denies dizziness when ambulating. Pt instructed to change positions slowly

## 2019-07-05 LAB — CUP PACEART REMOTE DEVICE CHECK
Battery Remaining Longevity: 132 mo
Battery Voltage: 2.98 V
Brady Statistic AP VP Percent: 5.39 %
Brady Statistic AP VS Percent: 90.15 %
Brady Statistic AS VP Percent: 0.14 %
Brady Statistic AS VS Percent: 4.32 %
Brady Statistic RA Percent Paced: 95.2 %
Brady Statistic RV Percent Paced: 5.53 %
Date Time Interrogation Session: 20201121011140
Implantable Lead Implant Date: 20190116
Implantable Lead Implant Date: 20190116
Implantable Lead Location: 753859
Implantable Lead Location: 753860
Implantable Lead Model: 5076
Implantable Lead Model: 5076
Implantable Pulse Generator Implant Date: 20190116
Lead Channel Impedance Value: 304 Ohm
Lead Channel Impedance Value: 323 Ohm
Lead Channel Impedance Value: 361 Ohm
Lead Channel Impedance Value: 437 Ohm
Lead Channel Pacing Threshold Amplitude: 0.875 V
Lead Channel Pacing Threshold Amplitude: 0.875 V
Lead Channel Pacing Threshold Pulse Width: 0.4 ms
Lead Channel Pacing Threshold Pulse Width: 0.4 ms
Lead Channel Sensing Intrinsic Amplitude: 0.375 mV
Lead Channel Sensing Intrinsic Amplitude: 0.375 mV
Lead Channel Sensing Intrinsic Amplitude: 7.625 mV
Lead Channel Sensing Intrinsic Amplitude: 7.625 mV
Lead Channel Setting Pacing Amplitude: 2 V
Lead Channel Setting Pacing Amplitude: 2 V
Lead Channel Setting Pacing Pulse Width: 0.4 ms
Lead Channel Setting Sensing Sensitivity: 1.2 mV

## 2019-07-06 ENCOUNTER — Other Ambulatory Visit: Payer: Self-pay

## 2019-07-06 ENCOUNTER — Encounter: Payer: Self-pay | Admitting: Family Medicine

## 2019-07-06 ENCOUNTER — Ambulatory Visit (INDEPENDENT_AMBULATORY_CARE_PROVIDER_SITE_OTHER): Payer: Medicare Other | Admitting: Family Medicine

## 2019-07-06 VITALS — BP 132/84 | HR 69 | Temp 97.9°F | Ht 75.0 in | Wt 254.0 lb

## 2019-07-06 DIAGNOSIS — I25119 Atherosclerotic heart disease of native coronary artery with unspecified angina pectoris: Secondary | ICD-10-CM | POA: Diagnosis not present

## 2019-07-06 DIAGNOSIS — N3001 Acute cystitis with hematuria: Secondary | ICD-10-CM | POA: Diagnosis not present

## 2019-07-06 DIAGNOSIS — N179 Acute kidney failure, unspecified: Secondary | ICD-10-CM | POA: Diagnosis not present

## 2019-07-06 DIAGNOSIS — N1832 Chronic kidney disease, stage 3b: Secondary | ICD-10-CM

## 2019-07-06 DIAGNOSIS — R42 Dizziness and giddiness: Secondary | ICD-10-CM

## 2019-07-06 LAB — URINE CULTURE: Culture: 20000 — AB

## 2019-07-06 MED ORDER — CEPHALEXIN 250 MG PO CAPS: 250.0000 mg | ORAL_CAPSULE | Freq: Three times a day (TID) | ORAL | 0 refills | Status: DC

## 2019-07-06 NOTE — Patient Instructions (Addendum)
Possible urinary tract infection worsening kidney function- start keflex 250mg  every 8 hours for 7 days. Stop if you get hives and let me know so we can change meds- prior penicillin allergy and keflex is a cousin of penicillin  In 10 days schedule lab visit - we will recheck urine and blood- need to look at kidney function but hoping for significant improvement after treating UTI and with better hydration  Recommended follow up: 10 day lab visit, 6 months in person with me unless you need [place] sooner

## 2019-07-06 NOTE — Progress Notes (Signed)
Phone [phone] In person visit   Subjective:   [name] is a 85 y.o. year old very pleasant male patient who presents for/with See problem oriented charting Chief Complaint  Patient presents with  . Follow-up    from ED on 07/04/2019    ROS- Review of Systems  Constitutional: Negative.   HENT: Negative.   Eyes: Negative.   Respiratory: Negative.   Cardiovascular: Positive for leg swelling.       History of injury left leg swelling   Gastrointestinal: Negative.   Genitourinary: Negative.   Musculoskeletal: Negative.   Skin: Negative.   Neurological: Negative.  Negative for dizziness.  Endo/Heme/Allergies: Negative.   Psychiatric/Behavioral: Negative.   stable shortness of breath   This visit occurred during the SARS-CoV-2 public health emergency.  Safety protocols were in place, including screening questions prior to the visit, additional usage of staff PPE, and extensive cleaning of exam room while observing appropriate contact time as indicated for disinfecting solutions.   Past Medical History-  Patient Active Problem List   Diagnosis Date Noted  . Sick sinus syndrome ([place]) 10/05/2017    Priority: High  . Pulmonary embolus ([place]) 12/17/2016    Priority: High  . Status post coronary artery stent placement     Priority: High  . Coronary artery disease involving native coronary artery with angina pectoris ([place]) 11/21/2016    Priority: High  . Mild aortic stenosis 10/31/2016    Priority: High  . Exertional shortness of breath: Concern for Anginal Equivalent (class II-III) 10/30/2016    Priority: High  . CKD (chronic kidney disease), stage III 09/22/2012    Priority: High  . Gout 12/13/2010    Priority: Medium  . BPH (benign prostatic hyperplasia) 12/05/2009    Priority: Medium  . Hyperlipidemia 05/08/2007    Priority: Medium  . Essential hypertension 05/08/2007    Priority: Medium  . Edema 12/08/2014    Priority: Low  . Healthcare maintenance  12/08/2014    Priority: Low  . Left knee DJD 09/22/2012    Priority: Low  . Dilated aortic root ([place]) 11/08/2016    Medications- reviewed and updated Current Outpatient Medications  Medication Sig Dispense Refill  . allopurinol (ZYLOPRIM) 300 MG tablet TAKE 1 TABLET DAILY (Patient taking differently: Take 300 mg by mouth daily. ) 90 tablet 3  . atorvastatin (LIPITOR) 40 MG tablet TAKE 1 TABLET DAILY (Patient taking differently: Take 40 mg by mouth every evening. ) 90 tablet 3  . diphenhydramine-acetaminophen (TYLENOL PM) 25-500 MG TABS tablet Take 1 tablet by mouth at bedtime as needed (sleep).    [name] ELIQUIS 2.5 MG TABS tablet TAKE 1 TABLET TWICE A DAY (Patient taking differently: Take 2.5 mg by mouth 2 (two) times daily. ) 180 tablet 3  . metoprolol tartrate (LOPRESSOR) 25 MG tablet TAKE 1 TABLET TWICE A DAY (Patient taking differently: Take 25 mg by mouth 2 (two) times daily. ) 180 tablet 3  . nitroGLYCERIN (NITROSTAT) 0.4 MG SL tablet Place 1 tablet (0.4 mg total) under the tongue every 5 (five) minutes as needed for chest pain. 25 tablet 11  . ranolazine (RANEXA) 1000 MG SR tablet Take 1 tablet (1,000 mg total) by mouth 2 (two) times daily. 180 tablet 3  . Tamsulosin HCl (FLOMAX) 0.4 MG CAPS Take 0.4 mg by mouth at bedtime.      No current facility-administered medications for this visit.      Objective:  BP 132/84   Pulse 69   Temp  97.9 F (36.6 C) (Temporal)   Ht 6\' 3"  (1.905 m)   Wt 254 lb (115.2 kg)   SpO2 96%   BMI 31.75 kg/m  Gen: NAD, resting comfortably CV: RRR no murmurs rubs or gallops Lungs: CTAB no crackles, wheeze, rhonchi Abdomen: soft/nontender/nondistended/normal bowel sounds.  Ext: trace edema on right, left with 1+ Skin: warm, dry    Assessment and Plan   #ED follow up   # Vertigo S: Patient's symptoms resolved after getting fluids in the ED. denies palpitations or chest pain with this or shortness of breath.   A/P: Suspect vertigo was related to  dehydration-continue to monitor  # Acute Kidney Injury/potentially related to dehydration and/or UTI S:Patient was told in ED that he had acute kidney injury. Has history of reduced kidney function in the past-CKD stage III with GFR typically in the 30s or 40s. Has been seen by Nephrology in the past. Has not been seen by them in about two years.  In the emergency room GFR reduced to 22 with creatinine up to 2.54 from typical range of 1.6-1.8.  Urine culture grew 20,000 colonies of E. coli and there were red blood cells noted.  He is doing a good job of drinking 6 glasses of water a day- with 1 diet coke and 1 snapple ice tea per day.   A/P: Patient has been asymptomatic from UTI perspective but with worsening renal function we opted to go ahead and treat him today with Keflex 250 mg every 8 hours (renally dosed for GFR under 30).  We will recheck kidney function and urine in approximately 10 days- consider getting him back into nephrology if #s do not improve or worsen.  - pcn allergy- he will let me know if has issues with keflex  #CAD S: Patient with updated cardiac catheterization May 22, 2019-60% in-stent restenosis of proximal RCA.  Mild to moderate nonobstructive coronary artery disease in other areas including LAD and left circumflex.  LAD stent was patent.  Mild to moderate aortic valve stenosis noted.  There was consideration of PCI to proximal RCA in-stent restenosis if he fails to have improvement in symptoms with addition of Imdur.  He got a bad headache on imdur and had to change to ranexa A/P: no chest pain but stable SOB- also some oncern this could be from aortic stenosis with planned recheck 1 year   Recommended follow up: 10 day lab visit, 6 months in person with me unless you need [place] sooner Future Appointments  Date Time Provider [place]  07/08/2019  7:30 AM [facility] DEVICE REMOTES [facility]  10/07/2019  7:30 AM [facility] DEVICE REMOTES  [facility]  01/06/2020  7:30 AM [facility] DEVICE REMOTES [facility]  04/06/2020  7:30 AM [facility] DEVICE REMOTES [facility]  07/06/2020  7:30 AM [facility] DEVICE REMOTES [facility]    Lab/Order associations:   ICD-10-CM   1. Acute cystitis with hematuria  N30.01 Urine Microscopic    Urine culture  2. Acute renal failure superimposed on stage 3b chronic kidney disease, unspecified acute renal failure type ([place])  N17.9 Urine Microscopic   N18.32 BMET future  3. Coronary artery disease involving native coronary artery of native heart with angina pectoris ([place])  I25.119   4. Vertigo  R42     Meds ordered this encounter  Medications  . cephALEXin (KEFLEX) 250 MG capsule    Sig: Take 1 capsule (250 mg total) by mouth every 8 (eight) hours.    Dispense:  21 capsule    Refill:  0    Return precautions advised.  Garret Reddish, MD

## 2019-07-07 ENCOUNTER — Telehealth: Payer: Self-pay

## 2019-07-07 NOTE — Telephone Encounter (Signed)
Post ED Visit - Positive Culture Follow-up  Culture report reviewed by antimicrobial stewardship pharmacist: [place] Team []  Elenor Quinones, Pharm.D. []  Heide Guile, Pharm.D., BCPS AQ-ID []  Parks Neptune, Pharm.D., BCPS []  Alycia Rossetti, Pharm.D., BCPS []  [place], [place].D., BCPS, AAHIVP [x]  Legrand Como, Pharm.D., BCPS, AAHIVP []  Salome Arnt, PharmD, BCPS []  Johnnette Gourd, PharmD, BCPS []  Hughes Better, PharmD, BCPS []  Leeroy Cha, PharmD []  Laqueta Linden, PharmD, BCPS []  Albertina Parr, PharmD  [place] Team []  Leodis Sias, PharmD []  Lindell Spar, PharmD []  Royetta Asal, PharmD []  Graylin Shiver, Rph []  Rema Fendt) Glennon Mac, PharmD []  Arlyn Dunning, PharmD []  Netta Cedars, PharmD []  Dia Sitter, PharmD []  Leone Haven, PharmD []  Gretta Arab, PharmD []  Theodis Shove, PharmD []  Peggyann Juba, PharmD []  Reuel Boom, PharmD   Positive Devota Pace culture Treated with Cephalexin, organism sensitive to the same and no further patient follow-up is required at this time.  Genia Del 07/07/2019, 10:08 AM

## 2019-07-08 ENCOUNTER — Ambulatory Visit (INDEPENDENT_AMBULATORY_CARE_PROVIDER_SITE_OTHER): Payer: Medicare Other | Admitting: *Deleted

## 2019-07-08 DIAGNOSIS — I495 Sick sinus syndrome: Secondary | ICD-10-CM

## 2019-07-17 ENCOUNTER — Other Ambulatory Visit: Payer: Self-pay

## 2019-07-17 ENCOUNTER — Ambulatory Visit: Payer: Medicare Other | Admitting: Family Medicine

## 2019-07-20 ENCOUNTER — Other Ambulatory Visit (INDEPENDENT_AMBULATORY_CARE_PROVIDER_SITE_OTHER): Payer: Medicare Other

## 2019-07-20 DIAGNOSIS — N3001 Acute cystitis with hematuria: Secondary | ICD-10-CM | POA: Diagnosis not present

## 2019-07-20 DIAGNOSIS — N1832 Chronic kidney disease, stage 3b: Secondary | ICD-10-CM

## 2019-07-20 DIAGNOSIS — N179 Acute kidney failure, unspecified: Secondary | ICD-10-CM

## 2019-07-20 LAB — URINALYSIS, MICROSCOPIC ONLY

## 2019-07-20 LAB — BASIC METABOLIC PANEL
BUN: 41 mg/dL — ABNORMAL HIGH (ref 6–23)
CO2: 27 mEq/L (ref 19–32)
Calcium: 9.4 mg/dL (ref 8.4–10.5)
Chloride: 102 mEq/L (ref 96–112)
Creatinine, Ser: 1.92 mg/dL — ABNORMAL HIGH (ref 0.40–1.50)
GFR: 33.56 mL/min — ABNORMAL LOW (ref >60.00)
Glucose, Bld: 180 mg/dL — ABNORMAL HIGH (ref 70–99)
Potassium: 4.6 mEq/L (ref 3.5–5.1)
Sodium: 136 mEq/L (ref 135–145)

## 2019-07-21 LAB — URINE CULTURE
MICRO NUMBER:: 1170795
SPECIMEN QUALITY:: ADEQUATE

## 2019-07-26 ENCOUNTER — Encounter: Payer: Self-pay | Admitting: Family Medicine

## 2019-08-04 NOTE — Progress Notes (Signed)
PPM remote 

## 2019-09-14 ENCOUNTER — Ambulatory Visit (INDEPENDENT_AMBULATORY_CARE_PROVIDER_SITE_OTHER): Payer: Medicare Other | Admitting: Family Medicine

## 2019-09-14 ENCOUNTER — Other Ambulatory Visit: Payer: Self-pay

## 2019-09-14 ENCOUNTER — Ambulatory Visit: Payer: Medicare Other | Attending: Internal Medicine

## 2019-09-14 ENCOUNTER — Encounter: Payer: Self-pay | Admitting: Family Medicine

## 2019-09-14 VITALS — BP 148/80 | HR 92 | Temp 96.3°F | Ht 75.0 in | Wt 242.0 lb

## 2019-09-14 DIAGNOSIS — Z20822 Contact with and (suspected) exposure to covid-19: Secondary | ICD-10-CM | POA: Diagnosis not present

## 2019-09-14 DIAGNOSIS — J329 Chronic sinusitis, unspecified: Secondary | ICD-10-CM | POA: Diagnosis not present

## 2019-09-14 DIAGNOSIS — B9689 Other specified bacterial agents as the cause of diseases classified elsewhere: Secondary | ICD-10-CM | POA: Diagnosis not present

## 2019-09-14 MED ORDER — GUAIFENESIN-CODEINE 100-10 MG/5ML PO SOLN: 5.0000 mL | Freq: Four times a day (QID) | ORAL | 0 refills | Status: DC | PRN

## 2019-09-14 MED ORDER — DOXYCYCLINE HYCLATE 100 MG PO TABS: 100.0000 mg | ORAL_TABLET | Freq: Two times a day (BID) | ORAL | 0 refills | Status: AC

## 2019-09-14 NOTE — Progress Notes (Signed)
Phone [phone] Virtual visit via Video note   Subjective:  Chief complaint: Chief Complaint  Patient presents with  . virtual  . Cough   This visit type was conducted due to national recommendations for restrictions regarding the COVID-19 Pandemic (e.g. social distancing).  This format is felt to be most appropriate for this patient at this time balancing risks to patient and risks to population by having him in for in person visit.  No physical exam was performed (except for noted visual exam or audio findings with Telehealth visits).    Our team/I connected with [name] at 11:20 AM EST by a video enabled telemedicine application (doxy.me or caregility through epic) and verified that I am speaking with the correct person using two identifiers.  Location patient: [place] Location provider: [facility], office Persons participating in the virtual visit:  patient  Our team/I discussed the limitations of evaluation and management by telemedicine and the availability of in person appointments. In light of current covid-19 pandemic, patient also understands that we are trying to protect them by minimizing in office contact if at all possible.  The patient expressed consent for telemedicine visit and agreed to proceed. Patient understands insurance will be billed.   Past Medical History-  Patient Active Problem List   Diagnosis Date Noted  . Sick sinus syndrome ([place]) 10/05/2017    Priority: High  . Pulmonary embolus ([name]) 12/17/2016    Priority: High  . Status post coronary artery stent placement     Priority: High  . Coronary artery disease involving native coronary artery with angina pectoris ([name]) 11/21/2016    Priority: High  . Mild aortic stenosis 10/31/2016    Priority: High  . Exertional shortness of breath: Concern for Anginal Equivalent (class II-III) 10/30/2016    Priority: High  . CKD (chronic kidney disease), stage III 09/22/2012    Priority: High  . Gout  12/13/2010    Priority: Medium  . BPH (benign prostatic hyperplasia) 12/05/2009    Priority: Medium  . Hyperlipidemia 05/08/2007    Priority: Medium  . Essential hypertension 05/08/2007    Priority: Medium  . Edema 12/08/2014    Priority: Low  . Healthcare maintenance 12/08/2014    Priority: Low  . Left knee DJD 09/22/2012    Priority: Low  . Dilated aortic root ([name]) 11/08/2016    Medications- reviewed and updated Current Outpatient Medications  Medication Sig Dispense Refill  . allopurinol (ZYLOPRIM) 300 MG tablet TAKE 1 TABLET DAILY (Patient taking differently: Take 300 mg by mouth daily. ) 90 tablet 3  . atorvastatin (LIPITOR) 40 MG tablet TAKE 1 TABLET DAILY (Patient taking differently: Take 40 mg by mouth every evening. ) 90 tablet 3  . diphenhydramine-acetaminophen (TYLENOL PM) 25-500 MG TABS tablet Take 1 tablet by mouth at bedtime as needed (sleep).    [name] ELIQUIS 2.5 MG TABS tablet TAKE 1 TABLET TWICE A DAY (Patient taking differently: Take 2.5 mg by mouth 2 (two) times daily. ) 180 tablet 3  . metoprolol tartrate (LOPRESSOR) 25 MG tablet TAKE 1 TABLET TWICE A DAY (Patient taking differently: Take 25 mg by mouth 2 (two) times daily. ) 180 tablet 3  . nitroGLYCERIN (NITROSTAT) 0.4 MG SL tablet Place 1 tablet (0.4 mg total) under the tongue every 5 (five) minutes as needed for chest pain. 25 tablet 11  . ranolazine (RANEXA) 1000 MG SR tablet Take 1 tablet (1,000 mg total) by mouth 2 (two) times daily. 180 tablet 3  .  Tamsulosin HCl (FLOMAX) 0.4 MG CAPS Take 0.4 mg by mouth at bedtime.     [name] doxycycline (VIBRA-TABS) 100 MG tablet Take 1 tablet (100 mg total) by mouth 2 (two) times daily for 7 days. 14 tablet 0  . guaiFENesin-codeine 100-10 MG/5ML syrup Take 5 mLs by mouth every 6 (six) hours as needed for cough. 120 mL 0   No current facility-administered medications for this visit.     Objective:  BP (!) 148/80   Pulse 92   Temp (!) 96.3 F (35.7 C)   Ht 6\' 3"  (1.905 m)    Wt 242 lb (109.8 kg)   BMI 30.25 kg/m  self reported vitals Gen: NAD, resting comfortably Lungs: nonlabored, normal respiratory rate  Skin: appears dry, no obvious rash     Assessment and Plan   Dry Cough/ nasal discharge S:pt c/o dry cough x2 weeks. Pt denies fever, nasal and chest congestion. Pt denies lost of taste and smell. Pt states he has not take anything for the cough and he has not taken anything for it. He got his first covid shot on 08/19/19- scheduled day after tomorrow for 2nd shot with [place]. Gets dizzy if has a lot of coughing. Stable baseline SOB.   Mostly comes on after 5 pm and into evening but is able to s come in leep at night. Wakes up in AM with running nose (he wonders if related to one of his meds) a lot of nasal.  Discharge. A lot of nasal discharge in the morning- mainly clear. Not much sinus pressure.   Has tried some old codeine cough medicine and that really helped him with coughing fits- requests refill A/P: 86 year old male with dry cough worsening in the evenings with good control with codeine cough syrup as well as nasal congestion-rather significant in the morning concerning for potential bacterial sinusitis with postnasal drip.  Will cover with doxycycline for 7 days (penicillin allergy).  I did refill his codeine cough syrup-should avoid driving for 6 hours after this. -Blood pressure was slightly high today but patient is not feeling well-we will recheck at follow-up -Does have some shortness of breath but this is stable at his baseline with known CAD-if worsens he will let us know  Patient with symptoms concerning for potential covid 19 Therefore: -information provided on testing scheduling "please text "COVID" to [phone], OR you can log on to [email or web address] to easily make an on-line appointment. "  - recommended patient watch closely for shortness of breath  Worsening or confusion or worsening symptoms and if those occur he should  contact us immediately  -recommended patient consider purchasing pulse oximeter and if levels 94% or below persistently- seek care at the hospital  -recommended self isolation until negative test  at minimum. Also discussed potential for false negatives and to still be cautious even with negative test  - for quarantine if covid 19 test positive would need to be at least 10 days since first symptom AND at least 24 hours fever free without fever reducing medications AND improvement in respiratory symptoms  - we also discussed close contacts would need 14 day quarantine after last close contact with patient IF patient is positive   Recommended follow up: As needed for acute concern-otherwise would make sure to see [place] at least every 6 months Future Appointments  Date Time Provider [place]  09/14/2019 11:20 AM Marin Olp, MD [facility]  10/07/2019  7:30 AM [facility] DEVICE REMOTES [facility]  01/06/2020  7:30 AM [facility] DEVICE REMOTES [facility]  04/06/2020  7:30 AM [facility] DEVICE REMOTES [facility]  07/06/2020  7:30 AM [facility] DEVICE REMOTES [facility]    Lab/Order associations:   ICD-10-CM   1. Bacterial sinusitis  J32.9    B96.89     Meds ordered this encounter  Medications  . doxycycline (VIBRA-TABS) 100 MG tablet    Sig: Take 1 tablet (100 mg total) by mouth 2 (two) times daily for 7 days.    Dispense:  14 tablet    Refill:  0  . guaiFENesin-codeine 100-10 MG/5ML syrup    Sig: Take 5 mLs by mouth every 6 (six) hours as needed for cough.    Dispense:  120 mL    Refill:  0   Return precautions advised.  Garret Reddish, MD

## 2019-09-14 NOTE — Patient Instructions (Signed)
There are no preventive care reminders to display for this patient. Depression screen [facility] 2/9 11/18/2018 10/04/2017 01/19/2016  Decreased Interest 0 0 0  Down, Depressed, Hopeless 0 0 0  PHQ - 2 Score 0 0 0

## 2019-09-15 LAB — NOVEL CORONAVIRUS, NAA: SARS-CoV-2, NAA: NOT DETECTED

## 2019-09-24 ENCOUNTER — Encounter: Payer: Self-pay | Admitting: Family Medicine

## 2019-10-01 ENCOUNTER — Other Ambulatory Visit: Payer: Self-pay

## 2019-10-01 ENCOUNTER — Telehealth (INDEPENDENT_AMBULATORY_CARE_PROVIDER_SITE_OTHER): Payer: Medicare Other | Admitting: Cardiology

## 2019-10-01 VITALS — Ht 75.0 in | Wt 240.0 lb

## 2019-10-01 DIAGNOSIS — I10 Essential (primary) hypertension: Secondary | ICD-10-CM | POA: Diagnosis not present

## 2019-10-01 DIAGNOSIS — Z86711 Personal history of pulmonary embolism: Secondary | ICD-10-CM | POA: Diagnosis not present

## 2019-10-01 DIAGNOSIS — I77810 Thoracic aortic ectasia: Secondary | ICD-10-CM | POA: Diagnosis not present

## 2019-10-01 DIAGNOSIS — I25119 Atherosclerotic heart disease of native coronary artery with unspecified angina pectoris: Secondary | ICD-10-CM | POA: Diagnosis not present

## 2019-10-01 NOTE — Patient Instructions (Signed)
Medication Instructions:  The current medical regimen is effective;  continue present plan and medications.  You may also use over the counter Miralax, polyethylene glycol or colace with senna or Metamucil for symptoms of constipation.   *If you need a refill on your cardiac medications before your next appointment, please call your pharmacy*  Follow-Up: Follow up with Dr Marlou Porch in November as planned.  You will be contacted at a later date to be scheduled for this appointment.  Thank you for choosing [facility]!!      Constipation, Adult Constipation is when a person has fewer bowel movements in a week than normal, has difficulty having a bowel movement, or has stools that are dry, hard, or larger than normal. Constipation may be caused by an underlying condition. It may become worse with age if a person takes certain medicines and does not take in enough fluids. Follow these instructions at home: Eating and drinking   Eat foods that have a lot of fiber, such as fresh fruits and vegetables, whole grains, and beans.  Limit foods that are high in fat, low in fiber, or overly processed, such as french fries, hamburgers, cookies, candies, and soda.  Drink enough fluid to keep your urine clear or pale yellow. General instructions  Exercise regularly or as told by your health care provider.  Go to the restroom when you have the urge to go. Do not hold it in.  Take over-the-counter and prescription medicines only as told by your health care provider. These include any fiber supplements.  Practice pelvic floor retraining exercises, such as deep breathing while relaxing the lower abdomen and pelvic floor relaxation during bowel movements.  Watch your condition for any changes.  Keep all follow-up visits as told by your health care provider. This is important. Contact a health care provider if:  You have pain that gets worse.  You have a fever.  You do not have a bowel  movement after 4 days.  You vomit.  You are not hungry.  You lose weight.  You are bleeding from the anus.  You have thin, pencil-like stools. Get help right away if:  You have a fever and your symptoms suddenly get worse.  You leak stool or have blood in your stool.  Your abdomen is bloated.  You have severe pain in your abdomen.  You feel dizzy or you faint. This information is not intended to replace advice given to you by your health care provider. Make sure you discuss any questions you have with your health care provider. Document Revised: 07/12/2017 Document Reviewed: 01/18/2016 Elsevier Patient Education  2020 [employer].

## 2019-10-01 NOTE — Telephone Encounter (Signed)
Good questions.  Lets add him onto the clinic this afternoon virtual to discuss. [name], MD

## 2019-10-01 NOTE — Progress Notes (Signed)
Virtual Visit via Video Note   This visit type was conducted due to national recommendations for restrictions regarding the COVID-19 Pandemic (e.g. social distancing) in an effort to limit this patient's exposure and mitigate transmission in our community.  Due to his co-morbid illnesses, this patient is at least at moderate risk for complications without adequate follow up.  This format is felt to be most appropriate for this patient at this time.  All issues noted in this document were discussed and addressed.  A limited physical exam was performed with this format.  Please refer to the patient's chart for his consent to telehealth for [facility].   Date:  10/01/2019   ID:  [name], DOB 12/12/1933, MRN [id number]  Patient Location: Home Provider Location: Home  PCP:  Marin Olp, MD  Cardiologist:  [name], MD  Electrophysiologist:  Constance Haw, MD   Evaluation Performed:  Follow-Up Visit  Chief Complaint: Constipation  History of Present Illness:    [name] is a 86 y.o. male with coronary artery disease angina here to discuss possible side effects of Ranexa, constipation.  He was also concerned about his stents that he has given the new news on Medtronic vascular stents fracturing and causing a few deaths in their studies.  He also has a pacemaker for sick sinus syndrome.  Prior cardiac catheterization showed heavy disease in his RCA and LAD and he underwent atherectomy.  After the procedure he continued to have shortness of breath and a PE CT revealed subsegmental PE in his left lower lobe.  Small clot burden.  DVTs were noted with well bilateral cannulization of clot.  Eliquis was started and aspirin discontinued at that time.  He was continued on Plavix.  He goes to [employer] quite frequently where Dr. Josem Kaufmann, cardiologist at [facility] has seen him.  While he was in [place] his heart rate decreased to 24 bpm  and he had a pacemaker placed.  Dr. Curt Bears follows him here.  His wife's name is June, he is a retired Conservation officer, historic buildings for 30 years.  [place]  His Ranexa seems to be helping him with his angina.  He could not tolerate isosorbide.  He does however have fairly significant constipation.  He also has a mildly dilated aortic root.  He has tried senna.  Bike 73 min on stationary bike.  Doing well.  No chest pain.  The patient does not have symptoms concerning for COVID-19 infection (fever, chills, cough, or new shortness of breath).    Past Medical History:  Diagnosis Date  . Arthritis    "back, knees" (11/21/2016)  . Bladder stones   . CAD (coronary artery disease)    11/20/16 PCI with DES--> LAD/RCA  . Chronic lower back pain   . CKD (chronic kidney disease), stage IV ([place])    [name] 11/08/2016  . History of gout   . History of kidney stones 12/02/2008  . Hyperlipidemia   . Hypertension   . Myocardial infarction [facility])    "silent" (11/21/2016)  . PSA, INCREASED 12/05/2009  . UTI (urinary tract infection)    Past Surgical History:  Procedure Laterality Date  . CARDIAC CATHETERIZATION  11/14/2016  . CATARACT EXTRACTION W/ INTRAOCULAR LENS  IMPLANT, BILATERAL    . CORONARY ANGIOPLASTY WITH STENT PLACEMENT  11/21/2016   "2 stents"   . CORONARY ATHERECTOMY N/A 11/21/2016   Procedure: Coronary Atherectomy;  Surgeon: Leonie Man,  MD;  Location: [place] [facility];  Service: Cardiovascular;  Laterality: N/A;  RCA and LAD  . CORONARY STENT INTERVENTION N/A 11/21/2016   Procedure: Coronary Stent Intervention;  Surgeon: Leonie Man, MD;  Location: [place] [facility];  Service: Cardiovascular;  Laterality: N/A;  RCA and LAD  . CYSTOSCOPY W/ STONE MANIPULATION    . INTRAVASCULAR PRESSURE WIRE/FFR STUDY N/A 05/22/2019   Procedure: INTRAVASCULAR PRESSURE WIRE/FFR STUDY;  Surgeon: Nelva Bush, MD;  Location: [place] [facility];  Service: Cardiovascular;  Laterality:  N/A;  . JOINT REPLACEMENT    . KNEE ARTHROSCOPY Left   . LAPAROSCOPIC CHOLECYSTECTOMY    . LEFT HEART CATH N/A 11/21/2016   Procedure: Left Heart Cath;  Surgeon: Leonie Man, MD;  Location: [place] [facility];  Service: Cardiovascular;  Laterality: N/A;  . LITHOTRIPSY    . PILONIDAL CYST EXCISION  58  . RIGHT/LEFT HEART CATH AND CORONARY ANGIOGRAPHY N/A 11/14/2016   Procedure: Right/Left Heart Cath and Coronary Angiography;  Surgeon: Leonie Man, MD;  Location: [place] [facility];  Service: Cardiovascular;  Laterality: N/A;  . RIGHT/LEFT HEART CATH AND CORONARY ANGIOGRAPHY N/A 05/22/2019   Procedure: RIGHT/LEFT HEART CATH AND CORONARY ANGIOGRAPHY;  Surgeon: Nelva Bush, MD;  Location: [place] [facility];  Service: Cardiovascular;  Laterality: N/A;  . TEMPORARY PACEMAKER N/A 11/21/2016   Procedure: Temporary Pacemaker;  Surgeon: Leonie Man, MD;  Location: [place] [facility];  Service: Cardiovascular;  Laterality: N/A;  . TONSILLECTOMY    . TOTAL KNEE ARTHROPLASTY Left 09/22/2012   Procedure: TOTAL KNEE ARTHROPLASTY;  Surgeon: Lorn Junes, MD;  Location: [place];  Service: Orthopedics;  Laterality: Left;     Current Meds  Medication Sig  . allopurinol (ZYLOPRIM) 300 MG tablet TAKE 1 TABLET DAILY (Patient taking differently: Take 300 mg by mouth daily. )  . atorvastatin (LIPITOR) 40 MG tablet TAKE 1 TABLET DAILY (Patient taking differently: Take 40 mg by mouth every evening. )  . diphenhydramine-acetaminophen (TYLENOL PM) 25-500 MG TABS tablet Take 1 tablet by mouth at bedtime as needed (sleep).  [name] ELIQUIS 2.5 MG TABS tablet TAKE 1 TABLET TWICE A DAY (Patient taking differently: Take 2.5 mg by mouth 2 (two) times daily. )  . metoprolol tartrate (LOPRESSOR) 25 MG tablet TAKE 1 TABLET TWICE A DAY (Patient taking differently: Take 25 mg by mouth 2 (two) times daily. )  . nitroGLYCERIN (NITROSTAT) 0.4 MG SL tablet Place 1 tablet (0.4 mg total) under the tongue every 5 (five) minutes  as needed for chest pain.  . ranolazine (RANEXA) 1000 MG SR tablet Take 1 tablet (1,000 mg total) by mouth 2 (two) times daily.  . Tamsulosin HCl (FLOMAX) 0.4 MG CAPS Take 0.4 mg by mouth at bedtime.      Allergies:   Epinephrine, Lisinopril, Penicillins, and Novocain [procaine]   Social History   Tobacco Use  . Smoking status: Never Smoker  . Smokeless tobacco: Never Used  Substance Use Topics  . Alcohol use: Not Currently    Comment: 11/21/2016 "nothing since 2015"  . Drug use: No     Family Hx: The patient's family history includes Breast cancer in his mother; Heart attack in his father.  ROS:   Please see the history of present illness.    No fevers chills nausea vomiting syncope bleeding All other systems reviewed and are negative.   Prior CV studies:   The following studies were reviewed today:  Echocardiogram 02/03/2019: LVEF 60-65%,  moderate LVH, incoordinate paced septal motion, grade 1  DD, indeterminate LV filling pressure, RV pacer, mild biatrial  enlargement, moderate calcific aortic stenosis - mean gradient 17  mmHg (AVA 1.3 cm2), mild to moderate AI, MAC with trivial MR,  moderate TR, RVSP 43 mmHg, normal IVC   Labs/Other Tests and Data Reviewed:    EKG:  An ECG dated 07/04/2019 was personally reviewed today and demonstrated:  Atrial pacing, normal QRS  Recent Labs: 01/21/2019: ALT 20 05/18/2019: NT-Pro BNP 181 07/04/2019: Hemoglobin 15.7; Platelets 174 07/20/2019: BUN 41; Creatinine, Ser 1.92; Potassium 4.6; Sodium 136   Recent Lipid Panel Lab Results  Component Value Date/Time   CHOL 123 01/21/2019 09:56 AM   TRIG 116.0 01/21/2019 09:56 AM   HDL 34.20 (L) 01/21/2019 09:56 AM   CHOLHDL 4 01/21/2019 09:56 AM   LDLCALC 66 01/21/2019 09:56 AM   LDLDIRECT 79.0 12/08/2014 09:05 AM    Wt Readings from Last 3 Encounters:  10/01/19 240 lb (108.9 kg)  09/14/19 242 lb (109.8 kg)  07/06/19 254 lb (115.2 kg)     Objective:    Vital Signs:  Ht _0   (1.905 m)   Wt 240 lb (108.9 kg)   BMI 30.00 kg/m    VITAL SIGNS:  reviewed GEN:  no acute distress EYES:  sclerae anicteric, EOMI - Extraocular Movements Intact RESPIRATORY:  normal respiratory effort, symmetric expansion SKIN:  no rash, lesions or ulcers. MUSCULOSKELETAL:  no obvious deformities. NEURO:  alert and oriented x 3, no obvious focal deficit PSYCH:  normal affect  ASSESSMENT & PLAN:    Constipation -I recommended instead of senna to try MiraLAX, polyethylene glycol.  Other alternatives include Colace with senna, stool softener with stimulant, Metamucil.  Coronary artery disease -We did discuss his Medtronic resolute stents.  He was concerned over the recent trial data with Medtronic vascular stent fracturing.  I reassured him that there have not been any issues with the Medtronic coronary stents.  Essential hypertension -Very well controlled.  Exercising well.  Shortness of breath -Improved but still present.  Dilated aortic root -We will continue to monitor with echocardiogram  Moderate aortic stenosis -When compared to the prior study: 11/05/17 EF 55-60%. Mild AS 73mHg  mean, 283mg peak. Mild-moderate AI.   COVID-19 Education: The signs and symptoms of COVID-19 were discussed with the patient and how to seek care for testing (follow up with PCP or arrange E-visit).  The importance of social distancing was discussed today.  Time:   Today, I have spent 19 minutes with the patient with telehealth technology discussing the above problems.     Medication Adjustments/Labs and Tests Ordered: Current medicines are reviewed at length with the patient today.  Concerns regarding medicines are outlined above.   Tests Ordered: No orders of the defined types were placed in this encounter.   Medication Changes: No orders of the defined types were placed in this encounter.   Follow Up:  As previously scheduled   Signed, MaCandee FurbishMD  10/01/2019 3:16 PM     [place]

## 2019-10-07 ENCOUNTER — Ambulatory Visit (INDEPENDENT_AMBULATORY_CARE_PROVIDER_SITE_OTHER): Payer: Medicare Other | Admitting: *Deleted

## 2019-10-07 DIAGNOSIS — I495 Sick sinus syndrome: Secondary | ICD-10-CM

## 2019-10-08 LAB — CUP PACEART REMOTE DEVICE CHECK
Battery Remaining Longevity: 92 mo
Battery Voltage: 2.96 V
Brady Statistic AP VP Percent: 5.5 %
Brady Statistic AP VS Percent: 91.68 %
Brady Statistic AS VP Percent: 0.05 %
Brady Statistic AS VS Percent: 2.77 %
Brady Statistic RA Percent Paced: 97.16 %
Brady Statistic RV Percent Paced: 5.55 %
Date Time Interrogation Session: 20210225103525
Implantable Lead Implant Date: 20190116
Implantable Lead Implant Date: 20190116
Implantable Lead Location: 753859
Implantable Lead Location: 753860
Implantable Lead Model: 5076
Implantable Lead Model: 5076
Implantable Pulse Generator Implant Date: 20190116
Lead Channel Impedance Value: 304 Ohm
Lead Channel Impedance Value: 304 Ohm
Lead Channel Impedance Value: 361 Ohm
Lead Channel Impedance Value: 399 Ohm
Lead Channel Pacing Threshold Amplitude: 0.75 V
Lead Channel Pacing Threshold Amplitude: 1.625 V
Lead Channel Pacing Threshold Pulse Width: 0.4 ms
Lead Channel Pacing Threshold Pulse Width: 0.4 ms
Lead Channel Sensing Intrinsic Amplitude: 0.5 mV
Lead Channel Sensing Intrinsic Amplitude: 0.5 mV
Lead Channel Sensing Intrinsic Amplitude: 7.5 mV
Lead Channel Sensing Intrinsic Amplitude: 7.5 mV
Lead Channel Setting Pacing Amplitude: 2 V
Lead Channel Setting Pacing Amplitude: 3.25 V
Lead Channel Setting Pacing Pulse Width: 0.4 ms
Lead Channel Setting Sensing Sensitivity: 1.2 mV

## 2019-10-16 DIAGNOSIS — R05 Cough: Secondary | ICD-10-CM | POA: Diagnosis not present

## 2019-10-30 ENCOUNTER — Other Ambulatory Visit: Payer: Self-pay | Admitting: Family Medicine

## 2019-11-06 ENCOUNTER — Encounter: Payer: Self-pay | Admitting: Family Medicine

## 2019-11-10 ENCOUNTER — Other Ambulatory Visit: Payer: Self-pay

## 2019-11-10 MED ORDER — PANTOPRAZOLE SODIUM 40 MG PO TBEC: 40.0000 mg | DELAYED_RELEASE_TABLET | Freq: Every day | ORAL | 3 refills | Status: DC

## 2019-11-19 ENCOUNTER — Ambulatory Visit (INDEPENDENT_AMBULATORY_CARE_PROVIDER_SITE_OTHER): Payer: Medicare Other

## 2019-11-19 ENCOUNTER — Other Ambulatory Visit: Payer: Self-pay

## 2019-11-19 VITALS — BP 122/82 | HR 83 | Temp 96.2°F | Ht 75.0 in | Wt 241.0 lb

## 2019-11-19 DIAGNOSIS — Z0000 Encounter for general adult medical examination without abnormal findings: Secondary | ICD-10-CM

## 2019-11-19 NOTE — Patient Instructions (Signed)
[name] , Thank you for taking time to come for your Medicare Wellness Visit. I appreciate your ongoing commitment to your health goals. Please review the following plan we discussed and let me know if I can assist you in the future.   Screening recommendations/referrals: Colorectal Screening: No longer indicated   Vision and Dental Exams: Recommended annual ophthalmology exams for early detection of glaucoma and other disorders of the eye Recommended annual dental exams for proper oral hygiene  Vaccinations: Influenza vaccine: completed 05/01/19 Pneumococcal vaccine: up to date; last 12/28/13 Tdap vaccine: recommended every 10 years; Please call your insurance company to determine your out of pocket expense. You also receive this vaccine at your local pharmacy or Health Dept. (your last TDap 12/05/09) Shingles vaccine: You may receive this vaccine at your local pharmacy. (see handout)  Covid vaccine:  Completed   Advanced directives: Please bring a copy of your POA (Power of Attorney) and/or Living Will to your next appointment.  Goals: Recommend to drink at least 6-8 8oz glasses of water per day and consume a balanced diet rich in fresh fruits and vegetables.   Next appointment: Please schedule your Annual Wellness Visit with your Nurse Health Advisor in one year.  Preventive Care 60 Years and Older, Male Preventive care refers to lifestyle choices and visits with your health care provider that can promote health and wellness. What does preventive care include?  A yearly physical exam. This is also called an annual well check.  Dental exams once or twice a year.  Routine eye exams. Ask your health care provider how often you should have your eyes checked.  Personal lifestyle choices, including:  Daily care of your teeth and gums.  Regular physical activity.  Eating a healthy diet.  Avoiding tobacco and drug use.  Limiting alcohol use.  Practicing safe sex.  Taking low  doses of aspirin every day if recommended by your health care provider..  Taking vitamin and mineral supplements as recommended by your health care provider. What happens during an annual well check? The services and screenings done by your health care provider during your annual well check will depend on your age, overall health, lifestyle risk factors, and family history of disease. Counseling  Your health care provider may ask you questions about your:  Alcohol use.  Tobacco use.  Drug use.  Emotional well-being.  Home and relationship well-being.  Sexual activity.  Eating habits.  History of falls.  Memory and ability to understand (cognition).  Work and work Statistician. Screening  You may have the following tests or measurements:  Height, weight, and BMI.  Blood pressure.  Lipid and cholesterol levels. These may be checked every 5 years, or more frequently if you are over 35 years old.  Skin check.  Lung cancer screening. You may have this screening every year starting at age 13 if you have a 30-pack-year history of smoking and currently smoke or have quit within the past 15 years.  Fecal occult blood test (FOBT) of the stool. You may have this test every year starting at age 49.  Flexible sigmoidoscopy or colonoscopy. You may have a sigmoidoscopy every 5 years or a colonoscopy every 10 years starting at age 70.  Prostate cancer screening. Recommendations will vary depending on your family history and other risks.  Hepatitis C blood test.  Hepatitis B blood test.  Sexually transmitted disease (STD) testing.  Diabetes screening. This is done by checking your blood sugar (glucose) after you have not  eaten for a while (fasting). You may have this done every 1-3 years.  Abdominal aortic aneurysm (AAA) screening. You may need this if you are a current or former smoker.  Osteoporosis. You may be screened starting at age 27 if you are at high risk. Talk with  your health care provider about your test results, treatment options, and if necessary, the need for more tests. Vaccines  Your health care provider may recommend certain vaccines, such as:  Influenza vaccine. This is recommended every year.  Tetanus, diphtheria, and acellular pertussis (Tdap, Td) vaccine. You may need a Td booster every 10 years.  Zoster vaccine. You may need this after age 46.  Pneumococcal 13-valent conjugate (PCV13) vaccine. One dose is recommended after age 27.  Pneumococcal polysaccharide (PPSV23) vaccine. One dose is recommended after age 27. Talk to your health care provider about which screenings and vaccines you need and how often you need them. This information is not intended to replace advice given to you by your health care provider. Make sure you discuss any questions you have with your health care provider. Document Released: 08/26/2015 Document Revised: 04/18/2016 Document Reviewed: 05/31/2015 Elsevier Interactive Patient Education  [address] Prevention in the Home Falls can cause injuries. They can happen to people of all ages. There are many things you can do to make your home safe and to help prevent falls. What can I do on the outside of my home?  Regularly fix the edges of walkways and driveways and fix any cracks.  Remove anything that might make you trip as you walk through a door, such as a raised step or threshold.  Trim any bushes or trees on the path to your home.  Use bright outdoor lighting.  Clear any walking paths of anything that might make someone trip, such as rocks or tools.  Regularly check to see if handrails are loose or broken. Make sure that both sides of any steps have handrails.  Any raised decks and porches should have guardrails on the edges.  Have any leaves, snow, or ice cleared regularly.  Use sand or salt on walking paths during winter.  Clean up any spills in your garage right away. This  includes oil or grease spills. What can I do in the bathroom?  Use night lights.  Install grab bars by the toilet and in the tub and shower. Do not use towel bars as grab bars.  Use non-skid mats or decals in the tub or shower.  If you need to sit down in the shower, use a plastic, non-slip stool.  Keep the floor dry. Clean up any water that spills on the floor as soon as it happens.  Remove soap buildup in the tub or shower regularly.  Attach bath mats securely with double-sided non-slip rug tape.  Do not have throw rugs and other things on the floor that can make you trip. What can I do in the bedroom?  Use night lights.  Make sure that you have a light by your bed that is easy to reach.  Do not use any sheets or blankets that are too big for your bed. They should not hang down onto the floor.  Have a firm chair that has side arms. You can use this for support while you get dressed.  Do not have throw rugs and other things on the floor that can make you trip. What can I do in the kitchen?  Clean up any spills  right away.  Avoid walking on wet floors.  Keep items that you use a lot in easy-to-reach places.  If you need to reach something above you, use a strong step stool that has a grab bar.  Keep electrical cords out of the way.  Do not use floor polish or wax that makes floors slippery. If you must use wax, use non-skid floor wax.  Do not have throw rugs and other things on the floor that can make you trip. What can I do with my stairs?  Do not leave any items on the stairs.  Make sure that there are handrails on both sides of the stairs and use them. Fix handrails that are broken or loose. Make sure that handrails are as long as the stairways.  Check any carpeting to make sure that it is firmly attached to the stairs. Fix any carpet that is loose or worn.  Avoid having throw rugs at the top or bottom of the stairs. If you do have throw rugs, attach them to the  floor with carpet tape.  Make sure that you have a light switch at the top of the stairs and the bottom of the stairs. If you do not have them, ask someone to add them for you. What else can I do to help prevent falls?  Wear shoes that:  Do not have high heels.  Have rubber bottoms.  Are comfortable and fit you well.  Are closed at the toe. Do not wear sandals.  If you use a stepladder:  Make sure that it is fully opened. Do not climb a closed stepladder.  Make sure that both sides of the stepladder are locked into place.  Ask someone to hold it for you, if possible.  Clearly mark and make sure that you can see:  Any grab bars or handrails.  First and last steps.  Where the edge of each step is.  Use tools that help you move around (mobility aids) if they are needed. These include:  Canes.  Walkers.  Scooters.  Crutches.  Turn on the lights when you go into a dark area. Replace any light bulbs as soon as they burn out.  Set up your furniture so you have a clear path. Avoid moving your furniture around.  If any of your floors are uneven, fix them.  If there are any pets around you, be aware of where they are.  Review your medicines with your doctor. Some medicines can make you feel dizzy. This can increase your chance of falling. Ask your doctor what other things that you can do to help prevent falls. This information is not intended to replace advice given to you by your health care provider. Make sure you discuss any questions you have with your health care provider. Document Released: 05/26/2009 Document Revised: 01/05/2016 Document Reviewed: 09/03/2014 Elsevier Interactive Patient Education  2017 [employer].

## 2019-11-19 NOTE — Progress Notes (Signed)
This visit is being conducted via phone call due to the COVID-19 pandemic. This patient has given me verbal consent via phone to conduct this visit, patient states they are participating from their home address. Some vital signs may be absent or patient reported.   Patient identification: identified by name, DOB, and current address.  Location provider: [facility], Office Persons participating in the virtual visit: Denman George LPN, patient, and Dr. Garret Reddish    Subjective:   [name] is a 86 y.o. male who presents for Medicare Annual/Subsequent preventive examination.  Review of Systems:   Cardiac Risk Factors include: advanced age (>77men, >43 women);hypertension;male gender    Objective:    Vitals: BP 122/82 Comment: patient reported vitals  Pulse 83   Temp (!) 96.2 F (35.7 C) (Temporal)   Ht 6\' 3"  (1.905 m)   Wt 241 lb (109.3 kg)   SpO2 94%   BMI 30.12 kg/m   Body mass index is 30.12 kg/m.  Advanced Directives 11/19/2019 07/04/2019 11/21/2016 11/14/2016 05/31/2014 09/24/2012 09/22/2012  Does Patient Have a Medical Advance Directive? Yes Yes Yes Yes Yes Patient has advance directive, copy not in chart Patient does not have advance directive  Type of Advance Directive Living will;Healthcare Power of Attorney Living will Healthcare Power of [place] will - -  Does patient want to make changes to medical advance directive? No - Patient declined - No - Patient declined No - Patient declined - - -  Copy of [place] in Chart? No - copy requested - No - copy requested No - copy requested - Copy requested from other (Comment) -  Pre-existing out of facility DNR order (yellow form or pink MOST form) - - - - - - No    Tobacco Social History   Tobacco Use  Smoking Status Never Smoker  Smokeless Tobacco Never Used     Counseling given: Not Answered   Clinical Intake:  Pre-visit preparation completed:  Yes  Pain : No/denies pain  Diabetes: No  How often do you need to have someone help you when you read instructions, pamphlets, or other written materials from your doctor or pharmacy?: 1 - Never  Interpreter Needed?: No  Information entered by :: Denman George LPN  Past Medical History:  Diagnosis Date  . Arthritis    "back, knees" (11/21/2016)  . Bladder stones   . CAD (coronary artery disease)    11/20/16 PCI with DES--> LAD/RCA  . Chronic lower back pain   . CKD (chronic kidney disease), stage IV ([place])    [name] 11/08/2016  . History of gout   . History of kidney stones 12/02/2008  . Hyperlipidemia   . Hypertension   . Myocardial infarction [facility])    "silent" (11/21/2016)  . PSA, INCREASED 12/05/2009  . UTI (urinary tract infection)    Past Surgical History:  Procedure Laterality Date  . CARDIAC CATHETERIZATION  11/14/2016  . CATARACT EXTRACTION W/ INTRAOCULAR LENS  IMPLANT, BILATERAL    . CORONARY ANGIOPLASTY WITH STENT PLACEMENT  11/21/2016   "2 stents"   . CORONARY ATHERECTOMY N/A 11/21/2016   Procedure: Coronary Atherectomy;  Surgeon: Leonie Man, MD;  Location: [place] [facility];  Service: Cardiovascular;  Laterality: N/A;  RCA and LAD  . CORONARY STENT INTERVENTION N/A 11/21/2016   Procedure: Coronary Stent Intervention;  Surgeon: Leonie Man, MD;  Location: [place] [facility];  Service: Cardiovascular;  Laterality: N/A;  RCA  and LAD  . CYSTOSCOPY W/ STONE MANIPULATION    . INTRAVASCULAR PRESSURE WIRE/FFR STUDY N/A 05/22/2019   Procedure: INTRAVASCULAR PRESSURE WIRE/FFR STUDY;  Surgeon: Nelva Bush, MD;  Location: [place] [facility];  Service: Cardiovascular;  Laterality: N/A;  . JOINT REPLACEMENT    . KNEE ARTHROSCOPY Left   . LAPAROSCOPIC CHOLECYSTECTOMY    . LEFT HEART CATH N/A 11/21/2016   Procedure: Left Heart Cath;  Surgeon: Leonie Man, MD;  Location: [place] [facility];  Service: Cardiovascular;  Laterality: N/A;  . LITHOTRIPSY    .  PILONIDAL CYST EXCISION  58  . RIGHT/LEFT HEART CATH AND CORONARY ANGIOGRAPHY N/A 11/14/2016   Procedure: Right/Left Heart Cath and Coronary Angiography;  Surgeon: Leonie Man, MD;  Location: [place] [facility];  Service: Cardiovascular;  Laterality: N/A;  . RIGHT/LEFT HEART CATH AND CORONARY ANGIOGRAPHY N/A 05/22/2019   Procedure: RIGHT/LEFT HEART CATH AND CORONARY ANGIOGRAPHY;  Surgeon: Nelva Bush, MD;  Location: [place] [facility];  Service: Cardiovascular;  Laterality: N/A;  . TEMPORARY PACEMAKER N/A 11/21/2016   Procedure: Temporary Pacemaker;  Surgeon: Leonie Man, MD;  Location: [place] [facility];  Service: Cardiovascular;  Laterality: N/A;  . TONSILLECTOMY    . TOTAL KNEE ARTHROPLASTY Left 09/22/2012   Procedure: TOTAL KNEE ARTHROPLASTY;  Surgeon: Lorn Junes, MD;  Location: [place];  Service: Orthopedics;  Laterality: Left;   Family History  Problem Relation Age of Onset  . Breast cancer Mother   . Heart attack Father        60, smoker   Social History   Socioeconomic History  . Marital status: Married    Spouse name: Not on file  . Number of children: Not on file  . Years of education: Not on file  . Highest education level: Not on file  Occupational History  . Not on file  Tobacco Use  . Smoking status: Never Smoker  . Smokeless tobacco: Never Used  Substance and Sexual Activity  . Alcohol use: Not Currently    Comment: 11/21/2016 "nothing since 2015"  . Drug use: No  . Sexual activity: Yes    Partners: Female  Other Topics Concern  . Not on file  Social History Narrative   Married (Wife June patient of Dr. Yong Channel). 2 sons. 4 grandkids.    Originally from near [place], [place] [employer] 6[place]7 months of the year      Retired from [employer] long time. 27 years of service.       Hobbies: golf   Used to [employer], run [employer]   Social Determinants of Radio broadcast assistant Strain:   . Difficulty of Paying Living  Expenses:   Food Insecurity:   . Worried About Charity fundraiser in the Last Year:   . Arboriculturist in the Last Year:   Transportation Needs:   . Film/video editor (Medical):   [name] Lack of Transportation (Non-Medical):   Physical Activity:   . Days of Exercise per Week:   . Minutes of Exercise per Session:   Stress:   . Feeling of Stress :   Social Connections:   . Frequency of Communication with Friends and Family:   . Frequency of Social Gatherings with Friends and Family:   . Attends Religious Services:   . Active Member of Clubs or Organizations:   . Attends Archivist Meetings:   [name] Marital Status:  Outpatient Encounter Medications as of 11/19/2019  Medication Sig  . allopurinol (ZYLOPRIM) 300 MG tablet TAKE 1 TABLET DAILY  . atorvastatin (LIPITOR) 40 MG tablet TAKE 1 TABLET DAILY (Patient taking differently: Take 40 mg by mouth every evening. )  . diphenhydramine-acetaminophen (TYLENOL PM) 25-500 MG TABS tablet Take 1 tablet by mouth at bedtime as needed (sleep).  [name] ELIQUIS 2.5 MG TABS tablet TAKE 1 TABLET TWICE A DAY (Patient taking differently: Take 2.5 mg by mouth 2 (two) times daily. )  . metoprolol tartrate (LOPRESSOR) 25 MG tablet TAKE 1 TABLET TWICE A DAY (Patient taking differently: Take 25 mg by mouth 2 (two) times daily. )  . nitroGLYCERIN (NITROSTAT) 0.4 MG SL tablet Place 1 tablet (0.4 mg total) under the tongue every 5 (five) minutes as needed for chest pain.  . pantoprazole (PROTONIX) 40 MG tablet Take 1 tablet (40 mg total) by mouth daily.  . ranolazine (RANEXA) 1000 MG SR tablet Take 1 tablet (1,000 mg total) by mouth 2 (two) times daily.  . Tamsulosin HCl (FLOMAX) 0.4 MG CAPS Take 0.4 mg by mouth at bedtime.   . [DISCONTINUED] guaiFENesin-codeine 100-10 MG/5ML syrup Take 5 mLs by mouth every 6 (six) hours as needed for cough. (Patient not taking: Reported on 10/01/2019)   No facility-administered encounter medications on file as of 11/19/2019.     Activities of Daily Living In your present state of health, do you have any difficulty performing the following activities: 11/19/2019  Hearing? N  Vision? N  Difficulty concentrating or making decisions? N  Walking or climbing stairs? N  Dressing or bathing? N  Doing errands, shopping? N  Preparing Food and eating ? N  Using the Toilet? N  In the past six months, have you accidently leaked urine? N  Do you have problems with loss of bowel control? N  Managing your Medications? N  Managing your Finances? N  Housekeeping or managing your Housekeeping? N  Some recent data might be hidden    Patient Care Team: Marin Olp, MD as PCP - General (Family Medicine) Constance Haw, MD as PCP - Electrophysiology (Cardiology) Jerline Pain, MD as PCP - Cardiology (Cardiology) Deterding, Jeneen Rinks, MD as Consulting Physician (Nephrology) Pa, [facility] as Consulting Physician (Ophthalmology) Alyson Ingles [name], MD as Consulting Physician (Urology)   Assessment:   This is a routine wellness examination for [place].  Exercise Activities and Dietary recommendations Current Exercise Habits: Home exercise routine, Type of exercise: walking, Time (Minutes): 30, Frequency (Times/Week): 6, Weekly Exercise (Minutes/Week): 180, Intensity: Mild  Goals   None     Fall Risk Fall Risk  11/19/2019 11/18/2018 10/04/2017 01/19/2016 12/28/2013  Falls in the past year? 0 0 No No No  Number falls in past yr: 0 - - - -  Injury with Fall? 0 - - - -  Follow up Falls evaluation completed;Education provided;Falls prevention discussed - - - -   Is the patient's home free of loose throw rugs in walkways, pet beds, electrical cords, etc?   yes      Grab bars in the bathroom? yes      Handrails on the stairs?   yes       Adequate lighting?   yes  Depression Screen PHQ 2/9 Scores 11/19/2019 09/14/2019 11/18/2018 10/04/2017  PHQ - 2 Score 0 0 0 0    Cognitive Function- no cognitive concerns at this time     6CIT Screen 11/19/2019  What Year? 0 points  What month? 0 points  What time? 0 points  Count back from 20 0 points  Months in reverse 0 points  Repeat phrase 0 points  Total Score 0    Immunization History  Administered Date(s) Administered  . Influenza Whole 04/25/2009, 06/21/2010, 07/01/2011  . Influenza, High Dose Seasonal PF 04/22/2014, 06/10/2017, 05/06/2018  . Influenza,inj,Quad PF,6+ Mos 06/17/2013  . Influenza-Unspecified 04/22/2014, 05/22/2016, 06/10/2017, 05/01/2019  . Moderna SARS-COVID-2 Vaccination 08/19/2019, 09/16/2019  . Pneumococcal Conjugate-13 12/28/2013  . Pneumococcal Polysaccharide-23 10/22/2007  . Td 12/05/2009    Qualifies for Shingles Vaccine? Discussed and patient will check with pharmacy for coverage.  Patient education handout provided   Screening Tests Health Maintenance  Topic Date Due  . TETANUS/TDAP  12/06/2019  . INFLUENZA VACCINE  03/13/2020  . PNA vac Low Risk Adult  Completed   Cancer Screenings: Lung: Low Dose CT Chest recommended if Age 67-80 years, 30 pack-year currently smoking OR have quit w/in 15years. Patient does not qualify. Colorectal: No longer indicated      Plan:  I have personally reviewed and addressed the Medicare Annual Wellness questionnaire and have noted the following in the patient's chart:  A. Medical and social history B. Use of alcohol, tobacco or illicit drugs  C. Current medications and supplements D. Functional ability and status E.  Nutritional status F.  Physical activity G. Advance directives H. List of other physicians I.  Hospitalizations, surgeries, and ER visits in previous 12 months J.  [place] such as hearing and vision if needed, cognitive and depression L. Referrals, records requested, and appointments- none   In addition, I have reviewed and discussed with patient certain preventive protocols, quality metrics, and best practice recommendations. A written personalized care plan  for preventive services as well as general preventive health recommendations were provided to patient.   Signed,  Denman George, LPN  Nurse Health Advisor   Nurse Notes: Patient would like to report that Protonix is working well but that he can tell that it has worn off by dinner.  He has been supplementing with Gaviscon.

## 2019-11-24 DIAGNOSIS — R319 Hematuria, unspecified: Secondary | ICD-10-CM | POA: Diagnosis not present

## 2019-11-24 DIAGNOSIS — Z136 Encounter for screening for cardiovascular disorders: Secondary | ICD-10-CM | POA: Diagnosis not present

## 2019-11-24 DIAGNOSIS — N390 Urinary tract infection, site not specified: Secondary | ICD-10-CM | POA: Diagnosis not present

## 2019-11-30 DIAGNOSIS — N390 Urinary tract infection, site not specified: Secondary | ICD-10-CM | POA: Diagnosis not present

## 2019-12-04 ENCOUNTER — Telehealth: Payer: Self-pay | Admitting: Family Medicine

## 2019-12-04 NOTE — Telephone Encounter (Signed)
Patient called in and made appt for 12/08/19 at 8:40am for a f/u on Deep Cough patient would like to come in office. Please advise.

## 2019-12-04 NOTE — Telephone Encounter (Signed)
OK for patient to come in office? Please advise

## 2019-12-05 ENCOUNTER — Other Ambulatory Visit: Payer: Self-pay | Admitting: Family Medicine

## 2019-12-05 NOTE — Telephone Encounter (Signed)
If this is persistence of his chronic cough from February he may come in.  If he has had recent worsening or this is a new cough please get him tested before the visit

## 2019-12-07 NOTE — Telephone Encounter (Signed)
Pt states he is just wanting to f/u on the cough, its not new.

## 2019-12-08 ENCOUNTER — Ambulatory Visit (INDEPENDENT_AMBULATORY_CARE_PROVIDER_SITE_OTHER): Payer: Medicare Other | Admitting: Family Medicine

## 2019-12-08 ENCOUNTER — Ambulatory Visit (INDEPENDENT_AMBULATORY_CARE_PROVIDER_SITE_OTHER): Payer: Medicare Other

## 2019-12-08 ENCOUNTER — Telehealth: Payer: Self-pay | Admitting: Family Medicine

## 2019-12-08 ENCOUNTER — Encounter: Payer: Self-pay | Admitting: Family Medicine

## 2019-12-08 ENCOUNTER — Other Ambulatory Visit: Payer: Self-pay

## 2019-12-08 VITALS — BP 124/78 | HR 88 | Temp 98.0°F | Ht 75.0 in | Wt 245.8 lb

## 2019-12-08 DIAGNOSIS — R05 Cough: Secondary | ICD-10-CM

## 2019-12-08 DIAGNOSIS — N183 Chronic kidney disease, stage 3 unspecified: Secondary | ICD-10-CM

## 2019-12-08 DIAGNOSIS — I495 Sick sinus syndrome: Secondary | ICD-10-CM | POA: Diagnosis not present

## 2019-12-08 DIAGNOSIS — I25119 Atherosclerotic heart disease of native coronary artery with unspecified angina pectoris: Secondary | ICD-10-CM | POA: Diagnosis not present

## 2019-12-08 DIAGNOSIS — I2782 Chronic pulmonary embolism: Secondary | ICD-10-CM

## 2019-12-08 DIAGNOSIS — R053 Chronic cough: Secondary | ICD-10-CM

## 2019-12-08 MED ORDER — BENZONATATE 100 MG PO CAPS
100.0000 mg | ORAL_CAPSULE | Freq: Two times a day (BID) | ORAL | 0 refills | Status: DC | PRN
Start: 2019-12-08 — End: 2020-02-03

## 2019-12-08 MED ORDER — FLUTICASONE PROPIONATE 50 MCG/ACT NA SUSP: 2.0000 | Freq: Every day | NASAL | 6 refills | Status: DC

## 2019-12-08 MED ORDER — PREDNISONE 20 MG PO TABS: 20.0000 mg | ORAL_TABLET | Freq: Every day | ORAL | 0 refills | Status: DC

## 2019-12-08 NOTE — Assessment & Plan Note (Signed)
Stable chronic shortness of breath-I think this is related to his CAD and deconditioning and not chronic PE.  Compliant with Eliquis.  Remains on this long-term.  No longer on Plavix or aspirin.  Pulmonary embolism was thought provoked from travel but plan has just been to continue this long-term to avoid recurrence

## 2019-12-08 NOTE — Progress Notes (Signed)
Notes from Dr. Yong Channel No obvious pneumonia.  Mild enlargement of the heart-you already follow-up with cardiology.  Your right hemidiaphragm is high-sometimes that can cause shortness of breath-may be worth getting pulmonology's opinion if cough does not resolve

## 2019-12-08 NOTE — Progress Notes (Signed)
Phone [phone] In person visit   Subjective:   [name] is a 86 y.o. year old very pleasant male patient who presents for/with See problem oriented charting Chief Complaint  Patient presents with  . Cough   I,Joellen Y Thompson,acting as a scribe for Garret Reddish, MD.,have documented all relevant documentation on the behalf of Garret Reddish, MD,as directed by  Garret Reddish, MD while in the presence of Garret Reddish, MD.  This visit occurred during the SARS-CoV-2 public health emergency.  Safety protocols were in place, including screening questions prior to the visit, additional usage of staff PPE, and extensive cleaning of exam room while observing appropriate contact time as indicated for disinfecting solutions.   Past Medical History-  Patient Active Problem List   Diagnosis Date Noted  . Sick sinus syndrome ([place]) 10/05/2017    Priority: High  . Chronic pulmonary embolism ([place]) 12/17/2016    Priority: High  . Status post coronary artery stent placement     Priority: High  . Coronary artery disease involving native coronary artery with angina pectoris ([place]) 11/21/2016    Priority: High  . Mild aortic stenosis 10/31/2016    Priority: High  . Exertional shortness of breath: Concern for Anginal Equivalent (class II-III) 10/30/2016    Priority: High  . CKD (chronic kidney disease), stage III 09/22/2012    Priority: High  . Gout 12/13/2010    Priority: Medium  . BPH (benign prostatic hyperplasia) 12/05/2009    Priority: Medium  . Hyperlipidemia 05/08/2007    Priority: Medium  . Essential hypertension 05/08/2007    Priority: Medium  . Edema 12/08/2014    Priority: Low  . Healthcare maintenance 12/08/2014    Priority: Low  . Left knee DJD 09/22/2012    Priority: Low  . Dilated aortic root ([place]) 11/08/2016    Medications- reviewed and updated Current Outpatient Medications  Medication Sig Dispense Refill  . allopurinol (ZYLOPRIM) 300 MG tablet TAKE 1  TABLET DAILY 90 tablet 3  . atorvastatin (LIPITOR) 40 MG tablet TAKE 1 TABLET DAILY (Patient taking differently: Take 40 mg by mouth every evening. ) 90 tablet 3  . diphenhydramine-acetaminophen (TYLENOL PM) 25-500 MG TABS tablet Take 1 tablet by mouth at bedtime as needed (sleep).    [name] ELIQUIS 2.5 MG TABS tablet TAKE 1 TABLET TWICE A DAY (Patient taking differently: Take 2.5 mg by mouth 2 (two) times daily. ) 180 tablet 3  . metoprolol tartrate (LOPRESSOR) 25 MG tablet TAKE 1 TABLET TWICE A DAY (Patient taking differently: Take 25 mg by mouth 2 (two) times daily. ) 180 tablet 3  . nitroGLYCERIN (NITROSTAT) 0.4 MG SL tablet Place 1 tablet (0.4 mg total) under the tongue every 5 (five) minutes as needed for chest pain. 25 tablet 11  . pantoprazole (PROTONIX) 40 MG tablet Take 1 tablet (40 mg total) by mouth daily. 30 tablet 3  . ranolazine (RANEXA) 1000 MG SR tablet Take 1 tablet (1,000 mg total) by mouth 2 (two) times daily. 180 tablet 3  . Tamsulosin HCl (FLOMAX) 0.4 MG CAPS Take 0.4 mg by mouth at bedtime.      No current facility-administered medications for this visit.     Objective:  BP 124/78   Pulse 88   Temp 98 F (36.7 C) (Temporal)   Ht 6\' 3"  (1.905 m)   Wt 245 lb 12.8 oz (111.5 kg)   SpO2 95%   BMI 30.72 kg/m  Gen: NAD, resting comfortably CV: RRR no murmurs rubs  or gallops Lungs: CTAB no crackles, wheeze, rhonchi Abdomen: soft/nontender/nondistended/normal bowel sounds. No rebound or guarding.  Ext: Trace edema-left worse than right    Assessment and Plan   #Chronic cough  S:Cough started back in January. At that time he was started on Doxycycline. He did not have any improvement with treatment.  [place] Told [place] it may be reflux he has been taking something thinks it was omeprazole but he stopped taking was not helpful.  He has a history of reflux and we had pantoprazole 40 mg on his list but He is currently not taking Pantoprazole as he did  not find this helpful. He went to dentist and he informed him that he had dry mouth and reflux as well. Has some post nasal drip at night when laying down.   Describes cough as dry not productive.  He is not currently on any medications for allergies. Patient denies any wheezing.  Patient does have some shortness of breath but this is stable at his regular baseline.  A/P: 86 year old male with chronic cough since January.  He is not on an ACE inhibitor.  He does not have a history of asthma.  We will get chest xray today. Start on Flonase, and over the counter allergy medications with out decongestant to treat potential allergies. We will also call in a prednisone taper for potential post viral cough and order chest x ray today.  Restart pantoprazole for acid reflux.  If no improvement in 3-4 weeks call for referral to pulmonology.  I also sent in Tessalon for him-I would like for him to take this twice a day for a week.  He has had palpitations with procaine in the past-if this recurs he should stop medication  # Constipation  S:symptoms started about a month ago. Started when he started the Ranexa. Has tried colace, Mirarlax and now is on dulcolax at nigh x3 t. Has not had bowel movement in three days. He is only having one bowel movement every two to three days in general. When he does have one it is hard small balls. He has increased his water intake. He has had abdominal pain-was particularly painful last night before he passed flatulence. A/P: New onset constipation since starting Ranexa.  He will send message to Cardiology to see if another medication can be tried Imdur in the past but caused headaches.  I do not think there is another option here so we may just have to treat through the constipation.   Continue Miralax 2 caps a day for 3 days then transition to once daily (hold for loose stools for 1 day) until he has normal bowel movement with dulcolax at night.  Then will go back to one cap as  long as needed.  Chronic pulmonary embolism (HCC) Stable chronic shortness of breath-I think this is related to his CAD and deconditioning and not chronic PE.  Compliant with Eliquis.  Remains on this long-term.  No longer on Plavix or aspirin.  Pulmonary embolism was thought provoked from travel but plan has just been to continue this long-term to avoid recurrence  Coronary artery disease involving native coronary artery with angina pectoris [facility]) Patient is not on aspirin or Plavix-cardiology has been okay with Eliquis alone.  Patient was stable baseline shortness of breath.  He is compliant with statin with LDL goal under 70.  Ranexa is controlling/preventing anginal symptoms but he has had significant constipation with this-he is going to reach  out to cardiology about this concern    Recommended follow up: as needed if symptoms not improving- in general would recommend every 6 month visits but this was not formally recommended to patient at time of visit Future Appointments  Date Time Provider [place]  01/06/2020  7:30 AM [facility] DEVICE REMOTES [facility]  04/06/2020  7:30 AM [facility] DEVICE REMOTES [facility]  07/06/2020  7:30 AM [facility] DEVICE REMOTES [facility]    Lab/Order associations:   ICD-10-CM   1. Chronic cough  R05 DG Chest 2 View  2. Other chronic pulmonary embolism without acute cor pulmonale (HCC) Chronic I27.82   3. Sick sinus syndrome (HCC) Chronic  stable with pacemaker in place.  Continue close cardiology follow-up I49.5   4. Coronary artery disease involving native coronary artery of native heart with angina pectoris (HCC) Chronic I25.119   6. Stage 3 chronic kidney disease, unspecified whether stage 3a or 3b CKD   Stable on last check with GFR over 30.  Patient should avoid NSAIDs.  Continue to monitor-blood work next visit likely N18.30     Meds ordered this encounter  Medications  . fluticasone  (FLONASE) 50 MCG/ACT nasal spray    Sig: Place 2 sprays into both nostrils daily.    Dispense:  16 g    Refill:  6  . predniSONE (DELTASONE) 20 MG tablet    Sig: Take 1 tablet (20 mg total) by mouth daily with breakfast. Take two tab for 3 days then one tab for 4 days    Dispense:  10 tablet    Refill:  0  . benzonatate (TESSALON) 100 MG capsule    Sig: Take 1 capsule (100 mg total) by mouth 2 (two) times daily as needed for cough.    Dispense:  20 capsule    Refill:  0   The entirety of the information documented in the History of Present Illness, Review of Systems and Physical Exam were personally obtained by me. Portions of this information were initially documented by the CMA and reviewed by me for thoroughness and accuracy.   Return precautions advised.  Garret Reddish, MD

## 2019-12-08 NOTE — Telephone Encounter (Signed)
[facility] is calling in asking for clarification on the directions for the patients predniSONE (DELTASONE) 20 MG tablet, asked if someone could call them back.

## 2019-12-08 NOTE — Patient Instructions (Addendum)
Health Maintenance Due  Topic Date Due  . TETANUS/TDAP Will get at pharmacy.  12/06/2019   Recommended follow up: Send my chart message in about 3 weeks and let me know how you are doing so that we can make changes to medications  -See [place] back sooner if you have new or worsening symptoms  We are going to get xray today   Start on allergy medications with out decongestant= Xyzal, Claritin, Zyrtec, Allegra are all fine-can also just purchase the generic  Start on Flonase   Restart on Pantoprazole   Take prednisone    Miralax 2 caps a day for 3 days until he has normal bowel movement with dulcolax at night then start with miralax once a day  Call Cardiology to see if another option to replace the Renexa

## 2019-12-08 NOTE — Telephone Encounter (Signed)
Called and clarified Rx with pharmacy.

## 2019-12-08 NOTE — Assessment & Plan Note (Signed)
Patient is not on aspirin or Plavix-cardiology has been okay with Eliquis alone.  Patient was stable baseline shortness of breath.  He is compliant with statin with LDL goal under 70.  Ranexa is controlling/preventing anginal symptoms but he has had significant constipation with this-he is going to reach out to cardiology about this concern

## 2019-12-21 ENCOUNTER — Encounter: Payer: Self-pay | Admitting: Family Medicine

## 2019-12-24 ENCOUNTER — Other Ambulatory Visit: Payer: Self-pay

## 2019-12-24 ENCOUNTER — Encounter: Payer: Self-pay | Admitting: Family Medicine

## 2019-12-24 DIAGNOSIS — R053 Chronic cough: Secondary | ICD-10-CM

## 2020-01-05 DIAGNOSIS — Z6830 Body mass index (BMI) 30.0-30.9, adult: Secondary | ICD-10-CM | POA: Diagnosis not present

## 2020-01-05 DIAGNOSIS — R309 Painful micturition, unspecified: Secondary | ICD-10-CM | POA: Diagnosis not present

## 2020-01-05 DIAGNOSIS — Z136 Encounter for screening for cardiovascular disorders: Secondary | ICD-10-CM | POA: Diagnosis not present

## 2020-01-06 ENCOUNTER — Ambulatory Visit (INDEPENDENT_AMBULATORY_CARE_PROVIDER_SITE_OTHER): Payer: Medicare Other | Admitting: *Deleted

## 2020-01-06 DIAGNOSIS — I495 Sick sinus syndrome: Secondary | ICD-10-CM | POA: Diagnosis not present

## 2020-01-06 LAB — CUP PACEART REMOTE DEVICE CHECK
Battery Remaining Longevity: 83 mo
Battery Voltage: 2.94 V
Brady Statistic AP VP Percent: 4.12 %
Brady Statistic AP VS Percent: 93.12 %
Brady Statistic AS VP Percent: 0.04 %
Brady Statistic AS VS Percent: 2.72 %
Brady Statistic RA Percent Paced: 97.23 %
Brady Statistic RV Percent Paced: 4.16 %
Date Time Interrogation Session: 20210526073600
Implantable Lead Implant Date: 20190116
Implantable Lead Implant Date: 20190116
Implantable Lead Location: 753859
Implantable Lead Location: 753860
Implantable Lead Model: 5076
Implantable Lead Model: 5076
Implantable Pulse Generator Implant Date: 20190116
Lead Channel Impedance Value: 304 Ohm
Lead Channel Impedance Value: 304 Ohm
Lead Channel Impedance Value: 342 Ohm
Lead Channel Impedance Value: 418 Ohm
Lead Channel Pacing Threshold Amplitude: 1 V
Lead Channel Pacing Threshold Amplitude: 1.75 V
Lead Channel Pacing Threshold Pulse Width: 0.4 ms
Lead Channel Pacing Threshold Pulse Width: 0.4 ms
Lead Channel Sensing Intrinsic Amplitude: 0.375 mV
Lead Channel Sensing Intrinsic Amplitude: 0.375 mV
Lead Channel Sensing Intrinsic Amplitude: 5.75 mV
Lead Channel Sensing Intrinsic Amplitude: 5.75 mV
Lead Channel Setting Pacing Amplitude: 2 V
Lead Channel Setting Pacing Amplitude: 3.5 V
Lead Channel Setting Pacing Pulse Width: 0.4 ms
Lead Channel Setting Sensing Sensitivity: 1.2 mV

## 2020-01-07 NOTE — Progress Notes (Signed)
Remote pacemaker transmission.   

## 2020-01-14 DIAGNOSIS — Z136 Encounter for screening for cardiovascular disorders: Secondary | ICD-10-CM | POA: Diagnosis not present

## 2020-01-14 DIAGNOSIS — R42 Dizziness and giddiness: Secondary | ICD-10-CM | POA: Diagnosis not present

## 2020-01-14 DIAGNOSIS — R05 Cough: Secondary | ICD-10-CM | POA: Diagnosis not present

## 2020-01-14 DIAGNOSIS — I2510 Atherosclerotic heart disease of native coronary artery without angina pectoris: Secondary | ICD-10-CM | POA: Diagnosis not present

## 2020-01-14 DIAGNOSIS — Z6830 Body mass index (BMI) 30.0-30.9, adult: Secondary | ICD-10-CM | POA: Diagnosis not present

## 2020-01-14 DIAGNOSIS — R0600 Dyspnea, unspecified: Secondary | ICD-10-CM | POA: Diagnosis not present

## 2020-01-20 ENCOUNTER — Ambulatory Visit (HOSPITAL_COMMUNITY): Payer: Medicare Other | Attending: Cardiology

## 2020-01-20 ENCOUNTER — Other Ambulatory Visit: Payer: Self-pay

## 2020-01-20 ENCOUNTER — Telehealth: Payer: Self-pay

## 2020-01-20 DIAGNOSIS — I350 Nonrheumatic aortic (valve) stenosis: Secondary | ICD-10-CM | POA: Diagnosis not present

## 2020-01-20 NOTE — Telephone Encounter (Signed)
The patient has been notified of the Echo result and verbalized understanding.  All questions (if any) were answered. Frederik Schmidt, RN 01/20/2020 1:08 PM

## 2020-01-20 NOTE — Telephone Encounter (Signed)
-----   Message from Jerline Pain, MD sent at 01/20/2020  1:05 PM EDT ----- Aortic stenosis remains moderate.  [name], MD

## 2020-01-24 DIAGNOSIS — Z86711 Personal history of pulmonary embolism: Secondary | ICD-10-CM | POA: Diagnosis not present

## 2020-01-24 DIAGNOSIS — N184 Chronic kidney disease, stage 4 (severe): Secondary | ICD-10-CM | POA: Diagnosis not present

## 2020-01-24 DIAGNOSIS — N3001 Acute cystitis with hematuria: Secondary | ICD-10-CM | POA: Diagnosis not present

## 2020-01-24 DIAGNOSIS — I10 Essential (primary) hypertension: Secondary | ICD-10-CM | POA: Diagnosis not present

## 2020-01-24 DIAGNOSIS — J9601 Acute respiratory failure with hypoxia: Secondary | ICD-10-CM | POA: Diagnosis not present

## 2020-01-24 DIAGNOSIS — Z86718 Personal history of other venous thrombosis and embolism: Secondary | ICD-10-CM | POA: Diagnosis not present

## 2020-01-24 DIAGNOSIS — I25118 Atherosclerotic heart disease of native coronary artery with other forms of angina pectoris: Secondary | ICD-10-CM | POA: Diagnosis not present

## 2020-01-25 DIAGNOSIS — J9601 Acute respiratory failure with hypoxia: Secondary | ICD-10-CM | POA: Diagnosis not present

## 2020-01-25 DIAGNOSIS — N3001 Acute cystitis with hematuria: Secondary | ICD-10-CM | POA: Diagnosis not present

## 2020-01-25 DIAGNOSIS — N184 Chronic kidney disease, stage 4 (severe): Secondary | ICD-10-CM | POA: Diagnosis not present

## 2020-01-25 DIAGNOSIS — I25118 Atherosclerotic heart disease of native coronary artery with other forms of angina pectoris: Secondary | ICD-10-CM | POA: Diagnosis not present

## 2020-01-26 ENCOUNTER — Telehealth: Payer: Self-pay | Admitting: Family Medicine

## 2020-01-26 ENCOUNTER — Telehealth: Payer: Self-pay | Admitting: Cardiology

## 2020-01-26 DIAGNOSIS — J9601 Acute respiratory failure with hypoxia: Secondary | ICD-10-CM | POA: Diagnosis not present

## 2020-01-26 DIAGNOSIS — I25118 Atherosclerotic heart disease of native coronary artery with other forms of angina pectoris: Secondary | ICD-10-CM | POA: Diagnosis not present

## 2020-01-26 DIAGNOSIS — N3001 Acute cystitis with hematuria: Secondary | ICD-10-CM | POA: Diagnosis not present

## 2020-01-26 DIAGNOSIS — N184 Chronic kidney disease, stage 4 (severe): Secondary | ICD-10-CM | POA: Diagnosis not present

## 2020-01-26 NOTE — Telephone Encounter (Signed)
Patient is calling in asking for an appointment for hospital follow up, but Dr.hunter is booked but patient states that he will send a mychart message addressing his concerns and what all has been happening.

## 2020-01-26 NOTE — Telephone Encounter (Signed)
Will still need visit to address concerns, ok to use SDA.

## 2020-01-26 NOTE — Telephone Encounter (Signed)
Left message for pt to call back to discuss scheduling of f/u appt.

## 2020-01-26 NOTE — Telephone Encounter (Signed)
New Message::     Pt was discharged from  [facility] today. He was told to follow up with his Cardiologist. The first available appt we have is 02-22-20 with any APP at this time. Pt is very concerned about not getting until 02-22-20.

## 2020-01-27 ENCOUNTER — Encounter: Payer: Self-pay | Admitting: Family Medicine

## 2020-01-27 NOTE — Telephone Encounter (Signed)
LVM asking patient to call the office back to get scheduled.

## 2020-01-27 NOTE — Telephone Encounter (Signed)
Follow up  Pt returning call from [facility], he said he will just send a message to Dr. Marlou Porch

## 2020-01-28 NOTE — Telephone Encounter (Signed)
Patient is scheduled for 01/29/20

## 2020-01-29 ENCOUNTER — Other Ambulatory Visit: Payer: Self-pay

## 2020-01-29 ENCOUNTER — Ambulatory Visit (INDEPENDENT_AMBULATORY_CARE_PROVIDER_SITE_OTHER): Payer: Medicare Other | Admitting: Family Medicine

## 2020-01-29 ENCOUNTER — Encounter: Payer: Self-pay | Admitting: Family Medicine

## 2020-01-29 VITALS — BP 126/80 | HR 60 | Temp 98.1°F | Ht 75.0 in | Wt 238.0 lb

## 2020-01-29 DIAGNOSIS — I25119 Atherosclerotic heart disease of native coronary artery with unspecified angina pectoris: Secondary | ICD-10-CM | POA: Diagnosis not present

## 2020-01-29 DIAGNOSIS — N390 Urinary tract infection, site not specified: Secondary | ICD-10-CM

## 2020-01-29 DIAGNOSIS — R351 Nocturia: Secondary | ICD-10-CM

## 2020-01-29 DIAGNOSIS — R05 Cough: Secondary | ICD-10-CM

## 2020-01-29 DIAGNOSIS — N401 Enlarged prostate with lower urinary tract symptoms: Secondary | ICD-10-CM

## 2020-01-29 DIAGNOSIS — I10 Essential (primary) hypertension: Secondary | ICD-10-CM | POA: Diagnosis not present

## 2020-01-29 DIAGNOSIS — N3020 Other chronic cystitis without hematuria: Secondary | ICD-10-CM | POA: Diagnosis not present

## 2020-01-29 DIAGNOSIS — I350 Nonrheumatic aortic (valve) stenosis: Secondary | ICD-10-CM

## 2020-01-29 DIAGNOSIS — I2782 Chronic pulmonary embolism: Secondary | ICD-10-CM

## 2020-01-29 LAB — CBC WITH DIFFERENTIAL/PLATELET
Basophils Absolute: 0 10*3/uL (ref 0.0–0.1)
Basophils Relative: 0.4 % (ref 0.0–3.0)
Eosinophils Absolute: 0.2 10*3/uL (ref 0.0–0.7)
Eosinophils Relative: 2.2 % (ref 0.0–5.0)
HCT: 39.6 % (ref 39.0–52.0)
Hemoglobin: 13.5 g/dL (ref 13.0–17.0)
Lymphocytes Relative: 21.7 % (ref 12.0–46.0)
Lymphs Abs: 2.1 10*3/uL (ref 0.7–4.0)
MCHC: 34 g/dL (ref 30.0–36.0)
MCV: 95 fl (ref 78.0–100.0)
Monocytes Absolute: 1 10*3/uL (ref 0.1–1.0)
Monocytes Relative: 10.6 % (ref 3.0–12.0)
Neutro Abs: 6.4 10*3/uL (ref 1.4–7.7)
Neutrophils Relative %: 65.1 % (ref 43.0–77.0)
Platelets: 208 10*3/uL (ref 150.0–400.0)
RBC: 4.17 Mil/uL — ABNORMAL LOW (ref 4.22–5.81)
RDW: 14.5 % (ref 11.5–15.5)
WBC: 9.8 10*3/uL (ref 4.0–10.5)

## 2020-01-29 LAB — COMPREHENSIVE METABOLIC PANEL
ALT: 24 U/L (ref 0–53)
AST: 25 U/L (ref 0–37)
Albumin: 3.8 g/dL (ref 3.5–5.2)
Alkaline Phosphatase: 100 U/L (ref 39–117)
BUN: 39 mg/dL — ABNORMAL HIGH (ref 6–23)
CO2: 28 mEq/L (ref 19–32)
Calcium: 9.2 mg/dL (ref 8.4–10.5)
Chloride: 104 mEq/L (ref 96–112)
Creatinine, Ser: 1.79 mg/dL — ABNORMAL HIGH (ref 0.40–1.50)
GFR: 36.34 mL/min — ABNORMAL LOW (ref >60.00)
Glucose, Bld: 82 mg/dL (ref 70–99)
Potassium: 4.1 mEq/L (ref 3.5–5.1)
Sodium: 136 mEq/L (ref 135–145)
Total Bilirubin: 0.7 mg/dL (ref 0.2–1.2)
Total Protein: 6.3 g/dL (ref 6.0–8.3)

## 2020-01-29 MED ORDER — APIXABAN 5 MG PO TABS: 5.0000 mg | ORAL_TABLET | Freq: Two times a day (BID) | ORAL | 3 refills | Status: DC

## 2020-01-29 NOTE — Patient Instructions (Addendum)
Check with pharmacy about Tdap and Shingles.   Great to see you today-glad you have had close follow-up with urology and seem to be making some progress.  Keep me updated on your CT scan.  I have messaged Dr. Marlou Porch today.  I think [facility] made the right decision in increasing your Eliquis but I want to confirm from his perspective.  Continue current medications for now-remain off losartan.  Remain on metoprolol.  Finish the antibiotic and certainly let urology know if you have worsening symptoms-obviously IBS that is well but I think they are the primary driver for ongoing UTI issues.

## 2020-01-29 NOTE — Progress Notes (Signed)
Phone [phone] In person visit   Subjective:   [name] is a 86 y.o. year old very pleasant male patient who presents for/with See problem oriented charting/hospital follow up  This visit occurred during the SARS-CoV-2 public health emergency.  Safety protocols were in place, including screening questions prior to the visit, additional usage of staff PPE, and extensive cleaning of exam room while observing appropriate contact time as indicated for disinfecting solutions.   Past Medical History-  Patient Active Problem List   Diagnosis Date Noted  . Sick sinus syndrome ([place]) 10/05/2017    Priority: High  . Chronic pulmonary embolism ([place]) 12/17/2016    Priority: High  . Status post coronary artery stent placement     Priority: High  . Coronary artery disease involving native coronary artery with angina pectoris ([place]) 11/21/2016    Priority: High  . Moderate aortic stenosis 10/31/2016    Priority: High  . Exertional shortness of breath: Concern for Anginal Equivalent (class II-III) 10/30/2016    Priority: High  . CKD (chronic kidney disease), stage III 09/22/2012    Priority: High  . Gout 12/13/2010    Priority: Medium  . BPH (benign prostatic hyperplasia) 12/05/2009    Priority: Medium  . Hyperlipidemia 05/08/2007    Priority: Medium  . Essential hypertension 05/08/2007    Priority: Medium  . Edema 12/08/2014    Priority: Low  . Healthcare maintenance 12/08/2014    Priority: Low  . Left knee DJD 09/22/2012    Priority: Low  . Dilated aortic root ([place]) 11/08/2016    Medications- reviewed and updated Current Outpatient Medications  Medication Sig Dispense Refill  . allopurinol (ZYLOPRIM) 300 MG tablet TAKE 1 TABLET DAILY 90 tablet 3  . atorvastatin (LIPITOR) 40 MG tablet TAKE 1 TABLET DAILY 90 tablet 3  . benzonatate (TESSALON) 100 MG capsule Take 1 capsule (100 mg total) by mouth 2 (two) times daily as needed for cough. 20 capsule 0  .  diphenhydramine-acetaminophen (TYLENOL PM) 25-500 MG TABS tablet Take 1 tablet by mouth at bedtime as needed (sleep).    [name] ELIQUIS 2.5 MG TABS tablet TAKE 1 TABLET TWICE A DAY (Patient taking differently: Take 2.5 mg by mouth 2 (two) times daily. ) 180 tablet 3  . fluticasone (FLONASE) 50 MCG/ACT nasal spray Place 2 sprays into both nostrils daily. 16 g 6  . metoprolol tartrate (LOPRESSOR) 25 MG tablet TAKE 1 TABLET TWICE A DAY (Patient taking differently: Take 25 mg by mouth 2 (two) times daily. ) 180 tablet 3  . nitroGLYCERIN (NITROSTAT) 0.4 MG SL tablet Place 1 tablet (0.4 mg total) under the tongue every 5 (five) minutes as needed for chest pain. 25 tablet 11  . pantoprazole (PROTONIX) 40 MG tablet Take 1 tablet (40 mg total) by mouth daily. 30 tablet 3  . predniSONE (DELTASONE) 20 MG tablet Take 1 tablet (20 mg total) by mouth daily with breakfast. Take two tab for 3 days then one tab for 4 days 10 tablet 0  . ranolazine (RANEXA) 1000 MG SR tablet Take 1 tablet (1,000 mg total) by mouth 2 (two) times daily. 180 tablet 3  . Tamsulosin HCl (FLOMAX) 0.4 MG CAPS Take 0.4 mg by mouth at bedtime.      No current facility-administered medications for this visit.     Objective:  BP 126/80   Pulse 60   Temp 98.1 F (36.7 C) (Temporal)   Ht 6\' 3"  (1.905 m)   Wt 238 lb (108  kg)   SpO2 95%   BMI 29.75 kg/m  Gen: NAD, resting comfortably CV: RRR no murmurs rubs or gallops Lungs: CTAB no crackles, wheeze, rhonchi Abdomen: soft/nontender/nondistended/normal bowel sounds.  trace to 1+ edema bilaterally. No calf pain Skin: warm, dry Neuro: grossly normal, moves all extremities Rectal: normal tone, diffusely enlarged prostate, no masses or tenderness. Wearing adult diapers due to incontinence     Assessment and Plan   # [facility] follow up for UTI and shortness of breath S: Patient was hospitalized for acute cystitis with hematuria as well as acute respiratory failure with  hypoxia.  In regards to acute cystitis with hematuria patient reports being treated for UTIs 3 times in the last 8 months-November 2020 treated with Keflex but ultimately did not have UTI on cuture- treated in June at [facility] with keflex and then most recent hospitalization.  This actually may simply be one episode total since no true UTI in last November and most recently when he completed his second course of antibiotics for UTI symptoms recur immediately after stopping antibiotic.  Fortunately patient did not have elevated lactic acid or other indicators for sepsis.  Patient has been scheduled on January 25, 2020 for urinary issues with urology-fortunately he has been able to see them since discharge.  Blood cultures were negative.  Urine culture showed 100,000 colonies of Klebsiella oxytoca pansensitive except ampicillin.  He received 3 days of IV Rocephin and then transitioned to 10-day course of cefdinir 300 mg twice daily.  Urology today did a ultrasound which showed distended bladder-plan for CT next week and asked him to keep me updated on this.   Patient with acute respiratory failure with hypoxia without clear etiology.  Patient reports shortness of breath with exertion that is worsening 5 days prior to admission.  He had recently come back from [place] a few days ago-for fever also did have prolonged travel.  Has a history of heart disease-recent echocardiogram January 20, 2020 with moderate aortic stenosis but otherwise unremarkable.  Cardiac catheterization most recently completed October 2020 with mild to moderate nonobstructive coronary artery disease.  D-dimer was elevated.  Chest CT scan was ordered and showed known right elevated hemidiaphragm.  VQ scan obtained due to history of pulmonary embolisms.  Findings showed filling defects in exactly the same place as prior known pulmonary embolism so did not get treated for acute PE.  They thought his shortness of breath could be related to chronic  pulmonary embolism in addition to heart disease.  Patient required oxygen in the hospital but was able to be discharged home without oxygen.  Patient also had bilateral lower extremity duplex which showed chronic appearing thrombus in left lower side.  Patient had been on Eliquis 2.5 mg twice daily but was transitioned to 5 mg twice daily for DVT/PE indication.  I refilled at this higher level today with plan to chronically continue this.  A/P: Patient is completing antibiotics for complicated UTI and has close urology follow-up.  BPH on exam without prostatitis-continue Flomax.  For acute respiratory failure-may be a combination of chronic PE and underlying heart disease.  We did decide to increase his Eliquis to 5 mg twice daily chronically as reduced dose of 2.5 mg twice daily is primarily for nonvalvular atrial fibrillation.  I did not opt to increase all the way to 10 mg for 7 days as I do believe DVT and PE are chronic but just in case they are contributing to his shortness of breath  prefer higher dose of 5 mg twice daily at least for now unless shortness of breath improves  Also please see full discharge summary through care everywhere.  I also want to thank cardiologist Dr. Marlou Porch who was medicated with about Eliquis dosing-he was okay with at least short-term increasing to 5 mg twice daily-I am personally leaning toward continuing this long-term unless he strongly prefers lower dose-does not look like renal adjustments are required for DVT/PE indication and he does not have atrial fibrillation  #hypertension S: medication: metoprolol 25mg  BID- was told to hold for BP <110 but BP has been ok. Not on losartan 25mg - BP has gone low in the past- so he decided not to start as recommended by [place] readings #s: Reports blood pressures have improved with improved hydration-have been slightly lower BP Readings from Last 3 Encounters:  01/29/20 126/80  12/08/19 124/78   11/19/19 122/82  A/P: Controlled-continue current medication  Recommended follow up:  Future Appointments  Date Time Provider [place]  02/03/2020 10:30 AM Collene Gobble, MD [facility] None  04/06/2020  7:30 AM [facility] DEVICE REMOTES [facility]  07/06/2020  7:30 AM [facility] DEVICE REMOTES [facility]    Lab/Order associations:   ICD-10-CM   1. Chronic cough  R05   2. Coronary artery disease involving native coronary artery of native heart with angina pectoris ([place])  I25.119   3. Essential hypertension  I10 CBC with Differential/Platelet    Comprehensive metabolic panel  4. Other chronic pulmonary embolism without acute cor pulmonale (HCC)  I27.82   5. Moderate aortic stenosis  I35.0     Meds ordered this encounter  Medications  . apixaban (ELIQUIS) 5 MG TABS tablet    Sig: Take 1 tablet (5 mg total) by mouth 2 (two) times daily.    Dispense:  180 tablet    Refill:  3   Time Spent: 49 minutes of total time (1:47 PM- 2:36 PM) was spent on the date of the encounter performing the following actions: chart review prior to seeing the patient, obtaining history, performing a medically necessary exam, counseling on the treatment plan, placing orders, and documenting in our EHR. Additional time spent on 01/30/20 completing documentation not included in time above  Return precautions advised.  Garret Reddish, MD

## 2020-01-30 NOTE — Assessment & Plan Note (Signed)
Could be contributing to SOB- he will follow up with cardiology

## 2020-01-30 NOTE — Telephone Encounter (Signed)
Dr. Yong Channel and Mr. Dredge  Reviewed records.  Suspicion was that there was no new PE (chronic changes from prior PE on VQ)  If this is the case, the Eliquis of 2.5 BID was being used as prophylaxis for future clots, not for acute treatment of new clot.  One suggestion is to continue with with 5mg  BID for 2 weeks and then decrease back to 2.5 BID.   OK with metoprolol use continued (more for heart rate and antianginal support)   OK with not using losartan given renal function   [name], MD

## 2020-01-30 NOTE — Assessment & Plan Note (Signed)
[facility] follow up for UTI and shortness of breath S: Patient was hospitalized for acute cystitis with hematuria as well as acute respiratory failure with hypoxia.  In regards to acute cystitis with hematuria patient reports being treated for UTIs 3 times in the last 8 months-November 2020 treated with Keflex but ultimately did not have UTI on cuture- treated in June at [facility] with keflex and then most recent hospitalization.  This actually may simply be one episode total since no true UTI in last November and most recently when he completed his second course of antibiotics for UTI symptoms recur immediately after stopping antibiotic.  Fortunately patient did not have elevated lactic acid or other indicators for sepsis.  Patient has been scheduled on January 25, 2020 for urinary issues with urology-fortunately he has been able to see them since discharge.  Blood cultures were negative.  Urine culture showed 100,000 colonies of Klebsiella oxytoca pansensitive except ampicillin.  He received 3 days of IV Rocephin and then transitioned to 10-day course of cefdinir 300 mg twice daily.  Urology today did a ultrasound which showed distended bladder-plan for CT next week and asked him to keep me updated on this.   Patient with acute respiratory failure with hypoxia without clear etiology.  Patient reports shortness of breath with exertion that is worsening 5 days prior to admission.  He had recently come back from [place] a few days ago-for fever also did have prolonged travel.  Has a history of heart disease-recent echocardiogram January 20, 2020 with moderate aortic stenosis but otherwise unremarkable.  Cardiac catheterization most recently completed October 2020 with mild to moderate nonobstructive coronary artery disease.  D-dimer was elevated.  Chest CT scan was ordered and showed known right elevated hemidiaphragm.  VQ scan obtained due to history of pulmonary embolisms.  Findings showed filling  defects in exactly the same place as prior known pulmonary embolism so did not get treated for acute PE.  They thought his shortness of breath could be related to chronic pulmonary embolism in addition to heart disease.  Patient required oxygen in the hospital but was able to be discharged home without oxygen.  Patient also had bilateral lower extremity duplex which showed chronic appearing thrombus in left lower side.  Patient had been on Eliquis 2.5 mg twice daily but was transitioned to 5 mg twice daily for DVT/PE indication.  I refilled at this higher level today with plan to chronically continue this.  A/P: Patient is completing antibiotics for complicated UTI and has close urology follow-up.  BPH on exam without prostatitis-continue Flomax.  For acute respiratory failure-may be a combination of chronic PE and underlying heart disease.  We did decide to increase his Eliquis to 5 mg twice daily chronically as reduced dose of 2.5 mg twice daily is primarily for nonvalvular atrial fibrillation.  I did not opt to increase all the way to 10 mg for 7 days as I do believe DVT and PE are chronic but just in case they are contributing to his shortness of breath prefer higher dose of 5 mg twice daily at least for now unless shortness of breath improves  Also please see full discharge summary through care everywhere.  I also want to thank cardiologist Dr. Marlou Porch who was medicated with about Eliquis dosing-he was okay with at least short-term increasing to 5 mg twice daily-I am personally leaning toward continuing this long-term unless he strongly prefers lower dose-does not look like renal adjustments are required for  DVT/PE indication and he does not have atrial fibrillation

## 2020-02-03 ENCOUNTER — Ambulatory Visit (INDEPENDENT_AMBULATORY_CARE_PROVIDER_SITE_OTHER): Payer: Medicare Other | Admitting: Emergency Medicine

## 2020-02-03 ENCOUNTER — Other Ambulatory Visit: Payer: Self-pay

## 2020-02-03 ENCOUNTER — Encounter: Payer: Self-pay | Admitting: Emergency Medicine

## 2020-02-03 VITALS — BP 112/60 | HR 84 | Temp 97.7°F | Ht 75.0 in | Wt 238.6 lb

## 2020-02-03 DIAGNOSIS — I25119 Atherosclerotic heart disease of native coronary artery with unspecified angina pectoris: Secondary | ICD-10-CM | POA: Diagnosis not present

## 2020-02-03 DIAGNOSIS — R0600 Dyspnea, unspecified: Secondary | ICD-10-CM | POA: Insufficient documentation

## 2020-02-03 DIAGNOSIS — R0602 Shortness of breath: Secondary | ICD-10-CM

## 2020-02-03 NOTE — Assessment & Plan Note (Signed)
Multifactorial dyspnea with a history of chronic thromboembolic disease, pulmonary hypertension by echocardiogram that was stable on 01/20/2020 but still present.  He has hemidiaphragmatic elevation which could contribute to the positional nature of his shortness of breath.  He still is able to exercise, rides a bike an hour a day.  Plan for pulmonary function testing.  Given his history of thromboembolic disease, pulmonary hypertension he may need to stay on treatment dose Eliquis.  It was recently increased for only 2 weeks and they are discussing whether to come back down to 2.5 mg twice daily.  I can review with Dr. Yong Channel and Dr. Marlou Porch.

## 2020-02-03 NOTE — Progress Notes (Signed)
Subjective:    Patient ID: [name], male    DOB: 1934/06/06, 86 y.o.   MRN: [id number]  HPI 86 year old man, never smoker with history of CAD, pacemaker, moderate AS (echo 01/20/2020) hypertension, CKD stage IV, renal calculi, PE and chronic lower extremity DVT on low-dose Eliquis, recently increased to full dose.  He has been having frequent urinary tract infections, was admitted recently to [facility] for this, evaluating with urology for distended bladder.  He has an elevated right hemidiaphragm, chronic on imaging.  He is experiencing exertional SOB, happens with climbing stairs. Started to happen a year ago. Dyspnea when he lays down supine. Better when he is upright. No CP, no wheeze. He does exercise some but his routine has decreased some over the last year - he still rides bike an hour a day. No desat w exertion recent hospitalization.   Echocardiogram 01/20/2020 reviewed by me, shows EF 60-65%, moderately elevated RVSP at 46 mmHg.  Review of Systems As per [facility]   Past Medical History:  Diagnosis Date  . Arthritis    "back, knees" (11/21/2016)  . Bladder stones   . CAD (coronary artery disease)    11/20/16 PCI with DES--> LAD/RCA  . Chronic lower back pain   . CKD (chronic kidney disease), stage IV ([place])    [name] 11/08/2016  . History of gout   . History of kidney stones 12/02/2008  . Hyperlipidemia   . Hypertension   . Myocardial infarction [facility])    "silent" (11/21/2016)  . PSA, INCREASED 12/05/2009  . UTI (urinary tract infection)      Family History  Problem Relation Age of Onset  . Breast cancer Mother   . Heart attack Father        49, smoker     Social History   Socioeconomic History  . Marital status: Married    Spouse name: Not on file  . Number of children: Not on file  . Years of education: Not on file  . Highest education level: Not on file  Occupational History  . Not on file  Tobacco Use  . Smoking status: Never Smoker  . Smokeless tobacco:  Never Used  Vaping Use  . Vaping Use: Never used  Substance and Sexual Activity  . Alcohol use: Not Currently    Comment: 11/21/2016 "nothing since 2015"  . Drug use: No  . Sexual activity: Yes    Partners: Female  Other Topics Concern  . Not on file  Social History Narrative   Married (Wife June patient of Dr. Yong Channel). 2 sons. 4 grandkids.    Originally from near [place], [place] at [employer] 6-7 months of the year      Retired from [employer] long time. 27 years of service.       Hobbies: golf   Used to [employer], run [employer]   Social Determinants of Radio broadcast assistant Strain:   . Difficulty of Paying Living Expenses:   Food Insecurity:   . Worried About Charity fundraiser in the Last Year:   . Arboriculturist in the Last Year:   Transportation Needs:   . Film/video editor (Medical):   [name] Lack of Transportation (Non-Medical):   Physical Activity:   . Days of Exercise per Week:   . Minutes of Exercise per Session:   Stress:   . Feeling of Stress :   Social Connections:   . Frequency of Communication with  Friends and Family:   . Frequency of Social Gatherings with Friends and Family:   . Attends Religious Services:   . Active Member of Clubs or Organizations:   . Attends Archivist Meetings:   [name] Marital Status:   Intimate Partner Violence:   . Fear of Current or Ex-Partner:   . Emotionally Abused:   [name] Physically Abused:   . Sexually Abused:     Was in the [employer], was in [place], exposed to agent orange Also worked as a Medical illustrator, Physicist, medical.  Likes to play golf, parachute  Allergies  Allergen Reactions  . Epinephrine Palpitations and Other (See Comments)    "Heart rate races"  . Lisinopril Cough  . Penicillins Hives    Has patient had a PCN reaction causing immediate rash, facial/tongue/throat swelling, SOB or lightheadedness with hypotension: No Has patient had a PCN reaction causing severe rash involving mucus  membranes or skin necrosis: no Has patient had a PCN reaction that required hospitalization: No Has patient had a PCN reaction occurring within the last 10 years: No If all of the above answers are "NO", then may proceed with Cephalosporin use.   . Novocain [Procaine] Palpitations and Other (See Comments)    Makes the heart rate     Outpatient Medications Prior to Visit  Medication Sig Dispense Refill  . allopurinol (ZYLOPRIM) 300 MG tablet TAKE 1 TABLET DAILY 90 tablet 3  . apixaban (ELIQUIS) 5 MG TABS tablet Take 1 tablet (5 mg total) by mouth 2 (two) times daily. (Patient taking differently: Take 2.5 mg by mouth 2 (two) times daily. ) 180 tablet 3  . atorvastatin (LIPITOR) 40 MG tablet TAKE 1 TABLET DAILY 90 tablet 3  . diphenhydramine-acetaminophen (TYLENOL PM) 25-500 MG TABS tablet Take 1 tablet by mouth at bedtime as needed (sleep).    . metoprolol tartrate (LOPRESSOR) 25 MG tablet TAKE 1 TABLET TWICE A DAY (Patient taking differently: Take 25 mg by mouth 2 (two) times daily. ) 180 tablet 3  . nitroGLYCERIN (NITROSTAT) 0.4 MG SL tablet Place 1 tablet (0.4 mg total) under the tongue every 5 (five) minutes as needed for chest pain. 25 tablet 11  . pantoprazole (PROTONIX) 40 MG tablet Take 1 tablet (40 mg total) by mouth daily. 30 tablet 3  . ranolazine (RANEXA) 1000 MG SR tablet Take 1 tablet (1,000 mg total) by mouth 2 (two) times daily. 180 tablet 3  . Tamsulosin HCl (FLOMAX) 0.4 MG CAPS Take 0.4 mg by mouth at bedtime.     . benzonatate (TESSALON) 100 MG capsule Take 1 capsule (100 mg total) by mouth 2 (two) times daily as needed for cough. 20 capsule 0  . fluticasone (FLONASE) 50 MCG/ACT nasal spray Place 2 sprays into both nostrils daily. 16 g 6  . predniSONE (DELTASONE) 20 MG tablet Take 1 tablet (20 mg total) by mouth daily with breakfast. Take two tab for 3 days then one tab for 4 days 10 tablet 0   No facility-administered medications prior to visit.        Objective:    Physical Exam  Vitals:   02/03/20 1034  BP: 112/60  Pulse: 84  Temp: 97.7 F (36.5 C)  TempSrc: Oral  SpO2: 96%  Weight: 238 lb 9.6 oz (108.2 kg)  Height: 6\' 3"  (1.905 m)   Gen: Pleasant, well-nourished, in no distress,  normal affect  ENT: No lesions,  mouth clear,  oropharynx clear, no postnasal drip  Neck: No JVD, no stridor  Lungs: No use of accessory muscles, no crackles or wheezing on normal respiration, no wheeze on forced expiration  Cardiovascular: RRR, 2/6 M, L >> R pretibial edema  Musculoskeletal: No deformities, no cyanosis or clubbing  Neuro: alert, awake, non focal  Skin: Warm, no lesions or rash      Assessment & Plan:  Dyspnea Multifactorial dyspnea with a history of chronic thromboembolic disease, pulmonary hypertension by echocardiogram that was stable on 01/20/2020 but still present.  He has hemidiaphragmatic elevation which could contribute to the positional nature of his shortness of breath.  He still is able to exercise, rides a bike an hour a day.  Plan for pulmonary function testing.  Given his history of thromboembolic disease, pulmonary hypertension he may need to stay on treatment dose Eliquis.  It was recently increased for only 2 weeks and they are discussing whether to come back down to 2.5 mg twice daily.  I can review with Dr. Yong Channel and Dr. Marlou Porch.  Baltazar Apo, MD, PhD 02/03/2020, 5:13 PM [facility] [phone] or if no answer [phone]

## 2020-02-03 NOTE — Patient Instructions (Signed)
We will arrange for pulmonary function testing at your next office visit Continue your exercise regimen as you have been doing it. We confirmed today that your imaging shows an elevated right hemidiaphragm going back to at least 2014 We will consult with Drs. Skains and [place] regarding the dosing of your Eliquis going forward. Follow with Dr. Lamonte Sakai next available with full pulmonary function testing on the same day.

## 2020-02-04 ENCOUNTER — Encounter: Payer: Self-pay | Admitting: Family Medicine

## 2020-02-04 NOTE — Telephone Encounter (Signed)
RB please advise. Thanks.  

## 2020-02-05 DIAGNOSIS — N3020 Other chronic cystitis without hematuria: Secondary | ICD-10-CM | POA: Diagnosis not present

## 2020-02-08 NOTE — Telephone Encounter (Signed)
Dr. Lamonte Sakai please advise on Eliquis dosage for patient?  Please advise

## 2020-02-08 NOTE — Telephone Encounter (Signed)
I'm waiting to hear from Dr Marlou Porch. Will respond after I communicate with him. Thanks.

## 2020-02-09 NOTE — Telephone Encounter (Signed)
I was able to communicate with both Dr. Yong Channel and Dr. Marlou Porch.  We all agree that the patient should stay on Eliquis 5 mg twice a day, full dose.  Please let [name] know.

## 2020-02-09 NOTE — Telephone Encounter (Signed)
Pt has sent a MyChart message to Dr Marlou Porch asking about appropriate dose of Eliquis. He will be contacted as soon as MD has made a decision.

## 2020-02-12 ENCOUNTER — Encounter: Payer: Self-pay | Admitting: Family Medicine

## 2020-02-12 ENCOUNTER — Other Ambulatory Visit: Payer: Self-pay

## 2020-02-12 MED ORDER — APIXABAN 5 MG PO TABS: 5.0000 mg | ORAL_TABLET | Freq: Two times a day (BID) | ORAL | 3 refills | Status: DC

## 2020-03-04 DIAGNOSIS — N401 Enlarged prostate with lower urinary tract symptoms: Secondary | ICD-10-CM | POA: Diagnosis not present

## 2020-03-04 DIAGNOSIS — R3911 Hesitancy of micturition: Secondary | ICD-10-CM | POA: Diagnosis not present

## 2020-03-04 DIAGNOSIS — R3912 Poor urinary stream: Secondary | ICD-10-CM | POA: Diagnosis not present

## 2020-03-04 DIAGNOSIS — R351 Nocturia: Secondary | ICD-10-CM | POA: Diagnosis not present

## 2020-03-21 ENCOUNTER — Ambulatory Visit: Payer: Medicare Other | Admitting: Emergency Medicine

## 2020-03-28 DIAGNOSIS — H43811 Vitreous degeneration, right eye: Secondary | ICD-10-CM | POA: Diagnosis not present

## 2020-03-29 ENCOUNTER — Encounter: Payer: Self-pay | Admitting: Emergency Medicine

## 2020-03-29 ENCOUNTER — Other Ambulatory Visit: Payer: Self-pay

## 2020-03-29 ENCOUNTER — Ambulatory Visit (INDEPENDENT_AMBULATORY_CARE_PROVIDER_SITE_OTHER): Payer: Medicare Other | Admitting: Emergency Medicine

## 2020-03-29 DIAGNOSIS — R0602 Shortness of breath: Secondary | ICD-10-CM

## 2020-03-29 DIAGNOSIS — I25119 Atherosclerotic heart disease of native coronary artery with unspecified angina pectoris: Secondary | ICD-10-CM

## 2020-03-29 LAB — PULMONARY FUNCTION TEST
DL/VA % pred: 100 %
DL/VA: 3.82 ml/min/mmHg/L
DLCO cor % pred: 66 %
DLCO cor: 16.92 ml/min/mmHg
DLCO unc % pred: 64 %
DLCO unc: 16.37 ml/min/mmHg
FEF 25-75 Post: 1.72 L/sec
FEF 25-75 Pre: 1.02 L/sec
FEF2575-%Change-Post: 67 %
FEF2575-%Pred-Post: 87 %
FEF2575-%Pred-Pre: 52 %
FEV1-%Change-Post: 12 %
FEV1-%Pred-Post: 62 %
FEV1-%Pred-Pre: 55 %
FEV1-Post: 1.86 L
FEV1-Pre: 1.66 L
FEV1FVC-%Change-Post: 10 %
FEV1FVC-%Pred-Pre: 100 %
FEV6-%Change-Post: 2 %
FEV6-%Pred-Post: 60 %
FEV6-%Pred-Pre: 58 %
FEV6-Post: 2.35 L
FEV6-Pre: 2.29 L
FEV6FVC-%Change-Post: 0 %
FEV6FVC-%Pred-Post: 106 %
FEV6FVC-%Pred-Pre: 105 %
FVC-%Change-Post: 2 %
FVC-%Pred-Post: 56 %
FVC-%Pred-Pre: 55 %
FVC-Post: 2.37 L
FVC-Pre: 2.32 L
Post FEV1/FVC ratio: 79 %
Post FEV6/FVC ratio: 99 %
Pre FEV1/FVC ratio: 71 %
Pre FEV6/FVC Ratio: 99 %
RV % pred: 97 %
RV: 2.78 L
TLC % pred: 71 %
TLC: 5.38 L

## 2020-03-29 MED ORDER — ALBUTEROL SULFATE HFA 108 (90 BASE) MCG/ACT IN AERS
2.0000 | INHALATION_SPRAY | Freq: Four times a day (QID) | RESPIRATORY_TRACT | 6 refills | Status: DC | PRN
Start: 2020-03-29 — End: 2020-11-30

## 2020-03-29 NOTE — Addendum Note (Signed)
Addended by: Gavin Potters R on: 03/29/2020 02:59 PM   Modules accepted: Orders

## 2020-03-29 NOTE — Assessment & Plan Note (Signed)
Multifactorial dyspnea with known aortic stenosis and coronary disease, elevated right hemidiaphragm, history of VTE on Eliquis.  We have uncovered obstructive lung disease, probably mild underlying asthma.  I think it is reasonable for him to do a trial of albuterol to see if he gets benefit with exertion.  He will keep track of how his symptoms respond.  He knows that he will also benefit from weight loss, increase cardiopulmonary conditioning.  I will follow with him in 6 months.  Continue your Eliquis as you currently taking it. We will try albuterol 2 puffs about 15 minutes prior to exercise to see if you get benefit.  You could also take this if needed in response to shortness of breath, chest tightness, wheezing. Keep track of how the albuterol helps you and we can discuss next time. Follow with Dr Lamonte Sakai in 6 months or sooner if you have any problems

## 2020-03-29 NOTE — Progress Notes (Signed)
° °  Subjective:    Patient ID: [name], male    DOB: [birth year], 86 y.o.   MRN: [id number]  HPI 86 year old man, never smoker with history of CAD, pacemaker, moderate AS (echo 01/20/2020) hypertension, CKD stage IV, renal calculi, PE and chronic lower extremity DVT on low-dose Eliquis, recently increased to full dose.  He has been having frequent urinary tract infections, was admitted recently to [facility] for this, evaluating with urology for distended bladder.  He has an elevated right hemidiaphragm, chronic on imaging.  He is experiencing exertional SOB, happens with climbing stairs. Started to happen a year ago. Dyspnea when he lays down supine. Better when he is upright. No CP, no wheeze. He does exercise some but his routine has decreased some over the last year - he still rides bike an hour a day. No desat w exertion recent hospitalization.   Echocardiogram 01/20/2020 reviewed by me, shows EF 60-65%, moderately elevated RVSP at 46 mmHg.  ROV 03/29/20 --this is a follow-up visit for 86 year old never smoker with moderate AS, hypertension, CAD, CKD stage IV, history of chronic lower extremity DVT and PE (Eliquis), UTIs, elevated right hemidiaphragm.  He has had unexplained likely multifactorial shortness of breath.  He underwent pulmonary function testing today which I have reviewed.  This shows evidence for mixed obstructive and restrictive disease on spirometry with a positive bronchodilator response, restricted lung volumes and a decreased diffusion capacity that corrects to normal range when adjusted for alveolar volume.   Review of Systems As per HPi      Objective:   Physical Exam  Vitals:   03/29/20 1407  BP: 118/70  Pulse: 80  Temp: 98 F (36.7 C)  TempSrc: Oral  SpO2: 97%  Weight: 242 lb (109.8 kg)  Height: 6' (1.829 m)   Gen: Pleasant, well-nourished, in no distress,  normal affect  ENT: No lesions,  mouth clear,  oropharynx clear, no postnasal drip  Neck: No JVD, no  stridor  Lungs: No use of accessory muscles, no crackles or wheezing on normal respiration, no wheeze on forced expiration  Cardiovascular: RRR, 2/6 M, no edema  Musculoskeletal: No deformities, no cyanosis or clubbing  Neuro: alert, awake, non focal  Skin: Warm, no lesions or rash      Assessment & Plan:  Dyspnea Multifactorial dyspnea with known aortic stenosis and coronary disease, elevated right hemidiaphragm, history of VTE on Eliquis.  We have uncovered obstructive lung disease, probably mild underlying asthma.  I think it is reasonable for him to do a trial of albuterol to see if he gets benefit with exertion.  He will keep track of how his symptoms respond.  He knows that he will also benefit from weight loss, increase cardiopulmonary conditioning.  I will follow with him in 6 months.  Continue your Eliquis as you currently taking it. We will try albuterol 2 puffs about 15 minutes prior to exercise to see if you get benefit.  You could also take this if needed in response to shortness of breath, chest tightness, wheezing. Keep track of how the albuterol helps you and we can discuss next time. Follow with Dr Lamonte Sakai in 6 months or sooner if you have any problems  Baltazar Apo, MD, PhD 03/29/2020, 2:30 PM [facility] [phone] or if no answer [phone]

## 2020-03-29 NOTE — Progress Notes (Signed)
PFT done today. 

## 2020-03-29 NOTE — Patient Instructions (Signed)
Continue your Eliquis as you currently taking it. We will try albuterol 2 puffs about 15 minutes prior to exercise to see if you get benefit.  You could also take this if needed in response to shortness of breath, chest tightness, wheezing. Keep track of how the albuterol helps you and we can discuss next time. Follow with Dr Lamonte Sakai in 6 months or sooner if you have any problems

## 2020-04-06 ENCOUNTER — Ambulatory Visit (INDEPENDENT_AMBULATORY_CARE_PROVIDER_SITE_OTHER): Payer: Medicare Other | Admitting: *Deleted

## 2020-04-06 DIAGNOSIS — I495 Sick sinus syndrome: Secondary | ICD-10-CM | POA: Diagnosis not present

## 2020-04-07 LAB — CUP PACEART REMOTE DEVICE CHECK
Battery Remaining Longevity: 99 mo
Battery Voltage: 2.94 V
Brady Statistic AP VP Percent: 4.53 %
Brady Statistic AP VS Percent: 93.02 %
Brady Statistic AS VP Percent: 0.02 %
Brady Statistic AS VS Percent: 2.43 %
Brady Statistic RA Percent Paced: 98.37 %
Brady Statistic RV Percent Paced: 4.55 %
Date Time Interrogation Session: 20210826020630
Implantable Lead Implant Date: 20190116
Implantable Lead Implant Date: 20190116
Implantable Lead Location: 753859
Implantable Lead Location: 753860
Implantable Lead Model: 5076
Implantable Lead Model: 5076
Implantable Pulse Generator Implant Date: 20190116
Lead Channel Impedance Value: 285 Ohm
Lead Channel Impedance Value: 304 Ohm
Lead Channel Impedance Value: 342 Ohm
Lead Channel Impedance Value: 437 Ohm
Lead Channel Pacing Threshold Amplitude: 0.875 V
Lead Channel Pacing Threshold Amplitude: 1 V
Lead Channel Pacing Threshold Pulse Width: 0.4 ms
Lead Channel Pacing Threshold Pulse Width: 0.4 ms
Lead Channel Sensing Intrinsic Amplitude: 0.375 mV
Lead Channel Sensing Intrinsic Amplitude: 0.375 mV
Lead Channel Sensing Intrinsic Amplitude: 6.5 mV
Lead Channel Sensing Intrinsic Amplitude: 6.5 mV
Lead Channel Setting Pacing Amplitude: 2 V
Lead Channel Setting Pacing Amplitude: 2.75 V
Lead Channel Setting Pacing Pulse Width: 0.4 ms
Lead Channel Setting Sensing Sensitivity: 1.2 mV

## 2020-04-08 DIAGNOSIS — Z23 Encounter for immunization: Secondary | ICD-10-CM | POA: Diagnosis not present

## 2020-04-10 ENCOUNTER — Encounter: Payer: Self-pay | Admitting: Family Medicine

## 2020-04-11 ENCOUNTER — Other Ambulatory Visit: Payer: Self-pay | Admitting: Cardiology

## 2020-04-11 NOTE — Progress Notes (Signed)
Remote pacemaker transmission.   

## 2020-04-18 ENCOUNTER — Other Ambulatory Visit: Payer: Self-pay | Admitting: Nurse Practitioner

## 2020-05-12 DIAGNOSIS — Z23 Encounter for immunization: Secondary | ICD-10-CM | POA: Diagnosis not present

## 2020-05-13 ENCOUNTER — Encounter: Payer: Self-pay | Admitting: Family Medicine

## 2020-05-24 DIAGNOSIS — Z03818 Encounter for observation for suspected exposure to other biological agents ruled out: Secondary | ICD-10-CM | POA: Diagnosis not present

## 2020-05-24 DIAGNOSIS — Z20822 Contact with and (suspected) exposure to covid-19: Secondary | ICD-10-CM | POA: Diagnosis not present

## 2020-06-02 ENCOUNTER — Encounter: Payer: Self-pay | Admitting: Cardiology

## 2020-06-02 ENCOUNTER — Other Ambulatory Visit: Payer: Self-pay

## 2020-06-02 ENCOUNTER — Ambulatory Visit (INDEPENDENT_AMBULATORY_CARE_PROVIDER_SITE_OTHER): Payer: Medicare Other | Admitting: Cardiology

## 2020-06-02 VITALS — BP 118/80 | HR 65 | Ht 72.0 in | Wt 244.4 lb

## 2020-06-02 DIAGNOSIS — I25119 Atherosclerotic heart disease of native coronary artery with unspecified angina pectoris: Secondary | ICD-10-CM

## 2020-06-02 DIAGNOSIS — I77810 Thoracic aortic ectasia: Secondary | ICD-10-CM | POA: Diagnosis not present

## 2020-06-02 DIAGNOSIS — R0602 Shortness of breath: Secondary | ICD-10-CM | POA: Diagnosis not present

## 2020-06-02 DIAGNOSIS — Z86711 Personal history of pulmonary embolism: Secondary | ICD-10-CM | POA: Diagnosis not present

## 2020-06-02 NOTE — Patient Instructions (Signed)

## 2020-06-02 NOTE — Progress Notes (Signed)
Cardiology Office Note:    Date:  06/02/2020   ID:  [name], DOB 05-22-1934, MRN [id number]  PCP:  Marin Olp, MD  [facility] Cardiologist:  [name], MD  [facility] Electrophysiologist:  Will Meredith Leeds, MD   Referring MD: Marin Olp, MD     History of Present Illness:    [name] is a 86 y.o. male with here for follow up of CAD.  Had heavy disease in his RCA and LAD and underwent atherectomy and then after the procedure continue to have shortness of breath he then underwent a PE CT that showed subsegmental PEs in his left lower lobe.  Fairly small clot burden.  DVTs were also noted but bilateral cannulization was noted.  Eliquis was started.  He was continued on Plavix at that time.  He also has seen Dr. Josem Kaufmann in [place].  Had pacemaker implanted there in [place].  Dr. Curt Bears follows him.  He is retired Designer, multimedia, wife's name is June.  Had trouble tolerating isosorbide in the past for angina.  He did have fairly significant constipation with Ranexa.  Pacer--SSS.   Overall feels quite well.  Still battles with lower extremity edema.   Past Medical History:  Diagnosis Date  . Arthritis    "back, knees" (11/21/2016)  . Bladder stones   . CAD (coronary artery disease)    11/20/16 PCI with DES--> LAD/RCA  . Chronic lower back pain   . CKD (chronic kidney disease), stage IV ([place])    [name] 11/08/2016  . History of gout   . History of kidney stones 12/02/2008  . Hyperlipidemia   . Hypertension   . Myocardial infarction [facility])    "silent" (11/21/2016)  . PSA, INCREASED 12/05/2009  . UTI (urinary tract infection)     Past Surgical History:  Procedure Laterality Date  . CARDIAC CATHETERIZATION  11/14/2016  . CATARACT EXTRACTION W/ INTRAOCULAR LENS  IMPLANT, BILATERAL    . CORONARY ANGIOPLASTY WITH STENT PLACEMENT  11/21/2016   "2 stents"   . CORONARY ATHERECTOMY N/A 11/21/2016   Procedure: Coronary Atherectomy;   Surgeon: Leonie Man, MD;  Location: [facility];  Service: Cardiovascular;  Laterality: N/A;  RCA and LAD  . CORONARY STENT INTERVENTION N/A 11/21/2016   Procedure: Coronary Stent Intervention;  Surgeon: Leonie Man, MD;  Location: [facility];  Service: Cardiovascular;  Laterality: N/A;  RCA and LAD  . CYSTOSCOPY W/ STONE MANIPULATION    . INTRAVASCULAR PRESSURE WIRE/FFR STUDY N/A 05/22/2019   Procedure: INTRAVASCULAR PRESSURE WIRE/FFR STUDY;  Surgeon: Nelva Bush, MD;  Location: [place] [facility];  Service: Cardiovascular;  Laterality: N/A;  . JOINT REPLACEMENT    . KNEE ARTHROSCOPY Left   . LAPAROSCOPIC CHOLECYSTECTOMY    . LEFT HEART CATH N/A 11/21/2016   Procedure: Left Heart Cath;  Surgeon: Leonie Man, MD;  Location: [place] [facility];  Service: Cardiovascular;  Laterality: N/A;  . LITHOTRIPSY    . PILONIDAL CYST EXCISION  56  . RIGHT/LEFT HEART CATH AND CORONARY ANGIOGRAPHY N/A 11/14/2016   Procedure: Right/Left Heart Cath and Coronary Angiography;  Surgeon: Leonie Man, MD;  Location: [place] [facility];  Service: Cardiovascular;  Laterality: N/A;  . RIGHT/LEFT HEART CATH AND CORONARY ANGIOGRAPHY N/A 05/22/2019   Procedure: RIGHT/LEFT HEART CATH AND CORONARY ANGIOGRAPHY;  Surgeon: Nelva Bush, MD;  Location: [facility];  Service: Cardiovascular;  Laterality: N/A;  . TEMPORARY PACEMAKER N/A 11/21/2016  Procedure: Temporary Pacemaker;  Surgeon: Leonie Man, MD;  Location: [place] [facility];  Service: Cardiovascular;  Laterality: N/A;  . TONSILLECTOMY    . TOTAL KNEE ARTHROPLASTY Left 09/22/2012   Procedure: TOTAL KNEE ARTHROPLASTY;  Surgeon: Lorn Junes, MD;  Location: [facility];  Service: Orthopedics;  Laterality: Left;    Current Medications: Current Meds  Medication Sig  . albuterol (VENTOLIN HFA) 108 (90 Base) MCG/ACT inhaler Inhale 2 puffs into the lungs every 6 (six) hours as needed for wheezing or shortness of breath.  .  allopurinol (ZYLOPRIM) 300 MG tablet TAKE 1 TABLET DAILY  . apixaban (ELIQUIS) 5 MG TABS tablet Take 1 tablet (5 mg total) by mouth 2 (two) times daily.  [name] atorvastatin (LIPITOR) 40 MG tablet TAKE 1 TABLET DAILY  . diphenhydramine-acetaminophen (TYLENOL PM) 25-500 MG TABS tablet Take 1 tablet by mouth at bedtime as needed (sleep).  . metoprolol tartrate (LOPRESSOR) 25 MG tablet Take 1 tablet (25 mg total) by mouth 2 (two) times daily. Please make overdue appt with Dr. Curt Bears before anymore refills. 1st attempt  . nitroGLYCERIN (NITROSTAT) 0.4 MG SL tablet Place 1 tablet (0.4 mg total) under the tongue every 5 (five) minutes as needed for chest pain.  . pantoprazole (PROTONIX) 40 MG tablet Take 1 tablet (40 mg total) by mouth daily.  . ranolazine (RANEXA) 1000 MG SR tablet TAKE 1 TABLET TWICE A DAY  . Tamsulosin HCl (FLOMAX) 0.4 MG CAPS Take 0.4 mg by mouth at bedtime.      Allergies:   Epinephrine, Lisinopril, Penicillins, and Novocain [procaine]   Social History   Socioeconomic History  . Marital status: Married    Spouse name: Not on file  . Number of children: Not on file  . Years of education: Not on file  . Highest education level: Not on file  Occupational History  . Not on file  Tobacco Use  . Smoking status: Never Smoker  . Smokeless tobacco: Never Used  Vaping Use  . Vaping Use: Never used  Substance and Sexual Activity  . Alcohol use: Not Currently    Comment: 11/21/2016 "nothing since 2015"  . Drug use: No  . Sexual activity: Yes    Partners: Female  Other Topics Concern  . Not on file  Social History Narrative   Married (Wife June patient of Dr. Yong Channel). 2 sons. 4 grandkids.    Originally from near [place], [place] at [employer] 6-7 months of the year      Retired from [employer] long time. 27 years of service.       Hobbies: golf   Used to [employer], run [employer]   Social Determinants of Radio broadcast assistant Strain:   .  Difficulty of Paying Living Expenses: Not on file  Food Insecurity:   . Worried About Charity fundraiser in the Last Year: Not on file  . Ran Out of Food in the Last Year: Not on file  Transportation Needs:   . Lack of Transportation (Medical): Not on file  . Lack of Transportation (Non-Medical): Not on file  Physical Activity:   . Days of Exercise per Week: Not on file  . Minutes of Exercise per Session: Not on file  Stress:   . Feeling of Stress : Not on file  Social Connections:   . Frequency of Communication with Friends and Family: Not on file  . Frequency of Social Gatherings with Friends and Family: Not on  file  . Attends Religious Services: Not on file  . Active Member of Clubs or Organizations: Not on file  . Attends Archivist Meetings: Not on file  . Marital Status: Not on file     Family History: The patient's family history includes Breast cancer in his mother; Heart attack in his father.  ROS:   Please see the history of present illness.     All other systems reviewed and are negative.  EKGs/Labs/Other Studies Reviewed:      EKG:  EKG is  ordered today.  The ekg ordered today demonstrates atrial pacing, ventricular sensing.   Recent Labs: 01/29/2020: ALT 24; BUN 39; Creatinine, Ser 1.79; Hemoglobin 13.5; Platelets 208.0; Potassium 4.1; Sodium 136  Recent Lipid Panel    Component Value Date/Time   CHOL 123 01/21/2019 0956   TRIG 116.0 01/21/2019 0956   HDL 34.20 (L) 01/21/2019 0956   CHOLHDL 4 01/21/2019 0956   VLDL 23.2 01/21/2019 0956   LDLCALC 66 01/21/2019 0956   LDLDIRECT 79.0 12/08/2014 0905      Physical Exam:    VS:  BP 118/80   Pulse 65   Ht 6' (1.829 m)   Wt 244 lb 6.4 oz (110.9 kg)   SpO2 95%   BMI 33.15 kg/m     Wt Readings from Last 3 Encounters:  06/02/20 244 lb 6.4 oz (110.9 kg)  03/29/20 242 lb (109.8 kg)  02/03/20 238 lb 9.6 oz (108.2 kg)     GEN:  Well nourished, well developed in no acute distress HEENT:  Normal NECK: No JVD; No carotid bruits LYMPHATICS: No lymphadenopathy CARDIAC: RRR, no murmurs, rubs, gallops, pacemaker in place RESPIRATORY:  Clear to auscultation without rales, wheezing or rhonchi  ABDOMEN: Soft, non-tender, non-distended MUSCULOSKELETAL: Significant lower extremity edema, compression socks; No deformity  SKIN: Warm and dry NEUROLOGIC:  Alert and oriented x 3 PSYCHIATRIC:  Normal affect   ASSESSMENT:    1. Dilated aortic root (HCC)   2. Coronary artery disease involving native coronary artery of native heart with angina pectoris ([place])   3. SOB (shortness of breath)   4. History of pulmonary embolus (PE)    PLAN:    In order of problems listed above:  Coronary artery disease -Overall stable.  Chronic controlled anginal symptoms with Ranexa.  No change in medical management.  Essential hypertension -Well-controlled on current medications.  No changes made.  118/80 today  Dilated aortic root -Continuing to monitor with echocardiograms.  Mildly dilated.  No changes made.  Continue to treat blood pressure.  Moderate aortic stenosis -EF 60-65%. Moderate AS 51mmHg mean PG, 30mmHg peak  PG. When compared to prior, mild increase in aortic stenosis. Repeat ECHO  in one year.   Dyspnea -He has been seeing Dr. Lamonte Sakai.  Last note reviewed from 03/29/2020.  PFT shows mixed obstructive and restrictive disease.  Has an elevated right hemidiaphragm.  Fairly unexplained multifactoral shortness of breath.  He did uncover obstructive lung disease probably mild asthma.  Albuterol utilized.  Prior PE/DVT -Continuing with Eliquis 5 mg twice a day.  No bleeding.  Hemoglobin 13.5 creatinine 1.79   Medication Adjustments/Labs and Tests Ordered: Current medicines are reviewed at length with the patient today.  Concerns regarding medicines are outlined above.  Orders Placed This Encounter  Procedures  . EKG 12-Lead   No orders of the defined types were placed in this  encounter.   Patient Instructions  Medication Instructions:  The current medical regimen is effective;  continue present plan and medications.  *If you need a refill on your cardiac medications before your next appointment, please call your pharmacy*  Follow-Up: At [facility], you and your health needs are our priority.  As part of our continuing mission to provide you with exceptional heart care, we have created designated Provider Care Teams.  These Care Teams include your primary Cardiologist (physician) and Advanced Practice Providers (APPs -  Physician Assistants and Nurse Practitioners) who all work together to provide you with the care you need, when you need it.  We recommend signing up for the patient portal called "MyChart".  Sign up information is provided on this After Visit Summary.  MyChart is used to connect with patients for Virtual Visits (Telemedicine).  Patients are able to view lab/test results, encounter notes, upcoming appointments, etc.  Non-urgent messages can be sent to your provider as well.   To learn more about what you can do with MyChart, go to [email or web address].    Your next appointment:   6 month(s)  The format for your next appointment:   In Person  Provider:   [name], MD   Thank you for choosing [facility]!!        Signed, [name], MD  06/02/2020 1:58 PM    [place]

## 2020-06-06 DIAGNOSIS — R3914 Feeling of incomplete bladder emptying: Secondary | ICD-10-CM | POA: Diagnosis not present

## 2020-06-06 DIAGNOSIS — N401 Enlarged prostate with lower urinary tract symptoms: Secondary | ICD-10-CM | POA: Diagnosis not present

## 2020-06-14 ENCOUNTER — Encounter: Payer: Self-pay | Admitting: Family Medicine

## 2020-06-14 ENCOUNTER — Telehealth: Payer: Self-pay | Admitting: *Deleted

## 2020-06-14 ENCOUNTER — Other Ambulatory Visit: Payer: Self-pay

## 2020-06-14 MED ORDER — APIXABAN 5 MG PO TABS: 5.0000 mg | ORAL_TABLET | Freq: Two times a day (BID) | ORAL | 0 refills | Status: DC

## 2020-06-14 MED ORDER — METOPROLOL TARTRATE 25 MG PO TABS: 25.0000 mg | ORAL_TABLET | Freq: Two times a day (BID) | ORAL | 0 refills | Status: DC

## 2020-06-14 NOTE — Telephone Encounter (Signed)
Doctor Marlou Porch I am out of eliquis. New prescription will arrive on Thursday. Can I survive for two days without? If not can you end a prescription for a 7-day supply to [facility]. Sorry for the inconvenience Daxx Tiggs [phone] Feb 29, 1934   Eliquis 5 mg BID #[id number] sent into pharmacy if choice to cover until pt's mail order is delivered.

## 2020-06-15 ENCOUNTER — Other Ambulatory Visit: Payer: Self-pay | Admitting: Cardiology

## 2020-06-15 NOTE — Telephone Encounter (Signed)
Spoke with Pharmacy in [place] and the pt already picked up the 7 day supply of eliquis so this is a duplicate. Will deny at this time since pt is waiting on Mail order.

## 2020-06-22 ENCOUNTER — Other Ambulatory Visit: Payer: Self-pay

## 2020-06-22 MED ORDER — FLUTICASONE PROPIONATE 50 MCG/ACT NA SUSP: 2.0000 | Freq: Every day | NASAL | 6 refills | Status: DC

## 2020-07-06 ENCOUNTER — Ambulatory Visit (INDEPENDENT_AMBULATORY_CARE_PROVIDER_SITE_OTHER): Payer: Medicare Other

## 2020-07-06 DIAGNOSIS — I495 Sick sinus syndrome: Secondary | ICD-10-CM

## 2020-07-08 LAB — CUP PACEART REMOTE DEVICE CHECK
Battery Remaining Longevity: 108 mo
Battery Voltage: 2.94 V
Brady Statistic AP VP Percent: 13.19 %
Brady Statistic AP VS Percent: 85.79 %
Brady Statistic AS VP Percent: 0.02 %
Brady Statistic AS VS Percent: 1 %
Brady Statistic RA Percent Paced: 99.07 %
Brady Statistic RV Percent Paced: 13.21 %
Date Time Interrogation Session: 20211126095422
Implantable Lead Implant Date: 20190116
Implantable Lead Implant Date: 20190116
Implantable Lead Location: 753859
Implantable Lead Location: 753860
Implantable Lead Model: 5076
Implantable Lead Model: 5076
Implantable Pulse Generator Implant Date: 20190116
Lead Channel Impedance Value: 285 Ohm
Lead Channel Impedance Value: 304 Ohm
Lead Channel Impedance Value: 342 Ohm
Lead Channel Impedance Value: 437 Ohm
Lead Channel Pacing Threshold Amplitude: 0.75 V
Lead Channel Pacing Threshold Amplitude: 1 V
Lead Channel Pacing Threshold Pulse Width: 0.4 ms
Lead Channel Pacing Threshold Pulse Width: 0.4 ms
Lead Channel Sensing Intrinsic Amplitude: 0.25 mV
Lead Channel Sensing Intrinsic Amplitude: 0.25 mV
Lead Channel Sensing Intrinsic Amplitude: 6.5 mV
Lead Channel Sensing Intrinsic Amplitude: 6.5 mV
Lead Channel Setting Pacing Amplitude: 2 V
Lead Channel Setting Pacing Amplitude: 2.25 V
Lead Channel Setting Pacing Pulse Width: 0.4 ms
Lead Channel Setting Sensing Sensitivity: 1.2 mV

## 2020-07-15 NOTE — Progress Notes (Signed)
Remote pacemaker transmission.   

## 2020-09-11 ENCOUNTER — Other Ambulatory Visit: Payer: Self-pay | Admitting: Cardiology

## 2020-09-12 ENCOUNTER — Telehealth: Payer: Self-pay | Admitting: Cardiology

## 2020-09-12 MED ORDER — RANOLAZINE ER 1000 MG PO TB12
1000.0000 mg | ORAL_TABLET | Freq: Two times a day (BID) | ORAL | 0 refills | Status: DC
Start: 2020-09-12 — End: 2021-03-13

## 2020-09-12 NOTE — Telephone Encounter (Signed)
Pt's medication was sent to pt's local pharmacy as requested. Confirmation received.

## 2020-09-12 NOTE — Telephone Encounter (Signed)
°*  STAT* If patient is at the pharmacy, call can be transferred to refill team.   1. Which medications need to be refilled? (please list name of each medication and dose if known) ranolazine (RANEXA) 1000 MG SR tablet  2. Which pharmacy/location (including street and city if local pharmacy) is medication to be sent to? [place]  3. Do they need a 30 day or 90 day supply? 30 day   Patient is out of medication

## 2020-09-19 MED ORDER — METOPROLOL TARTRATE 25 MG PO TABS
25.0000 mg | ORAL_TABLET | Freq: Two times a day (BID) | ORAL | 0 refills | Status: DC
Start: 2020-09-19 — End: 2020-09-21

## 2020-09-21 ENCOUNTER — Other Ambulatory Visit: Payer: Self-pay

## 2020-09-21 ENCOUNTER — Ambulatory Visit (INDEPENDENT_AMBULATORY_CARE_PROVIDER_SITE_OTHER): Payer: Medicare Other | Admitting: Physician Assistant

## 2020-09-21 ENCOUNTER — Encounter: Payer: Self-pay | Admitting: Physician Assistant

## 2020-09-21 VITALS — BP 110/70 | HR 76 | Ht 72.0 in | Wt 249.0 lb

## 2020-09-21 DIAGNOSIS — I10 Essential (primary) hypertension: Secondary | ICD-10-CM | POA: Diagnosis not present

## 2020-09-21 DIAGNOSIS — I472 Ventricular tachycardia: Secondary | ICD-10-CM

## 2020-09-21 DIAGNOSIS — Z950 Presence of cardiac pacemaker: Secondary | ICD-10-CM

## 2020-09-21 DIAGNOSIS — I495 Sick sinus syndrome: Secondary | ICD-10-CM | POA: Diagnosis not present

## 2020-09-21 DIAGNOSIS — I480 Paroxysmal atrial fibrillation: Secondary | ICD-10-CM

## 2020-09-21 DIAGNOSIS — I2510 Atherosclerotic heart disease of native coronary artery without angina pectoris: Secondary | ICD-10-CM

## 2020-09-21 DIAGNOSIS — I4729 Other ventricular tachycardia: Secondary | ICD-10-CM

## 2020-09-21 LAB — CUP PACEART INCLINIC DEVICE CHECK
Battery Remaining Longevity: 79 mo
Battery Voltage: 2.92 V
Brady Statistic AP VP Percent: 5.68 %
Brady Statistic AP VS Percent: 90.45 %
Brady Statistic AS VP Percent: 0.09 %
Brady Statistic AS VS Percent: 3.78 %
Brady Statistic RA Percent Paced: 96.24 %
Brady Statistic RV Percent Paced: 5.77 %
Date Time Interrogation Session: 20220209133812
Implantable Lead Implant Date: 20190116
Implantable Lead Implant Date: 20190116
Implantable Lead Location: 753859
Implantable Lead Location: 753860
Implantable Lead Model: 5076
Implantable Lead Model: 5076
Implantable Pulse Generator Implant Date: 20190116
Lead Channel Impedance Value: 304 Ohm
Lead Channel Impedance Value: 342 Ohm
Lead Channel Impedance Value: 380 Ohm
Lead Channel Impedance Value: 475 Ohm
Lead Channel Pacing Threshold Amplitude: 1 V
Lead Channel Pacing Threshold Amplitude: 1.625 V
Lead Channel Pacing Threshold Pulse Width: 0.4 ms
Lead Channel Pacing Threshold Pulse Width: 0.4 ms
Lead Channel Sensing Intrinsic Amplitude: 0.375 mV
Lead Channel Sensing Intrinsic Amplitude: 0.625 mV
Lead Channel Sensing Intrinsic Amplitude: 7.875 mV
Lead Channel Sensing Intrinsic Amplitude: 8.5 mV
Lead Channel Setting Pacing Amplitude: 2 V
Lead Channel Setting Pacing Amplitude: 3.5 V
Lead Channel Setting Pacing Pulse Width: 0.4 ms
Lead Channel Setting Sensing Sensitivity: 1.2 mV

## 2020-09-21 MED ORDER — METOPROLOL TARTRATE 25 MG PO TABS
25.0000 mg | ORAL_TABLET | Freq: Two times a day (BID) | ORAL | 3 refills | Status: DC
Start: 2020-09-21 — End: 2021-02-20

## 2020-09-21 NOTE — Progress Notes (Signed)
Cardiology Office Note Date:  09/21/2020  Patient ID:  [name], [name] 1933-08-26, MRN [id number] PCP:  Marin Olp, MD  Cardiologist:  Dr. Marlou Porch Electrophysiologist: Dr. Curt Bears   Chief Complaint:  over due EP  History of Present Illness: [name] is a 87 y.o. male with history of CAD (PCI with drug-eluting stents to the RCA and LAD 11/20/16), CKD (IV), renal calculi, HTN, HLD, SSSx w/PPM, dilated aortic root, NSVT, PEs, DVT  He comes today to be seen for Dr. Curt Bears, last seen by him back in 2019, he was doing well, device interrogation noted NSVT and started on BB. This was his last in clnic EP viist  More recently he was seen by Dr. Marlou Porch 06/02/20.  Planned to monitor root via echos annually.  Discussed ongoing SOB, following with pulmonary had PFT shows mixed obstructive and restrictive disease.  Has an elevated right hemidiaphragm.  Fairly unexplained multifactoral shortness of breath.  He did uncover obstructive lung disease probably mild asthma.  Albuterol utilized. No changes were made.  TODAY He continues to do well. No CP, palpitations or cardiac awareness. He has baseline SOB unchanged for years. No dizzy spells, near syncope or syncope. No bleeding or signs of bleeding  His PMD monitors and manages his lipids.  Discussed importance of annual EP clinic visits Remotes are up to date  Device information MDT dual chamber PPM implanted 08/28/2017   Past Medical History:  Diagnosis Date  . Arthritis    "back, knees" (11/21/2016)  . Bladder stones   . CAD (coronary artery disease)    11/20/16 PCI with DES--> LAD/RCA  . Chronic lower back pain   . CKD (chronic kidney disease), stage IV ([place])    [name] 11/08/2016  . History of gout   . History of kidney stones 12/02/2008  . Hyperlipidemia   . Hypertension   . Myocardial infarction [facility])    "silent" (11/21/2016)  . PSA, INCREASED 12/05/2009  . UTI (urinary tract infection)     Past Surgical  History:  Procedure Laterality Date  . CARDIAC CATHETERIZATION  11/14/2016  . CATARACT EXTRACTION W/ INTRAOCULAR LENS  IMPLANT, BILATERAL    . CORONARY ANGIOPLASTY WITH STENT PLACEMENT  11/21/2016   "2 stents"   . CORONARY ATHERECTOMY N/A 11/21/2016   Procedure: Coronary Atherectomy;  Surgeon: Leonie Man, MD;  Location: [place] [facility];  Service: Cardiovascular;  Laterality: N/A;  RCA and LAD  . CORONARY STENT INTERVENTION N/A 11/21/2016   Procedure: Coronary Stent Intervention;  Surgeon: Leonie Man, MD;  Location: [place] [facility];  Service: Cardiovascular;  Laterality: N/A;  RCA and LAD  . CYSTOSCOPY W/ STONE MANIPULATION    . INTRAVASCULAR PRESSURE WIRE/FFR STUDY N/A 05/22/2019   Procedure: INTRAVASCULAR PRESSURE WIRE/FFR STUDY;  Surgeon: Nelva Bush, MD;  Location: [place] [facility];  Service: Cardiovascular;  Laterality: N/A;  . JOINT REPLACEMENT    . KNEE ARTHROSCOPY Left   . LAPAROSCOPIC CHOLECYSTECTOMY    . LEFT HEART CATH N/A 11/21/2016   Procedure: Left Heart Cath;  Surgeon: Leonie Man, MD;  Location: [place] [facility];  Service: Cardiovascular;  Laterality: N/A;  . LITHOTRIPSY    . PILONIDAL CYST EXCISION  56  . RIGHT/LEFT HEART CATH AND CORONARY ANGIOGRAPHY N/A 11/14/2016   Procedure: Right/Left Heart Cath and Coronary Angiography;  Surgeon: Leonie Man, MD;  Location: [place] [facility];  Service: Cardiovascular;  Laterality: N/A;  . RIGHT/LEFT HEART CATH AND CORONARY  ANGIOGRAPHY N/A 05/22/2019   Procedure: RIGHT/LEFT HEART CATH AND CORONARY ANGIOGRAPHY;  Surgeon: Nelva Bush, MD;  Location: [place] [facility];  Service: Cardiovascular;  Laterality: N/A;  . TEMPORARY PACEMAKER N/A 11/21/2016   Procedure: Temporary Pacemaker;  Surgeon: Leonie Man, MD;  Location: [place] [facility];  Service: Cardiovascular;  Laterality: N/A;  . TONSILLECTOMY    . TOTAL KNEE ARTHROPLASTY Left 09/22/2012   Procedure: TOTAL KNEE ARTHROPLASTY;  Surgeon: Lorn Junes, MD;  Location: [place];  Service: Orthopedics;  Laterality: Left;    Current Outpatient Medications  Medication Sig Dispense Refill  . albuterol (VENTOLIN HFA) 108 (90 Base) MCG/ACT inhaler Inhale 2 puffs into the lungs every 6 (six) hours as needed for wheezing or shortness of breath. 8 g 6  . allopurinol (ZYLOPRIM) 300 MG tablet TAKE 1 TABLET DAILY 90 tablet 3  . apixaban (ELIQUIS) 5 MG TABS tablet Take 1 tablet (5 mg total) by mouth 2 (two) times daily. 180 tablet 3  . atorvastatin (LIPITOR) 40 MG tablet TAKE 1 TABLET DAILY 90 tablet 3  . diphenhydramine-acetaminophen (TYLENOL PM) 25-500 MG TABS tablet Take 1 tablet by mouth at bedtime as needed (sleep).    . fluticasone (FLONASE) 50 MCG/ACT nasal spray Place 2 sprays into both nostrils daily. 16 g 6  . nitroGLYCERIN (NITROSTAT) 0.4 MG SL tablet Place 1 tablet (0.4 mg total) under the tongue every 5 (five) minutes as needed for chest pain. 25 tablet 11  . pantoprazole (PROTONIX) 40 MG tablet Take 1 tablet (40 mg total) by mouth daily. 30 tablet 3  . ranolazine (RANEXA) 1000 MG SR tablet Take 1 tablet (1,000 mg total) by mouth 2 (two) times daily. 60 tablet 0  . Tamsulosin HCl (FLOMAX) 0.4 MG CAPS Take 0.4 mg by mouth at bedtime.     . metoprolol tartrate (LOPRESSOR) 25 MG tablet Take 1 tablet (25 mg total) by mouth 2 (two) times daily. 90 tablet 3   No current facility-administered medications for this visit.    Allergies:   Epinephrine, Lisinopril, Penicillins, and Novocain [procaine]   Social History:  The patient  reports that he has never smoked. He has never used smokeless tobacco. He reports previous alcohol use. He reports that he does not use drugs.   Family History:  The patient's family history includes Breast cancer in his mother; Heart attack in his father.  ROS:  Please see the history of present illness.    All other systems are reviewed and otherwise negative.   PHYSICAL EXAM:  VS:  BP 110/70 (BP Location: Left  Arm, Patient Position: Sitting, Cuff Size: Normal)   Pulse 76   Ht 6' (1.829 m)   Wt 249 lb (112.9 kg)   SpO2 94%   BMI 33.77 kg/m  BMI: Body mass index is 33.77 kg/m. Well nourished, well developed, in no acute distress HEENT: normocephalic, atraumatic Neck: no JVD, carotid bruits or masses Cardiac:  RRR; 1-2/6 SM, no rubs, or gallops Lungs:  CTA b/l, no wheezing, rhonchi or rales Abd: soft, nontender MS: no deformity or atrophy Ext: 1+ LLE, trace on R, chronic looking skin cghangesedema Skin: warm and dry, no rash Neuro:  No gross deficits appreciated Psych: euthymic mood, full affect  PPM site is stable, no tethering or discomfort   EKG:  Done today and reviewed by myself AP/VS, 1st degree AVBlockcouple VPbeats  Device interrogation done today and reviewed by myself:  Battery and lead measurements are stable Device observation note:  Check A lead position, 02/24/2020 AT/AF therapies disable because atrial lead position check failed. A sensing 0.4-0.6 (c/w the remotes of the last year A lead threshold 1/4/0.4, also c/w auto thresholds of the last year I did not an infrequent T oversense in A channel and A lead sensitivity was not reduced. No arrhythmias noted  Last was Sept 2020 NSVT/AF episodes All available EGMs were reviewed, very brief true AFib (longest 27 minutes), some were 1:1 AT/ST NSVT episodes both 1:1 and true NSVT   01/20/2020: TTE IMPRESSIONS  1. Left ventricular ejection fraction, by estimation, is 60 to 65%. The  left ventricle has normal function. The left ventricle has no regional  wall motion abnormalities. Left ventricular diastolic parameters are  consistent with Grade I diastolic  dysfunction (impaired relaxation). The average left ventricular global  longitudinal strain is -17.7 %.  2. Right ventricular systolic function is normal. The right ventricular  size is normal. There is moderately elevated pulmonary artery systolic  pressure. The  estimated right ventricular systolic pressure is 46.5 mmHg.  3. The mitral valve is normal in structure. Mild mitral valve  regurgitation. No evidence of mitral stenosis.  4. Tricuspid valve regurgitation is moderate.  5. The aortic valve is normal in structure. Aortic valve regurgitation is  mild. Moderate aortic valve stenosis. Aortic valve mean gradient measures  20.0 mmHg. Aortic valve Vmax measures 2.95 m/s.  6. Pulmonic valve regurgitation is moderate.  7. The inferior vena cava is normal in size with greater than 50%  respiratory variability, suggesting right atrial pressure of 3 mmHg.   Comparison(s): 02/03/19 EF 60-65%. Moderate AS 51mmHg mean PG, 19mmHg peak  PG. When compared to prior, mild increase in aortic stenosis. Repeat ECHO  in one year.     05/22/2019: [facility] Conclusions: 1. Significant single-vessel coronary artery disease with 60% in-stent restenosis of proximal RCA stent that is mildly abnormal by hemodynamic assessment (DFR 0.89; abnormal if less than 0.90). 2. Mild to moderate, non-obstructive coronary artery disease involving the LAD and LCx; LAD stent is widely patent. 3. Mildly elevated left heart, right heart, and pulmonary artery pressures. 4. Mild to moderate aortic valve stenosis. 5. Mildly reduced Fick cardiac output/index.  Recommendations: 1. Optimize medical therapy, including aggressive antianginal therapy.  We will add isosorbide mononitrate 30 mg daily, to be escalated as tolerated. 2. If symptoms persist/worsen despite maximal tolerated doses of at least two antianginal agents, PCI to proximal RCA in-stent restenosis will need to be considered. 3. Aggressive secondary prevention. 4. Restart apixaban tomorrow morning if no evidence of bleeding or vascular injury at catheterization sites.    Recent Labs: 01/29/2020: ALT 24; BUN 39; Creatinine, Ser 1.79; Hemoglobin 13.5; Platelets 208.0; Potassium 4.1; Sodium 136  No results found for requested  labs within last 8760 hours.   CrCl cannot be calculated (Patient's most recent lab result is older than the maximum 21 days allowed.).   Wt Readings from Last 3 Encounters:  09/21/20 249 lb (112.9 kg)  06/02/20 244 lb 6.4 oz (110.9 kg)  03/29/20 242 lb (109.8 kg)     Other studies reviewed: Additional studies/records reviewed today include: summarized above  ASSESSMENT AND PLAN:  1. PPM     Intact function     measurements c/w remotes over the last year or so     No programming changes made  Discussed annual visits  2. CAD     No ischemic symptoms     On BB, ranexa, statin  C/w Dr. Marlou Porch  3. HTN     No changes   4. VHD      Mod AS      Echo last year describes AO root normal in size  5. NSVT     None in > a year  6. PAFib     All very breif, none in > a year     CHA2DS2Vasc is 4, already on Eliquis for his DVT/PE history  Disposition: F/u with remotes as usual, in clinic with EP in a year, sooner if needed  Current medicines are reviewed at length with the patient today.  The patient did not have any concerns regarding medicines.  Venetia Night, PA-C 09/21/2020 1:19 PM     [address] [place] [address] [phone] (office)  [phone] (fax)

## 2020-09-21 NOTE — Patient Instructions (Signed)
Medication Instructions:  ° ° °Your physician recommends that you continue on your current medications as directed. Please refer to the Current Medication list given to you today. ° °*If you need a refill on your cardiac medications before your next appointment, please call your pharmacy* ° ° °Lab Work: NONE ORDERED  TODAY ° ° ° °If you have labs (blood work) drawn today and your tests are completely normal, you will receive your results only by: °• MyChart Message (if you have MyChart) OR °• A paper copy in the mail °If you have any lab test that is abnormal or we need to change your treatment, we will call you to review the results. ° ° °Testing/Procedures:NONE ORDERED  TODAY ° ° ° ° °Follow-Up: °At [facility], you and your health needs are our priority.  As part of our continuing mission to provide you with exceptional heart care, we have created designated Provider Care Teams.  These Care Teams include your primary Cardiologist (physician) and Advanced Practice Providers (APPs -  Physician Assistants and Nurse Practitioners) who all work together to provide you with the care you need, when you need it. ° °We recommend signing up for the patient portal called "MyChart".  Sign up information is provided on this After Visit Summary.  MyChart is used to connect with patients for Virtual Visits (Telemedicine).  Patients are able to view lab/test results, encounter notes, upcoming appointments, etc.  Non-urgent messages can be sent to your provider as well.   °To learn more about what you can do with MyChart, go to [email or web address].   ° °Your next appointment:   °1 year(s) ° °The format for your next appointment:   °In Person ° °Provider:   °You may see Will Martin Camnitz, MD or one of the following Advanced Practice Providers on your designated Care Team:   °· Amber Seiler, NP °· Renee Ursuy, PA-C °· Michael "Andy" Tillery, PA-C ° ° ° °Other Instructions ° ° °

## 2020-09-27 NOTE — Addendum Note (Signed)
Addended by: Maren Beach, Rona Tomson A on: 09/27/2020 08:48 AM   Modules accepted: Orders

## 2020-10-05 ENCOUNTER — Ambulatory Visit (INDEPENDENT_AMBULATORY_CARE_PROVIDER_SITE_OTHER): Payer: Medicare Other

## 2020-10-05 DIAGNOSIS — I495 Sick sinus syndrome: Secondary | ICD-10-CM

## 2020-10-06 ENCOUNTER — Telehealth: Payer: Self-pay | Admitting: Cardiology

## 2020-10-06 NOTE — Telephone Encounter (Signed)
Called patient and advised that I cannot find any notes about a call from our office. He verbalized understanding and hung up.

## 2020-10-06 NOTE — Telephone Encounter (Signed)
Patient states he is returning a call from today. I did not see any notes. 

## 2020-10-07 LAB — CUP PACEART REMOTE DEVICE CHECK
Battery Remaining Longevity: 70 mo
Battery Voltage: 2.92 V
Brady Statistic AP VP Percent: 13.91 %
Brady Statistic AP VS Percent: 85.37 %
Brady Statistic AS VP Percent: 0.02 %
Brady Statistic AS VS Percent: 0.7 %
Brady Statistic RA Percent Paced: 99.35 %
Brady Statistic RV Percent Paced: 13.93 %
Date Time Interrogation Session: 20220225070956
Implantable Lead Implant Date: 20190116
Implantable Lead Implant Date: 20190116
Implantable Lead Location: 753859
Implantable Lead Location: 753860
Implantable Lead Model: 5076
Implantable Lead Model: 5076
Implantable Pulse Generator Implant Date: 20190116
Lead Channel Impedance Value: 285 Ohm
Lead Channel Impedance Value: 304 Ohm
Lead Channel Impedance Value: 361 Ohm
Lead Channel Impedance Value: 456 Ohm
Lead Channel Pacing Threshold Amplitude: 0.75 V
Lead Channel Pacing Threshold Amplitude: 1 V
Lead Channel Pacing Threshold Pulse Width: 0.4 ms
Lead Channel Pacing Threshold Pulse Width: 0.4 ms
Lead Channel Sensing Intrinsic Amplitude: 0.625 mV
Lead Channel Sensing Intrinsic Amplitude: 0.625 mV
Lead Channel Sensing Intrinsic Amplitude: 7.625 mV
Lead Channel Sensing Intrinsic Amplitude: 7.625 mV
Lead Channel Setting Pacing Amplitude: 2 V
Lead Channel Setting Pacing Amplitude: 3.75 V
Lead Channel Setting Pacing Pulse Width: 0.4 ms
Lead Channel Setting Sensing Sensitivity: 1.2 mV

## 2020-10-12 NOTE — Progress Notes (Signed)
Remote pacemaker transmission.   

## 2020-11-08 DIAGNOSIS — E669 Obesity, unspecified: Secondary | ICD-10-CM | POA: Diagnosis not present

## 2020-11-08 DIAGNOSIS — Z6831 Body mass index (BMI) 31.0-31.9, adult: Secondary | ICD-10-CM | POA: Diagnosis not present

## 2020-11-08 DIAGNOSIS — M5136 Other intervertebral disc degeneration, lumbar region: Secondary | ICD-10-CM | POA: Diagnosis not present

## 2020-11-13 DIAGNOSIS — I129 Hypertensive chronic kidney disease with stage 1 through stage 4 chronic kidney disease, or unspecified chronic kidney disease: Secondary | ICD-10-CM | POA: Diagnosis not present

## 2020-11-13 DIAGNOSIS — N400 Enlarged prostate without lower urinary tract symptoms: Secondary | ICD-10-CM | POA: Diagnosis not present

## 2020-11-13 DIAGNOSIS — J189 Pneumonia, unspecified organism: Secondary | ICD-10-CM | POA: Diagnosis not present

## 2020-11-13 DIAGNOSIS — Z86718 Personal history of other venous thrombosis and embolism: Secondary | ICD-10-CM | POA: Diagnosis not present

## 2020-11-13 DIAGNOSIS — E860 Dehydration: Secondary | ICD-10-CM | POA: Diagnosis not present

## 2020-11-13 DIAGNOSIS — Z96659 Presence of unspecified artificial knee joint: Secondary | ICD-10-CM | POA: Diagnosis not present

## 2020-11-13 DIAGNOSIS — M109 Gout, unspecified: Secondary | ICD-10-CM | POA: Diagnosis not present

## 2020-11-13 DIAGNOSIS — R600 Localized edema: Secondary | ICD-10-CM | POA: Diagnosis not present

## 2020-11-13 DIAGNOSIS — J9811 Atelectasis: Secondary | ICD-10-CM | POA: Diagnosis not present

## 2020-11-13 DIAGNOSIS — Z20822 Contact with and (suspected) exposure to covid-19: Secondary | ICD-10-CM | POA: Diagnosis not present

## 2020-11-13 DIAGNOSIS — R404 Transient alteration of awareness: Secondary | ICD-10-CM | POA: Diagnosis not present

## 2020-11-13 DIAGNOSIS — Z7902 Long term (current) use of antithrombotics/antiplatelets: Secondary | ICD-10-CM | POA: Diagnosis not present

## 2020-11-13 DIAGNOSIS — I495 Sick sinus syndrome: Secondary | ICD-10-CM | POA: Diagnosis not present

## 2020-11-13 DIAGNOSIS — I2510 Atherosclerotic heart disease of native coronary artery without angina pectoris: Secondary | ICD-10-CM | POA: Diagnosis not present

## 2020-11-13 DIAGNOSIS — Z87442 Personal history of urinary calculi: Secondary | ICD-10-CM | POA: Diagnosis not present

## 2020-11-13 DIAGNOSIS — Z96652 Presence of left artificial knee joint: Secondary | ICD-10-CM | POA: Diagnosis not present

## 2020-11-13 DIAGNOSIS — Z9049 Acquired absence of other specified parts of digestive tract: Secondary | ICD-10-CM | POA: Diagnosis not present

## 2020-11-13 DIAGNOSIS — R069 Unspecified abnormalities of breathing: Secondary | ICD-10-CM | POA: Diagnosis not present

## 2020-11-13 DIAGNOSIS — Z950 Presence of cardiac pacemaker: Secondary | ICD-10-CM | POA: Diagnosis not present

## 2020-11-13 DIAGNOSIS — E785 Hyperlipidemia, unspecified: Secondary | ICD-10-CM | POA: Diagnosis not present

## 2020-11-13 DIAGNOSIS — J9601 Acute respiratory failure with hypoxia: Secondary | ICD-10-CM | POA: Diagnosis not present

## 2020-11-13 DIAGNOSIS — N189 Chronic kidney disease, unspecified: Secondary | ICD-10-CM | POA: Diagnosis not present

## 2020-11-13 DIAGNOSIS — I340 Nonrheumatic mitral (valve) insufficiency: Secondary | ICD-10-CM | POA: Diagnosis not present

## 2020-11-13 DIAGNOSIS — Z86711 Personal history of pulmonary embolism: Secondary | ICD-10-CM | POA: Diagnosis not present

## 2020-11-13 DIAGNOSIS — I272 Pulmonary hypertension, unspecified: Secondary | ICD-10-CM | POA: Diagnosis not present

## 2020-11-13 DIAGNOSIS — R0902 Hypoxemia: Secondary | ICD-10-CM | POA: Diagnosis not present

## 2020-11-13 DIAGNOSIS — R0602 Shortness of breath: Secondary | ICD-10-CM | POA: Diagnosis not present

## 2020-11-13 DIAGNOSIS — R410 Disorientation, unspecified: Secondary | ICD-10-CM | POA: Diagnosis not present

## 2020-11-13 DIAGNOSIS — Z79899 Other long term (current) drug therapy: Secondary | ICD-10-CM | POA: Diagnosis not present

## 2020-11-13 DIAGNOSIS — I350 Nonrheumatic aortic (valve) stenosis: Secondary | ICD-10-CM | POA: Diagnosis not present

## 2020-11-13 DIAGNOSIS — N183 Chronic kidney disease, stage 3 unspecified: Secondary | ICD-10-CM | POA: Diagnosis not present

## 2020-11-13 DIAGNOSIS — R0689 Other abnormalities of breathing: Secondary | ICD-10-CM | POA: Diagnosis not present

## 2020-11-13 DIAGNOSIS — Z9861 Coronary angioplasty status: Secondary | ICD-10-CM | POA: Diagnosis not present

## 2020-11-13 DIAGNOSIS — Z880 Allergy status to penicillin: Secondary | ICD-10-CM | POA: Diagnosis not present

## 2020-11-16 ENCOUNTER — Telehealth: Payer: Self-pay

## 2020-11-16 ENCOUNTER — Encounter: Payer: Self-pay | Admitting: Family Medicine

## 2020-11-16 DIAGNOSIS — I2782 Chronic pulmonary embolism: Secondary | ICD-10-CM

## 2020-11-16 DIAGNOSIS — M7989 Other specified soft tissue disorders: Secondary | ICD-10-CM

## 2020-11-16 NOTE — Telephone Encounter (Signed)
15 minutes is ok. Don't use a nodule slot

## 2020-11-16 NOTE — Telephone Encounter (Signed)
[name] called in refrerence to the patient to let us know he was off of his eliqus for 2 days when he was in the hospital and he his having some swelling in his left leg. He isn't complaining of any pain in his foot but when he gets up to walk he is having leg pain. No redness or warmth in his leg. He is back on his eliqus and wanted you to be aware. Is there anything he needs to do ?

## 2020-11-16 NOTE — Telephone Encounter (Signed)
Attempted to call pt but unable to reach. Left message for him to return call. °

## 2020-11-16 NOTE — Telephone Encounter (Signed)
Dr. Lamonte Sakai, please advise if you are okay with appt being in a 43min slot or if we should use 35min slot due to it being hospital follow up and also please advise if you want this appt to be first avail with you or to use nodule slot. First 48min slot is not until 5/4 and first 81min slot for hospital follow up is not until 5/13.

## 2020-11-16 NOTE — Telephone Encounter (Signed)
Patient has been scheduled

## 2020-11-16 NOTE — Telephone Encounter (Signed)
Please set him up for an OV with RB. Thanks.

## 2020-11-17 ENCOUNTER — Other Ambulatory Visit: Payer: Self-pay

## 2020-11-17 DIAGNOSIS — M7989 Other specified soft tissue disorders: Secondary | ICD-10-CM

## 2020-11-17 MED ORDER — APIXABAN (ELIQUIS) VTE STARTER PACK (10MG AND 5MG): ORAL_TABLET | ORAL | 0 refills | Status: DC

## 2020-11-17 NOTE — Telephone Encounter (Signed)
Stat ultrasound of the legs ordered-please work with Lattie Haw to get this set up-want to make sure there is no clot with his history.  He is probably fine on the Eliquis current dose but if there was a new clot we may need to use a higher dose short-term

## 2020-11-17 NOTE — Telephone Encounter (Signed)
New order placed

## 2020-11-17 NOTE — Telephone Encounter (Signed)
Hey! If Lattie Haw isn't in can you work on this please?

## 2020-11-17 NOTE — Telephone Encounter (Signed)
Pt returned call. Pt is in [place] until 4/13. Pt states he is going to contact the local hospital there and see if they have the ability to do the DVT ultrasound there. If so, patient will get their information for [place] to send order over.

## 2020-11-17 NOTE — Telephone Encounter (Signed)
See below

## 2020-11-17 NOTE — Telephone Encounter (Signed)
Pt returned call. Pt would like order sent to [facility]. Their fax # is [phone]. We need to mark it as STAT. Please change location in order and I will fax it to [place].

## 2020-11-17 NOTE — Telephone Encounter (Signed)
Contact[facility], they have openings. When I tried to get in contact with pt I did not receive an answer. Tried calling pt, his wife, and his son multiple times with no answer. LVM for pt to contact us ASAP.

## 2020-11-17 NOTE — Telephone Encounter (Signed)
FYI

## 2020-11-18 ENCOUNTER — Other Ambulatory Visit: Payer: Self-pay

## 2020-11-18 ENCOUNTER — Telehealth: Payer: Self-pay

## 2020-11-18 MED ORDER — APIXABAN (ELIQUIS) VTE STARTER PACK (10MG AND 5MG): ORAL_TABLET | ORAL | 0 refills | Status: DC

## 2020-11-18 NOTE — Telephone Encounter (Signed)
Team can send to new pharmacy-the higher dose Eliquis starter pack I sent in yesterday.  He can take 2 of his current pills twice daily so total of 10 mg Eliquis and this will be for 7 days.  I sent details in my chart message.  What additional question is does he have?  If there are a lot of questions we may need to simply set him up for a virtual visit

## 2020-11-18 NOTE — Telephone Encounter (Signed)
Pt.'s medication for his DVT in leg went to wrong pharmacy    [facility], [address], [place], [place] [address]    Pt was told by hospital not to travel, due to DVT. Pt is wanting instructions of what needs to be done.  [phone]

## 2020-11-18 NOTE — Telephone Encounter (Signed)
Script has been sent into the correct pharmacy. Patient gave a verbal understanding. Didn't have any other questions.

## 2020-11-23 ENCOUNTER — Other Ambulatory Visit: Payer: Self-pay | Admitting: Family Medicine

## 2020-11-25 ENCOUNTER — Encounter: Payer: Self-pay | Admitting: Family Medicine

## 2020-11-29 NOTE — Progress Notes (Signed)
Phone [phone]   Subjective:  [name] is a 87 y.o. year old very pleasant male patient who presents for hospital follow up for Hypoxia related to pneumonia.    See problem oriented charting as well  Past Medical History-  Patient Active Problem List   Diagnosis Date Noted  . Acute deep vein thrombosis (DVT) of distal vein of left lower extremity ([place]) 11/30/2020    Priority: High  . Sick sinus syndrome ([place]) 10/05/2017    Priority: High  . Chronic pulmonary embolism ([place]) 12/17/2016    Priority: High  . Status post coronary artery stent placement     Priority: High  . Coronary artery disease involving native coronary artery with angina pectoris ([place]) 11/21/2016    Priority: High  . Moderate aortic stenosis 10/31/2016    Priority: High  . Exertional shortness of breath: Concern for Anginal Equivalent (class II-III) 10/30/2016    Priority: High  . CKD (chronic kidney disease), stage III ([place]) 09/22/2012    Priority: High  . Gout 12/13/2010    Priority: Medium  . BPH (benign prostatic hyperplasia) 12/05/2009    Priority: Medium  . Hyperlipidemia 05/08/2007    Priority: Medium  . Essential hypertension 05/08/2007    Priority: Medium  . Edema 12/08/2014    Priority: Low  . Healthcare maintenance 12/08/2014    Priority: Low  . Left knee DJD 09/22/2012    Priority: Low  . Dyspnea 02/03/2020  . Dilated aortic root ([place]) 11/08/2016    Medications- reviewed and updated  A medical reconciliation was performed comparing current medicines to hospital discharge medications. Current Outpatient Medications  Medication Sig Dispense Refill  . allopurinol (ZYLOPRIM) 300 MG tablet TAKE 1 TABLET DAILY 90 tablet 3  . apixaban (ELIQUIS) 5 MG TABS tablet Take 1 tablet (5 mg total) by mouth 2 (two) times daily. 180 tablet 3  . atorvastatin (LIPITOR) 40 MG tablet TAKE 1 TABLET DAILY 90 tablet 3  . cephALEXin (KEFLEX) 500 MG capsule Take 1 capsule (500 mg total) by mouth 3  (three) times daily for 7 days. Hives on Penicillin in past but has tolerated cephalosporins 21 capsule 0  . diphenhydramine-acetaminophen (TYLENOL PM) 25-500 MG TABS tablet Take 1 tablet by mouth at bedtime as needed (sleep).    . finasteride (PROSCAR) 5 MG tablet Take 5 mg by mouth daily.    . metoprolol tartrate (LOPRESSOR) 25 MG tablet Take 1 tablet (25 mg total) by mouth 2 (two) times daily. 90 tablet 3  . nitroGLYCERIN (NITROSTAT) 0.4 MG SL tablet Place 1 tablet (0.4 mg total) under the tongue every 5 (five) minutes as needed for chest pain. 25 tablet 11  . ranolazine (RANEXA) 1000 MG SR tablet Take 1 tablet (1,000 mg total) by mouth 2 (two) times daily. 60 tablet 0  . Tamsulosin HCl (FLOMAX) 0.4 MG CAPS Take 0.4 mg by mouth at bedtime.      No current facility-administered medications for this visit.   Objective  Objective:  BP 120/76   Pulse 76   Temp 97.6 F (36.4 C) (Temporal)   Ht 6' (1.829 m)   Wt 248 lb (112.5 kg)   SpO2 98%   BMI 33.63 kg/m  Gen: NAD, resting comfortably CV: RRR stable systolic murmur Lungs: CTAB no crackles, wheeze, rhonchi Abdomen: soft/nontender/nondistended/normal bowel sounds.  Ext: no edema Skin: warm, dry    Assessment and Plan:   #Pneumonia/hypoxia #Acute DVT S: Patient presented on April 3 to [facility] hospital in [place] with  shortness of breath, nausea, fever, chills and found to be hypoxic down to 80%-taken to the hospital and chest x-ray showed right elevated hemidiaphragm and concern was that pneumonia was not seen well as a result-White count 13,000, procalcitonin 3.4.  Patient had a CT angiogram which was negative for pulmonary embolism but they were unable to rule out pneumonia once again due to elevated right hemidiaphragm.  Patient was treated with Rocephin and azithromycin.  Oxygen continued to drop with ambulation at first but improved before discharge and did not have to go home on oxygen.  Discharged on cefpodoxime and  azithromycin.  Recommended outpatient follow-up with primary care.  Patient also had an echocardiogram while hospitalized showing indeterminate diastolic dysfunction, mild mitral valve regurgitation, mild aortic valve regurgitation, moderate aortic stenosis, potential mild pulmonary hypertension based off of pulmonary artery systolic pressure . Blood cultures were negative at time of discharge-I cannot see updated report at this time.   Similar episode 8 years ago when [facility] was [facility]  Apparently patient's Eliquis was held short-term in the hospital-interestingly enough patient developed a new DVT during this brief interval.  We placed patient on Eliquis 10 mg twice daily for 7 days and he has now resumed 5 mg twice daily - patient says his eliquis was not given for 3 days straight while hospitalized A/P: Pneumonia/acute illness appears to have resolved-patient completed appropriate antibiotics.  Procalcitonin and white count were elevated pointing towards bacterial process.  That being said no clear-cut findings on imaging due to elevated hemidiaphragm-I do not think repeat imaging would be helpful at this time especially with his improvement.  In regards to acute DVT of the left leg- appears this occurred with patient bedbound for several days and off of Eliquis for 3 days-unclear why this was held in the hospital..  He has completed 10 mg twice daily of Eliquis for 7 days and noted substantial improvement in leg swelling.  We will continue on 5 mg twice daily indefinitely.  We discussed possible hematology consult (also discussed potentially testing him in case children or grandchildren need to be tested for hypercoagulability but he declines) but not sure this would change management-we need to be careful if he needs to come off of Eliquis in the future due to risk of recurrence. Patients baseline chronic cough had improved and no significant worsening after recent pneumonia  On the other hand  patient's left leg is warm, tender, and red-Per AVS " I do not love the warmth, redness, tenderness of your left leg- some of this could be related to the blood clot in the leg and resulting swelling but I also want to make sure there is no underlying skin infection- Keflex will treat cellulitis/skin infection-if area worsens or fails to continue to improve please let us know" -Certainly this could partially be related to the DVT but want to be cautious    #CAD with angina/hyperlipidemia/chronic pulmonary embolism S: Medication: Atorvastatin 40 mg daily.  Ranexa 1000 mg twice daily-may cause constipation.  Also on metoprolol 25 mg twice daily.  Reports nitroglycerin use-  - stable shortness of breath after pneunomia   Chronic pulmonary embolism-patient is on Eliquis 5 mg twice daily- had been on short term higher dose as ebleow.  With chronic Eliquis cardiology has decided not to place him on aspirin or Plavix. A/P: cad appears stable-no angina on Ranexa- continue current meds. HLD hopefully stable- update lipids since we are checking labs-likely continue current medication. PE- back on full dose  eliquis and thankfully no clot in lungsb  #Moderate aortic stenosis- murmur stable today-noted on echocardiogram during hospitalization as well   #Gout S: No recent flares on allopurinol 300 mg Lab Results  Component Value Date   LABURIC 5.2 01/21/2019  A/P: Update uric acid since we are updating labs today  #GERD S: Controlled on pantoprazole 40 mg.  In April 2021-this was used to try to suppress chronic cough  A/P: Patient doing well with medication-maybe contributing to improve chronic cough  Recommended follow up: 3 to 6 months discussed Future Appointments  Date Time Provider [place]  12/12/2020  9:20 AM Jerline Pain, MD [facility]  12/23/2020 10:00 AM Collene Gobble, MD [facility] None  01/04/2021  7:30 AM [facility] DEVICE REMOTES [facility]   04/05/2021  7:30 AM [facility] DEVICE REMOTES [facility]  07/05/2021  7:30 AM [facility] DEVICE REMOTES [facility]  10/04/2021  7:30 AM [facility] DEVICE REMOTES [facility]  01/03/2022  7:30 AM [facility] DEVICE REMOTES [facility]    Lab/Order associations:   ICD-10-CM   1. Community acquired pneumonia of right lower lobe of lung  J18.9   2. Hyperlipidemia, unspecified hyperlipidemia type  E78.5 CBC with Differential/Platelet    Comprehensive metabolic panel    Lipid panel  3. Chronic gout without tophus, unspecified cause, unspecified site  [id number].[id number] Uric acid  4. Other chronic pulmonary embolism without acute cor pulmonale (HCC) Chronic I27.82   5. Coronary artery disease involving native coronary artery of native heart with angina pectoris (HCC) Chronic I25.119   6. Acute deep vein thrombosis (DVT) of distal vein of left lower extremity (HCC)  I82.4Z2     Meds ordered this encounter  Medications  . cephALEXin (KEFLEX) 500 MG capsule    Sig: Take 1 capsule (500 mg total) by mouth 3 (three) times daily for 7 days. Hives on Penicillin in past but has tolerated cephalosporins    Dispense:  21 capsule    Refill:  0   Time Spent:4 41 minutes of total time (11:35 PM- 12:16 PM was spent on the date of the encounter performing the following actions: chart review prior to seeing the patient, obtaining history, performing a medically necessary exam, counseling on the treatment plan, placing orders, and documenting in our EHR.    Return precautions advised.  Garret Reddish, MD

## 2020-11-30 ENCOUNTER — Encounter: Payer: Self-pay | Admitting: Family Medicine

## 2020-11-30 ENCOUNTER — Ambulatory Visit (INDEPENDENT_AMBULATORY_CARE_PROVIDER_SITE_OTHER): Payer: Medicare Other | Admitting: Family Medicine

## 2020-11-30 ENCOUNTER — Other Ambulatory Visit: Payer: Self-pay

## 2020-11-30 VITALS — BP 120/76 | HR 76 | Temp 97.6°F | Ht 72.0 in | Wt 248.0 lb

## 2020-11-30 DIAGNOSIS — M1A9XX0 Chronic gout, unspecified, without tophus (tophi): Secondary | ICD-10-CM | POA: Diagnosis not present

## 2020-11-30 DIAGNOSIS — J189 Pneumonia, unspecified organism: Secondary | ICD-10-CM | POA: Diagnosis not present

## 2020-11-30 DIAGNOSIS — I2782 Chronic pulmonary embolism: Secondary | ICD-10-CM

## 2020-11-30 DIAGNOSIS — I824Z2 Acute embolism and thrombosis of unspecified deep veins of left distal lower extremity: Secondary | ICD-10-CM | POA: Insufficient documentation

## 2020-11-30 DIAGNOSIS — E785 Hyperlipidemia, unspecified: Secondary | ICD-10-CM | POA: Diagnosis not present

## 2020-11-30 DIAGNOSIS — I25119 Atherosclerotic heart disease of native coronary artery with unspecified angina pectoris: Secondary | ICD-10-CM | POA: Diagnosis not present

## 2020-11-30 LAB — COMPREHENSIVE METABOLIC PANEL
ALT: 19 U/L (ref 0–53)
AST: 18 U/L (ref 0–37)
Albumin: 3.6 g/dL (ref 3.5–5.2)
Alkaline Phosphatase: 89 U/L (ref 39–117)
BUN: 34 mg/dL — ABNORMAL HIGH (ref 6–23)
CO2: 29 mEq/L (ref 19–32)
Calcium: 9.6 mg/dL (ref 8.4–10.5)
Chloride: 105 mEq/L (ref 96–112)
Creatinine, Ser: 1.68 mg/dL — ABNORMAL HIGH (ref 0.40–1.50)
GFR: 36.95 mL/min — ABNORMAL LOW (ref >60.00)
Glucose, Bld: 92 mg/dL (ref 70–99)
Potassium: 4.8 mEq/L (ref 3.5–5.1)
Sodium: 140 mEq/L (ref 135–145)
Total Bilirubin: 0.7 mg/dL (ref 0.2–1.2)
Total Protein: 6.4 g/dL (ref 6.0–8.3)

## 2020-11-30 LAB — LIPID PANEL
Cholesterol: 122 mg/dL (ref 0–200)
HDL: 42.2 mg/dL (ref >39.00)
LDL Cholesterol: 58 mg/dL (ref 0–99)
NonHDL: 79.41
Total CHOL/HDL Ratio: 3
Triglycerides: 106 mg/dL (ref 0.0–149.0)
VLDL: 21.2 mg/dL (ref 0.0–40.0)

## 2020-11-30 LAB — CBC WITH DIFFERENTIAL/PLATELET
Basophils Absolute: 0.1 10*3/uL (ref 0.0–0.1)
Basophils Relative: 0.8 % (ref 0.0–3.0)
Eosinophils Absolute: 0.2 10*3/uL (ref 0.0–0.7)
Eosinophils Relative: 3 % (ref 0.0–5.0)
HCT: 41.2 % (ref 39.0–52.0)
Hemoglobin: 13.8 g/dL (ref 13.0–17.0)
Lymphocytes Relative: 26.3 % (ref 12.0–46.0)
Lymphs Abs: 2 10*3/uL (ref 0.7–4.0)
MCHC: 33.4 g/dL (ref 30.0–36.0)
MCV: 95.2 fl (ref 78.0–100.0)
Monocytes Absolute: 0.9 10*3/uL (ref 0.1–1.0)
Monocytes Relative: 12.5 % — ABNORMAL HIGH (ref 3.0–12.0)
Neutro Abs: 4.3 10*3/uL (ref 1.4–7.7)
Neutrophils Relative %: 57.4 % (ref 43.0–77.0)
Platelets: 221 10*3/uL (ref 150.0–400.0)
RBC: 4.33 Mil/uL (ref 4.22–5.81)
RDW: 14.2 % (ref 11.5–15.5)
WBC: 7.6 10*3/uL (ref 4.0–10.5)

## 2020-11-30 LAB — URIC ACID: Uric Acid, Serum: 4.3 mg/dL (ref 4.0–7.8)

## 2020-11-30 MED ORDER — CEPHALEXIN 500 MG PO CAPS: 500.0000 mg | ORAL_CAPSULE | Freq: Three times a day (TID) | ORAL | 0 refills | Status: AC

## 2020-11-30 NOTE — Patient Instructions (Addendum)
Health Maintenance Due  Topic Date Due  . TETANUS/TDAP - due for this- consider at pharmacy 12/06/2019   I do not love the warmth, redness, tenderness of your left leg- some of this could be related to the blood clot in the leg and resulting swelling but I also want to make sure there is no underlying skin infection- Keflex will treat cellulitis/skin infection-if area worsens or fails to continue to improve please let us know  Continue eliquis 5 mg twice daily  Please stop by lab before you go If you have mychart- we will send your results within 3 business days of [place] receiving them.  If you do not have mychart- we will call you about results within 5 business days of [place] receiving them.  *please also note that you will see labs on mychart as soon as they post. I will later go in and write notes on them- will say "notes from Dr. Yong Channel"  Recommended follow up: 3-6 month follow up

## 2020-12-02 DIAGNOSIS — R3914 Feeling of incomplete bladder emptying: Secondary | ICD-10-CM | POA: Diagnosis not present

## 2020-12-02 DIAGNOSIS — N401 Enlarged prostate with lower urinary tract symptoms: Secondary | ICD-10-CM | POA: Diagnosis not present

## 2020-12-05 ENCOUNTER — Other Ambulatory Visit: Payer: Self-pay | Admitting: Family Medicine

## 2020-12-06 IMAGING — DX DG CHEST 2V
2 series · 2 of 2 positions shown · non-contrast
Comparison: 05/19/2019

CLINICAL DATA: Chronic cough

EXAM:
CHEST - 2 VIEW

[chest pa]
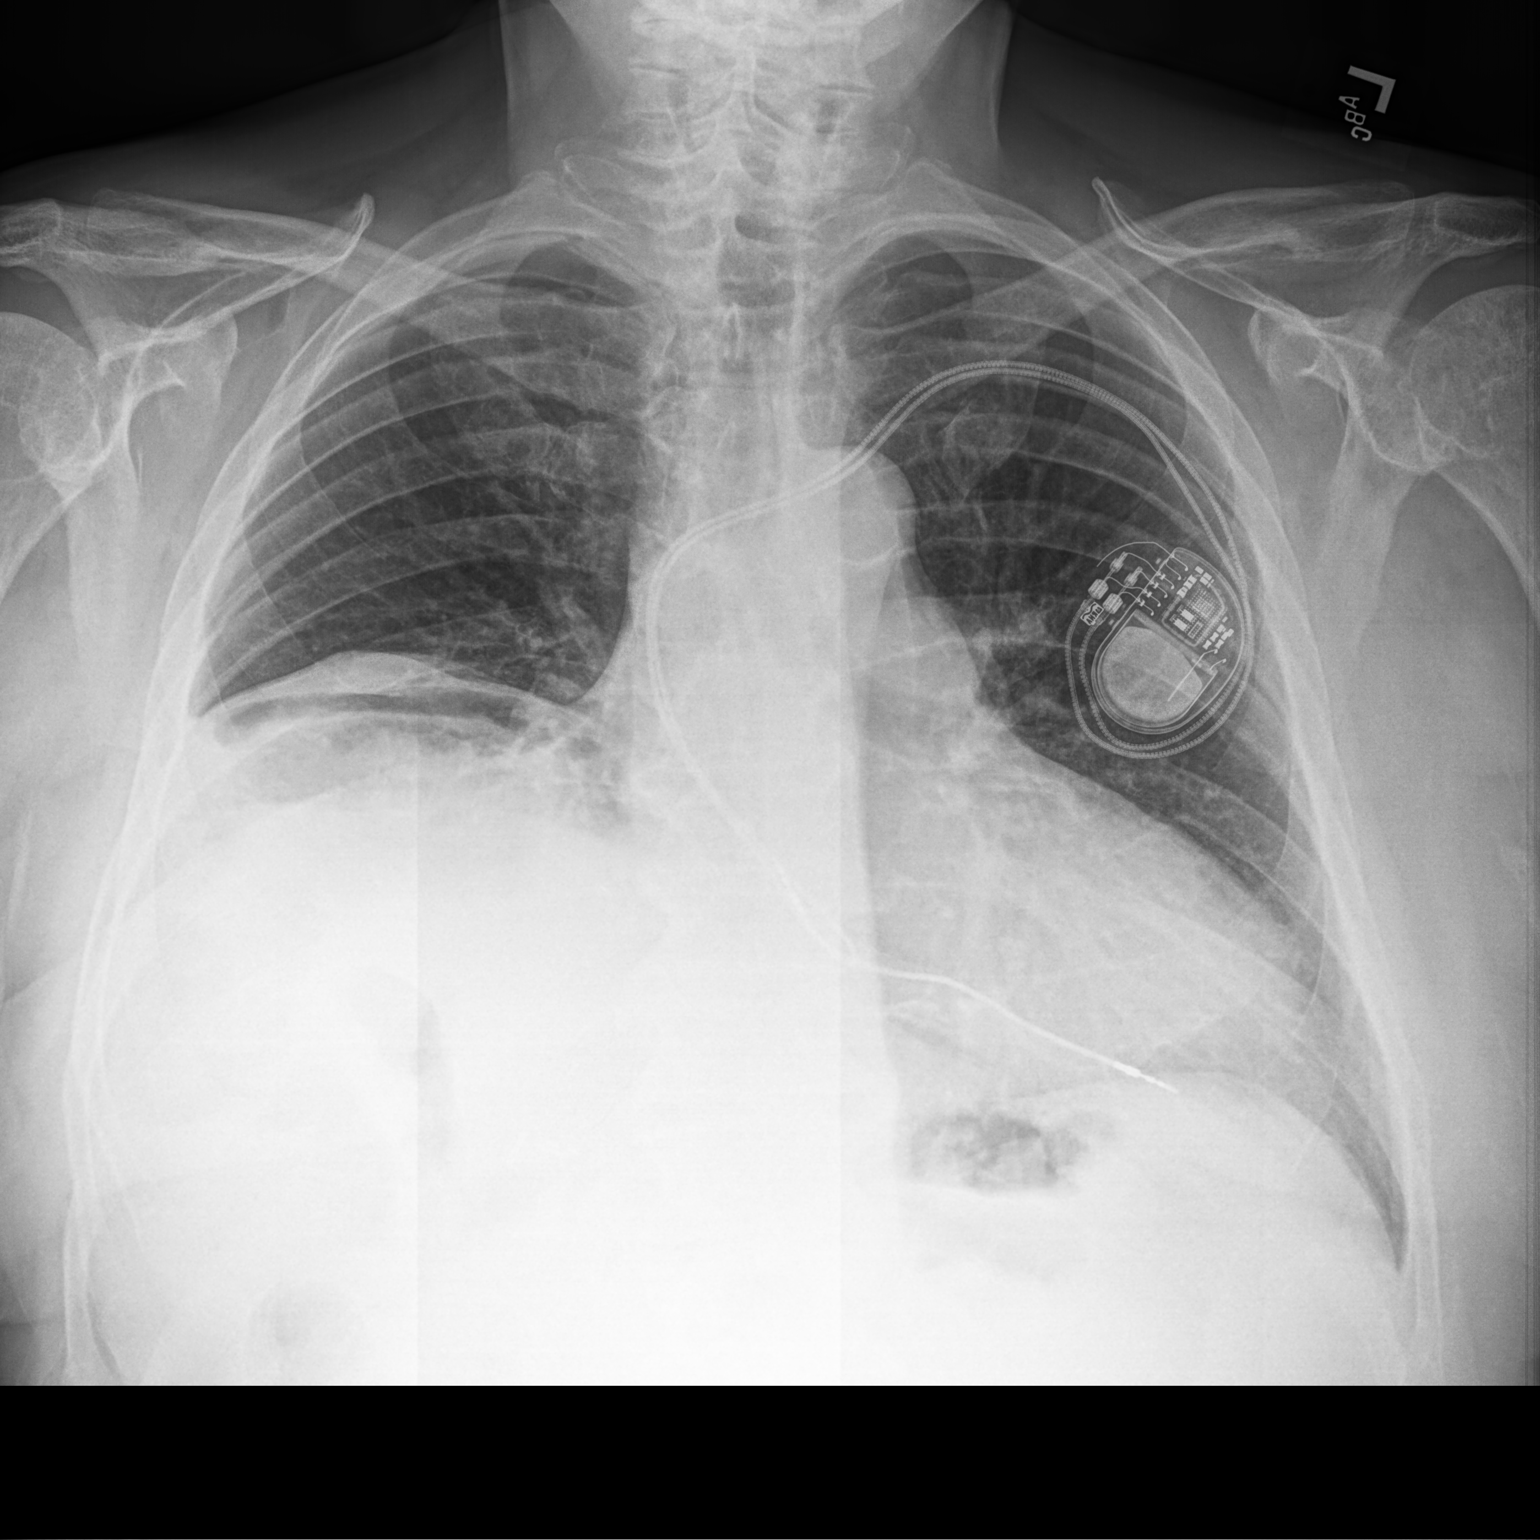

[chest lat]
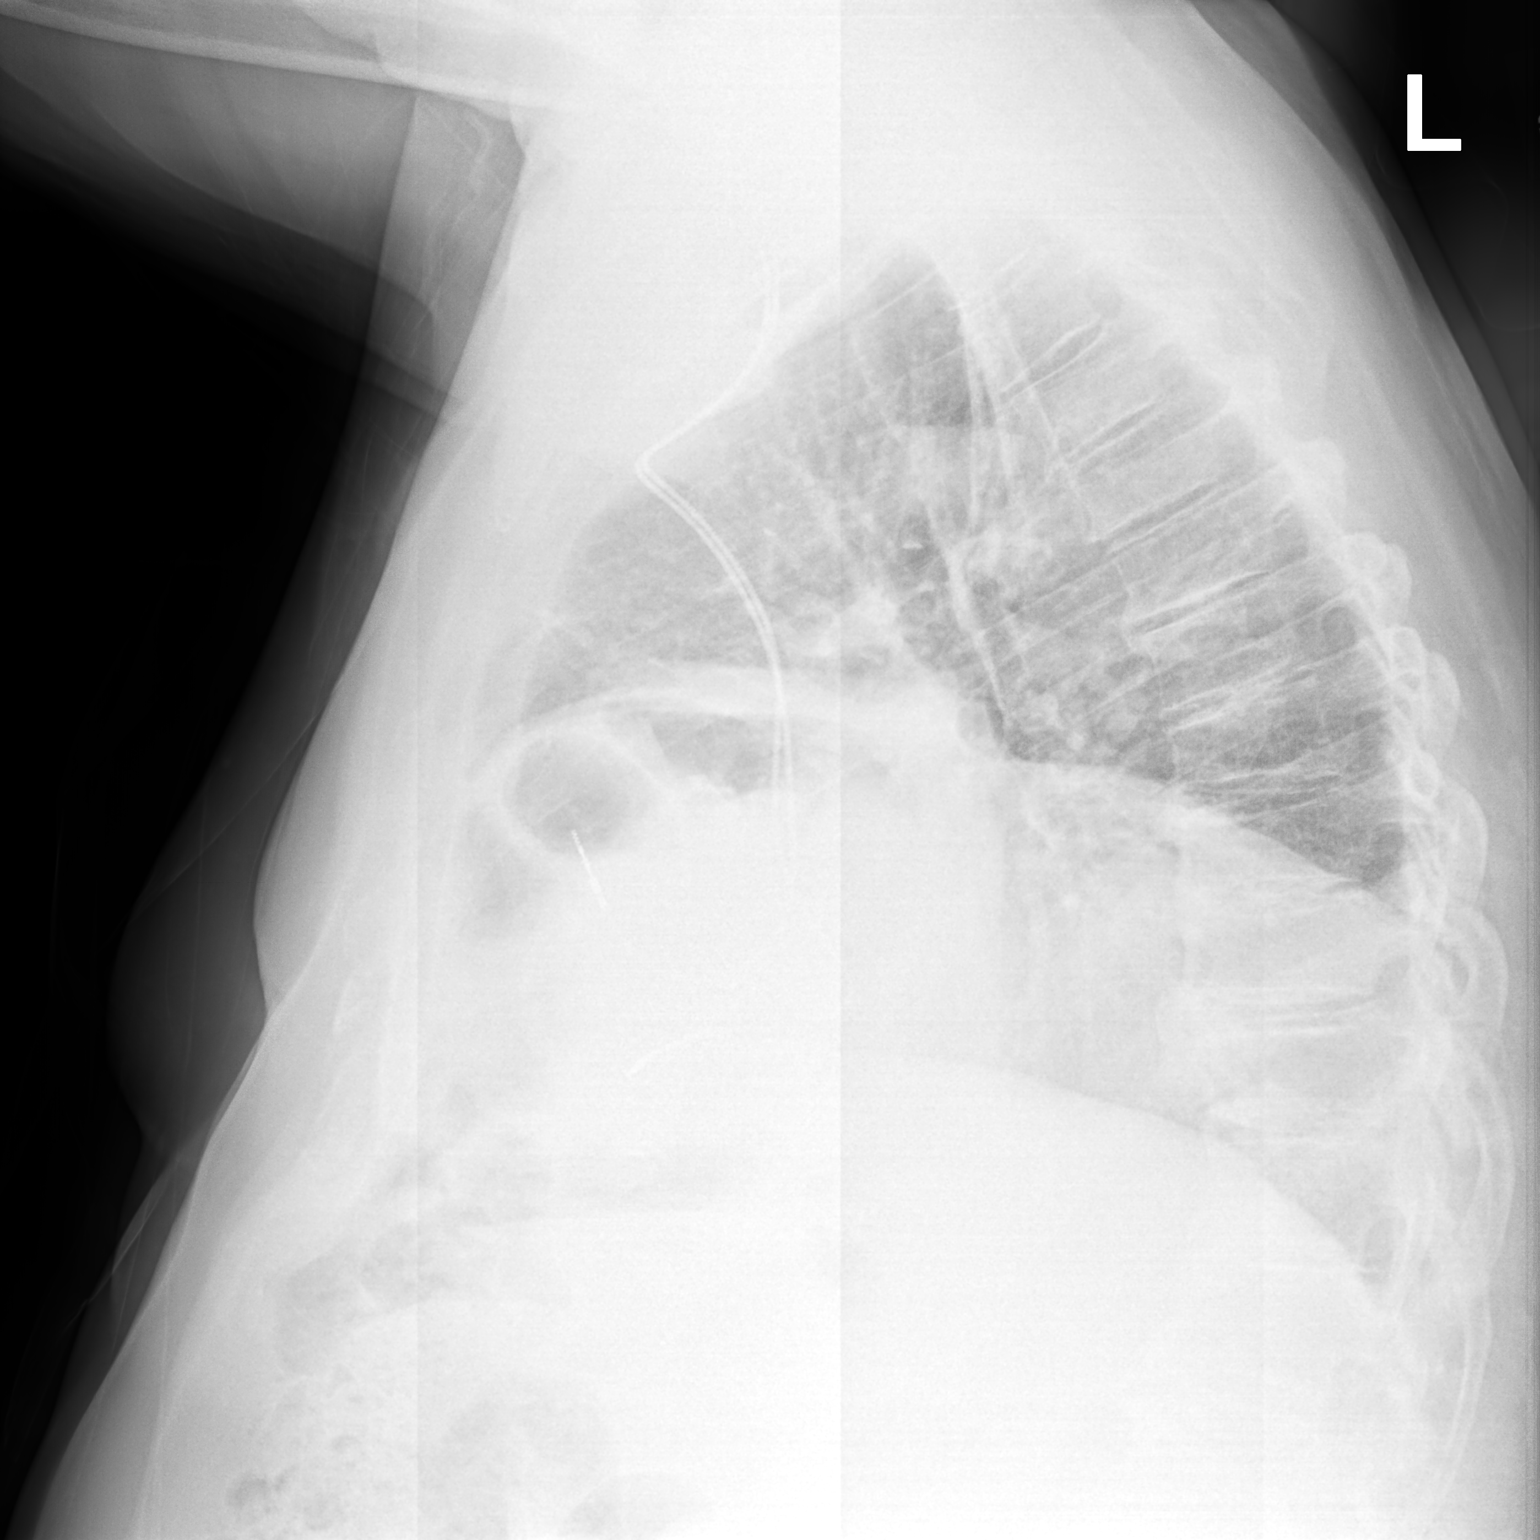

[2 of 2 positions shown; findings below may reference images not displayed]

FINDINGS: Gas under the right hemidiaphragm shown on prior x-ray and CT
represent gas within right hepatic flexure of the colon. Stable
chronic elevation of the right hemidiaphragm. Heart is mildly
enlarged. Left pacer is unchanged. No confluent opacities, effusions
or edema. No acute bony abnormality.
IMPRESSION: Stable chronic elevation of the right hemidiaphragm.

Mild cardiomegaly.

No active disease.

## 2020-12-07 ENCOUNTER — Encounter: Payer: Self-pay | Admitting: Family Medicine

## 2020-12-09 ENCOUNTER — Ambulatory Visit (INDEPENDENT_AMBULATORY_CARE_PROVIDER_SITE_OTHER): Payer: Medicare Other

## 2020-12-09 ENCOUNTER — Other Ambulatory Visit: Payer: Self-pay

## 2020-12-09 ENCOUNTER — Encounter: Payer: Self-pay | Admitting: *Deleted

## 2020-12-09 ENCOUNTER — Ambulatory Visit (INDEPENDENT_AMBULATORY_CARE_PROVIDER_SITE_OTHER): Payer: Medicare Other | Admitting: Primary Care

## 2020-12-09 ENCOUNTER — Encounter: Payer: Self-pay | Admitting: Primary Care

## 2020-12-09 VITALS — BP 112/70 | HR 88 | Temp 97.7°F | Ht 75.0 in | Wt 245.8 lb

## 2020-12-09 DIAGNOSIS — R0602 Shortness of breath: Secondary | ICD-10-CM | POA: Diagnosis not present

## 2020-12-09 DIAGNOSIS — I25119 Atherosclerotic heart disease of native coronary artery with unspecified angina pectoris: Secondary | ICD-10-CM | POA: Diagnosis not present

## 2020-12-09 LAB — BRAIN NATRIURETIC PEPTIDE: Pro B Natriuretic peptide (BNP): 94 pg/mL (ref 0.0–100.0)

## 2020-12-09 LAB — CBC WITH DIFFERENTIAL/PLATELET
Basophils Absolute: 0 10*3/uL (ref 0.0–0.1)
Basophils Relative: 0.4 % (ref 0.0–3.0)
Eosinophils Absolute: 0.3 10*3/uL (ref 0.0–0.7)
Eosinophils Relative: 4.1 % (ref 0.0–5.0)
HCT: 39.9 % (ref 39.0–52.0)
Hemoglobin: 13.8 g/dL (ref 13.0–17.0)
Lymphocytes Relative: 28.3 % (ref 12.0–46.0)
Lymphs Abs: 2.3 10*3/uL (ref 0.7–4.0)
MCHC: 34.5 g/dL (ref 30.0–36.0)
MCV: 93.8 fl (ref 78.0–100.0)
Monocytes Absolute: 0.7 10*3/uL (ref 0.1–1.0)
Monocytes Relative: 9 % (ref 3.0–12.0)
Neutro Abs: 4.6 10*3/uL (ref 1.4–7.7)
Neutrophils Relative %: 58.2 % (ref 43.0–77.0)
Platelets: 170 10*3/uL (ref 150.0–400.0)
RBC: 4.26 Mil/uL (ref 4.22–5.81)
RDW: 13.9 % (ref 11.5–15.5)
WBC: 8 10*3/uL (ref 4.0–10.5)

## 2020-12-09 NOTE — Patient Instructions (Signed)
Nice meeting you today [name]  I do not think your current shortness of breath symptoms are related to your asthma. We will check chest x-ray and labs today.  If cardiology clears you we may consider starting a maintenance inhaler for your asthma that has a steroid and long-acting bronchodilator in it.  Orders Chest x-ray and labs today  Follow-up 3 months with Dr. Lamonte Sakai Or return after seeing cardiology if breathing symptoms do not improve

## 2020-12-09 NOTE — Assessment & Plan Note (Addendum)
-   Persistent dyspnea with moderate exertion. Pulmonary function testing in August 2021 showed mixed obstructive/restrictive lung disease.  No overt clinical signs of asthma today in office. No improvement with prn Albuterol.  Denies chest tightness or wheezing. I do not feel his current dyspnea is related to underlying asthma. We will check CXR and labs including BNP. I will recommend he discuss his symptoms with cardiology. Echocardiogram in the past has shown grade 1 DD and he has LE edema. May benefit from addition of diuretic.   Addendum: CXR showed no evidence of pleural effusion (fluid) or pneumonia. Persistent elevated right hemidiaphragm. Aortic atherosclerosis. BNP was within normal limits. If cardiology does not feel dyspnea is d/t aortic stenosis or diastolic dysfunction I recommend therapeutic trial low dose ICS/LABA inhaler

## 2020-12-09 NOTE — Progress Notes (Signed)
@Patient  ID: [name], male    DOB: 25-Oct-1933, 87 y.o.   MRN: [id number]  Chief Complaint  Patient presents with  . Follow-up    Still having shortness of breath.     Referring provider: Marin Olp, MD  HPI: 87 year old male, never smoked.  Past medical history significant for hypertension, coronary artery disease status post stent placement, chronic pulmonary embolism, acute a negative for PE chronic, chronic kidney disease stage III, hyperlipidemia.  Patient of Dr. Lamonte Sakai, last seen in office on 03/29/2020 for shortness of breath.  Dyspnea felt to be multifactorial due to known aortic stenosis and coronary artery disease, elevated right hemidiaphragm and history of PE on Eliquis.  Probable mild asthma, given trial albuterol.  And to follow-up in 6 months.  12/09/2020 Patient presents today for hospital follow-up. He was hospitalized at [facility] from 11/13/20-11/15/20 for acute hypoxic respiratory failure. Patient presented to emergency room with acute dyspnea and shortness of breath. Patient originally became short of breath while driving his wife to an appointment.  Associated nausea and fever 100.6.  Found to be hypoxemic by EMS at 80%, patient was placed on supplemental oxygen.  He had a brief episode of AMS in route but returned to baseline prior to arrival.  CTA negative for PE, right lower lobe atelectasis.  Chest x-ray without discrete consolidation, elevated hemidiaphragm.  proBNP 405, COVID-negative.  Last echo June 2021 showed EF 65% with grade 1 diastolic dysfunction. WBC 13.  Held off on additional antibiotics given nontoxic appearance.  Patient continues Eliquis for history of DVT/PE. Dr. Yong Channel managing left lower extremity infection. He just finished course of keflex. +2 left lower extremity   He still has some shortness of breath, this has been going on for two years. He has to stop to rest after walking up stairs. He does not experience dyspnea while  riding his recumbent bike. He has a intermittent dry cough. Mainly when lying down at night after taking pills. Pulmonary function testing in August 2021 showed mixed obstructive/restrictive lung disease.  He did not notice any improvement with albuterol inhaler. Denies chest tightness or wheezing.    PFT  03/29/2020- FVC 2.  3 7 (56%), FEV1 1.86 (62%), ratio 79, +BD Mixed restrictive/obstructive lung disease.  Obstruction confirmed by significant improvement in airflow after bronchodilator. Decreased diffusion capacity that corrects to normal range when adjusted to alveolar volume.   Allergies  Allergen Reactions  . Epinephrine Palpitations and Other (See Comments)    "Heart rate races"  . Lisinopril Cough  . Penicillins Hives    Has patient had a PCN reaction causing immediate rash, facial/tongue/throat swelling, SOB or lightheadedness with hypotension: No Has patient had a PCN reaction causing severe rash involving mucus membranes or skin necrosis: no Has patient had a PCN reaction that required hospitalization: No Has patient had a PCN reaction occurring within the last 10 years: No If all of the above answers are "NO", then may proceed with Cephalosporin use.   . Novocain [Procaine] Palpitations and Other (See Comments)    Makes the heart rate    Immunization History  Administered Date(s) Administered  . Influenza Whole 04/25/2009, 06/21/2010, 07/01/2011  . Influenza, High Dose Seasonal PF 04/22/2014, 06/10/2017, 05/06/2018, 05/12/2020  . Influenza,inj,Quad PF,6+ Mos 06/17/2013  . Influenza-Unspecified 06/20/2000, 05/13/2002, 06/08/2003, 06/06/2004, 07/13/2006, 04/25/2011, 06/17/2013, 04/22/2014, 05/22/2016, 06/10/2017, 05/01/2019  . Moderna Sars-Covid-2 Vaccination 08/19/2019, 09/16/2019, 04/08/2020  . Pneumococcal Conjugate-13 12/28/2013  . Pneumococcal Polysaccharide-23 10/22/2007  . Pneumococcal-Unspecified 04/07/2001  .  Td 12/05/2009    Past Medical History:  Diagnosis  Date  . Arthritis    "back, knees" (11/21/2016)  . Bladder stones   . CAD (coronary artery disease)    11/20/16 PCI with DES--> LAD/RCA  . Chronic lower back pain   . CKD (chronic kidney disease), stage IV ([place])    [name] 11/08/2016  . History of gout   . History of kidney stones 12/02/2008  . Hyperlipidemia   . Hypertension   . Myocardial infarction [facility])    "silent" (11/21/2016)  . PSA, INCREASED 12/05/2009  . UTI (urinary tract infection)     Tobacco History: Social History   Tobacco Use  Smoking Status Never Smoker  Smokeless Tobacco Never Used   Counseling given: Not Answered   Outpatient Medications Prior to Visit  Medication Sig Dispense Refill  . allopurinol (ZYLOPRIM) 300 MG tablet TAKE 1 TABLET DAILY 90 tablet 3  . apixaban (ELIQUIS) 5 MG TABS tablet Take 1 tablet (5 mg total) by mouth 2 (two) times daily. 180 tablet 3  . atorvastatin (LIPITOR) 40 MG tablet TAKE 1 TABLET DAILY 90 tablet 3  . diphenhydramine-acetaminophen (TYLENOL PM) 25-500 MG TABS tablet Take 1 tablet by mouth at bedtime as needed (sleep).    . finasteride (PROSCAR) 5 MG tablet Take 5 mg by mouth daily.    . metoprolol tartrate (LOPRESSOR) 25 MG tablet Take 1 tablet (25 mg total) by mouth 2 (two) times daily. 90 tablet 3  . ranolazine (RANEXA) 1000 MG SR tablet Take 1 tablet (1,000 mg total) by mouth 2 (two) times daily. 60 tablet 0  . Tamsulosin HCl (FLOMAX) 0.4 MG CAPS Take 0.4 mg by mouth at bedtime.     . nitroGLYCERIN (NITROSTAT) 0.4 MG SL tablet Place 1 tablet (0.4 mg total) under the tongue every 5 (five) minutes as needed for chest pain. 25 tablet 11   No facility-administered medications prior to visit.   Review of Systems  Review of Systems  Constitutional: Negative.   HENT: Negative.   Respiratory: Positive for shortness of breath. Negative for cough, chest tightness and wheezing.   Cardiovascular: Positive for leg swelling.   Physical Exam  BP 112/70 (BP Location: Left Arm, Cuff  Size: Normal)   Pulse 88   Temp 97.7 F (36.5 C) (Temporal)   Ht 6\' 3"  (1.905 m)   Wt 245 lb 12.8 oz (111.5 kg)   SpO2 98% Comment: RA  BMI 30.72 kg/m  Physical Exam Constitutional:      General: He is not in acute distress.    Appearance: Normal appearance. He is not ill-appearing.  HENT:     Mouth/Throat:     Mouth: Mucous membranes are moist.     Pharynx: Oropharynx is clear.  Cardiovascular:     Rate and Rhythm: Normal rate and regular rhythm.     Heart sounds: Murmur heard.      Comments: + 3 LLE edema  Pulmonary:     Effort: Pulmonary effort is normal.     Breath sounds: Normal breath sounds. No wheezing, rhonchi or rales.  Musculoskeletal:        General: Normal range of motion.  Skin:    General: Skin is warm and dry.     Comments: No skin breakdown or open areas to LLE   Neurological:     General: No focal deficit present.     Mental Status: He is alert and oriented to person, place, and time. Mental status is at baseline.  Psychiatric:        Mood and Affect: Mood normal.        Behavior: Behavior normal.        Thought Content: Thought content normal.        Judgment: Judgment normal.      Lab Results:  CBC    Component Value Date/Time   WBC 8.0 12/09/2020 1452   RBC 4.26 12/09/2020 1452   HGB 13.8 12/09/2020 1452   HGB 15.4 05/18/2019 1434   HCT 39.9 12/09/2020 1452   HCT 45.2 05/18/2019 1434   PLT 170.0 12/09/2020 1452   PLT 168 05/18/2019 1434   MCV 93.8 12/09/2020 1452   MCV 92 05/18/2019 1434   MCH 30.9 07/04/2019 0225   MCHC 34.5 12/09/2020 1452   RDW 13.9 12/09/2020 1452   RDW 13.2 05/18/2019 1434   LYMPHSABS 2.3 12/09/2020 1452   LYMPHSABS 2.5 05/18/2019 1434   MONOABS 0.7 12/09/2020 1452   EOSABS 0.3 12/09/2020 1452   EOSABS 0.4 05/18/2019 1434   BASOSABS 0.0 12/09/2020 1452   BASOSABS 0.0 05/18/2019 1434    BMET    Component Value Date/Time   NA 140 11/30/2020 1215   NA 139 06/02/2019 1208   K 4.8 11/30/2020 1215   CL 105  11/30/2020 1215   CO2 29 11/30/2020 1215   GLUCOSE 92 11/30/2020 1215   BUN 34 (H) 11/30/2020 1215   BUN 36 (H) 06/02/2019 1208   CREATININE 1.68 (H) 11/30/2020 1215   CALCIUM 9.6 11/30/2020 1215   GFRNONAA 22 (L) 07/04/2019 0225   GFRAA 26 (L) 07/04/2019 0225    BNP No results found for: BNP  ProBNP    Component Value Date/Time   PROBNP 94.0 12/09/2020 1452    Imaging: DG Chest 2 View  Result Date: 12/09/2020 CLINICAL DATA:  Shortness of breath EXAM: CHEST - 2 VIEW COMPARISON:  December 08, 2019 FINDINGS: There is persistent elevation of the right hemidiaphragm. There is colonic interposition between the right hemidiaphragm in aorta, a stable finding. There is no edema or airspace opacity. The heart size and pulmonary vascular normal. Pacemaker leads are attached to the right atrium and right ventricle. There is aortic atherosclerosis. No adenopathy. There is degenerative change in the thoracic spine. IMPRESSION: Persistent elevation of the right hemidiaphragm. No edema or airspace opacity. Stable cardiac silhouette. Pacemaker leads attached to right atrium and right ventricle. Aortic Atherosclerosis (ICD10-I70.0). Electronically Signed   By: Lowella Grip III M.D.   On: 12/09/2020 15:15     Assessment & Plan:   Dyspnea - Persistent dyspnea with moderate exertion. Pulmonary function testing in August 2021 showed mixed obstructive/restrictive lung disease.  No overt clinical signs of asthma today in office. No improvement with prn Albuterol.  Denies chest tightness or wheezing. I do not feel his current dyspnea is related to underlying asthma. We will check CXR and labs including BNP. I will recommend he discuss his symptoms with cardiology. Echocardiogram in the past has shown grade 1 DD and he has LE edema. May benefit from addition of diuretic.   Addendum: CXR showed no evidence of pleural effusion (fluid) or pneumonia. Persistent elevated right hemidiaphragm. Aortic  atherosclerosis. BNP was within normal limits. If cardiology does not feel dyspnea is d/t aortic stenosis or diastolic dysfunction I recommend therapeutic trial low dose ICS/LABA inhaler     Follow-up 3 months with Dr. Lamonte Sakai or return sooner after seeing cardiology if breathing symptoms do not improve   Martyn Ehrich, NP 12/12/2020

## 2020-12-09 NOTE — Progress Notes (Signed)
Please let patient know CXR, BNP and CBC were normal.

## 2020-12-09 NOTE — Progress Notes (Signed)
Please let patient know CXR showed no evidence of pleural effusion (fluid) or pneumonia. Persistent elevated right hemidiaphragm. Aortic atherosclerosis.

## 2020-12-12 ENCOUNTER — Other Ambulatory Visit: Payer: Self-pay

## 2020-12-12 ENCOUNTER — Ambulatory Visit (INDEPENDENT_AMBULATORY_CARE_PROVIDER_SITE_OTHER): Payer: Medicare Other | Admitting: Cardiology

## 2020-12-12 ENCOUNTER — Encounter: Payer: Self-pay | Admitting: Cardiology

## 2020-12-12 VITALS — BP 100/60 | HR 74 | Ht 75.0 in | Wt 245.0 lb

## 2020-12-12 DIAGNOSIS — I25119 Atherosclerotic heart disease of native coronary artery with unspecified angina pectoris: Secondary | ICD-10-CM | POA: Diagnosis not present

## 2020-12-12 DIAGNOSIS — Z79899 Other long term (current) drug therapy: Secondary | ICD-10-CM | POA: Diagnosis not present

## 2020-12-12 DIAGNOSIS — I495 Sick sinus syndrome: Secondary | ICD-10-CM | POA: Diagnosis not present

## 2020-12-12 DIAGNOSIS — I10 Essential (primary) hypertension: Secondary | ICD-10-CM | POA: Diagnosis not present

## 2020-12-12 DIAGNOSIS — I350 Nonrheumatic aortic (valve) stenosis: Secondary | ICD-10-CM

## 2020-12-12 DIAGNOSIS — Z950 Presence of cardiac pacemaker: Secondary | ICD-10-CM

## 2020-12-12 DIAGNOSIS — I824Z2 Acute embolism and thrombosis of unspecified deep veins of left distal lower extremity: Secondary | ICD-10-CM

## 2020-12-12 MED ORDER — NITROGLYCERIN 0.4 MG SL SUBL: 0.4000 mg | SUBLINGUAL_TABLET | SUBLINGUAL | 11 refills | Status: AC | PRN

## 2020-12-12 MED ORDER — FUROSEMIDE 20 MG PO TABS: 20.0000 mg | ORAL_TABLET | Freq: Every day | ORAL | 3 refills | Status: DC

## 2020-12-12 NOTE — Patient Instructions (Signed)
Medication Instructions:  Please start Furosemide 20 mg a day. Continue all other medications as listed.  *If you need a refill on your cardiac medications before your next appointment, please call your pharmacy*  Lab Work: Please have blood work in 1 month (BMP)  If you have labs (blood work) drawn today and your tests are completely normal, you will receive your results only by: [name] MyChart Message (if you have MyChart) OR . A paper copy in the mail If you have any lab test that is abnormal or we need to change your treatment, we will call you to review the results.  Follow-Up: At [facility], you and your health needs are our priority.  As part of our continuing mission to provide you with exceptional heart care, we have created designated Provider Care Teams.  These Care Teams include your primary Cardiologist (physician) and Advanced Practice Providers (APPs -  Physician Assistants and Nurse Practitioners) who all work together to provide you with the care you need, when you need it.  We recommend signing up for the patient portal called "MyChart".  Sign up information is provided on this After Visit Summary.  MyChart is used to connect with patients for Virtual Visits (Telemedicine).  Patients are able to view lab/test results, encounter notes, upcoming appointments, etc.  Non-urgent messages can be sent to your provider as well.   To learn more about what you can do with MyChart, go to [email or web address].    Your next appointment:   6 month(s)  The format for your next appointment:   In Person  Provider:   [name], MD   Thank you for choosing [facility]!!

## 2020-12-12 NOTE — Progress Notes (Signed)
Cardiology Office Note:    Date:  12/12/2020   ID:  [name], DOB 1934/08/12, MRN [id number]  PCP:  Marin Olp, MD   [place]  Cardiologist:  [name], MD  Advanced Practice Provider:  No care team member to display Electrophysiologist:  Will Meredith Leeds, MD       Referring MD: Marin Olp, MD     History of Present Illness:    [name] is a 87 y.o. male here for follow-up of coronary artery disease prior PCI with DES to the RCA, LAD in 2018, CKD 4, hypertension hyperlipidemia sick sinus syndrome with pacemaker, dilated aortic root prior sustained ventricular tachycardia, PE, DVT.  Previously seen by Dr. Curt Bears.  We have been monitoring his aortic root via echocardiogram annually.  Also had pulmonary evaluation as well as showed mixed obstructive and restrictive disease.  Elevated right hemidiaphragm noted.  His pacemaker was implanted in 2019.  Medtronic.  Echocardiogram 01/20/2020 showed EF of 60 to 65% with moderate aortic valve stenosis 20 mmHg.  He had an admission to the hospital in November 13, 2020 with hypoxia at [facility].  In review of records and review of Dr. Ansel Bong report, they found a DVT in his leg.  He was placed on Eliquis.  Considering hematology consult.  Patient did have a CT angiogram that was negative for PE moderate aortic stenosis once again seen on echocardiogram at [facility] in April 2022.  Both legs feel same in morning than filled right side.   Past Medical History:  Diagnosis Date  . Arthritis    "back, knees" (11/21/2016)  . Bladder stones   . CAD (coronary artery disease)    11/20/16 PCI with DES--> LAD/RCA  . Chronic lower back pain   . CKD (chronic kidney disease), stage IV ([place])    [name] 11/08/2016  . History of gout   . History of kidney stones 12/02/2008  . Hyperlipidemia   . Hypertension   . Myocardial infarction [facility])    "silent" (11/21/2016)  . PSA, INCREASED 12/05/2009  . UTI (urinary  tract infection)     Past Surgical History:  Procedure Laterality Date  . CARDIAC CATHETERIZATION  11/14/2016  . CATARACT EXTRACTION W/ INTRAOCULAR LENS  IMPLANT, BILATERAL    . CORONARY ANGIOPLASTY WITH STENT PLACEMENT  11/21/2016   "2 stents"   . CORONARY ATHERECTOMY N/A 11/21/2016   Procedure: Coronary Atherectomy;  Surgeon: Leonie Man, MD;  Location: [place] [facility];  Service: Cardiovascular;  Laterality: N/A;  RCA and LAD  . CORONARY STENT INTERVENTION N/A 11/21/2016   Procedure: Coronary Stent Intervention;  Surgeon: Leonie Man, MD;  Location: [place] [facility];  Service: Cardiovascular;  Laterality: N/A;  RCA and LAD  . CYSTOSCOPY W/ STONE MANIPULATION    . INTRAVASCULAR PRESSURE WIRE/FFR STUDY N/A 05/22/2019   Procedure: INTRAVASCULAR PRESSURE WIRE/FFR STUDY;  Surgeon: Nelva Bush, MD;  Location: [place] [facility];  Service: Cardiovascular;  Laterality: N/A;  . JOINT REPLACEMENT    . KNEE ARTHROSCOPY Left   . LAPAROSCOPIC CHOLECYSTECTOMY    . LEFT HEART CATH N/A 11/21/2016   Procedure: Left Heart Cath;  Surgeon: Leonie Man, MD;  Location: [place] [facility];  Service: Cardiovascular;  Laterality: N/A;  . LITHOTRIPSY    . PILONIDAL CYST EXCISION  58  . RIGHT/LEFT HEART CATH AND CORONARY ANGIOGRAPHY N/A 11/14/2016   Procedure: Right/Left Heart Cath and Coronary Angiography;  Surgeon: Leonie Man,  MD;  Location: [place] [facility];  Service: Cardiovascular;  Laterality: N/A;  . RIGHT/LEFT HEART CATH AND CORONARY ANGIOGRAPHY N/A 05/22/2019   Procedure: RIGHT/LEFT HEART CATH AND CORONARY ANGIOGRAPHY;  Surgeon: Nelva Bush, MD;  Location: [place] [facility];  Service: Cardiovascular;  Laterality: N/A;  . TEMPORARY PACEMAKER N/A 11/21/2016   Procedure: Temporary Pacemaker;  Surgeon: Leonie Man, MD;  Location: [place] [facility];  Service: Cardiovascular;  Laterality: N/A;  . TONSILLECTOMY    . TOTAL KNEE ARTHROPLASTY Left 09/22/2012   Procedure: TOTAL  KNEE ARTHROPLASTY;  Surgeon: Lorn Junes, MD;  Location: [place];  Service: Orthopedics;  Laterality: Left;    Current Medications: Current Meds  Medication Sig  . allopurinol (ZYLOPRIM) 300 MG tablet TAKE 1 TABLET DAILY  . apixaban (ELIQUIS) 5 MG TABS tablet Take 1 tablet (5 mg total) by mouth 2 (two) times daily.  [name] atorvastatin (LIPITOR) 40 MG tablet TAKE 1 TABLET DAILY  . diphenhydramine-acetaminophen (TYLENOL PM) 25-500 MG TABS tablet Take 1 tablet by mouth at bedtime as needed (sleep).  . finasteride (PROSCAR) 5 MG tablet Take 5 mg by mouth daily.  . furosemide (LASIX) 20 MG tablet Take 1 tablet (20 mg total) by mouth daily.  . metoprolol tartrate (LOPRESSOR) 25 MG tablet Take 1 tablet (25 mg total) by mouth 2 (two) times daily.  . ranolazine (RANEXA) 1000 MG SR tablet Take 1 tablet (1,000 mg total) by mouth 2 (two) times daily.  . Tamsulosin HCl (FLOMAX) 0.4 MG CAPS Take 0.4 mg by mouth at bedtime.   . [DISCONTINUED] nitroGLYCERIN (NITROSTAT) 0.4 MG SL tablet Place 1 tablet (0.4 mg total) under the tongue every 5 (five) minutes as needed for chest pain.     Allergies:   Epinephrine, Lisinopril, Penicillins, and Novocain [procaine]   Social History   Socioeconomic History  . Marital status: Married    Spouse name: Not on file  . Number of children: Not on file  . Years of education: Not on file  . Highest education level: Not on file  Occupational History  . Not on file  Tobacco Use  . Smoking status: Never Smoker  . Smokeless tobacco: Never Used  Vaping Use  . Vaping Use: Never used  Substance and Sexual Activity  . Alcohol use: Not Currently    Comment: 11/21/2016 "nothing since 2015"  . Drug use: No  . Sexual activity: Yes    Partners: Female  Other Topics Concern  . Not on file  Social History Narrative   Married (Wife June patient of Dr. Yong Channel). 2 sons. 4 grandkids.    Originally from near [place], [place] at [employer] 6[place]7 months of  the year      Retired from [employer] long time. 27 years of service.       Hobbies: golf   Used to [employer], run [employer]   Social Determinants of Radio broadcast assistant Strain: Not on [employer] Insecurity: Not on file  Transportation Needs: Not on file  Physical Activity: Not on file  Stress: Not on file  Social Connections: Not on file     Family History: The patient's family history includes Breast cancer in his mother; Heart attack in his father.  ROS:   Please see the history of present illness.     All other systems reviewed and are negative.  EKGs/Labs/Other Studies Reviewed:    The following studies were reviewed today:    05/22/2019:  [facility] Conclusions: 1. Significant single-vessel coronary artery disease with 60% in-stent restenosis of proximal RCA stent that is mildly abnormal by hemodynamic assessment (DFR 0.89; abnormal if less than 0.90). 2. Mild to moderate, non-obstructive coronary artery disease involving the LAD and LCx; LAD stent is widely patent. 3. Mildly elevated left heart, right heart, and pulmonary artery pressures. 4. Mild to moderate aortic valve stenosis. 5. Mildly reduced Fick cardiac output/index.  Recommendations: 1. Optimize medical therapy, including aggressive antianginal therapy. We will add isosorbide mononitrate 30 mg daily, to be escalated as tolerated. 2. If symptoms persist/worsen despite maximal tolerated doses of at least two antianginal agents, PCI to proximal RCA in-stent restenosis will need to be considered. 3. Aggressive secondary prevention. 4. Restart apixaban tomorrow morning if no evidence of bleeding or vascular injury at catheterization sites.    Recent Labs: 11/30/2020: ALT 19; BUN 34; Creatinine, Ser 1.68; Potassium 4.8; Sodium 140 12/09/2020: Hemoglobin 13.8; Platelets 170.0; Pro B Natriuretic peptide (BNP) 94.0  Recent Lipid Panel    Component Value Date/Time   CHOL 122 11/30/2020 1215   TRIG 106.0  11/30/2020 1215   HDL 42.20 11/30/2020 1215   CHOLHDL 3 11/30/2020 1215   VLDL 21.2 11/30/2020 1215   LDLCALC 58 11/30/2020 1215   LDLDIRECT 79.0 12/08/2014 0905     Risk Assessment/Calculations:      Physical Exam:    VS:  BP 100/60 (BP Location: Left Arm, Patient Position: Sitting, Cuff Size: Normal)   Pulse 74   Ht 6\' 3"  (1.905 m)   Wt 245 lb (111.1 kg)   SpO2 95%   BMI 30.62 kg/m     Wt Readings from Last 3 Encounters:  12/12/20 245 lb (111.1 kg)  12/09/20 245 lb 12.8 oz (111.5 kg)  11/30/20 248 lb (112.5 kg)     GEN:  Well nourished, well developed in no acute distress HEENT: Normal NECK: No JVD; No carotid bruits LYMPHATICS: No lymphadenopathy CARDIAC: RRR, 2/6 SM, no rubs, gallops RESPIRATORY:  Clear to auscultation without rales, wheezing or rhonchi  ABDOMEN: Soft, non-tender, non-distended MUSCULOSKELETAL:  Left edema; No deformity  SKIN: Warm and dry NEUROLOGIC:  Alert and oriented x 3 PSYCHIATRIC:  Normal affect   ASSESSMENT:    1. Moderate aortic stenosis   2. Primary hypertension   3. Medication management   4. Sick sinus syndrome (HCC)   5. Cardiac pacemaker in situ   6. Acute deep vein thrombosis (DVT) of distal vein of left lower extremity (HCC)    PLAN:    In order of problems listed above:  Coronary artery disease - Taking Ranexa beta-blocker statin no significant ischemic symptoms.  Stable.  Dyspnea on exertion - Overall EF is normal, has moderate aortic stenosis.  It is reasonable to trial furosemide 20 mg a day.  He is not experiencing any nocturia currently.  Need to watch creatinine.  We will check blood work when he is back in town basic metabolic profile.  He has not been experiencing any dizziness or syncope.  Does have an elevated right hemidiaphragm.  Has seen pulmonary.  Permanent pacemaker - Working well, Medtronic.  Dr. Curt Bears and Debroah Baller have been monitoring.   Valvular heart disease with moderate aortic stenosis - Aortic  root size is being monitored as well.  Stable.  Continue to monitor echocardiogram next year.  Nonsustained ventricular tachycardia - None recently.  6 mth follow up   Medication Adjustments/Labs and Tests Ordered: Current medicines are reviewed at length with the patient today.  Concerns regarding medicines are outlined above.  Orders Placed This Encounter  Procedures  . Basic metabolic panel   Meds ordered this encounter  Medications  . furosemide (LASIX) 20 MG tablet    Sig: Take 1 tablet (20 mg total) by mouth daily.    Dispense:  90 tablet    Refill:  3    Patient Instructions  Medication Instructions:  Please start Furosemide 20 mg a day. Continue all other medications as listed.  *If you need a refill on your cardiac medications before your next appointment, please call your pharmacy*  Lab Work: Please have blood work in 1 month (BMP)  If you have labs (blood work) drawn today and your tests are completely normal, you will receive your results only by: [name] MyChart Message (if you have MyChart) OR . A paper copy in the mail If you have any lab test that is abnormal or we need to change your treatment, we will call you to review the results.  Follow-Up: At [facility], you and your health needs are our priority.  As part of our continuing mission to provide you with exceptional heart care, we have created designated Provider Care Teams.  These Care Teams include your primary Cardiologist (physician) and Advanced Practice Providers (APPs -  Physician Assistants and Nurse Practitioners) who all work together to provide you with the care you need, when you need it.  We recommend signing up for the patient portal called "MyChart".  Sign up information is provided on this After Visit Summary.  MyChart is used to connect with patients for Virtual Visits (Telemedicine).  Patients are able to view lab/test results, encounter notes, upcoming appointments, etc.  Non-urgent  messages can be sent to your provider as well.   To learn more about what you can do with MyChart, go to [email or web address].    Your next appointment:   6 month(s)  The format for your next appointment:   In Person  Provider:   [name], MD   Thank you for choosing [facility]!!        Signed, [name], MD  12/12/2020 10:55 AM    [place]

## 2020-12-23 ENCOUNTER — Inpatient Hospital Stay: Payer: Medicare Other | Admitting: Emergency Medicine

## 2021-01-04 ENCOUNTER — Ambulatory Visit (INDEPENDENT_AMBULATORY_CARE_PROVIDER_SITE_OTHER): Payer: Medicare Other

## 2021-01-04 DIAGNOSIS — I495 Sick sinus syndrome: Secondary | ICD-10-CM | POA: Diagnosis not present

## 2021-01-07 LAB — CUP PACEART REMOTE DEVICE CHECK
Battery Remaining Longevity: 113 mo
Battery Voltage: 2.94 V
Brady Statistic AP VP Percent: 8.81 %
Brady Statistic AP VS Percent: 89.12 %
Brady Statistic AS VP Percent: 0.02 %
Brady Statistic AS VS Percent: 2.05 %
Brady Statistic RA Percent Paced: 98.32 %
Brady Statistic RV Percent Paced: 8.82 %
Date Time Interrogation Session: 20220528005205
Implantable Lead Implant Date: 20190116
Implantable Lead Implant Date: 20190116
Implantable Lead Location: 753859
Implantable Lead Location: 753860
Implantable Lead Model: 5076
Implantable Lead Model: 5076
Implantable Pulse Generator Implant Date: 20190116
Lead Channel Impedance Value: 285 Ohm
Lead Channel Impedance Value: 304 Ohm
Lead Channel Impedance Value: 342 Ohm
Lead Channel Impedance Value: 494 Ohm
Lead Channel Pacing Threshold Amplitude: 0.875 V
Lead Channel Pacing Threshold Amplitude: 1.125 V
Lead Channel Pacing Threshold Pulse Width: 0.4 ms
Lead Channel Pacing Threshold Pulse Width: 0.4 ms
Lead Channel Sensing Intrinsic Amplitude: 0.5 mV
Lead Channel Sensing Intrinsic Amplitude: 0.5 mV
Lead Channel Sensing Intrinsic Amplitude: 6.25 mV
Lead Channel Sensing Intrinsic Amplitude: 6.25 mV
Lead Channel Setting Pacing Amplitude: 2 V
Lead Channel Setting Pacing Amplitude: 2.25 V
Lead Channel Setting Pacing Pulse Width: 0.4 ms
Lead Channel Setting Sensing Sensitivity: 1.2 mV

## 2021-01-24 ENCOUNTER — Other Ambulatory Visit: Payer: Medicare Other | Admitting: *Deleted

## 2021-01-24 ENCOUNTER — Other Ambulatory Visit: Payer: Self-pay

## 2021-01-24 DIAGNOSIS — Z79899 Other long term (current) drug therapy: Secondary | ICD-10-CM | POA: Diagnosis not present

## 2021-01-24 DIAGNOSIS — I10 Essential (primary) hypertension: Secondary | ICD-10-CM | POA: Diagnosis not present

## 2021-01-24 LAB — BASIC METABOLIC PANEL
BUN/Creatinine Ratio: 20 (ref 10–24)
BUN: 34 mg/dL — ABNORMAL HIGH (ref 8–27)
CO2: 25 mmol/L (ref 20–29)
Calcium: 9.4 mg/dL (ref 8.6–10.2)
Chloride: 102 mmol/L (ref 96–106)
Creatinine, Ser: 1.73 mg/dL — ABNORMAL HIGH (ref 0.76–1.27)
Glucose: 79 mg/dL (ref 65–99)
Potassium: 4.4 mmol/L (ref 3.5–5.2)
Sodium: 140 mmol/L (ref 134–144)
eGFR: 38 mL/min/{1.73_m2} — ABNORMAL LOW (ref >59)

## 2021-01-26 NOTE — Progress Notes (Signed)
Remote pacemaker transmission.   

## 2021-02-20 MED ORDER — METOPROLOL TARTRATE 25 MG PO TABS: 25.0000 mg | ORAL_TABLET | Freq: Two times a day (BID) | ORAL | 1 refills | Status: DC

## 2021-02-21 ENCOUNTER — Other Ambulatory Visit: Payer: Self-pay | Admitting: Family Medicine

## 2021-02-22 ENCOUNTER — Ambulatory Visit: Payer: Medicare Other | Admitting: Family Medicine

## 2021-02-23 ENCOUNTER — Telehealth: Payer: Self-pay

## 2021-02-23 ENCOUNTER — Ambulatory Visit: Payer: Medicare Other | Admitting: Family Medicine

## 2021-02-23 NOTE — Telephone Encounter (Signed)
I spoke with [name] to schedule AWV for both him and his wife.  He said he will call back to schedule it.

## 2021-03-02 DIAGNOSIS — Z6831 Body mass index (BMI) 31.0-31.9, adult: Secondary | ICD-10-CM | POA: Diagnosis not present

## 2021-03-02 DIAGNOSIS — E669 Obesity, unspecified: Secondary | ICD-10-CM | POA: Diagnosis not present

## 2021-03-02 DIAGNOSIS — I82509 Chronic embolism and thrombosis of unspecified deep veins of unspecified lower extremity: Secondary | ICD-10-CM | POA: Diagnosis not present

## 2021-03-04 DIAGNOSIS — R918 Other nonspecific abnormal finding of lung field: Secondary | ICD-10-CM | POA: Diagnosis not present

## 2021-03-04 DIAGNOSIS — R000 Tachycardia, unspecified: Secondary | ICD-10-CM | POA: Diagnosis not present

## 2021-03-04 DIAGNOSIS — Z950 Presence of cardiac pacemaker: Secondary | ICD-10-CM | POA: Diagnosis not present

## 2021-03-04 DIAGNOSIS — Z20822 Contact with and (suspected) exposure to covid-19: Secondary | ICD-10-CM | POA: Diagnosis not present

## 2021-03-04 DIAGNOSIS — R069 Unspecified abnormalities of breathing: Secondary | ICD-10-CM | POA: Diagnosis not present

## 2021-03-04 DIAGNOSIS — R9389 Abnormal findings on diagnostic imaging of other specified body structures: Secondary | ICD-10-CM | POA: Diagnosis not present

## 2021-03-04 DIAGNOSIS — N189 Chronic kidney disease, unspecified: Secondary | ICD-10-CM | POA: Diagnosis not present

## 2021-03-04 DIAGNOSIS — I129 Hypertensive chronic kidney disease with stage 1 through stage 4 chronic kidney disease, or unspecified chronic kidney disease: Secondary | ICD-10-CM | POA: Diagnosis not present

## 2021-03-04 DIAGNOSIS — R0902 Hypoxemia: Secondary | ICD-10-CM | POA: Diagnosis not present

## 2021-03-04 DIAGNOSIS — R6883 Chills (without fever): Secondary | ICD-10-CM | POA: Diagnosis not present

## 2021-03-04 DIAGNOSIS — R0689 Other abnormalities of breathing: Secondary | ICD-10-CM | POA: Diagnosis not present

## 2021-03-04 DIAGNOSIS — Z7901 Long term (current) use of anticoagulants: Secondary | ICD-10-CM | POA: Diagnosis not present

## 2021-03-04 DIAGNOSIS — I2510 Atherosclerotic heart disease of native coronary artery without angina pectoris: Secondary | ICD-10-CM | POA: Diagnosis not present

## 2021-03-04 DIAGNOSIS — R0602 Shortness of breath: Secondary | ICD-10-CM | POA: Diagnosis not present

## 2021-03-06 ENCOUNTER — Other Ambulatory Visit: Payer: Self-pay

## 2021-03-06 ENCOUNTER — Ambulatory Visit (INDEPENDENT_AMBULATORY_CARE_PROVIDER_SITE_OTHER): Payer: Medicare Other

## 2021-03-06 DIAGNOSIS — Z0000 Encounter for general adult medical examination without abnormal findings: Secondary | ICD-10-CM | POA: Diagnosis not present

## 2021-03-06 NOTE — Patient Instructions (Signed)
[name] , Thank you for taking time to come for your Medicare Wellness Visit. I appreciate your ongoing commitment to your health goals. Please review the following plan we discussed and let me know if I can assist you in the future.   Screening recommendations/referrals: Colonoscopy: No longer required  Recommended yearly ophthalmology/optometry visit for glaucoma screening and checkup Recommended yearly dental visit for hygiene and checkup  Vaccinations: Influenza vaccine: Due after 03/13/21 Pneumococcal vaccine: Completed  Tdap vaccine: Due and discussed  Shingles vaccine: Not a candidate    Covid-19: Completed 08/19/19, 09/16/19, & 04/08/20  Advanced directives: Please bring a copy of your health care power of attorney and living will to the office at your convenience.  Conditions/risks identified: stay Healthy  Next appointment: Follow up in one year for your annual wellness visit.   Preventive Care 85 Years and Older, Male Preventive care refers to lifestyle choices and visits with your health care provider that can promote health and wellness. What does preventive care include? A yearly physical exam. This is also called an annual well check. Dental exams once or twice a year. Routine eye exams. Ask your health care provider how often you should have your eyes checked. Personal lifestyle choices, including: Daily care of your teeth and gums. Regular physical activity. Eating a healthy diet. Avoiding tobacco and drug use. Limiting alcohol use. Practicing safe sex. Taking low doses of aspirin every day. Taking vitamin and mineral supplements as recommended by your health care provider. What happens during an annual well check? The services and screenings done by your health care provider during your annual well check will depend on your age, overall health, lifestyle risk factors, and family history of disease. Counseling  Your health care provider may ask you questions about  your: Alcohol use. Tobacco use. Drug use. Emotional well-being. Home and relationship well-being. Sexual activity. Eating habits. History of falls. Memory and ability to understand (cognition). Work and work Statistician. Screening  You may have the following tests or measurements: Height, weight, and BMI. Blood pressure. Lipid and cholesterol levels. These may be checked every 5 years, or more frequently if you are over 57 years old. Skin check. Lung cancer screening. You may have this screening every year starting at age 53 if you have a 30-pack-year history of smoking and currently smoke or have quit within the past 15 years. Fecal occult blood test (FOBT) of the stool. You may have this test every year starting at age 31. Flexible sigmoidoscopy or colonoscopy. You may have a sigmoidoscopy every 5 years or a colonoscopy every 10 years starting at age 92. Prostate cancer screening. Recommendations will vary depending on your family history and other risks. Hepatitis C blood test. Hepatitis B blood test. Sexually transmitted disease (STD) testing. Diabetes screening. This is done by checking your blood sugar (glucose) after you have not eaten for a while (fasting). You may have this done every 1-3 years. Abdominal aortic aneurysm (AAA) screening. You may need this if you are a current or former smoker. Osteoporosis. You may be screened starting at age 71 if you are at high risk. Talk with your health care provider about your test results, treatment options, and if necessary, the need for more tests. Vaccines  Your health care provider may recommend certain vaccines, such as: Influenza vaccine. This is recommended every year. Tetanus, diphtheria, and acellular pertussis (Tdap, Td) vaccine. You may need a Td booster every 10 years. Zoster vaccine. You may need this after age  60. Pneumococcal 13-valent conjugate (PCV13) vaccine. One dose is recommended after age 50. Pneumococcal  polysaccharide (PPSV23) vaccine. One dose is recommended after age 63. Talk to your health care provider about which screenings and vaccines you need and how often you need them. This information is not intended to replace advice given to you by your health care provider. Make sure you discuss any questions you have with your health care provider. Document Released: 08/26/2015 Document Revised: 04/18/2016 Document Reviewed: 05/31/2015 Elsevier Interactive Patient Education  2017 [place] Prevention in the Home Falls can cause injuries. They can happen to people of all ages. There are many things you can do to make your home safe and to help prevent falls. What can I do on the outside of my home? Regularly fix the edges of walkways and driveways and fix any cracks. Remove anything that might make you trip as you walk through a door, such as a raised step or threshold. Trim any bushes or trees on the path to your home. Use bright outdoor lighting. Clear any walking paths of anything that might make someone trip, such as rocks or tools. Regularly check to see if handrails are loose or broken. Make sure that both sides of any steps have handrails. Any raised decks and porches should have guardrails on the edges. Have any leaves, snow, or ice cleared regularly. Use sand or salt on walking paths during winter. Clean up any spills in your garage right away. This includes oil or grease spills. What can I do in the bathroom? Use night lights. Install grab bars by the toilet and in the tub and shower. Do not use towel bars as grab bars. Use non-skid mats or decals in the tub or shower. If you need to sit down in the shower, use a plastic, non-slip stool. Keep the floor dry. Clean up any water that spills on the floor as soon as it happens. Remove soap buildup in the tub or shower regularly. Attach bath mats securely with double-sided non-slip rug tape. Do not have throw rugs and other  things on the floor that can make you trip. What can I do in the bedroom? Use night lights. Make sure that you have a light by your bed that is easy to reach. Do not use any sheets or blankets that are too big for your bed. They should not hang down onto the floor. Have a firm chair that has side arms. You can use this for support while you get dressed. Do not have throw rugs and other things on the floor that can make you trip. What can I do in the kitchen? Clean up any spills right away. Avoid walking on wet floors. Keep items that you use a lot in easy-to-reach places. If you need to reach something above you, use a strong step stool that has a grab bar. Keep electrical cords out of the way. Do not use floor polish or wax that makes floors slippery. If you must use wax, use non-skid floor wax. Do not have throw rugs and other things on the floor that can make you trip. What can I do with my stairs? Do not leave any items on the stairs. Make sure that there are handrails on both sides of the stairs and use them. Fix handrails that are broken or loose. Make sure that handrails are as long as the stairways. Check any carpeting to make sure that it is firmly attached to the stairs. Fix  any carpet that is loose or worn. Avoid having throw rugs at the top or bottom of the stairs. If you do have throw rugs, attach them to the floor with carpet tape. Make sure that you have a light switch at the top of the stairs and the bottom of the stairs. If you do not have them, ask someone to add them for you. What else can I do to help prevent falls? Wear shoes that: Do not have high heels. Have rubber bottoms. Are comfortable and fit you well. Are closed at the toe. Do not wear sandals. If you use a stepladder: Make sure that it is fully opened. Do not climb a closed stepladder. Make sure that both sides of the stepladder are locked into place. Ask someone to hold it for you, if possible. Clearly  mark and make sure that you can see: Any grab bars or handrails. First and last steps. Where the edge of each step is. Use tools that help you move around (mobility aids) if they are needed. These include: Canes. Walkers. Scooters. Crutches. Turn on the lights when you go into a dark area. Replace any light bulbs as soon as they burn out. Set up your furniture so you have a clear path. Avoid moving your furniture around. If any of your floors are uneven, fix them. If there are any pets around you, be aware of where they are. Review your medicines with your doctor. Some medicines can make you feel dizzy. This can increase your chance of falling. Ask your doctor what other things that you can do to help prevent falls. This information is not intended to replace advice given to you by your health care provider. Make sure you discuss any questions you have with your health care provider. Document Released: 05/26/2009 Document Revised: 01/05/2016 Document Reviewed: 09/03/2014 Elsevier Interactive Patient Education  2017 [employer].

## 2021-03-06 NOTE — Progress Notes (Signed)
Virtual Visit via Telephone Note  I connected with  [name] on 03/06/21 at  8:45 AM EDT by telephone and verified that I am speaking with the correct person using two identifiers.  Location: Patient: Home Provider: Office  Persons participating in the virtual visit: patient/Nurse Health Advisor   I discussed the limitations, risks, security and privacy concerns of performing an evaluation and management service by telephone and the availability of in person appointments. The patient expressed understanding and agreed to proceed.  Interactive audio and video telecommunications were attempted between this nurse and patient, however failed, due to patient having technical difficulties OR patient did not have access to video capability.  We continued and completed visit with audio only.  Some vital signs may be absent or patient reported.   Willette Brace, LPN   Subjective:   [name] is a 87 y.o. male who presents for Medicare Annual/Subsequent preventive examination.  Review of Systems     Cardiac Risk Factors include: advanced age (>54men, >66 women);hypertension;dyslipidemia;male gender;obesity (BMI >30kg/m2)     Objective:    There were no vitals filed for this visit. There is no height or weight on file to calculate BMI.  Advanced Directives 03/06/2021 11/19/2019 07/04/2019 11/21/2016 11/14/2016 05/31/2014 09/24/2012  Does Patient Have a Medical Advance Directive? Yes Yes Yes Yes Yes Yes Patient has advance directive, copy not in chart  Type of Advance Directive [place];Living will Living will;Healthcare Power of Attorney Living will [place] will -  Does patient want to make changes to medical advance directive? - No - Patient declined - No - Patient declined No - Patient declined - -  Copy of [place] in Chart? No - copy requested No - copy requested - No - copy  requested No - copy requested - Copy requested from other (Comment)  Pre-existing out of facility DNR order (yellow form or pink MOST form) - - - - - - -    Current Medications (verified) Outpatient Encounter Medications as of 03/06/2021  Medication Sig   allopurinol (ZYLOPRIM) 300 MG tablet TAKE 1 TABLET DAILY   atorvastatin (LIPITOR) 40 MG tablet TAKE 1 TABLET DAILY   diphenhydramine-acetaminophen (TYLENOL PM) 25-500 MG TABS tablet Take 1 tablet by mouth at bedtime as needed (sleep).   ELIQUIS 5 MG TABS tablet TAKE 1 TABLET TWICE A DAY   finasteride (PROSCAR) 5 MG tablet Take 5 mg by mouth daily.   furosemide (LASIX) 20 MG tablet Take 1 tablet (20 mg total) by mouth daily.   metoprolol tartrate (LOPRESSOR) 25 MG tablet Take 1 tablet (25 mg total) by mouth 2 (two) times daily.   nitroGLYCERIN (NITROSTAT) 0.4 MG SL tablet Place 1 tablet (0.4 mg total) under the tongue every 5 (five) minutes as needed for chest pain.   ranolazine (RANEXA) 1000 MG SR tablet Take 1 tablet (1,000 mg total) by mouth 2 (two) times daily.   Tamsulosin HCl (FLOMAX) 0.4 MG CAPS Take 0.4 mg by mouth at bedtime.    No facility-administered encounter medications on file as of 03/06/2021.    Allergies (verified) Epinephrine, Lisinopril, Penicillins, and Novocain [procaine]   History: Past Medical History:  Diagnosis Date   Arthritis    "back, knees" (11/21/2016)   Bladder stones    CAD (coronary artery disease)    11/20/16 PCI with DES--> LAD/RCA   Chronic lower back pain    CKD (chronic kidney  disease), stage IV ([place])    [name] 11/08/2016   History of gout    History of kidney stones 12/02/2008   Hyperlipidemia    Hypertension    Myocardial infarction [facility])    "silent" (11/21/2016)   PSA, INCREASED 12/05/2009   UTI (urinary tract infection)    Past Surgical History:  Procedure Laterality Date   CARDIAC CATHETERIZATION  11/14/2016   CATARACT EXTRACTION W/ INTRAOCULAR LENS  IMPLANT, BILATERAL     CORONARY  ANGIOPLASTY WITH STENT PLACEMENT  11/21/2016   "2 stents"    CORONARY ATHERECTOMY N/A 11/21/2016   Procedure: Coronary Atherectomy;  Surgeon: Leonie Man, MD;  Location: [place] [facility];  Service: Cardiovascular;  Laterality: N/A;  RCA and LAD   CORONARY STENT INTERVENTION N/A 11/21/2016   Procedure: Coronary Stent Intervention;  Surgeon: Leonie Man, MD;  Location: [place] [facility];  Service: Cardiovascular;  Laterality: N/A;  RCA and LAD   CYSTOSCOPY W/ STONE MANIPULATION     INTRAVASCULAR PRESSURE WIRE/FFR STUDY N/A 05/22/2019   Procedure: INTRAVASCULAR PRESSURE WIRE/FFR STUDY;  Surgeon: Nelva Bush, MD;  Location: [place] [facility];  Service: Cardiovascular;  Laterality: N/A;   JOINT REPLACEMENT     KNEE ARTHROSCOPY Left    LAPAROSCOPIC CHOLECYSTECTOMY     LEFT HEART CATH N/A 11/21/2016   Procedure: Left Heart Cath;  Surgeon: Leonie Man, MD;  Location: [place] [facility];  Service: Cardiovascular;  Laterality: N/A;   LITHOTRIPSY     PILONIDAL CYST EXCISION  58   RIGHT/LEFT HEART CATH AND CORONARY ANGIOGRAPHY N/A 11/14/2016   Procedure: Right/Left Heart Cath and Coronary Angiography;  Surgeon: Leonie Man, MD;  Location: [place] [facility];  Service: Cardiovascular;  Laterality: N/A;   RIGHT/LEFT HEART CATH AND CORONARY ANGIOGRAPHY N/A 05/22/2019   Procedure: RIGHT/LEFT HEART CATH AND CORONARY ANGIOGRAPHY;  Surgeon: Nelva Bush, MD;  Location: [place] [facility];  Service: Cardiovascular;  Laterality: N/A;   TEMPORARY PACEMAKER N/A 11/21/2016   Procedure: Temporary Pacemaker;  Surgeon: Leonie Man, MD;  Location: [place] [facility];  Service: Cardiovascular;  Laterality: N/A;   TONSILLECTOMY     TOTAL KNEE ARTHROPLASTY Left 09/22/2012   Procedure: TOTAL KNEE ARTHROPLASTY;  Surgeon: Lorn Junes, MD;  Location: [place];  Service: Orthopedics;  Laterality: Left;   Family History  Problem Relation Age of Onset   Breast cancer Mother    Heart attack Father         11, smoker   Social History   Socioeconomic History   Marital status: Married    Spouse name: Not on file   Number of children: Not on file   Years of education: Not on file   Highest education level: Not on file  Occupational History   Not on file  Tobacco Use   Smoking status: Never   Smokeless tobacco: Never  Vaping Use   Vaping Use: Never used  Substance and Sexual Activity   Alcohol use: Not Currently    Comment: 11/21/2016 "nothing since 2015"   Drug use: No   Sexual activity: Yes    Partners: Female  Other Topics Concern   Not on file  Social History Narrative   Married (Wife June patient of Dr. Yong Channel). 2 sons. 4 grandkids.    Originally from near [place], [place] at [employer] 6-7 months of the year      Retired from [employer] long time. 27 years of service.  Hobbies: golf   Used to [employer], run [employer]   Social Determinants of Radio broadcast assistant Strain: Low Risk    Difficulty of Paying Living Expenses: Not hard at all  Food Insecurity: No Food Insecurity   Worried About Charity fundraiser in the Last Year: Never true   Arboriculturist in the Last Year: Never true  Transportation Needs: No Transportation Needs   Lack of Transportation (Medical): No   Lack of Transportation (Non-Medical): No  Physical Activity: Sufficiently Active   Days of Exercise per Week: 5 days   Minutes of Exercise per Session: 40 min  Stress: No Stress Concern Present   Feeling of Stress : Not at all  Social Connections: Socially Integrated   Frequency of Communication with Friends and Family: More than three times a week   Frequency of Social Gatherings with Friends and Family: Twice a week   Attends Religious Services: More than 4 times per year   Active Member of [employer] or Organizations: Yes   Attends Archivist Meetings: 1 to 4 times per year   Marital Status: Married    Tobacco Counseling Counseling given: Not  Answered   Clinical Intake:  Pre-visit preparation completed: Yes  Pain : No/denies pain     BMI - recorded: 30.62 Nutritional Status: BMI > 30  Obese Nutritional Risks: None Diabetes: No     Diabetic?No  Interpreter Needed?: No  Information entered by :: Charlott Rakes, LPN   Activities of Daily Living In your present state of health, do you have any difficulty performing the following activities: 03/06/2021 11/30/2020  Hearing? N Y  Vision? N N  Difficulty concentrating or making decisions? N N  Walking or climbing stairs? Y N  Comment SOB -  Dressing or bathing? N N  Doing errands, shopping? N N  Preparing Food and eating ? N -  Using the Toilet? N -  In the past six months, have you accidently leaked urine? N -  Do you have problems with loss of bowel control? N -  Managing your Medications? N -  Managing your Finances? N -  Housekeeping or managing your Housekeeping? N -  Some recent data might be hidden    Patient Care Team: Marin Olp, MD as PCP - General (Family Medicine) Constance Haw, MD as PCP - Electrophysiology (Cardiology) Jerline Pain, MD as PCP - Cardiology (Cardiology) Deterding, Jeneen Rinks, MD as Consulting Physician (Nephrology) [facility] as Consulting Physician (Ophthalmology) Alyson Ingles [name], MD as Consulting Physician (Urology)  Indicate any recent Medical Services you may have received from other than [facility] providers in the past year (date may be approximate).     Assessment:   This is a routine wellness examination for [place].  Hearing/Vision screen Hearing Screening - Comments:: Pt denies any hearing issues  Vision Screening - Comments:: Pt follows up with Dr Gershon Crane for annual eye exams   Dietary issues and exercise activities discussed: Current Exercise Habits: Home exercise routine, Type of exercise: Other - see comments (stationary bike), Time (Minutes): 45, Frequency (Times/Week): 5, Weekly Exercise  (Minutes/Week): 225   Goals Addressed             This Visit's Progress    Patient Stated       Stay healthy        Depression Screen PHQ 2/9 Scores 03/06/2021 11/30/2020 11/19/2019 09/14/2019 11/18/2018 10/04/2017 01/19/2016  PHQ - 2 Score 0 0 0  0 0 0 0    Fall Risk Fall Risk  03/06/2021 11/30/2020 01/29/2020 12/08/2019 11/19/2019  Falls in the past year? 0 0 0 0 0  Number falls in past yr: 0 0 0 0 0  Injury with Fall? 0 0 0 0 0  Risk for fall due to : Impaired vision - - - -  Follow up Falls prevention discussed - - - Falls evaluation completed;Education provided;Falls prevention discussed    FALL RISK PREVENTION PERTAINING TO THE HOME:  Any stairs in or around the home? Yes  If so, are there any without handrails? No  Home free of loose throw rugs in walkways, pet beds, electrical cords, etc? Yes  Adequate lighting in your home to reduce risk of falls? Yes   ASSISTIVE DEVICES UTILIZED TO PREVENT FALLS:  Life alert? No  Use of a cane, walker or w/c? No  Grab bars in the bathroom? Yes  Shower chair or bench in shower? No  Elevated toilet seat or a handicapped toilet? No   TIMED UP AND GO:  Was the test performed? No .     Cognitive Function:     6CIT Screen 03/06/2021 11/19/2019  What Year? 0 points 0 points  What month? 0 points 0 points  What time? 0 points 0 points  Count back from 20 0 points 0 points  Months in reverse 0 points 0 points  Repeat phrase 0 points 0 points  Total Score 0 0    Immunizations Immunization History  Administered Date(s) Administered   Influenza Whole 04/25/2009, 06/21/2010, 07/01/2011   Influenza, High Dose Seasonal PF 04/22/2014, 06/10/2017, 05/06/2018, 05/12/2020   Influenza,inj,Quad PF,6+ Mos 06/17/2013   Influenza-Unspecified 06/20/2000, 05/13/2002, 06/08/2003, 06/06/2004, 07/13/2006, 04/25/2011, 06/17/2013, 04/22/2014, 05/22/2016, 06/10/2017, 05/01/2019   Moderna Sars-Covid-2 Vaccination 08/19/2019, 09/16/2019, 04/08/2020    Pneumococcal Conjugate-13 12/28/2013   Pneumococcal Polysaccharide-23 10/22/2007   Pneumococcal-Unspecified 04/07/2001   Td 12/05/2009    TDAP status: Due, Education has been provided regarding the importance of this vaccine. Advised may receive this vaccine at local pharmacy or Health Dept. Aware to provide a copy of the vaccination record if obtained from local pharmacy or Health Dept. Verbalized acceptance and understanding.  Flu Vaccine status: Up to date  Pneumococcal vaccine status: Up to date  Covid-19 vaccine status: Completed vaccines  Qualifies for Shingles Vaccine? No   Zostavax completed No     Screening Tests Health Maintenance  Topic Date Due   TETANUS/TDAP  12/06/2019   COVID-19 Vaccine (4 - Booster for Moderna series) 07/09/2020   Zoster Vaccines- Shingrix (1 of 2) 06/06/2021 (Originally 11/27/1952)   INFLUENZA VACCINE  03/13/2021   PNA vac Low Risk Adult  Completed   HPV VACCINES  Aged Out    Health Maintenance  Health Maintenance Due  Topic Date Due   TETANUS/TDAP  12/06/2019   COVID-19 Vaccine (4 - Booster for Moderna series) 07/09/2020    Colorectal cancer screening: No longer required.   Additional Screening:   Vision Screening: Recommended annual ophthalmology exams for early detection of glaucoma and other disorders of the eye. Is the patient up to date with their annual eye exam?  Yes  Who is the provider or what is the name of the office in which the patient attends annual eye exams? Dr Gershon Crane  If pt is not established with a provider, would they like to be referred to a provider to establish care? No .   Dental Screening: Recommended annual dental exams for proper oral hygiene  Community Resource Referral / Chronic Care Management: CRR required this visit?  No   CCM required this visit?  No      Plan:     I have personally reviewed and noted the following in the patient's chart:   Medical and social history Use of alcohol,  tobacco or illicit drugs  Current medications and supplements including opioid prescriptions. Patient is not currently taking opioid prescriptions. Functional ability and status Nutritional status Physical activity Advanced directives List of other physicians Hospitalizations, surgeries, and ER visits in previous 12 months Vitals Screenings to include cognitive, depression, and falls Referrals and appointments  In addition, I have reviewed and discussed with patient certain preventive protocols, quality metrics, and best practice recommendations. A written personalized care plan for preventive services as well as general preventive health recommendations were provided to patient.     Willette Brace, LPN   3/71/0626   Nurse Notes: None

## 2021-03-09 DIAGNOSIS — M7989 Other specified soft tissue disorders: Secondary | ICD-10-CM | POA: Diagnosis not present

## 2021-03-09 DIAGNOSIS — I82532 Chronic embolism and thrombosis of left popliteal vein: Secondary | ICD-10-CM | POA: Diagnosis not present

## 2021-03-10 DIAGNOSIS — Z0001 Encounter for general adult medical examination with abnormal findings: Secondary | ICD-10-CM | POA: Diagnosis not present

## 2021-03-10 DIAGNOSIS — Z6831 Body mass index (BMI) 31.0-31.9, adult: Secondary | ICD-10-CM | POA: Diagnosis not present

## 2021-03-10 DIAGNOSIS — I82509 Chronic embolism and thrombosis of unspecified deep veins of unspecified lower extremity: Secondary | ICD-10-CM | POA: Diagnosis not present

## 2021-03-10 DIAGNOSIS — E669 Obesity, unspecified: Secondary | ICD-10-CM | POA: Diagnosis not present

## 2021-03-10 DIAGNOSIS — R600 Localized edema: Secondary | ICD-10-CM | POA: Diagnosis not present

## 2021-03-13 ENCOUNTER — Other Ambulatory Visit: Payer: Self-pay

## 2021-03-13 MED ORDER — RANOLAZINE ER 1000 MG PO TB12: 1000.0000 mg | ORAL_TABLET | Freq: Two times a day (BID) | ORAL | 2 refills | Status: DC

## 2021-03-20 DIAGNOSIS — Z5181 Encounter for therapeutic drug level monitoring: Secondary | ICD-10-CM | POA: Diagnosis not present

## 2021-03-20 DIAGNOSIS — E669 Obesity, unspecified: Secondary | ICD-10-CM | POA: Diagnosis not present

## 2021-03-20 DIAGNOSIS — Z6830 Body mass index (BMI) 30.0-30.9, adult: Secondary | ICD-10-CM | POA: Diagnosis not present

## 2021-03-20 DIAGNOSIS — N183 Chronic kidney disease, stage 3 unspecified: Secondary | ICD-10-CM | POA: Diagnosis not present

## 2021-03-20 DIAGNOSIS — I10 Essential (primary) hypertension: Secondary | ICD-10-CM | POA: Diagnosis not present

## 2021-03-20 DIAGNOSIS — Z86718 Personal history of other venous thrombosis and embolism: Secondary | ICD-10-CM | POA: Diagnosis not present

## 2021-04-10 ENCOUNTER — Ambulatory Visit (INDEPENDENT_AMBULATORY_CARE_PROVIDER_SITE_OTHER): Payer: Medicare Other

## 2021-04-10 DIAGNOSIS — I495 Sick sinus syndrome: Secondary | ICD-10-CM | POA: Diagnosis not present

## 2021-04-11 LAB — CUP PACEART REMOTE DEVICE CHECK
Battery Remaining Longevity: 63 mo
Battery Voltage: 2.91 V
Brady Statistic AP VP Percent: 17.23 %
Brady Statistic AP VS Percent: 81.85 %
Brady Statistic AS VP Percent: 0.03 %
Brady Statistic AS VS Percent: 0.9 %
Brady Statistic RA Percent Paced: 99.19 %
Brady Statistic RV Percent Paced: 17.26 %
Date Time Interrogation Session: 20220828115518
Implantable Lead Implant Date: 20190116
Implantable Lead Implant Date: 20190116
Implantable Lead Location: 753859
Implantable Lead Location: 753860
Implantable Lead Model: 5076
Implantable Lead Model: 5076
Implantable Pulse Generator Implant Date: 20190116
Lead Channel Impedance Value: 285 Ohm
Lead Channel Impedance Value: 304 Ohm
Lead Channel Impedance Value: 342 Ohm
Lead Channel Impedance Value: 437 Ohm
Lead Channel Pacing Threshold Amplitude: 1 V
Lead Channel Pacing Threshold Amplitude: 1.875 V
Lead Channel Pacing Threshold Pulse Width: 0.4 ms
Lead Channel Pacing Threshold Pulse Width: 0.4 ms
Lead Channel Sensing Intrinsic Amplitude: 0.625 mV
Lead Channel Sensing Intrinsic Amplitude: 0.625 mV
Lead Channel Sensing Intrinsic Amplitude: 7.5 mV
Lead Channel Sensing Intrinsic Amplitude: 7.5 mV
Lead Channel Setting Pacing Amplitude: 2 V
Lead Channel Setting Pacing Amplitude: 3.75 V
Lead Channel Setting Pacing Pulse Width: 0.4 ms
Lead Channel Setting Sensing Sensitivity: 1.2 mV

## 2021-04-18 MED ORDER — METOPROLOL TARTRATE 25 MG PO TABS
25.0000 mg | ORAL_TABLET | Freq: Two times a day (BID) | ORAL | 0 refills | Status: DC
Start: 2021-04-18 — End: 2021-06-19

## 2021-04-21 NOTE — Progress Notes (Signed)
Remote pacemaker transmission.   

## 2021-04-25 ENCOUNTER — Other Ambulatory Visit: Payer: Self-pay

## 2021-04-25 ENCOUNTER — Ambulatory Visit (INDEPENDENT_AMBULATORY_CARE_PROVIDER_SITE_OTHER): Payer: Medicare Other

## 2021-04-25 DIAGNOSIS — Z23 Encounter for immunization: Secondary | ICD-10-CM | POA: Diagnosis not present

## 2021-04-25 NOTE — Progress Notes (Signed)
Patient presented in office today for his flu shot. I administered his High Dose flu vaccine into his Left arm. He tolerated the injection very well, his injection site looked fine.

## 2021-04-26 NOTE — Progress Notes (Signed)
Phone [phone] In person visit   Subjective:   [name] is a 87 y.o. year old very pleasant male patient who presents for/with See problem oriented charting Chief Complaint  Patient presents with   Gastroesophageal Reflux   Gout   Clot    Left leg, behind his knee   This visit occurred during the SARS-CoV-2 public health emergency.  Safety protocols were in place, including screening questions prior to the visit, additional usage of staff PPE, and extensive cleaning of exam room while observing appropriate contact time as indicated for disinfecting solutions.   Past Medical History-  Patient Active Problem List   Diagnosis Date Noted   Acute deep vein thrombosis (DVT) of distal vein of left lower extremity (HCC) 11/30/2020    Priority: High   Sick sinus syndrome ([place]) 10/05/2017    Priority: High   Chronic pulmonary embolism ([place]) 12/17/2016    Priority: High   Status post coronary artery stent placement     Priority: High   Coronary artery disease involving native coronary artery with angina pectoris ([place]) 11/21/2016    Priority: High   Moderate aortic stenosis 10/31/2016    Priority: High   Exertional shortness of breath: Concern for Anginal Equivalent (class II-III) 10/30/2016    Priority: High   CKD (chronic kidney disease), stage III ([place]) 09/22/2012    Priority: High   Gout 12/13/2010    Priority: Medium   BPH (benign prostatic hyperplasia) 12/05/2009    Priority: Medium   Hyperlipidemia 05/08/2007    Priority: Medium   Essential hypertension 05/08/2007    Priority: Medium   Edema 12/08/2014    Priority: [place] maintenance 12/08/2014    Priority: Low   Left knee DJD 09/22/2012    Priority: Low   Dyspnea 02/03/2020   Dilated aortic root ([place]) 11/08/2016    Medications- reviewed and updated Current Outpatient Medications  Medication Sig Dispense Refill   allopurinol (ZYLOPRIM) 300 MG tablet TAKE 1 TABLET DAILY 90 tablet 3    atorvastatin (LIPITOR) 40 MG tablet TAKE 1 TABLET DAILY 90 tablet 3   diphenhydramine-acetaminophen (TYLENOL PM) 25-500 MG TABS tablet Take 1 tablet by mouth at bedtime as needed (sleep).     ELIQUIS 5 MG TABS tablet TAKE 1 TABLET TWICE A DAY 180 tablet 3   finasteride (PROSCAR) 5 MG tablet Take 5 mg by mouth daily.     furosemide (LASIX) 20 MG tablet Take 1 tablet (20 mg total) by mouth daily. 90 tablet 3   metoprolol tartrate (LOPRESSOR) 25 MG tablet Take 1 tablet (25 mg total) by mouth 2 (two) times daily. 20 tablet 0   nitroGLYCERIN (NITROSTAT) 0.4 MG SL tablet Place 1 tablet (0.4 mg total) under the tongue every 5 (five) minutes as needed for chest pain. 25 tablet 11   ranolazine (RANEXA) 1000 MG SR tablet Take 1 tablet (1,000 mg total) by mouth 2 (two) times daily. 180 tablet 2   Tamsulosin HCl (FLOMAX) 0.4 MG CAPS Take 0.4 mg by mouth at bedtime.      No current facility-administered medications for this visit.     Objective:  BP 111/62   Pulse 77   Temp 98.1 F (36.7 C) (Temporal)   Ht 6' (1.829 m)   Wt 242 lb 3.2 oz (109.9 kg)   SpO2 95%   BMI 32.85 kg/m  Gen: NAD, resting comfortably CV: RRR no murmurs rubs or gallops Lungs: CTAB no crackles, wheeze, rhonchi Ext: Trace edema  on right, 1+ edema on the left-no calf tenderness noted Skin: warm, dry     Assessment and Plan    # Chills/Shaking-:Patient was seen by ED on 03/04/2021 for chief complaint of Chills. Evaluation without clear cause and symptoms resolved- was discharged. Apparently patient had chills in the past which was related to a strep/staph infection- but thankfully this workup was reassuring  # Chronic DVT (2022 when off meds for 3 days in hospital)/Chronic PE (years ago) S:medication: eliquis 5 mg  -prior DVT when patient was taken off eliquis in hospital and was basically forced sedentary position. On lifelong eliquis now due to prior chronic PE  Swelling has resolved- doing well with comprssion  stockings A/P: Chronic DVT and PE appropriately treated with ongoing eliquis- continue current meds  - prior We discussed possible hematology consult (also discussed potentially testing him in case children or grandchildren need to be tested for hypercoagulability but he declines) but not sure this would change management-we need to be careful if he needs to come off of Eliquis 5 mg twice daily,  in the future due to risk of recurrence -same plan today wants to hold off  #CAD with angina/hyperlipidemia S: Medication: Atorvastatin 40 mg daily.  Ranexa 1000 mg twice daily-may cause constipation.  Also on metoprolol 25 mg twice daily.  not using nitroglycerin recently. Cardiology has not asked him to take aspirin or plavix as he is on the eliquis to reduce bleeding risk - stable chronic shortness of breath.  Lab Results  Component Value Date   CHOL 122 11/30/2020   HDL 42.20 11/30/2020   LDLCALC 58 11/30/2020   LDLDIRECT 79.0 12/08/2014   TRIG 106.0 11/30/2020   CHOLHDL 3 11/30/2020  A/P: CAD stable- baseline SOB not worsening with elevated right hemidiaphragm. No chest pain on ranexa- continue current meds  Lipids well controlled with LDL under 70- continue current meds  #Gout S: No recent flares on allopurinol 300 mg daily Lab Results  Component Value Date   LABURIC 4.3 11/30/2020  A/P:- uric acid 04/22 - well within goal. Controlled. Continue current medications.    # Dyspnea on exertion with aortic stenosis- lasix 20 mg daily - By [place] visit with 12/12/2020 with Dr.Skains:  -Overall EF is normal per Dr. Marlou Porch note.   -Aortic root is monitored with echocardiogram annually -Plan was to trial furosemide 20 mg daily -Did have an elevated right hemidiaphragm as well which could contribute and obviously aortic stenosis -Overall symptoms are stable but have not significantly improved on Lasix -Monitor creatinine with labs given ongoing Lasix  # Chronic cough- possible gerd  versus elevated hemidiaphragm S:Medication: Was controlled on pantoprazole 40 mg daily- discontinued.  In April 2021-this was used to try to suppress chronic cough  - cough didn't worsen off of that- tends to come and go A/P: overall stable- continue to monitor  #BPH-reasonable control on Flomax and finasteride even with furosemide  Recommended follow up: Return in about 6 months (around 10/26/2021) for follow-up or sooner if needed.. Future Appointments  Date Time Provider [place]  07/10/2021  3:00 PM [facility] DEVICE REMOTES [facility]  10/09/2021  3:00 PM [facility] DEVICE REMOTES [facility]  01/09/2022  8:55 AM [facility] DEVICE REMOTES [facility]  03/19/2022  8:45 AM [facility] HEALTH COACH [facility]  04/09/2022  3:00 PM [facility] DEVICE REMOTES [facility]  07/09/2022  3:00 PM [facility] DEVICE REMOTES [facility]    Lab/Order associations:   ICD-10-CM   1. Chronic deep  vein thrombosis (DVT) of distal vein of left lower extremity (HCC)  I82.5Z2     2. Other chronic pulmonary embolism without acute cor pulmonale (HCC)  I27.82     3. Coronary artery disease involving native coronary artery of native heart with angina pectoris ([place])  I25.119     4. Chronic gout without tophus, unspecified cause, unspecified site  M1A.[id number]     5. Hyperlipidemia, unspecified hyperlipidemia type  E78.5 CBC with Differential/Platelet    Comprehensive metabolic panel    Comprehensive metabolic panel    CBC with Differential/Platelet    CANCELED: CBC with Differential/Platelet    CANCELED: Comprehensive metabolic panel    6. Shortness of breath  R06.02       I,Jada Bradford,acting as a scribe for Garret Reddish, MD.,have documented all relevant documentation on the behalf of Garret Reddish, MD,as directed by  Garret Reddish, MD while in the presence of Garret Reddish, MD.    I, Garret Reddish, MD, have reviewed all  documentation for this visit. The documentation on 04/28/21 for the exam, diagnosis, procedures, and orders are all accurate and complete.   Return precautions advised.  Garret Reddish, MD

## 2021-04-28 ENCOUNTER — Encounter: Payer: Self-pay | Admitting: Family Medicine

## 2021-04-28 ENCOUNTER — Other Ambulatory Visit: Payer: Self-pay

## 2021-04-28 ENCOUNTER — Ambulatory Visit (INDEPENDENT_AMBULATORY_CARE_PROVIDER_SITE_OTHER): Payer: Medicare Other | Admitting: Family Medicine

## 2021-04-28 VITALS — BP 111/62 | HR 77 | Temp 98.1°F | Ht 72.0 in | Wt 242.2 lb

## 2021-04-28 DIAGNOSIS — I824Z2 Acute embolism and thrombosis of unspecified deep veins of left distal lower extremity: Secondary | ICD-10-CM

## 2021-04-28 DIAGNOSIS — M1A9XX0 Chronic gout, unspecified, without tophus (tophi): Secondary | ICD-10-CM

## 2021-04-28 DIAGNOSIS — I25119 Atherosclerotic heart disease of native coronary artery with unspecified angina pectoris: Secondary | ICD-10-CM | POA: Diagnosis not present

## 2021-04-28 DIAGNOSIS — R0602 Shortness of breath: Secondary | ICD-10-CM | POA: Diagnosis not present

## 2021-04-28 DIAGNOSIS — I825Z2 Chronic embolism and thrombosis of unspecified deep veins of left distal lower extremity: Secondary | ICD-10-CM | POA: Diagnosis not present

## 2021-04-28 DIAGNOSIS — I2782 Chronic pulmonary embolism: Secondary | ICD-10-CM | POA: Diagnosis not present

## 2021-04-28 DIAGNOSIS — K2100 Gastro-esophageal reflux disease with esophagitis, without bleeding: Secondary | ICD-10-CM

## 2021-04-28 DIAGNOSIS — E785 Hyperlipidemia, unspecified: Secondary | ICD-10-CM | POA: Diagnosis not present

## 2021-04-28 DIAGNOSIS — J189 Pneumonia, unspecified organism: Secondary | ICD-10-CM

## 2021-04-28 LAB — COMPREHENSIVE METABOLIC PANEL
AG Ratio: 1.7 (calc) (ref 1.0–2.5)
ALT: 15 U/L (ref 9–46)
AST: 19 U/L (ref 10–35)
Albumin: 4 g/dL (ref 3.6–5.1)
Alkaline phosphatase (APISO): 92 U/L (ref 35–144)
BUN/Creatinine Ratio: 20 (calc) (ref 6–22)
BUN: 35 mg/dL — ABNORMAL HIGH (ref 7–25)
CO2: 29 mmol/L (ref 20–32)
Calcium: 9.3 mg/dL (ref 8.6–10.3)
Chloride: 102 mmol/L (ref 98–110)
Creat: 1.76 mg/dL — ABNORMAL HIGH (ref 0.70–1.22)
Globulin: 2.4 g/dL (calc) (ref 1.9–3.7)
Glucose, Bld: 91 mg/dL (ref 65–99)
Potassium: 4.5 mmol/L (ref 3.5–5.3)
Sodium: 137 mmol/L (ref 135–146)
Total Bilirubin: 0.7 mg/dL (ref 0.2–1.2)
Total Protein: 6.4 g/dL (ref 6.1–8.1)

## 2021-04-28 LAB — CBC WITH DIFFERENTIAL/PLATELET
Absolute Monocytes: 688 cells/uL (ref 200–950)
Basophils Absolute: 30 cells/uL (ref 0–200)
Basophils Relative: 0.4 %
Eosinophils Absolute: 296 cells/uL (ref 15–500)
Eosinophils Relative: 4 %
HCT: 42.9 % (ref 38.5–50.0)
Hemoglobin: 14.2 g/dL (ref 13.2–17.1)
Lymphs Abs: 1976 cells/uL (ref 850–3900)
MCH: 31.9 pg (ref 27.0–33.0)
MCHC: 33.1 g/dL (ref 32.0–36.0)
MCV: 96.4 fL (ref 80.0–100.0)
MPV: 9.9 fL (ref 7.5–12.5)
Monocytes Relative: 9.3 %
Neutro Abs: 4410 cells/uL (ref 1500–7800)
Neutrophils Relative %: 59.6 %
Platelets: 160 10*3/uL (ref 140–400)
RBC: 4.45 10*6/uL (ref 4.20–5.80)
RDW: 13.1 % (ref 11.0–15.0)
Total Lymphocyte: 26.7 %
WBC: 7.4 10*3/uL (ref 3.8–10.8)

## 2021-04-28 NOTE — Patient Instructions (Addendum)
Health Maintenance Due  Topic Date Due   TETANUS/TDAP   - Patient will get this done at his local pharmacy.   12/06/2019   COVID-19 Vaccine (4 - Booster for Moderna series)   - Patient will get the new omicron variant shot this at his local pharmacy.  07/01/2020   Please stop by lab before you go If you have mychart- we will send your results within 3 business days of [place] receiving them.  If you do not have mychart- we will call you about results within 5 business days of [place] receiving them.  *please also note that you will see labs on mychart as soon as they post. I will later go in and write notes on them- will say "notes from Dr. Yong Channel"   Recommended follow up: Return in about 6 months (around 10/26/2021) for follow-up or sooner if needed.[name]

## 2021-05-19 ENCOUNTER — Other Ambulatory Visit: Payer: Self-pay

## 2021-05-19 MED ORDER — FUROSEMIDE 20 MG PO TABS: 20.0000 mg | ORAL_TABLET | Freq: Every day | ORAL | 2 refills | Status: DC

## 2021-06-07 DIAGNOSIS — R3914 Feeling of incomplete bladder emptying: Secondary | ICD-10-CM | POA: Diagnosis not present

## 2021-06-07 DIAGNOSIS — N401 Enlarged prostate with lower urinary tract symptoms: Secondary | ICD-10-CM | POA: Diagnosis not present

## 2021-06-19 ENCOUNTER — Other Ambulatory Visit: Payer: Self-pay | Admitting: Physician Assistant

## 2021-06-19 MED ORDER — METOPROLOL TARTRATE 25 MG PO TABS
25.0000 mg | ORAL_TABLET | Freq: Two times a day (BID) | ORAL | 0 refills | Status: DC
Start: 2021-06-19 — End: 2021-06-30

## 2021-06-30 ENCOUNTER — Encounter: Payer: Self-pay | Admitting: Cardiology

## 2021-06-30 MED ORDER — METOPROLOL TARTRATE 25 MG PO TABS: 25.0000 mg | ORAL_TABLET | Freq: Two times a day (BID) | ORAL | 1 refills | Status: DC

## 2021-06-30 MED ORDER — METOPROLOL TARTRATE 25 MG PO TABS: 25.0000 mg | ORAL_TABLET | Freq: Two times a day (BID) | ORAL | 0 refills | Status: DC

## 2021-07-10 ENCOUNTER — Ambulatory Visit (INDEPENDENT_AMBULATORY_CARE_PROVIDER_SITE_OTHER): Payer: Medicare Other

## 2021-07-10 DIAGNOSIS — I495 Sick sinus syndrome: Secondary | ICD-10-CM

## 2021-07-12 LAB — CUP PACEART REMOTE DEVICE CHECK
Battery Remaining Longevity: 79 mo
Battery Voltage: 2.92 V
Brady Statistic AP VP Percent: 20.34 %
Brady Statistic AP VS Percent: 78.99 %
Brady Statistic AS VP Percent: 0.03 %
Brady Statistic AS VS Percent: 0.65 %
Brady Statistic RA Percent Paced: 99.46 %
Brady Statistic RV Percent Paced: 20.36 %
Date Time Interrogation Session: 20221129194957
Implantable Lead Implant Date: 20190116
Implantable Lead Implant Date: 20190116
Implantable Lead Location: 753859
Implantable Lead Location: 753860
Implantable Lead Model: 5076
Implantable Lead Model: 5076
Implantable Pulse Generator Implant Date: 20190116
Lead Channel Impedance Value: 285 Ohm
Lead Channel Impedance Value: 304 Ohm
Lead Channel Impedance Value: 342 Ohm
Lead Channel Impedance Value: 437 Ohm
Lead Channel Pacing Threshold Amplitude: 0.875 V
Lead Channel Pacing Threshold Amplitude: 1.5 V
Lead Channel Pacing Threshold Pulse Width: 0.4 ms
Lead Channel Pacing Threshold Pulse Width: 0.4 ms
Lead Channel Sensing Intrinsic Amplitude: 0.375 mV
Lead Channel Sensing Intrinsic Amplitude: 0.375 mV
Lead Channel Sensing Intrinsic Amplitude: 7.125 mV
Lead Channel Sensing Intrinsic Amplitude: 7.125 mV
Lead Channel Setting Pacing Amplitude: 2 V
Lead Channel Setting Pacing Amplitude: 3 V
Lead Channel Setting Pacing Pulse Width: 0.4 ms
Lead Channel Setting Sensing Sensitivity: 1.2 mV

## 2021-07-18 NOTE — Progress Notes (Signed)
Remote pacemaker transmission.   

## 2021-08-08 ENCOUNTER — Telehealth: Payer: Self-pay | Admitting: Cardiology

## 2021-08-08 ENCOUNTER — Encounter: Payer: Self-pay | Admitting: Cardiology

## 2021-08-08 NOTE — Telephone Encounter (Signed)
I called pt- [name] If patient takes Paxlovid, he will need to decrease his Eliquis dose to 2.5mg  twice a day while taking paxloivd. It is very important for him to remember to increase it back up to 5mg  BID once he is done with his 5 day course of Paxlovid.

## 2021-08-08 NOTE — Telephone Encounter (Signed)
New Message:     Patient is in [place] and have [place]. The doctor wants him to take Paxilavid( he was not sure of spelling). The concern is, he is on Eliquis. The doctor wants to know idr Marlou Porch thinks the patient should stop the Eliquis or cut back on the dose?    Pt c/o medication issue:  1. Name of Medication: Eliquis  2. How are you currently taking this medication (dosage and times per day)? 2 times a day  3. Are you having a reaction (difficulty breathing--STAT)?   4. What is your medication issue?  Should patient take this medicine for Covid(Paxilavid) this is the way patient spelled itfor C

## 2021-08-09 NOTE — Telephone Encounter (Signed)
Called and spoke with patient. He has already picked up Paxlovid and was taking 1/2 of Eliquis is the AM and 1/2 in the PM. I reminded him to remember to increase back to a whole tablet after he was finished with paxlovid. He also mentioned that he had stopped 2 of his other medications temporarily.

## 2021-08-15 ENCOUNTER — Other Ambulatory Visit: Payer: Self-pay | Admitting: *Deleted

## 2021-08-15 MED ORDER — METOPROLOL TARTRATE 25 MG PO TABS: 25.0000 mg | ORAL_TABLET | Freq: Two times a day (BID) | ORAL | 3 refills | Status: DC

## 2021-08-22 ENCOUNTER — Encounter: Payer: Self-pay | Admitting: Family Medicine

## 2021-08-29 ENCOUNTER — Encounter: Payer: Self-pay | Admitting: Cardiology

## 2021-08-30 ENCOUNTER — Other Ambulatory Visit: Payer: Self-pay | Admitting: *Deleted

## 2021-08-30 MED ORDER — APIXABAN 5 MG PO TABS: 5.0000 mg | ORAL_TABLET | Freq: Two times a day (BID) | ORAL | 0 refills | Status: DC

## 2021-08-30 NOTE — Telephone Encounter (Addendum)
Prescription refill request for Eliquis received. Indication: choronic PE, DVT, afib  Last office visit: Charlcie Cradle, 09/21/2020 Scr: 1.76, 04/28/2021 Age: 88 yo  Weight: 109.9. kg   Verified with Milas Hock D. Okay to keep pt on Eliquis 5mg  BID. Pt requesting a 10 day supply to local pharmacy. Refill sent.   Also please see Dr. Ronney Lion note from 01/29/2020 concerning dose.

## 2021-09-06 ENCOUNTER — Other Ambulatory Visit: Payer: Self-pay

## 2021-09-06 ENCOUNTER — Encounter: Payer: Self-pay | Admitting: Emergency Medicine

## 2021-09-06 ENCOUNTER — Ambulatory Visit (INDEPENDENT_AMBULATORY_CARE_PROVIDER_SITE_OTHER): Payer: Medicare Other | Admitting: Emergency Medicine

## 2021-09-06 DIAGNOSIS — R0602 Shortness of breath: Secondary | ICD-10-CM | POA: Diagnosis not present

## 2021-09-06 MED ORDER — BUDESONIDE-FORMOTEROL FUMARATE 80-4.5 MCG/ACT IN AERO: 2.0000 | INHALATION_SPRAY | Freq: Two times a day (BID) | RESPIRATORY_TRACT | 0 refills | Status: DC

## 2021-09-06 NOTE — Patient Instructions (Addendum)
We will do a trial of Symbicort 80/4.5 mcg, 2 puffs twice a day.  Rinse and gargle after using.  Keep track of whether you benefit from this medication.  If so we can plan to continue it going forward.  Please call our office to let us know. Okay to try using albuterol 2 puffs if you need it for shortness of breath.  You could also try pretreating exertion with 2 puffs to see if it helps your breathing. You may benefit at some point from increasing exercise, conditioning.  We could consider cardiopulmonary rehab. Agree with discussing her symptoms with Dr. Marlou Porch.  If we do not make any improvement in your breathing with our interventions that he may want to check an echocardiogram or make some changes. Follow with Dr Lamonte Sakai in 6 months or sooner if you have any problems

## 2021-09-06 NOTE — Assessment & Plan Note (Addendum)
Suspect multifactorial and with at least some component of deconditioning.  He does have restriction and obstruction on pulmonary function testing, unclear how much the obstruction is contributing.  I think will be reasonable to do a trial of ICS/LABA to see if his breathing improves.  We will start Symbicort 80.  He may benefit going forward from pulmonary rehab.  May also need to follow with Dr. Marlou Porch, consider echocardiogram, evaluation of his pacer, etc. to ensure everything optimized.  We will do a trial of Symbicort.  Rinse and gargle after using.  Keep track of whether you benefit from this medication.  If so we can plan to continue it going forward.  Please call our office to let us know. Okay to try using albuterol 2 puffs if you need it for shortness of breath.  You could also try pretreating exertion with 2 puffs to see if it helps your breathing. You may benefit at some point from increasing exercise, conditioning.  We could consider cardiopulmonary rehab. Agree with discussing her symptoms with Dr. Marlou Porch.  If we do not make any improvement in your breathing with our interventions that he may want to check an echocardiogram or make some changes. Follow with Dr Lamonte Sakai in 6 months or sooner if you have any problems

## 2021-09-06 NOTE — Progress Notes (Signed)
Subjective:    Patient ID: [name], male    DOB: March 24, 1934, 88 y.o.   MRN: [id number]  HPI 88 year old man, never smoker with history of CAD, pacemaker, moderate AS (echo 01/20/2020) hypertension, CKD stage IV, renal calculi, PE and chronic lower extremity DVT on low-dose Eliquis, recently increased to full dose.  He has been having frequent urinary tract infections, was admitted recently to [facility] for this, evaluating with urology for distended bladder.  He has an elevated right hemidiaphragm, chronic on imaging.  He is experiencing exertional SOB, happens with climbing stairs. Started to happen a year ago. Dyspnea when he lays down supine. Better when he is upright. No CP, no wheeze. He does exercise some but his routine has decreased some over the last year - he still rides bike an hour a day. No desat w exertion recent hospitalization.   Echocardiogram 01/20/2020 reviewed by me, shows EF 60-65%, moderately elevated RVSP at 46 mmHg.  ROV 03/29/20 --this is a follow-up visit for 88 year old never smoker with moderate AS, hypertension, CAD, CKD stage IV, history of chronic lower extremity DVT and PE (Eliquis), UTIs, elevated right hemidiaphragm.  He has had unexplained likely multifactorial shortness of breath.  He underwent pulmonary function testing today which I have reviewed.  This shows evidence for mixed obstructive and restrictive disease on spirometry with a positive bronchodilator response, restricted lung volumes and a decreased diffusion capacity that corrects to normal range when adjusted for alveolar volume.  ROV 09/06/21 --[name] is 88, never smoker with a history of moderate aortic stenosis, hypertension, CAD, SSS with a pacer, CKD stage IV, PE/DVT on Eliquis and an elevated right hemidiaphragm.  He has been under evaluation for shortness of breath.  Pulmonary function testing shows mixed restriction and obstruction with a positive bronchodilator response consistent with asthma.   Shortness of breath has not really changed with albuterol.  Last seen in our office in April 2022 at which time chest x-ray showed no evidence of effusion or pulmonary edema.  He unfortunately had COVID in late December. He was treated with Paxlovid, reports that he had cough, fatigue. He is improved, probably back to baseline. He has exertional SOB - noticing more dyspnea w stairs, shopping.    Review of Systems As per HPI      Objective:   Physical Exam  Vitals:   09/06/21 1441  BP: 122/74  Pulse: 71  Temp: 97.6 F (36.4 C)  TempSrc: Oral  SpO2: 98%  Weight: 240 lb 9.6 oz (109.1 kg)  Height: 6\' 3"  (1.905 m)   Gen: Pleasant, well-nourished, in no distress,  normal affect  ENT: No lesions,  mouth clear,  oropharynx clear, no postnasal drip  Neck: No JVD, no stridor  Lungs: No use of accessory muscles, no crackles or wheezing on normal respiration, no wheeze on forced expiration  Cardiovascular: RRR, 2/6 M, no edema  Musculoskeletal: No deformities, no cyanosis or clubbing  Neuro: alert, awake, non focal  Skin: Warm, no lesions or rash      Assessment & Plan:  Dyspnea Suspect multifactorial and with at least some component of deconditioning.  He does have restriction and obstruction on pulmonary function testing, unclear how much the obstruction is contributing.  I think will be reasonable to do a trial of ICS/LABA to see if his breathing improves.  We will start Symbicort 80.  He may benefit going forward from pulmonary rehab.  May also need to follow with Dr. Marlou Porch, consider echocardiogram,  evaluation of his pacer, etc. to ensure everything optimized.  We will do a trial of Symbicort.  Rinse and gargle after using.  Keep track of whether you benefit from this medication.  If so we can plan to continue it going forward.  Please call our office to let us know. Okay to try using albuterol 2 puffs if you need it for shortness of breath.  You could also try pretreating  exertion with 2 puffs to see if it helps your breathing. You may benefit at some point from increasing exercise, conditioning.  We could consider cardiopulmonary rehab. Agree with discussing her symptoms with Dr. Marlou Porch.  If we do not make any improvement in your breathing with our interventions that he may want to check an echocardiogram or make some changes. Follow with Dr Lamonte Sakai in 6 months or sooner if you have any problems  Baltazar Apo, MD, PhD 09/06/2021, 3:09 PM [facility] [phone] or if no answer [phone]

## 2021-09-06 NOTE — Addendum Note (Signed)
Addended by: Gavin Potters R on: 09/06/2021 03:15 PM   Modules accepted: Orders

## 2021-09-07 ENCOUNTER — Encounter: Payer: Self-pay | Admitting: Emergency Medicine

## 2021-09-07 ENCOUNTER — Encounter: Payer: Self-pay | Admitting: Cardiology

## 2021-09-07 DIAGNOSIS — U071 COVID-19: Secondary | ICD-10-CM | POA: Diagnosis not present

## 2021-09-08 ENCOUNTER — Other Ambulatory Visit: Payer: Self-pay | Admitting: *Deleted

## 2021-09-08 MED ORDER — BUDESONIDE-FORMOTEROL FUMARATE 80-4.5 MCG/ACT IN AERO: 2.0000 | INHALATION_SPRAY | Freq: Two times a day (BID) | RESPIRATORY_TRACT | 0 refills | Status: DC

## 2021-09-12 ENCOUNTER — Other Ambulatory Visit (HOSPITAL_COMMUNITY): Payer: Self-pay

## 2021-09-18 ENCOUNTER — Telehealth: Payer: Self-pay | Admitting: Pharmacy Technician

## 2021-09-18 ENCOUNTER — Other Ambulatory Visit (HOSPITAL_COMMUNITY): Payer: Self-pay

## 2021-09-18 NOTE — Telephone Encounter (Signed)
Patient Advocate Encounter  Received notification from [employer]) that prior authorization for [facility] is required.   PA submitted on 2.6.23  Status is pending   [facility] will continue to follow  Luciano Cutter, CPhT Patient [facility] Phone: [phone] Fax:  [phone]

## 2021-09-19 ENCOUNTER — Other Ambulatory Visit (HOSPITAL_COMMUNITY): Payer: Self-pay

## 2021-09-19 NOTE — Telephone Encounter (Signed)
Received notification from [facility]) regarding a prior authorization for [facility]. Authorization has been APPROVED 2.7.23 to 12.31.2099.   Per [facility] test claim, copay for 30 days supply is $68    Authorization # [id number]

## 2021-09-19 NOTE — Telephone Encounter (Signed)
Resent PA form via fax to [phone] (PA fax # on form) and [phone] (PA dept fax # on their cover page they sent to [place])

## 2021-09-26 DIAGNOSIS — U071 COVID-19: Secondary | ICD-10-CM | POA: Diagnosis not present

## 2021-10-06 ENCOUNTER — Other Ambulatory Visit: Payer: Self-pay

## 2021-10-06 ENCOUNTER — Encounter: Payer: Self-pay | Admitting: Physician Assistant

## 2021-10-06 ENCOUNTER — Telehealth: Payer: Self-pay | Admitting: *Deleted

## 2021-10-06 ENCOUNTER — Other Ambulatory Visit: Payer: Self-pay | Admitting: *Deleted

## 2021-10-06 ENCOUNTER — Ambulatory Visit (INDEPENDENT_AMBULATORY_CARE_PROVIDER_SITE_OTHER): Payer: Medicare Other | Admitting: Physician Assistant

## 2021-10-06 VITALS — BP 108/68 | HR 81 | Ht 75.0 in | Wt 242.0 lb

## 2021-10-06 DIAGNOSIS — I4729 Other ventricular tachycardia: Secondary | ICD-10-CM | POA: Diagnosis not present

## 2021-10-06 DIAGNOSIS — Z950 Presence of cardiac pacemaker: Secondary | ICD-10-CM | POA: Diagnosis not present

## 2021-10-06 DIAGNOSIS — Z79899 Other long term (current) drug therapy: Secondary | ICD-10-CM

## 2021-10-06 DIAGNOSIS — I350 Nonrheumatic aortic (valve) stenosis: Secondary | ICD-10-CM

## 2021-10-06 DIAGNOSIS — I480 Paroxysmal atrial fibrillation: Secondary | ICD-10-CM

## 2021-10-06 DIAGNOSIS — I495 Sick sinus syndrome: Secondary | ICD-10-CM

## 2021-10-06 DIAGNOSIS — I2510 Atherosclerotic heart disease of native coronary artery without angina pectoris: Secondary | ICD-10-CM | POA: Diagnosis not present

## 2021-10-06 LAB — CUP PACEART INCLINIC DEVICE CHECK
Battery Remaining Longevity: 75 mo
Battery Voltage: 2.91 V
Brady Statistic AP VP Percent: 16.54 %
Brady Statistic AP VS Percent: 82.41 %
Brady Statistic AS VP Percent: 0.03 %
Brady Statistic AS VS Percent: 1.02 %
Brady Statistic RA Percent Paced: 99.13 %
Brady Statistic RV Percent Paced: 16.57 %
Date Time Interrogation Session: 20230224182147
Implantable Lead Implant Date: 20190116
Implantable Lead Implant Date: 20190116
Implantable Lead Location: 753859
Implantable Lead Location: 753860
Implantable Lead Model: 5076
Implantable Lead Model: 5076
Implantable Pulse Generator Implant Date: 20190116
Lead Channel Impedance Value: 304 Ohm
Lead Channel Impedance Value: 323 Ohm
Lead Channel Impedance Value: 361 Ohm
Lead Channel Impedance Value: 456 Ohm
Lead Channel Pacing Threshold Amplitude: 1 V
Lead Channel Pacing Threshold Amplitude: 1.25 V
Lead Channel Pacing Threshold Pulse Width: 0.4 ms
Lead Channel Pacing Threshold Pulse Width: 0.4 ms
Lead Channel Sensing Intrinsic Amplitude: 0.375 mV
Lead Channel Sensing Intrinsic Amplitude: 0.75 mV
Lead Channel Sensing Intrinsic Amplitude: 6.5 mV
Lead Channel Sensing Intrinsic Amplitude: 7.625 mV
Lead Channel Setting Pacing Amplitude: 2 V
Lead Channel Setting Pacing Amplitude: 3 V
Lead Channel Setting Pacing Pulse Width: 0.4 ms
Lead Channel Setting Sensing Sensitivity: 1.2 mV

## 2021-10-06 MED ORDER — APIXABAN 2.5 MG PO TABS: 2.5000 mg | ORAL_TABLET | Freq: Two times a day (BID) | ORAL | 2 refills | Status: DC

## 2021-10-06 NOTE — Patient Instructions (Addendum)
Medication Instructions:   Your physician recommends that you continue on your current medications as directed. Please refer to the Current Medication list given to you today.   *If you need a refill on your cardiac medications before your next appointment, please call your pharmacy*   Lab Work:  [place]   If you have labs (blood work) drawn today and your tests are completely normal, you will receive your results only by: [place] (if you have MyChart) OR A paper copy in the mail If you have any lab test that is abnormal or we need to change your treatment, we will call you to review the results.   Testing/Procedures:  Your physician has requested that you have an echocardiogram. Echocardiography is a painless test that uses sound waves to create images of your heart. It provides your doctor with information about the size and shape of your heart and how well your hearts chambers and valves are working. This procedure takes approximately one hour. There are no restrictions for this procedure.   Follow-Up: At [facility], you and your health needs are our priority.  As part of our continuing mission to provide you with exceptional heart care, we have created designated Provider Care Teams.  These Care Teams include your primary Cardiologist (physician) and Advanced Practice Providers (APPs -  Physician Assistants and Nurse Practitioners) who all work together to provide you with the care you need, when you need it.  We recommend signing up for the patient portal called "MyChart".  Sign up information is provided on this After Visit Summary.  MyChart is used to connect with patients for Virtual Visits (Telemedicine).  Patients are able to view lab/test results, encounter notes, upcoming appointments, etc.  Non-urgent messages can be sent to your provider as well.   To learn more about what you can do with MyChart, go to [email or web address].    Your next  appointment:   1 year(s)  The format for your next appointment:   In Person  Provider:   Allegra Lai, MD{   Other Instructions

## 2021-10-06 NOTE — Addendum Note (Signed)
Addended by: Claude Manges on: 10/06/2021 02:45 PM   Modules accepted: Orders

## 2021-10-06 NOTE — Progress Notes (Addendum)
Cardiology Office Note Date:  10/06/2021  Patient ID:  [name], [name] 03-31-1934, MRN [id number] PCP:  Marin Olp, MD  Cardiologist:  Dr. Marlou Porch Electrophysiologist: Dr. Curt Bears   Chief Complaint:  SOB  History of Present Illness: [name] is a 88 y.o. male with history of CAD (PCI with drug-eluting stents to the RCA and LAD 11/20/16), CKD (IV), renal calculi, HTN, HLD, SSSx w/PPM, dilated aortic root, NSVT, PEs, DVT, NSVT  He comes today to be seen for Dr. Curt Bears, last seen by him back in 2019, he was doing well, device interrogation noted NSVT and started on BB. This was his last in clnic EP viist  He saw Dr. Marlou Porch 06/02/20.  Planned to monitor root via echos annually.  Discussed ongoing SOB, following with pulmonary had PFT shows mixed obstructive and restrictive disease.  Has an elevated right hemidiaphragm.  Fairly unexplained multifactoral shortness of breath.  He did uncover obstructive lung disease probably mild asthma.  Albuterol utilized. No changes were made.  I saw him Feb 2022 He continues to do well. No CP, palpitations or cardiac awareness. He has baseline SOB unchanged for years. No dizzy spells, near syncope or syncope. No bleeding or signs of bleeding  His PMD monitors and manages his lipids. Discussed importance of annual EP clinic visits Remotes are up to date Low amplitude P waves appeared chronic, slightly elevated thresholds as well, some FF on the A chanel Remote brief Af and NSVT episodes, none of late No changes were made.[name]  He saw Dr. Marlou Porch may 2022, no ischemic symptoms, some SOB, edema, trial of furosemide with close monitoring of renal function, elevated R hemidiaphragm may contribute.  Pt messaged Jan 2023 that in his visit with pulmonary was discussed that adjustment in his pacemaker may provide some benefit to his SOB and advised to come in  In review of Dr. Agustina Caroli note: "at least some component of deconditioning.  He does  have restriction and obstruction on pulmonary function testing, unclear how much the obstruction is contributing.  I think will be reasonable to do a trial of ICS/LABA to see if his breathing improves.  We will start Symbicort 80.  He may benefit going forward from pulmonary rehab.  May also need to follow with Dr. Marlou Porch, consider echocardiogram, evaluation of his pacer, etc. to ensure everything optimized."  TODAY He reports his SOB is chronic, goes back years, wether is is getting worse, hard for him to say, not better. No CP, no palpitations He has not yet gotten authorization from his Insurance for the Symbicort He [place], gets SOB with walking eventually, cant walk as far as could years ago,, does ride a stationary bike, not as SOB with this He has edema R>L that has been there for years, 2/2 to an old fracture he got in the [employer] and a cyst or clot behind his knee No near syncope or syncope. No bleeding or signs of bleeding   Device information MDT dual chamber PPM implanted 08/28/2017   Past Medical History:  Diagnosis Date   Arthritis    "back, knees" (11/21/2016)   Bladder stones    CAD (coronary artery disease)    11/20/16 PCI with DES--> LAD/RCA   Chronic lower back pain    CKD (chronic kidney disease), stage IV ([place])    [name] 11/08/2016   History of gout    History of kidney stones 12/02/2008   Hyperlipidemia  Hypertension    Myocardial infarction [facility])    "silent" (11/21/2016)   PSA, INCREASED 12/05/2009   UTI (urinary tract infection)     Past Surgical History:  Procedure Laterality Date   CARDIAC CATHETERIZATION  11/14/2016   CATARACT EXTRACTION W/ INTRAOCULAR LENS  IMPLANT, BILATERAL     CORONARY ANGIOPLASTY WITH STENT PLACEMENT  11/21/2016   "2 stents"    CORONARY ATHERECTOMY N/A 11/21/2016   Procedure: Coronary Atherectomy;  Surgeon: Leonie Man, MD;  Location: [place] [facility];  Service: Cardiovascular;  Laterality: N/A;   RCA and LAD   CORONARY STENT INTERVENTION N/A 11/21/2016   Procedure: Coronary Stent Intervention;  Surgeon: Leonie Man, MD;  Location: [place] [facility];  Service: Cardiovascular;  Laterality: N/A;  RCA and LAD   CYSTOSCOPY W/ STONE MANIPULATION     INTRAVASCULAR PRESSURE WIRE/FFR STUDY N/A 05/22/2019   Procedure: INTRAVASCULAR PRESSURE WIRE/FFR STUDY;  Surgeon: Nelva Bush, MD;  Location: [place] [facility];  Service: Cardiovascular;  Laterality: N/A;   JOINT REPLACEMENT     KNEE ARTHROSCOPY Left    LAPAROSCOPIC CHOLECYSTECTOMY     LEFT HEART CATH N/A 11/21/2016   Procedure: Left Heart Cath;  Surgeon: Leonie Man, MD;  Location: [place] [facility];  Service: Cardiovascular;  Laterality: N/A;   LITHOTRIPSY     PILONIDAL CYST EXCISION  56   RIGHT/LEFT HEART CATH AND CORONARY ANGIOGRAPHY N/A 11/14/2016   Procedure: Right/Left Heart Cath and Coronary Angiography;  Surgeon: Leonie Man, MD;  Location: [place] [facility];  Service: Cardiovascular;  Laterality: N/A;   RIGHT/LEFT HEART CATH AND CORONARY ANGIOGRAPHY N/A 05/22/2019   Procedure: RIGHT/LEFT HEART CATH AND CORONARY ANGIOGRAPHY;  Surgeon: Nelva Bush, MD;  Location: [place] [facility];  Service: Cardiovascular;  Laterality: N/A;   TEMPORARY PACEMAKER N/A 11/21/2016   Procedure: Temporary Pacemaker;  Surgeon: Leonie Man, MD;  Location: [place] [facility];  Service: Cardiovascular;  Laterality: N/A;   TONSILLECTOMY     TOTAL KNEE ARTHROPLASTY Left 09/22/2012   Procedure: TOTAL KNEE ARTHROPLASTY;  Surgeon: Lorn Junes, MD;  Location: [place];  Service: Orthopedics;  Laterality: Left;    Current Outpatient Medications  Medication Sig Dispense Refill   allopurinol (ZYLOPRIM) 300 MG tablet TAKE 1 TABLET DAILY 90 tablet 3   apixaban (ELIQUIS) 5 MG TABS tablet Take 1 tablet (5 mg total) by mouth 2 (two) times daily. 20 tablet 0   atorvastatin (LIPITOR) 40 MG tablet TAKE 1 TABLET DAILY 90 tablet 3    budesonide-formoterol (SYMBICORT) 80-4.5 MCG/ACT inhaler Inhale 2 puffs into the lungs in the morning and at bedtime. 1 each 0   diphenhydramine-acetaminophen (TYLENOL PM) 25-500 MG TABS tablet Take 1 tablet by mouth at bedtime as needed (sleep).     finasteride (PROSCAR) 5 MG tablet Take 5 mg by mouth daily.     furosemide (LASIX) 20 MG tablet Take 1 tablet (20 mg total) by mouth daily. 90 tablet 2   metoprolol tartrate (LOPRESSOR) 25 MG tablet Take 1 tablet (25 mg total) by mouth 2 (two) times daily. 180 tablet 3   nitroGLYCERIN (NITROSTAT) 0.4 MG SL tablet Place 1 tablet (0.4 mg total) under the tongue every 5 (five) minutes as needed for chest pain. 25 tablet 11   ranolazine (RANEXA) 1000 MG SR tablet Take 1 tablet (1,000 mg total) by mouth 2 (two) times daily. 180 tablet 2   Tamsulosin HCl (FLOMAX) 0.4 MG CAPS Take 0.4 mg by mouth  at bedtime.      No current facility-administered medications for this visit.    Allergies:   Epinephrine, Lisinopril, Penicillins, and Novocain [procaine]   Social History:  The patient  reports that he has never smoked. He has never used smokeless tobacco. He reports that he does not currently use alcohol. He reports that he does not use drugs.   Family History:  The patient's family history includes Breast cancer in his mother; Heart attack in his father.  ROS:  Please see the history of present illness.    All other systems are reviewed and otherwise negative.   PHYSICAL EXAM:  VS:  There were no vitals taken for this visit. BMI: There is no height or weight on file to calculate BMI. Well nourished, well developed, in no acute distress HEENT: normocephalic, atraumatic Neck: no JVD, carotid bruits or masses Cardiac:  RRR; 1-2/6 SM, no rubs, or gallops Lungs:  CTA b/l, no wheezing, rhonchi or rales Abd: soft, nontender MS: no deformity or atrophy Ext: 1++ LLE, trace on R, chronic looking skin changes Skin: warm and dry, no rash Neuro:  No gross  deficits appreciated Psych: euthymic mood, full affect  PPM site is stable, no tethering or discomfort   EKG:  Done today and reviewed by myself AP/VS 85bpm  Device interrogation done today and reviewed by myself:  Battery and lead measurements are good VP 16.6% only No AF NSVT x3 all 1sec   01/20/2020: TTE IMPRESSIONS   1. Left ventricular ejection fraction, by estimation, is 60 to 65%. The  left ventricle has normal function. The left ventricle has no regional  wall motion abnormalities. Left ventricular diastolic parameters are  consistent with Grade I diastolic  dysfunction (impaired relaxation). The average left ventricular global  longitudinal strain is -17.7 %.   2. Right ventricular systolic function is normal. The right ventricular  size is normal. There is moderately elevated pulmonary artery systolic  pressure. The estimated right ventricular systolic pressure is 97.9 mmHg.   3. The mitral valve is normal in structure. Mild mitral valve  regurgitation. No evidence of mitral stenosis.   4. Tricuspid valve regurgitation is moderate.   5. The aortic valve is normal in structure. Aortic valve regurgitation is  mild. Moderate aortic valve stenosis. Aortic valve mean gradient measures  20.0 mmHg. Aortic valve Vmax measures 2.95 m/s.   6. Pulmonic valve regurgitation is moderate.   7. The inferior vena cava is normal in size with greater than 50%  respiratory variability, suggesting right atrial pressure of 3 mmHg.   Comparison(s): 02/03/19 EF 60-65%. Moderate AS 75mmHg mean PG, 2mmHg peak  PG. When compared to prior, mild increase in aortic stenosis. Repeat ECHO  in one year.     05/22/2019: [facility] Conclusions: Significant single-vessel coronary artery disease with 60% in-stent restenosis of proximal RCA stent that is mildly abnormal by hemodynamic assessment (DFR 0.89; abnormal if less than 0.90). Mild to moderate, non-obstructive coronary artery disease involving the  LAD and LCx; LAD stent is widely patent. Mildly elevated left heart, right heart, and pulmonary artery pressures. Mild to moderate aortic valve stenosis. Mildly reduced Fick cardiac output/index.   Recommendations: Optimize medical therapy, including aggressive antianginal therapy.  We will add isosorbide mononitrate 30 mg daily, to be escalated as tolerated. If symptoms persist/worsen despite maximal tolerated doses of at least two antianginal agents, PCI to proximal RCA in-stent restenosis will need to be considered. Aggressive secondary prevention. Restart apixaban tomorrow morning if no evidence  of bleeding or vascular injury at catheterization sites.    Recent Labs: 12/09/2020: Pro B Natriuretic peptide (BNP) 94.0 04/28/2021: ALT 15; BUN 35; Creat 1.76; Hemoglobin 14.2; Platelets 160; Potassium 4.5; Sodium 137  11/30/2020: Cholesterol 122; HDL 42.20; LDL Cholesterol 58; Total CHOL/HDL Ratio 3; Triglycerides 106.0; VLDL 21.2   CrCl cannot be calculated (Patient's most recent lab result is older than the maximum 21 days allowed.).   Wt Readings from Last 3 Encounters:  09/06/21 240 lb 9.6 oz (109.1 kg)  04/28/21 242 lb 3.2 oz (109.9 kg)  12/12/20 245 lb (111.1 kg)     Other studies reviewed: Additional studies/records reviewed today include: summarized above  ASSESSMENT AND PLAN:  1. PPM     Intact function     No programming changes made  2. CAD      No ischemic symptoms     On BB, ranexa, statin     C/w Dr. Marlou Porch  3. HTN     No changes   4. VHD      Mod AS     Update echo with SOB (though sounds unchanged for a couple years)  5. NSVT     Known for him  6. PAFib     0 % burden     CHA2DS2Vasc is 4, already on Eliquis for his DVT/PE history  Age and Creat >1.5, noted, will repeat a BMET, likely to change to 2.5mg  dose  ADDEND: After the patient left, I see thathis Creat has been chronically >1.5, not newly elevated, will reducehis eliquis to 2.5mg  BID I have  asked my MA to send the new Rx in and notify the patient  7. DOE Perhaps a slow up-tick, but not new for years Rate response is on, his HR today after walking in was in the 80's with good excursion of rate No clear programming adjustments that I see to improve SOB Update his echo  Disposition: remotes as usual, Dr. Marlou Porch as scheduled, and in clinic with EP in a year, sooner if needed  Current medicines are reviewed at length with the patient today.  The patient did not have any concerns regarding medicines.  Venetia Night, PA-C 10/06/2021 4:29 AM     [facility] [place]  [place] [address] [phone] (office)  [phone] (fax)

## 2021-10-06 NOTE — Telephone Encounter (Signed)
Spoke with patient for medication changes made per [facility] after office visit. Patient aware of  reducing his Eliquis dose to 2.5 mg BID and this is secondary to his age and kidney function, that we are reducing the strength.  Patient was agreeable and Rx sent into [facility].

## 2021-10-06 NOTE — Addendum Note (Signed)
Addended by: Claude Manges on: 10/06/2021 02:54 PM   Modules accepted: Orders

## 2021-10-25 ENCOUNTER — Other Ambulatory Visit: Payer: Self-pay

## 2021-10-25 ENCOUNTER — Other Ambulatory Visit: Payer: Medicare Other

## 2021-10-25 ENCOUNTER — Ambulatory Visit (HOSPITAL_COMMUNITY): Payer: Medicare Other | Attending: Cardiovascular Disease

## 2021-10-25 DIAGNOSIS — I495 Sick sinus syndrome: Secondary | ICD-10-CM | POA: Diagnosis not present

## 2021-10-25 LAB — ECHOCARDIOGRAM COMPLETE
AR max vel: 0.85 cm2
AV Area VTI: 0.81 cm2
AV Area mean vel: 0.76 cm2
AV Mean grad: 21.8 mmHg
AV Peak grad: 34.8 mmHg
Ao pk vel: 2.95 m/s
Area-P 1/2: 3.91 cm2
P 1/2 time: 582 msec
S' Lateral: 3.6 cm

## 2021-10-25 NOTE — Progress Notes (Signed)
?Cardiology Office Note:   ? ?Date:  10/26/2021  ? ?ID:  [name], DOB [birth year], MRN [id number] ? ?PCP:  Marin Olp, MD ?  ?[place]  ?Cardiologist:  [name], MD  ?Advanced Practice Provider:  No care team member to display ?Electrophysiologist:  Will Meredith Leeds, MD  ?    ?Referring MD: Marin Olp, MD  ? ?History of Present Illness:   ? ?[name] is a 88 y.o. male here for follow-up of coronary artery disease prior PCI with DES to the RCA, LAD in 2018, moderate aortic stenosis, CKD 4, hypertension hyperlipidemia sick sinus syndrome with pacemaker, dilated aortic root prior sustained ventricular tachycardia, PE, DVT. ? ?Previously seen by Dr. Curt Bears. We have been monitoring his aortic root via echocardiogram annually. Also had pulmonary evaluation as well as showed mixed obstructive and restrictive disease. Elevated right hemidiaphragm noted. His pacemaker was implanted in 2019.  [employer]. ? ?Echocardiogram 01/20/2020 showed EF of 60 to 65% with moderate aortic valve stenosis 20 mmHg.  Similar findings in 2023. ? ?He had an admission to the hospital in November 13, 2020 with hypoxia at [facility]. In review of records and review of Dr. Ansel Bong report, they found a DVT in his leg.  He was placed on Eliquis. Considered hematology consult. Patient did have a CT angiogram that was negative for PE moderate aortic stenosis once again seen on echocardiogram at [facility] in April 2022. ? ?He saw Tommye Standard, PA-C 10/06/21 and continued to complain of chronic shortness of breath. He had not yet received authorization for Symbicort. His Eliquis was reduced to 2.5 mg BID due to chronic creatinine >1.5. ? ?Echo 10/25/21 showed LVEF 60 to 65% with grade 2 diastolic dysfunction, unchanged aortic valve, and aortic dilation to 39 mm. ? ?Today, he is doing well. He has seen a pulmonologist for his shortness of breath. He was told he may need to re-check his pacemaker settings and  aortic valve function as causes of his dyspnea. His Eliquis was reduced and his Lasix was discontinued due to ineffectiveness. He is doing well on his medications.  ? ?He denies any palpitations, chest pain, lightheadedness, headaches, syncope, orthopnea, PND, or exertional symptoms. ? ?Past Medical History:  ?Diagnosis Date  ? Arthritis   ? "back, knees" (11/21/2016)  ? Bladder stones   ? CAD (coronary artery disease)   ? 11/20/16 PCI with DES--> LAD/RCA  ? Chronic lower back pain   ? CKD (chronic kidney disease), stage IV ([place])   ? [name] 11/08/2016  ? History of gout   ? History of kidney stones 12/02/2008  ? Hyperlipidemia   ? Hypertension   ? Myocardial infarction [facility])   ? "silent" (11/21/2016)  ? PSA, INCREASED 12/05/2009  ? UTI (urinary tract infection)   ? ? ?Past Surgical History:  ?Procedure Laterality Date  ? CARDIAC CATHETERIZATION  11/14/2016  ? CATARACT EXTRACTION W/ INTRAOCULAR LENS  IMPLANT, BILATERAL    ? CORONARY ANGIOPLASTY WITH STENT PLACEMENT  11/21/2016  ? "2 stents"   ? CORONARY ATHERECTOMY N/A 11/21/2016  ? Procedure: Coronary Atherectomy;  Surgeon: Leonie Man, MD;  Location: [place] [facility];  Service: Cardiovascular;  Laterality: N/A;  RCA and LAD  ? CORONARY STENT INTERVENTION N/A 11/21/2016  ? Procedure: Coronary Stent Intervention;  Surgeon: Leonie Man, MD;  Location: [place] [facility];  Service: Cardiovascular;  Laterality: N/A;  RCA and LAD  ? CYSTOSCOPY W/ STONE MANIPULATION    ?  INTRAVASCULAR PRESSURE WIRE/FFR STUDY N/A 05/22/2019  ? Procedure: INTRAVASCULAR PRESSURE WIRE/FFR STUDY;  Surgeon: Nelva Bush, MD;  Location: [place] [facility];  Service: Cardiovascular;  Laterality: N/A;  ? JOINT REPLACEMENT    ? KNEE ARTHROSCOPY Left   ? LAPAROSCOPIC CHOLECYSTECTOMY    ? LEFT HEART CATH N/A 11/21/2016  ? Procedure: Left Heart Cath;  Surgeon: Leonie Man, MD;  Location: [place] [facility];  Service: Cardiovascular;  Laterality: N/A;  ? LITHOTRIPSY    ? PILONIDAL CYST  EXCISION  56  ? RIGHT/LEFT HEART CATH AND CORONARY ANGIOGRAPHY N/A 11/14/2016  ? Procedure: Right/Left Heart Cath and Coronary Angiography;  Surgeon: Leonie Man, MD;  Location: [place] [facility];  Service: Cardiovascular;  Laterality: N/A;  ? RIGHT/LEFT HEART CATH AND CORONARY ANGIOGRAPHY N/A 05/22/2019  ? Procedure: RIGHT/LEFT HEART CATH AND CORONARY ANGIOGRAPHY;  Surgeon: Nelva Bush, MD;  Location: [place] [facility];  Service: Cardiovascular;  Laterality: N/A;  ? TEMPORARY PACEMAKER N/A 11/21/2016  ? Procedure: Temporary Pacemaker;  Surgeon: Leonie Man, MD;  Location: [place] [facility];  Service: Cardiovascular;  Laterality: N/A;  ? TONSILLECTOMY    ? TOTAL KNEE ARTHROPLASTY Left 09/22/2012  ? Procedure: TOTAL KNEE ARTHROPLASTY;  Surgeon: Lorn Junes, MD;  Location: [place];  Service: Orthopedics;  Laterality: Left;  ? ? ?Current Medications: ?Current Meds  ?Medication Sig  ? allopurinol (ZYLOPRIM) 300 MG tablet TAKE 1 TABLET DAILY  ? apixaban (ELIQUIS) 2.5 MG TABS tablet Take 1 tablet (2.5 mg total) by mouth 2 (two) times daily.  ? atorvastatin (LIPITOR) 40 MG tablet TAKE 1 TABLET DAILY  ? budesonide-formoterol (SYMBICORT) 80-4.5 MCG/ACT inhaler Inhale 2 puffs into the lungs in the morning and at bedtime.  ? diphenhydramine-acetaminophen (TYLENOL PM) 25-500 MG TABS tablet Take 1 tablet by mouth at bedtime as needed (sleep).  ? finasteride (PROSCAR) 5 MG tablet Take 5 mg by mouth daily.  ? metoprolol tartrate (LOPRESSOR) 25 MG tablet Take 1 tablet (25 mg total) by mouth 2 (two) times daily.  ? nitroGLYCERIN (NITROSTAT) 0.4 MG SL tablet Place 1 tablet (0.4 mg total) under the tongue every 5 (five) minutes as needed for chest pain.  ? ranolazine (RANEXA) 1000 MG SR tablet Take 1 tablet (1,000 mg total) by mouth 2 (two) times daily.  ? Tamsulosin HCl (FLOMAX) 0.4 MG CAPS Take 0.4 mg by mouth at bedtime.   ?  ? ?Allergies:   Epinephrine, Penicillins, Lisinopril, and Novocain [procaine]  ? ?Social  History  ? ?Socioeconomic History  ? Marital status: Married  ?  Spouse name: Not on file  ? Number of children: Not on file  ? Years of education: Not on file  ? Highest education level: Not on file  ?Occupational History  ? Not on file  ?Tobacco Use  ? Smoking status: Never  ? Smokeless tobacco: Never  ?Vaping Use  ? Vaping Use: Never used  ?Substance and Sexual Activity  ? Alcohol use: Not Currently  ?  Comment: 11/21/2016 "nothing since 2015"  ? Drug use: No  ? Sexual activity: Yes  ?  Partners: Female  ?Other Topics Concern  ? Not on file  ?Social History Narrative  ? Married (Wife June patient of Dr. Yong Channel). 2 sons. 4 grandkids.   ? Originally from near [place], [place]  ? Lives at [place] 6-7 months of the year  ?   ? Retired from [employer] long time. 27 years of service.   ?   ?  Hobbies: golf  ? Used to [employer], run marathons  ? ?Social Determinants of Health  ? ?Financial Resource Strain: Low Risk   ? Difficulty of Paying Living Expenses: Not hard at all  ?Food Insecurity: No Food Insecurity  ? Worried About Charity fundraiser in the Last Year: Never true  ? Ran Out of Food in the Last Year: Never true  ?Transportation Needs: No Transportation Needs  ? Lack of Transportation (Medical): No  ? Lack of Transportation (Non-Medical): No  ?Physical Activity: Sufficiently Active  ? Days of Exercise per Week: 5 days  ? Minutes of Exercise per Session: 40 min  ?Stress: No Stress Concern Present  ? Feeling of Stress : Not at all  ?Social Connections: Socially Integrated  ? Frequency of Communication with Friends and Family: More than three times a week  ? Frequency of Social Gatherings with Friends and Family: Twice a week  ? Attends Religious Services: More than 4 times per year  ? Active Member of Clubs or Organizations: Yes  ? Attends Archivist Meetings: 1 to 4 times per year  ? Marital Status: Married  ?  ? ?Family History: ?The patient's family history includes Breast cancer in his  mother; Heart attack in his father. ? ?ROS:   ?Please see the history of present illness.    ?(+) Shortness of breath ?All other systems reviewed and are negative. ? ?EKGs/Labs/Other Studies Reviewed:   ? ?EKG: EKG was

## 2021-10-26 ENCOUNTER — Ambulatory Visit (INDEPENDENT_AMBULATORY_CARE_PROVIDER_SITE_OTHER): Payer: Medicare Other | Admitting: Cardiology

## 2021-10-26 ENCOUNTER — Encounter: Payer: Self-pay | Admitting: Cardiology

## 2021-10-26 VITALS — BP 122/70 | HR 91 | Ht 75.0 in | Wt 244.0 lb

## 2021-10-26 DIAGNOSIS — I77810 Thoracic aortic ectasia: Secondary | ICD-10-CM | POA: Diagnosis not present

## 2021-10-26 DIAGNOSIS — I495 Sick sinus syndrome: Secondary | ICD-10-CM | POA: Diagnosis not present

## 2021-10-26 DIAGNOSIS — I2782 Chronic pulmonary embolism: Secondary | ICD-10-CM | POA: Diagnosis not present

## 2021-10-26 DIAGNOSIS — I25119 Atherosclerotic heart disease of native coronary artery with unspecified angina pectoris: Secondary | ICD-10-CM | POA: Diagnosis not present

## 2021-10-26 DIAGNOSIS — N183 Chronic kidney disease, stage 3 unspecified: Secondary | ICD-10-CM | POA: Diagnosis not present

## 2021-10-26 DIAGNOSIS — I350 Nonrheumatic aortic (valve) stenosis: Secondary | ICD-10-CM | POA: Diagnosis not present

## 2021-10-26 NOTE — Assessment & Plan Note (Signed)
Previously followed at nephrology office.  Most recent creatinine 1.76 ?

## 2021-10-26 NOTE — Assessment & Plan Note (Signed)
Prior cardiac catheterization personally reviewed and interpreted.  Continue with Ranexa 1000 mg twice a day as well as metoprolol 25 mg twice a day.  No changes made.  Overall reasonable antianginal support.  Still has shortness of breath with activity.  Appreciate pulmonary's assistance as well. ?

## 2021-10-26 NOTE — Assessment & Plan Note (Signed)
Continuing with Eliquis 2.5 mg twice a day for prevention. ?

## 2021-10-26 NOTE — Assessment & Plan Note (Signed)
Previous pacemaker placed in [place].  Continue to monitor with Dr. Curt Bears.  Overall normal function.  Reports reviewed. ?

## 2021-10-26 NOTE — Patient Instructions (Signed)
Medication Instructions:  ?The current medical regimen is effective;  continue present plan and medications. ? ?*If you need a refill on your cardiac medications before your next appointment, please call your pharmacy* ? ?Testing/Procedures: ?Your physician has requested that you have an echocardiogram in 1 year (before seeing Dr Marlou Porch). Echocardiography is a painless test that uses sound waves to create images of your heart. It provides your doctor with information about the size and shape of your heart and how well your heart?s chambers and valves are working. This procedure takes approximately one hour. There are no restrictions for this procedure. ? ?Follow-Up: ?At [facility], you and your health needs are our priority.  As part of our continuing mission to provide you with exceptional heart care, we have created designated Provider Care Teams.  These Care Teams include your primary Cardiologist (physician) and Advanced Practice Providers (APPs -  Physician Assistants and Nurse Practitioners) who all work together to provide you with the care you need, when you need it. ? ?We recommend signing up for the patient portal called "MyChart".  Sign up information is provided on this After Visit Summary.  MyChart is used to connect with patients for Virtual Visits (Telemedicine).  Patients are able to view lab/test results, encounter notes, upcoming appointments, etc.  Non-urgent messages can be sent to your provider as well.   ?To learn more about what you can do with MyChart, go to [email or web address].   ? ?Your next appointment:   ?1 year(s) ? ?The format for your next appointment:   ?In Person ? ?Provider:   ?[name], MD   ? ? ?Thank you for choosing [facility]!! ? ? ? ?

## 2021-10-26 NOTE — Assessment & Plan Note (Signed)
Explained that surgical repair/TAVR repair would be once aortic valve had severe.  With moderate level stenosis, it is likely playing somewhat of a role in his symptomatology of shortness of breath but does not require replacement at this point. ?

## 2021-10-26 NOTE — Assessment & Plan Note (Signed)
Most recent echocardiogram 39 mm.  Minimally dilated. ?

## 2021-10-29 ENCOUNTER — Other Ambulatory Visit: Payer: Self-pay | Admitting: Family Medicine

## 2021-11-28 ENCOUNTER — Other Ambulatory Visit: Payer: Self-pay | Admitting: Family Medicine

## 2021-11-28 NOTE — Telephone Encounter (Signed)
Need to see his provider for future refills.  ?

## 2021-12-04 DIAGNOSIS — N401 Enlarged prostate with lower urinary tract symptoms: Secondary | ICD-10-CM | POA: Diagnosis not present

## 2021-12-04 DIAGNOSIS — R3914 Feeling of incomplete bladder emptying: Secondary | ICD-10-CM | POA: Diagnosis not present

## 2021-12-06 ENCOUNTER — Other Ambulatory Visit: Payer: Self-pay | Admitting: Cardiology

## 2021-12-07 ENCOUNTER — Ambulatory Visit (INDEPENDENT_AMBULATORY_CARE_PROVIDER_SITE_OTHER): Payer: Medicare Other

## 2021-12-07 DIAGNOSIS — I495 Sick sinus syndrome: Secondary | ICD-10-CM

## 2021-12-07 LAB — CUP PACEART REMOTE DEVICE CHECK
Battery Remaining Longevity: 77 mo
Battery Voltage: 2.91 V
Brady Statistic AP VP Percent: 31.84 %
Brady Statistic AP VS Percent: 67.94 %
Brady Statistic AS VP Percent: 0.03 %
Brady Statistic AS VS Percent: 0.2 %
Brady Statistic RA Percent Paced: 99.84 %
Brady Statistic RV Percent Paced: 31.86 %
Date Time Interrogation Session: 20230427111443
Implantable Lead Implant Date: 20190116
Implantable Lead Implant Date: 20190116
Implantable Lead Location: 753859
Implantable Lead Location: 753860
Implantable Lead Model: 5076
Implantable Lead Model: 5076
Implantable Pulse Generator Implant Date: 20190116
Lead Channel Impedance Value: 285 Ohm
Lead Channel Impedance Value: 304 Ohm
Lead Channel Impedance Value: 342 Ohm
Lead Channel Impedance Value: 456 Ohm
Lead Channel Pacing Threshold Amplitude: 1.125 V
Lead Channel Pacing Threshold Amplitude: 1.25 V
Lead Channel Pacing Threshold Pulse Width: 0.4 ms
Lead Channel Pacing Threshold Pulse Width: 0.4 ms
Lead Channel Sensing Intrinsic Amplitude: 0.25 mV
Lead Channel Sensing Intrinsic Amplitude: 0.25 mV
Lead Channel Sensing Intrinsic Amplitude: 7 mV
Lead Channel Sensing Intrinsic Amplitude: 7 mV
Lead Channel Setting Pacing Amplitude: 2.25 V
Lead Channel Setting Pacing Amplitude: 2.75 V
Lead Channel Setting Pacing Pulse Width: 0.4 ms
Lead Channel Setting Sensing Sensitivity: 1.2 mV

## 2021-12-08 ENCOUNTER — Encounter: Payer: Self-pay | Admitting: Family Medicine

## 2021-12-20 ENCOUNTER — Other Ambulatory Visit: Payer: Self-pay | Admitting: Cardiology

## 2021-12-22 NOTE — Progress Notes (Signed)
Remote pacemaker transmission.   

## 2022-02-26 ENCOUNTER — Other Ambulatory Visit: Payer: Self-pay | Admitting: Family Medicine

## 2022-03-19 ENCOUNTER — Ambulatory Visit (INDEPENDENT_AMBULATORY_CARE_PROVIDER_SITE_OTHER): Payer: Medicare Other

## 2022-03-19 DIAGNOSIS — Z0000 Encounter for general adult medical examination without abnormal findings: Secondary | ICD-10-CM | POA: Diagnosis not present

## 2022-03-19 NOTE — Progress Notes (Signed)
Subjective:   [name] is a 88 y.o. male who presents for Medicare Annual/Subsequent preventive examination.  Review of Systems     Cardiac Risk Factors include: advanced age (>11mn, >>65women);dyslipidemia;hypertension;male gender     Objective:    There were no vitals filed for this visit. There is no height or weight on file to calculate BMI.     03/19/2022    8:47 AM 03/06/2021    8:54 AM 11/19/2019   10:20 AM 07/04/2019    2:19 AM 11/21/2016   10:26 AM 11/14/2016   11:17 AM 05/31/2014   10:50 AM  Advanced Directives  Does Patient Have a Medical Advance Directive? Yes Yes Yes Yes Yes Yes Yes  Type of [place]of [place] will Living will;Healthcare Power of Attorney Living will [place]  Does patient want to make changes to medical advance directive?   No - Patient declined  No - Patient declined No - Patient declined   Copy of [place] Chart? No - copy requested No - copy requested No - copy requested  No - copy requested No - copy requested     Current Medications (verified) Outpatient Encounter Medications as of 03/19/2022  Medication Sig   allopurinol (ZYLOPRIM) 300 MG tablet TAKE 1 TABLET DAILY   apixaban (ELIQUIS) 2.5 MG TABS tablet Take 1 tablet (2.5 mg total) by mouth 2 (two) times daily.   atorvastatin (LIPITOR) 40 MG tablet TAKE 1 TABLET DAILY   budesonide-formoterol (SYMBICORT) 80-4.5 MCG/ACT inhaler Inhale 2 puffs into the lungs in the morning and at bedtime.   diphenhydramine-acetaminophen (TYLENOL PM) 25-500 MG TABS tablet Take 1 tablet by mouth at bedtime as needed (sleep).   finasteride (PROSCAR) 5 MG tablet Take 5 mg by mouth daily.   furosemide (LASIX) 20 MG tablet TAKE 1 TABLET DAILY   metoprolol tartrate (LOPRESSOR) 25 MG tablet TAKE 1 TABLET TWICE A DAY   nitroGLYCERIN (NITROSTAT) 0.4  MG SL tablet Place 1 tablet (0.4 mg total) under the tongue every 5 (five) minutes as needed for chest pain.   ranolazine (RANEXA) 1000 MG SR tablet TAKE 1 TABLET TWICE A DAY   Tamsulosin HCl (FLOMAX) 0.4 MG CAPS Take 0.4 mg by mouth at bedtime.    No facility-administered encounter medications on file as of 03/19/2022.    Allergies (verified) Epinephrine, Penicillins, Lisinopril, and Novocain [procaine]   History: Past Medical History:  Diagnosis Date   Arthritis    "back, knees" (11/21/2016)   Bladder stones    CAD (coronary artery disease)    11/20/16 PCI with DES--> LAD/RCA   Chronic lower back pain    CKD (chronic kidney disease), [place] IV ([place]    /[name]3/29/2018   History of gout    History of kidney stones 12/02/2008   Hyperlipidemia    Hypertension    [facility] infarction ([facility]    "silent" (11/21/2016)   PSA, INCREASED 12/05/2009   UTI (urinary tract infection)    Past Surgical History:  Procedure Laterality Date   CARDIAC CATHETERIZATION  11/14/2016   CATARACT EXTRACTION W/ INTRAOCULAR LENS  IMPLANT, BILATERAL     CORONARY ANGIOPLASTY WITH STENT PLACEMENT  11/21/2016   "2 stents"    CORONARY ATHERECTOMY N/A 11/21/2016   Procedure: Coronary Atherectomy;  Surgeon: DLeonie Man MD;  [facility];  Service: Cardiovascular;  Laterality: N/A;  RCA  and LAD   CORONARY STENT INTERVENTION N/A 11/21/2016   Procedure: Coronary Stent Intervention;  Surgeon: Leonie Man, MD;  Location: [place] [facility];  Service: Cardiovascular;  Laterality: N/A;  RCA and LAD   CYSTOSCOPY W/ STONE MANIPULATION     INTRAVASCULAR PRESSURE WIRE/FFR STUDY N/A 05/22/2019   Procedure: INTRAVASCULAR PRESSURE WIRE/FFR STUDY;  Surgeon: Nelva Bush, MD;  Location: [place] [facility];  Service: Cardiovascular;  Laterality: N/A;   JOINT REPLACEMENT     KNEE ARTHROSCOPY Left    LAPAROSCOPIC CHOLECYSTECTOMY     LEFT HEART CATH N/A 11/21/2016   Procedure: Left Heart Cath;  Surgeon:  Leonie Man, MD;  Location: [place] [facility];  Service: Cardiovascular;  Laterality: N/A;   LITHOTRIPSY     PILONIDAL CYST EXCISION  58   RIGHT/LEFT HEART CATH AND CORONARY ANGIOGRAPHY N/A 11/14/2016   Procedure: Right/Left Heart Cath and Coronary Angiography;  Surgeon: Leonie Man, MD;  Location: [place] [facility];  Service: Cardiovascular;  Laterality: N/A;   RIGHT/LEFT HEART CATH AND CORONARY ANGIOGRAPHY N/A 05/22/2019   Procedure: RIGHT/LEFT HEART CATH AND CORONARY ANGIOGRAPHY;  Surgeon: Nelva Bush, MD;  Location: [place] [facility];  Service: Cardiovascular;  Laterality: N/A;   TEMPORARY PACEMAKER N/A 11/21/2016   Procedure: Temporary Pacemaker;  Surgeon: Leonie Man, MD;  Location: [place] [facility];  Service: Cardiovascular;  Laterality: N/A;   TONSILLECTOMY     TOTAL KNEE ARTHROPLASTY Left 09/22/2012   Procedure: TOTAL KNEE ARTHROPLASTY;  Surgeon: Lorn Junes, MD;  Location: [place];  Service: Orthopedics;  Laterality: Left;   Family History  Problem Relation Age of Onset   Breast cancer Mother    Heart attack Father        12, smoker   Social History   Socioeconomic History   Marital status: Married    Spouse name: Not on file   Number of children: Not on file   Years of education: Not on file   Highest education level: Not on file  Occupational History   Not on file  Tobacco Use   Smoking status: Never   Smokeless tobacco: Never  Vaping Use   Vaping Use: Never used  Substance and Sexual Activity   Alcohol use: Not Currently    Comment: 11/21/2016 "nothing since 2015"   Drug use: No   Sexual activity: Yes    Partners: Female  Other Topics Concern   Not on file  Social History Narrative   Married (Wife [name] patient of Dr. Yong Channel). 2 sons. 4 grandkids.    Originally from near [place], [place] at [employer] 6-7 months of the year      Retired from [employer] long time. 27 years of service.       Hobbies: golf   Used to [employer], run [employer]   Social Determinants of [employer]: Low Risk  (03/19/2022)   Overall Financial Resource Strain (CARDIA)    Difficulty of Paying Living Expenses: Not hard at all  Food Insecurity: No Food Insecurity (03/19/2022)   Hunger Vital Sign    Worried About Running Out of Food in the Last Year: Never true    Ran Out of Food in the Last Year: Never true  Transportation Needs: No Transportation Needs (03/19/2022)   PRAPARE - Hydrologist (Medical): No    Lack of Transportation (Non-Medical): No  Physical Activity: Insufficiently Active (03/19/2022)  Exercise Vital Sign    Days of Exercise per Week: 5 days    Minutes of Exercise per Session: 20 min  Stress: Stress Concern Present (03/19/2022)   [place]    Feeling of Stress : To some extent  Social Connections: Moderately Isolated (03/19/2022)   Social Connection and Isolation Panel [NHANES]    Frequency of Communication with Friends and Family: More than three times a week    Frequency of Social Gatherings with Friends and Family: More than three times a week    Attends Religious Services: Never    Marine scientist or Organizations: No    Attends Music therapist: Never    Marital Status: Married    Tobacco Counseling Counseling given: Not Answered   Clinical Intake:  Pre-visit preparation completed: Yes  Pain : No/denies pain     Nutritional Risks: None Diabetes: No  How often do you need to have someone help you when you read instructions, pamphlets, or other written materials from your doctor or pharmacy?: 1 - Never  Diabetic?no  Interpreter Needed?: No  Information entered by :: Charlott Rakes , LPN   Activities of Daily Living    03/19/2022    8:48 AM  In your present state of health, do you have any difficulty performing the following activities:  Hearing? 0  Vision?  0  Difficulty concentrating or making decisions? 0  Walking or climbing stairs? 1  Comment SOB  Dressing or bathing? 0  Doing errands, shopping? 0  Preparing Food and eating ? N  Using the Toilet? N  In the past six months, have you accidently leaked urine? N  Do you have problems with loss of bowel control? N  Managing your Medications? N  Managing your Finances? N  Housekeeping or managing your Housekeeping? N    Patient Care Team: Marin Olp, MD as PCP - General (Family Medicine) Constance Haw, MD as PCP - Electrophysiology (Cardiology) Jerline Pain, MD as PCP - Cardiology (Cardiology) Deterding, Jeneen Rinks, MD as Consulting Physician (Nephrology) [facility] as Consulting Physician (Ophthalmology) Alyson Ingles [name], MD as Consulting Physician (Urology)  Indicate any recent Medical Services you may have received from other than [facility] providers in the past year (date may be approximate).     Assessment:   This is a routine wellness examination for [name].  Hearing/Vision screen Hearing Screening - Comments:: Denies any hearing issues  Vision Screening - Comments:: Pt follows up with Dr Gershon Crane for annual eye exams   Dietary issues and exercise activities discussed: Current Exercise Habits: Home exercise routine, Type of exercise: Other - see comments (recumbent bike), Time (Minutes): 20, Frequency (Times/Week): 5, Weekly Exercise (Minutes/Week): 100   Goals Addressed             This Visit's Progress    Patient Stated       Live a healthy life        Depression Screen    03/19/2022    8:46 AM 03/06/2021    8:52 AM 11/30/2020   11:14 AM 11/19/2019   10:21 AM 09/14/2019    9:46 AM 11/18/2018    2:01 PM 10/04/2017   12:55 PM  PHQ 2/9 Scores  PHQ - 2 Score 0 0 0 0 0 0 0    Fall Risk    03/19/2022    8:48 AM 03/06/2021    8:55 AM 11/30/2020   11:14 AM  01/29/2020    1:18 PM 12/08/2019    8:27 AM  [place] in the past year? 0 0 0 0 0   Number falls in past yr: 0 0 0 0 0  Injury with Fall? 0 0 0 0 0  Risk for fall due to :  Impaired vision     Follow up Falls prevention discussed Falls prevention discussed       FALL RISK PREVENTION PERTAINING TO THE HOME:  Any stairs in or around the home? Yes  If so, are there any without handrails? No  Home free of loose throw rugs in walkways, pet beds, electrical cords, etc? Yes  Adequate lighting in your home to reduce risk of falls? Yes   ASSISTIVE DEVICES UTILIZED TO PREVENT FALLS:  Life alert? No  Use of a cane, walker or w/c? No  Grab bars in the bathroom? Yes  Shower chair or bench in shower? No  Elevated toilet seat or a handicapped toilet? No   TIMED UP AND GO:  Was the test performed? No .  Cognitive Function:        03/19/2022    8:49 AM 03/06/2021    8:57 AM 11/19/2019   10:21 AM  6CIT Screen  What Year? 0 points 0 points 0 points  What month? 0 points 0 points 0 points  What time? 0 points 0 points 0 points  Count back from 20 0 points 0 points 0 points  Months in reverse 0 points 0 points 0 points  Repeat phrase 0 points 0 points 0 points  Total Score 0 points 0 points 0 points    Immunizations Immunization History  Administered Date(s) Administered   Fluad Quad(high Dose 65+) 04/25/2021   Influenza Whole 04/25/2009, 06/21/2010, 07/01/2011   Influenza, High Dose Seasonal PF 04/22/2014, 06/10/2017, 05/06/2018, 05/12/2020   Influenza,inj,Quad PF,6+ Mos 06/17/2013   Influenza-Unspecified 06/20/2000, 05/13/2002, 06/08/2003, 06/06/2004, 07/13/2006, 04/25/2011, 06/17/2013, 04/22/2014, 05/22/2016, 06/10/2017, 05/01/2019   Moderna Sars-Covid-2 Vaccination 08/19/2019, 09/16/2019, 04/08/2020   Pneumococcal Conjugate-13 12/28/2013   Pneumococcal Polysaccharide-23 10/22/2007   Pneumococcal-Unspecified 04/07/2001   Td 12/05/2009    TDAP status: Due, Education has been provided regarding the importance of this vaccine. Advised may receive this vaccine at  local pharmacy or Health Dept. Aware to provide a copy of the vaccination record if obtained from local pharmacy or Health Dept. Verbalized acceptance and understanding.  Flu Vaccine status: Up to date  Pneumococcal vaccine status: Up to date  Covid-19 vaccine status: Completed vaccines  Qualifies for Shingles Vaccine? Yes   Zostavax completed No   Shingrix Completed?: No.    Education has been provided regarding the importance of this vaccine. Patient has been advised to call insurance company to determine out of pocket expense if they have not yet received this vaccine. Advised may also receive vaccine at local pharmacy or Health Dept. Verbalized acceptance and understanding.  Screening Tests Health Maintenance  Topic Date Due   Zoster Vaccines- Shingrix (1 of 2) Never done   TETANUS/TDAP  12/06/2019   COVID-19 Vaccine (4 - Booster for Moderna series) 06/03/2020   INFLUENZA VACCINE  03/13/2022   Pneumonia Vaccine 55+ Years old  Completed   HPV VACCINES  Aged Out    Health Maintenance  Health Maintenance Due  Topic Date Due   Zoster Vaccines- Shingrix (1 of 2) Never done   TETANUS/TDAP  12/06/2019   COVID-19 Vaccine (4 - Booster for Moderna series) 06/03/2020   INFLUENZA VACCINE  03/13/2022  Colorectal cancer screening: No longer required.    Additional Screening:   Vision Screening: Recommended annual ophthalmology exams for early detection of glaucoma and other disorders of the eye. Is the patient up to date with their annual eye exam?  Yes  Who is the provider or what is the name of the office in which the patient attends annual eye exams? Dr Gershon Crane If pt is not established with a provider, would they like to be referred to a provider to establish care? No .   Dental Screening: Recommended annual dental exams for proper oral hygiene  Community Resource Referral / Chronic Care Management: CRR required this visit?  No   CCM required this visit?  No      Plan:      I have personally reviewed and noted the following in the patient's chart:   Medical and social history Use of alcohol, tobacco or illicit drugs  Current medications and supplements including opioid prescriptions. Patient is not currently taking opioid prescriptions. Functional ability and status Nutritional status Physical activity Advanced directives List of other physicians Hospitalizations, surgeries, and ER visits in previous 12 months Vitals Screenings to include cognitive, depression, and falls Referrals and appointments  In addition, I have reviewed and discussed with patient certain preventive protocols, quality metrics, and best practice recommendations. A written personalized care plan for preventive services as well as general preventive health recommendations were provided to patient.     Willette Brace, LPN   [id number]   Nurse Notes: None

## 2022-03-19 NOTE — Patient Instructions (Addendum)
[name] , Thank you for taking time to come for your Medicare Wellness Visit. I appreciate your ongoing commitment to your health goals. Please review the following plan we discussed and let me know if I can assist you in the future.   Screening recommendations/referrals: Colonoscopy: no longer required  Recommended yearly ophthalmology/optometry visit for glaucoma screening and checkup Recommended yearly dental visit for hygiene and checkup  Vaccinations: Influenza vaccine: Done 04/25/21 repeat every year  Pneumococcal vaccine: Up to date Tdap vaccine: due and discussed  Shingles vaccine: Shingrix discussed. Please contact your pharmacy for coverage information.    Covid-19: completed 1/6, 2/3, & 04/08/20  Advanced directives: Please bring a copy of your health care power of attorney and living will to the office at your convenience.  Conditions/risks identified: continue to live a healthy life   Next appointment: Follow up in one year for your annual wellness visit.   Preventive Care 47 Years and Older, Male Preventive care refers to lifestyle choices and visits with your health care provider that can promote health and wellness. What does preventive care include? A yearly physical exam. This is also called an annual well check. Dental exams once or twice a year. Routine eye exams. Ask your health care provider how often you should have your eyes checked. Personal lifestyle choices, including: Daily care of your teeth and gums. Regular physical activity. Eating a healthy diet. Avoiding tobacco and drug use. Limiting alcohol use. Practicing safe sex. Taking low doses of aspirin every day. Taking vitamin and mineral supplements as recommended by your health care provider. What happens during an annual well check? The services and screenings done by your health care provider during your annual well check will depend on your age, overall health, lifestyle risk factors, and family  history of disease. Counseling  Your health care provider may ask you questions about your: Alcohol use. Tobacco use. Drug use. Emotional well-being. Home and relationship well-being. Sexual activity. Eating habits. History of falls. Memory and ability to understand (cognition). Work and work Statistician. Screening  You may have the following tests or measurements: Height, weight, and BMI. Blood pressure. Lipid and cholesterol levels. These may be checked every 5 years, or more frequently if you are over 17 years old. Skin check. Lung cancer screening. You may have this screening every year starting at age 38 if you have a 30-pack-year history of smoking and currently smoke or have quit within the past 15 years. Fecal occult blood test (FOBT) of the stool. You may have this test every year starting at age 4. Flexible sigmoidoscopy or colonoscopy. You may have a sigmoidoscopy every 5 years or a colonoscopy every 10 years starting at age 68. Prostate cancer screening. Recommendations will vary depending on your family history and other risks. Hepatitis C blood test. Hepatitis B blood test. Sexually transmitted disease (STD) testing. Diabetes screening. This is done by checking your blood sugar (glucose) after you have not eaten for a while (fasting). You may have this done every 1-3 years. Abdominal aortic aneurysm (AAA) screening. You may need this if you are a current or former smoker. Osteoporosis. You may be screened starting at age 63 if you are at high risk. Talk with your health care provider about your test results, treatment options, and if necessary, the need for more tests. Vaccines  Your health care provider may recommend certain vaccines, such as: Influenza vaccine. This is recommended every year. Tetanus, diphtheria, and acellular pertussis (Tdap, Td) vaccine. You may  need a Td booster every 10 years. Zoster vaccine. You may need this after age 74. Pneumococcal  13-valent conjugate (PCV13) vaccine. One dose is recommended after age 58. Pneumococcal polysaccharide (PPSV23) vaccine. One dose is recommended after age 31. Talk to your health care provider about which screenings and vaccines you need and how often you need them. This information is not intended to replace advice given to you by your health care provider. Make sure you discuss any questions you have with your health care provider. Document Released: 08/26/2015 Document Revised: 04/18/2016 Document Reviewed: 05/31/2015 Elsevier Interactive Patient Education  2017 [place] Prevention in the Home Falls can cause injuries. They can happen to people of all ages. There are many things you can do to make your home safe and to help prevent falls. What can I do on the outside of my home? Regularly fix the edges of walkways and driveways and fix any cracks. Remove anything that might make you trip as you walk through a door, such as a raised step or threshold. Trim any bushes or trees on the path to your home. Use bright outdoor lighting. Clear any walking paths of anything that might make someone trip, such as rocks or tools. Regularly check to see if handrails are loose or broken. Make sure that both sides of any steps have handrails. Any raised decks and porches should have guardrails on the edges. Have any leaves, snow, or ice cleared regularly. Use sand or salt on walking paths during winter. Clean up any spills in your garage right away. This includes oil or grease spills. What can I do in the bathroom? Use night lights. Install grab bars by the toilet and in the tub and shower. Do not use towel bars as grab bars. Use non-skid mats or decals in the tub or shower. If you need to sit down in the shower, use a plastic, non-slip stool. Keep the floor dry. Clean up any water that spills on the floor as soon as it happens. Remove soap buildup in the tub or shower regularly. Attach  bath mats securely with double-sided non-slip rug tape. Do not have throw rugs and other things on the floor that can make you trip. What can I do in the bedroom? Use night lights. Make sure that you have a light by your bed that is easy to reach. Do not use any sheets or blankets that are too big for your bed. They should not hang down onto the floor. Have a firm chair that has side arms. You can use this for support while you get dressed. Do not have throw rugs and other things on the floor that can make you trip. What can I do in the kitchen? Clean up any spills right away. Avoid walking on wet floors. Keep items that you use a lot in easy-to-reach places. If you need to reach something above you, use a strong step stool that has a grab bar. Keep electrical cords out of the way. Do not use floor polish or wax that makes floors slippery. If you must use wax, use non-skid floor wax. Do not have throw rugs and other things on the floor that can make you trip. What can I do with my stairs? Do not leave any items on the stairs. Make sure that there are handrails on both sides of the stairs and use them. Fix handrails that are broken or loose. Make sure that handrails are as long as the stairways.  Check any carpeting to make sure that it is firmly attached to the stairs. Fix any carpet that is loose or worn. Avoid having throw rugs at the top or bottom of the stairs. If you do have throw rugs, attach them to the floor with carpet tape. Make sure that you have a light switch at the top of the stairs and the bottom of the stairs. If you do not have them, ask someone to add them for you. What else can I do to help prevent falls? Wear shoes that: Do not have high heels. Have rubber bottoms. Are comfortable and fit you well. Are closed at the toe. Do not wear sandals. If you use a stepladder: Make sure that it is fully opened. Do not climb a closed stepladder. Make sure that both sides of the  stepladder are locked into place. Ask someone to hold it for you, if possible. Clearly mark and make sure that you can see: Any grab bars or handrails. First and last steps. Where the edge of each step is. Use tools that help you move around (mobility aids) if they are needed. These include: Canes. Walkers. Scooters. Crutches. Turn on the lights when you go into a dark area. Replace any light bulbs as soon as they burn out. Set up your furniture so you have a clear path. Avoid moving your furniture around. If any of your floors are uneven, fix them. If there are any pets around you, be aware of where they are. Review your medicines with your doctor. Some medicines can make you feel dizzy. This can increase your chance of falling. Ask your doctor what other things that you can do to help prevent falls. This information is not intended to replace advice given to you by your health care provider. Make sure you discuss any questions you have with your health care provider. Document Released: 05/26/2009 Document Revised: 01/05/2016 Document Reviewed: 09/03/2014 Elsevier Interactive Patient Education  2017 [employer].  .

## 2022-03-22 NOTE — Progress Notes (Signed)
Virtual Visit via Telephone Note  I connected with  [name] on 03/22/22 at  8:45 AM EDT by telephone and verified that I am speaking with the correct person using two identifiers.  Medicare Annual Wellness visit completed telephonically due to Covid-19 pandemic.   Persons participating in this call: This Health Coach and this patient.   Location: Patient: home Provider: office    I discussed the limitations, risks, security and privacy concerns of performing an evaluation and management service by telephone and the availability of in person appointments. The patient expressed understanding and agreed to proceed.  Unable to perform video visit due to video visit attempted and failed and/or patient does not have video capability.   Some vital signs may be absent or patient reported.   Richard Brace, LPN   Subjective:   [name] is a 88 y.o. male who presents for Medicare Annual/Subsequent preventive examination.  Review of Systems     Cardiac Risk Factors include: advanced age (>73mn, >>50women);dyslipidemia;hypertension;male gender     Objective:    There were no vitals filed for this visit. There is no height or weight on file to calculate BMI.     03/19/2022    8:47 AM 03/06/2021    8:54 AM 11/19/2019   10:20 AM 07/04/2019    2:19 AM 11/21/2016   10:26 AM 11/14/2016   11:17 AM 05/31/2014   10:50 AM  Advanced Directives  Does Patient Have a Medical Advance Directive? Yes Yes Yes Yes Yes Yes Yes  Type of AParamedicof AGalvaLiving will HSparksLiving will Living will;Healthcare Power of Attorney Living will HEldredwill  Does patient want to make changes to medical advance directive?   No - Patient declined  No - Patient declined No - Patient declined   Copy of HTroutdalein Chart? No - copy requested No - copy requested No -  copy requested  No - copy requested No - copy requested     Current Medications (verified) Outpatient Encounter Medications as of 03/19/2022  Medication Sig   allopurinol (ZYLOPRIM) 300 MG tablet TAKE 1 TABLET DAILY   apixaban (ELIQUIS) 2.5 MG TABS tablet Take 1 tablet (2.5 mg total) by mouth 2 (two) times daily.   atorvastatin (LIPITOR) 40 MG tablet TAKE 1 TABLET DAILY   budesonide-formoterol (SYMBICORT) 80-4.5 MCG/ACT inhaler Inhale 2 puffs into the lungs in the morning and at bedtime.   diphenhydramine-acetaminophen (TYLENOL PM) 25-500 MG TABS tablet Take 1 tablet by mouth at bedtime as needed (sleep).   finasteride (PROSCAR) 5 MG tablet Take 5 mg by mouth daily.   furosemide (LASIX) 20 MG tablet TAKE 1 TABLET DAILY   metoprolol tartrate (LOPRESSOR) 25 MG tablet TAKE 1 TABLET TWICE A DAY   nitroGLYCERIN (NITROSTAT) 0.4 MG SL tablet Place 1 tablet (0.4 mg total) under the tongue every 5 (five) minutes as needed for chest pain.   ranolazine (RANEXA) 1000 MG SR tablet TAKE 1 TABLET TWICE A DAY   Tamsulosin HCl (FLOMAX) 0.4 MG CAPS Take 0.4 mg by mouth at bedtime.    No facility-administered encounter medications on file as of 03/19/2022.    Allergies (verified) Epinephrine, Penicillins, Lisinopril, and Novocain [procaine]   History: Past Medical History:  Diagnosis Date   Arthritis    "back, knees" (11/21/2016)   Bladder stones    CAD (coronary artery disease)    11/20/16  PCI with DES--> LAD/RCA   Chronic lower back pain    CKD (chronic kidney disease), stage IV ([name])    [name] 11/08/2016   History of gout    History of kidney stones 12/02/2008   Hyperlipidemia    Hypertension    Myocardial infarction [facility])    "silent" (11/21/2016)   PSA, INCREASED 12/05/2009   UTI (urinary tract infection)    Past Surgical History:  Procedure Laterality Date   CARDIAC CATHETERIZATION  11/14/2016   CATARACT EXTRACTION W/ INTRAOCULAR LENS  IMPLANT, BILATERAL     CORONARY ANGIOPLASTY WITH STENT  PLACEMENT  11/21/2016   "2 stents"    CORONARY ATHERECTOMY N/A 11/21/2016   Procedure: Coronary Atherectomy;  Surgeon: Richard Man, MD;  Location: [place] [facility];  Service: Cardiovascular;  Laterality: N/A;  RCA and LAD   CORONARY STENT INTERVENTION N/A 11/21/2016   Procedure: Coronary Stent Intervention;  Surgeon: Richard Man, MD;  Location: [place] [facility];  Service: Cardiovascular;  Laterality: N/A;  RCA and LAD   CYSTOSCOPY W/ STONE MANIPULATION     INTRAVASCULAR PRESSURE WIRE/FFR STUDY N/A 05/22/2019   Procedure: INTRAVASCULAR PRESSURE WIRE/FFR STUDY;  Surgeon: Richard Bush, MD;  Location: [place] [facility];  Service: Cardiovascular;  Laterality: N/A;   JOINT REPLACEMENT     KNEE ARTHROSCOPY Left    LAPAROSCOPIC CHOLECYSTECTOMY     LEFT HEART CATH N/A 11/21/2016   Procedure: Left Heart Cath;  Surgeon: Richard Man, MD;  Location: [place] [facility];  Service: Cardiovascular;  Laterality: N/A;   LITHOTRIPSY     PILONIDAL CYST EXCISION  58   RIGHT/LEFT HEART CATH AND CORONARY ANGIOGRAPHY N/A 11/14/2016   Procedure: Right/Left Heart Cath and Coronary Angiography;  Surgeon: Richard Man, MD;  Location: [place] [facility];  Service: Cardiovascular;  Laterality: N/A;   RIGHT/LEFT HEART CATH AND CORONARY ANGIOGRAPHY N/A 05/22/2019   Procedure: RIGHT/LEFT HEART CATH AND CORONARY ANGIOGRAPHY;  Surgeon: Richard Bush, MD;  Location: [place] [facility];  Service: Cardiovascular;  Laterality: N/A;   TEMPORARY PACEMAKER N/A 11/21/2016   Procedure: Temporary Pacemaker;  Surgeon: Richard Man, MD;  Location: [place] [facility];  Service: Cardiovascular;  Laterality: N/A;   TONSILLECTOMY     TOTAL KNEE ARTHROPLASTY Left 09/22/2012   Procedure: TOTAL KNEE ARTHROPLASTY;  Surgeon: Richard Junes, MD;  Location: [place];  Service: Orthopedics;  Laterality: Left;   Family History  Problem Relation Age of Onset   Breast cancer Mother    Heart attack Father        37, smoker    Social History   Socioeconomic History   Marital status: Married    Spouse name: Not on file   Number of children: Not on file   Years of education: Not on file   Highest education level: Not on file  Occupational History   Not on file  Tobacco Use   Smoking status: Never   Smokeless tobacco: Never  Vaping Use   Vaping Use: Never used  Substance and Sexual Activity   Alcohol use: Not Currently    Comment: 11/21/2016 "nothing since 2015"   Drug use: No   Sexual activity: Yes    Partners: Female  Other Topics Concern   Not on file  Social History Narrative   Married (Wife June patient of Dr. Yong Channel). 2 sons. 4 grandkids.    Originally from near [place], [place] at [employer] 6-7 months of the  year      Retired from [employer] long time. 27 years of service.       Hobbies: golf   Used to [employer], run [employer]   Social Determinants of [employer]: Low Risk  (03/19/2022)   Overall Financial Resource Strain (CARDIA)    Difficulty of Paying Living Expenses: Not hard at all  Food Insecurity: No Food Insecurity (03/19/2022)   Hunger Vital Sign    Worried About Running Out of Food in the Last Year: Never true    Ran Out of Food in the Last Year: Never true  Transportation Needs: No Transportation Needs (03/19/2022)   PRAPARE - Hydrologist (Medical): No    Lack of Transportation (Non-Medical): No  Physical Activity: Insufficiently Active (03/19/2022)   Exercise Vital Sign    Days of Exercise per Week: 5 days    Minutes of Exercise per Session: 20 min  Stress: Stress Concern Present (03/19/2022)   [place]    Feeling of Stress : To some extent  Social Connections: Moderately Isolated (03/19/2022)   Social Connection and Isolation Panel [NHANES]    Frequency of Communication with Friends and Family: More than three times a week    Frequency of  Social Gatherings with Friends and Family: More than three times a week    Attends Religious Services: Never    Marine scientist or Organizations: No    Attends Music therapist: Never    Marital Status: Married    Tobacco Counseling Counseling given: Not Answered   Clinical Intake:  Pre-visit preparation completed: Yes  Pain : No/denies pain     Nutritional Risks: None Diabetes: No  How often do you need to have someone help you when you read instructions, pamphlets, or other written materials from your doctor or pharmacy?: 1 - Never  Diabetic?no  Interpreter Needed?: No  Information entered by :: Charlott Rakes , LPN   Activities of Daily Living    03/19/2022    8:48 AM  In your present state of health, do you have any difficulty performing the following activities:  Hearing? 0  Vision? 0  Difficulty concentrating or making decisions? 0  Walking or climbing stairs? 1  Comment SOB  Dressing or bathing? 0  Doing errands, shopping? 0  Preparing Food and eating ? N  Using the Toilet? N  In the past six months, have you accidently leaked urine? N  Do you have problems with loss of bowel control? N  Managing your Medications? N  Managing your Finances? N  Housekeeping or managing your Housekeeping? N    Patient Care Team: Marin Olp, MD as PCP - General (Family Medicine) Constance Haw, MD as PCP - Electrophysiology (Cardiology) Jerline Pain, MD as PCP - Cardiology (Cardiology) Deterding, Jeneen Rinks, MD as Consulting Physician (Nephrology) [facility] as Consulting Physician (Ophthalmology) Alyson Ingles [name], MD as Consulting Physician (Urology)  Indicate any recent Medical Services you may have received from other than [facility] providers in the past year (date may be approximate).     Assessment:   This is a routine wellness examination for Elson.  Hearing/Vision screen Hearing Screening - Comments:: Denies any hearing  issues  Vision Screening - Comments:: Pt follows up with Dr Gershon Crane for annual eye exams   Dietary issues and exercise activities discussed: Current Exercise Habits: Home exercise routine, Type of exercise:  Other - see comments (recumbent bike), Time (Minutes): 20, Frequency (Times/Week): 5, Weekly Exercise (Minutes/Week): 100   Goals Addressed             This Visit's Progress    Patient Stated       Live a healthy life       Depression Screen    03/19/2022    8:46 AM 03/06/2021    8:52 AM 11/30/2020   11:14 AM 11/19/2019   10:21 AM 09/14/2019    9:46 AM 11/18/2018    2:01 PM 10/04/2017   12:55 PM  PHQ 2/9 Scores  PHQ - 2 Score 0 0 0 0 0 0 0    Fall Risk    03/19/2022    8:48 AM 03/06/2021    8:55 AM 11/30/2020   11:14 AM 01/29/2020    1:18 PM 12/08/2019    8:27 AM  Fall Risk   Falls in the past year? 0 0 0 0 0  Number falls in past yr: 0 0 0 0 0  Injury with Fall? 0 0 0 0 0  Risk for fall due to :  Impaired vision     Follow up Falls prevention discussed Falls prevention discussed       FALL RISK PREVENTION PERTAINING TO THE HOME:  Any stairs in or around the home? Yes  If so, are there any without handrails? No  Home free of loose throw rugs in walkways, pet beds, electrical cords, etc? Yes  Adequate lighting in your home to reduce risk of falls? Yes   ASSISTIVE DEVICES UTILIZED TO PREVENT FALLS:  Life alert? No  Use of a cane, walker or w/c? No  Grab bars in the bathroom? Yes  Shower chair or bench in shower? No  Elevated toilet seat or a handicapped toilet? No   TIMED UP AND GO:  Was the test performed? No .  Cognitive Function:        03/19/2022    8:49 AM 03/06/2021    8:57 AM 11/19/2019   10:21 AM  6CIT Screen  What Year? 0 points 0 points 0 points  What month? 0 points 0 points 0 points  What time? 0 points 0 points 0 points  Count back from 20 0 points 0 points 0 points  Months in reverse 0 points 0 points 0 points  Repeat phrase 0 points 0 points 0  points  Total Score 0 points 0 points 0 points    Immunizations Immunization History  Administered Date(s) Administered   Fluad Quad(high Dose 65+) 04/25/2021   Influenza Whole 04/25/2009, 06/21/2010, 07/01/2011   Influenza, High Dose Seasonal PF 04/22/2014, 06/10/2017, 05/06/2018, 05/12/2020   Influenza,inj,Quad PF,6+ Mos 06/17/2013   Influenza-Unspecified 06/20/2000, 05/13/2002, 06/08/2003, 06/06/2004, 07/13/2006, 04/25/2011, 06/17/2013, 04/22/2014, 05/22/2016, 06/10/2017, 05/01/2019   Moderna Sars-Covid-2 Vaccination 08/19/2019, 09/16/2019, 04/08/2020   Pneumococcal Conjugate-13 12/28/2013   Pneumococcal Polysaccharide-23 10/22/2007   Pneumococcal-Unspecified 04/07/2001   Td 12/05/2009    TDAP status: Due, Education has been provided regarding the importance of this vaccine. Advised may receive this vaccine at local pharmacy or Health Dept. Aware to provide a copy of the vaccination record if obtained from local pharmacy or Health Dept. Verbalized acceptance and understanding.  Flu Vaccine status: Up to date  Pneumococcal vaccine status: Up to date  Covid-19 vaccine status: Completed vaccines  Qualifies for Shingles Vaccine? Yes   Zostavax completed No   Shingrix Completed?: No.    Education has been provided regarding the importance of this  vaccine. Patient has been advised to call insurance company to determine out of pocket expense if they have not yet received this vaccine. Advised may also receive vaccine at local pharmacy or Health Dept. Verbalized acceptance and understanding.  Screening Tests Health Maintenance  Topic Date Due   Zoster Vaccines- Shingrix (1 of 2) Never done   TETANUS/TDAP  12/06/2019   COVID-19 Vaccine (4 - Moderna risk series) 06/03/2020   INFLUENZA VACCINE  03/13/2022   Pneumonia Vaccine 63+ Years old  Completed   HPV VACCINES  Aged Out    Health Maintenance  Health Maintenance Due  Topic Date Due   Zoster Vaccines- Shingrix (1 of 2) Never  done   TETANUS/TDAP  12/06/2019   COVID-19 Vaccine (4 - Moderna risk series) 06/03/2020   INFLUENZA VACCINE  03/13/2022    Colorectal cancer screening: No longer required.    Additional Screening:   Vision Screening: Recommended annual ophthalmology exams for early detection of glaucoma and other disorders of the eye. Is the patient up to date with their annual eye exam?  Yes  Who is the provider or what is the name of the office in which the patient attends annual eye exams? Dr Gershon Crane If pt is not established with a provider, would they like to be referred to a provider to establish care? No .   Dental Screening: Recommended annual dental exams for proper oral hygiene  Community Resource Referral / Chronic Care Management: CRR required this visit?  No   CCM required this visit?  No      Plan:     I have personally reviewed and noted the following in the patient's chart:   Medical and social history Use of alcohol, tobacco or illicit drugs  Current medications and supplements including opioid prescriptions. Patient is not currently taking opioid prescriptions. Functional ability and status Nutritional status Physical activity Advanced directives List of other physicians Hospitalizations, surgeries, and ER visits in previous 12 months Vitals Screenings to include cognitive, depression, and falls Referrals and appointments  In addition, I have reviewed and discussed with patient certain preventive protocols, quality metrics, and best practice recommendations. A written personalized care plan for preventive services as well as general preventive health recommendations were provided to patient.     Richard Brace, LPN   [id number]   Nurse Notes: None

## 2022-04-17 ENCOUNTER — Ambulatory Visit (INDEPENDENT_AMBULATORY_CARE_PROVIDER_SITE_OTHER): Payer: Medicare Other

## 2022-04-17 DIAGNOSIS — I495 Sick sinus syndrome: Secondary | ICD-10-CM | POA: Diagnosis not present

## 2022-04-24 LAB — CUP PACEART REMOTE DEVICE CHECK
Battery Remaining Longevity: 77 mo
Battery Voltage: 2.91 V
Brady Statistic AP VP Percent: 16.96 %
Brady Statistic AP VS Percent: 82.85 %
Brady Statistic AS VP Percent: 0.04 %
Brady Statistic AS VS Percent: 0.16 %
Brady Statistic RA Percent Paced: 99.76 %
Brady Statistic RV Percent Paced: 16.99 %
Date Time Interrogation Session: 20230903234135
Implantable Lead Implant Date: 20190116
Implantable Lead Implant Date: 20190116
Implantable Lead Location: 753859
Implantable Lead Location: 753860
Implantable Lead Model: 5076
Implantable Lead Model: 5076
Implantable Pulse Generator Implant Date: 20190116
Lead Channel Impedance Value: 285 Ohm
Lead Channel Impedance Value: 304 Ohm
Lead Channel Impedance Value: 342 Ohm
Lead Channel Impedance Value: 456 Ohm
Lead Channel Pacing Threshold Amplitude: 0.875 V
Lead Channel Pacing Threshold Amplitude: 1.25 V
Lead Channel Pacing Threshold Pulse Width: 0.4 ms
Lead Channel Pacing Threshold Pulse Width: 0.4 ms
Lead Channel Sensing Intrinsic Amplitude: 0.375 mV
Lead Channel Sensing Intrinsic Amplitude: 0.375 mV
Lead Channel Sensing Intrinsic Amplitude: 6.125 mV
Lead Channel Sensing Intrinsic Amplitude: 6.125 mV
Lead Channel Setting Pacing Amplitude: 2 V
Lead Channel Setting Pacing Amplitude: 2.5 V
Lead Channel Setting Pacing Pulse Width: 0.4 ms
Lead Channel Setting Sensing Sensitivity: 1.2 mV

## 2022-05-07 ENCOUNTER — Encounter: Payer: Self-pay | Admitting: *Deleted

## 2022-05-09 NOTE — Progress Notes (Signed)
Remote pacemaker transmission.   

## 2022-06-04 DIAGNOSIS — R3914 Feeling of incomplete bladder emptying: Secondary | ICD-10-CM | POA: Diagnosis not present

## 2022-06-04 DIAGNOSIS — N401 Enlarged prostate with lower urinary tract symptoms: Secondary | ICD-10-CM | POA: Diagnosis not present

## 2022-06-11 ENCOUNTER — Other Ambulatory Visit: Payer: Self-pay | Admitting: Physician Assistant

## 2022-06-11 DIAGNOSIS — I2782 Chronic pulmonary embolism: Secondary | ICD-10-CM

## 2022-06-11 NOTE — Telephone Encounter (Addendum)
Prescription refill request for Eliquis received. Indication: PE Last office visit:10/26/21 ([place])  Scr: 1.76 (04/28/21)  Age: 88 Weight: 110kg  Labs overdue. Called pt, no answer, left message on voicemail with call back number to schedule lab appt. Sent 90 day supply to requested pharmacy to prevent any missed doses.

## 2022-06-13 ENCOUNTER — Encounter: Payer: Self-pay | Admitting: Family Medicine

## 2022-07-17 ENCOUNTER — Ambulatory Visit (INDEPENDENT_AMBULATORY_CARE_PROVIDER_SITE_OTHER): Payer: Medicare Other

## 2022-07-17 DIAGNOSIS — I495 Sick sinus syndrome: Secondary | ICD-10-CM

## 2022-07-17 DIAGNOSIS — I2510 Atherosclerotic heart disease of native coronary artery without angina pectoris: Secondary | ICD-10-CM | POA: Diagnosis not present

## 2022-07-17 DIAGNOSIS — S0031XA Abrasion of nose, initial encounter: Secondary | ICD-10-CM | POA: Diagnosis not present

## 2022-07-17 DIAGNOSIS — I10 Essential (primary) hypertension: Secondary | ICD-10-CM | POA: Diagnosis not present

## 2022-07-17 DIAGNOSIS — R58 Hemorrhage, not elsewhere classified: Secondary | ICD-10-CM | POA: Diagnosis not present

## 2022-07-17 DIAGNOSIS — I129 Hypertensive chronic kidney disease with stage 1 through stage 4 chronic kidney disease, or unspecified chronic kidney disease: Secondary | ICD-10-CM | POA: Diagnosis not present

## 2022-07-17 DIAGNOSIS — Z86718 Personal history of other venous thrombosis and embolism: Secondary | ICD-10-CM | POA: Diagnosis not present

## 2022-07-17 DIAGNOSIS — S0990XA Unspecified injury of head, initial encounter: Secondary | ICD-10-CM | POA: Diagnosis not present

## 2022-07-17 DIAGNOSIS — S0083XA Contusion of other part of head, initial encounter: Secondary | ICD-10-CM | POA: Diagnosis not present

## 2022-07-17 DIAGNOSIS — E785 Hyperlipidemia, unspecified: Secondary | ICD-10-CM | POA: Diagnosis not present

## 2022-07-17 DIAGNOSIS — Z950 Presence of cardiac pacemaker: Secondary | ICD-10-CM | POA: Diagnosis not present

## 2022-07-17 DIAGNOSIS — Z87442 Personal history of urinary calculi: Secondary | ICD-10-CM | POA: Diagnosis not present

## 2022-07-17 DIAGNOSIS — W19XXXA Unspecified fall, initial encounter: Secondary | ICD-10-CM | POA: Diagnosis not present

## 2022-07-17 DIAGNOSIS — S81019A Laceration without foreign body, unspecified knee, initial encounter: Secondary | ICD-10-CM | POA: Diagnosis not present

## 2022-07-17 DIAGNOSIS — S00211A Abrasion of right eyelid and periocular area, initial encounter: Secondary | ICD-10-CM | POA: Diagnosis not present

## 2022-07-17 DIAGNOSIS — S61512A Laceration without foreign body of left wrist, initial encounter: Secondary | ICD-10-CM | POA: Diagnosis not present

## 2022-07-17 DIAGNOSIS — S20212A Contusion of left front wall of thorax, initial encounter: Secondary | ICD-10-CM | POA: Diagnosis not present

## 2022-07-17 DIAGNOSIS — Z7901 Long term (current) use of anticoagulants: Secondary | ICD-10-CM | POA: Diagnosis not present

## 2022-07-17 DIAGNOSIS — N189 Chronic kidney disease, unspecified: Secondary | ICD-10-CM | POA: Diagnosis not present

## 2022-07-17 DIAGNOSIS — S299XXA Unspecified injury of thorax, initial encounter: Secondary | ICD-10-CM | POA: Diagnosis not present

## 2022-07-17 DIAGNOSIS — S0081XA Abrasion of other part of head, initial encounter: Secondary | ICD-10-CM | POA: Diagnosis not present

## 2022-07-17 DIAGNOSIS — S199XXA Unspecified injury of neck, initial encounter: Secondary | ICD-10-CM | POA: Diagnosis not present

## 2022-07-17 DIAGNOSIS — S0993XA Unspecified injury of face, initial encounter: Secondary | ICD-10-CM | POA: Diagnosis not present

## 2022-07-17 LAB — CUP PACEART REMOTE DEVICE CHECK
Battery Remaining Longevity: 73 mo
Battery Voltage: 2.9 V
Brady Statistic AP VP Percent: 28.15 %
Brady Statistic AP VS Percent: 71.62 %
Brady Statistic AS VP Percent: 0.03 %
Brady Statistic AS VS Percent: 0.21 %
Brady Statistic RA Percent Paced: 99.77 %
Brady Statistic RV Percent Paced: 28.18 %
Date Time Interrogation Session: 20231204201603
Implantable Lead Connection Status: 753985
Implantable Lead Connection Status: 753985
Implantable Lead Implant Date: 20190116
Implantable Lead Implant Date: 20190116
Implantable Lead Location: 753859
Implantable Lead Location: 753860
Implantable Lead Model: 5076
Implantable Lead Model: 5076
Implantable Pulse Generator Implant Date: 20190116
Lead Channel Impedance Value: 285 Ohm
Lead Channel Impedance Value: 304 Ohm
Lead Channel Impedance Value: 342 Ohm
Lead Channel Impedance Value: 456 Ohm
Lead Channel Pacing Threshold Amplitude: 1 V
Lead Channel Pacing Threshold Amplitude: 1.25 V
Lead Channel Pacing Threshold Pulse Width: 0.4 ms
Lead Channel Pacing Threshold Pulse Width: 0.4 ms
Lead Channel Sensing Intrinsic Amplitude: 0.25 mV
Lead Channel Sensing Intrinsic Amplitude: 0.25 mV
Lead Channel Sensing Intrinsic Amplitude: 7.25 mV
Lead Channel Sensing Intrinsic Amplitude: 7.25 mV
Lead Channel Setting Pacing Amplitude: 2 V
Lead Channel Setting Pacing Amplitude: 2.5 V
Lead Channel Setting Pacing Pulse Width: 0.4 ms
Lead Channel Setting Sensing Sensitivity: 1.2 mV
Zone Setting Status: 755011
Zone Setting Status: 755011

## 2022-07-19 DIAGNOSIS — S0083XA Contusion of other part of head, initial encounter: Secondary | ICD-10-CM | POA: Diagnosis not present

## 2022-07-19 DIAGNOSIS — T148XXA Other injury of unspecified body region, initial encounter: Secondary | ICD-10-CM | POA: Diagnosis not present

## 2022-07-19 DIAGNOSIS — W19XXXA Unspecified fall, initial encounter: Secondary | ICD-10-CM | POA: Diagnosis not present

## 2022-07-21 ENCOUNTER — Encounter: Payer: Self-pay | Admitting: Physician Assistant

## 2022-07-21 NOTE — Progress Notes (Unsigned)
Cardiology Office Note    Date:  07/21/2022   ID:  [name], DOB 25-Feb-1934, MRN [id number]  PCP:  Marin Olp, MD  Cardiologist:  [name], MD  Electrophysiologist:  Constance Haw, MD   Chief Complaint: ***  History of Present Illness:   [name] is a 88 y.o. male with history of CAD, moderate AS, mild AI, mild MR, moderate pulm HTN by echo 10/2021. CKD stage 3b (listed as stage IV in prior notes), HTN, HLD, SSS s/p MDT PPM 2019, remote brief PAF by EP note, dilated aortic root, NSVT, LLE DVT/PE in 2018 with recurrent DVT in 11/2020 per PCP notes (while off Eliquis x 3 days), mixed obstructive/restrictive pulmonary disease  with elevated R hemidiaphragm seen for evaluation of chest pain.  He had prior DES to RCA and LAD in 2018. Last cath 2020 showed 60% ISR with borderline DFR, mild to moderate, non-obstructive coronary artery disease involving the LAD and LCx; LAD stent is widely patent, Mildly elevated left heart, right heart, and pulmonary artery pressures,  Mild to moderate aortic valve stenosis, mildly reduced Fick cardiac output/index. Medical therapy was recommended, reserving PCI to RCA ISR if he had symptoms despite at least 2 antianginal agents. He also has hx of DVT/PE in 2018 at outside facility. In 11/2020 he was admitted to outside facility with hypoxia/CAP. CTA was negative for PE. He had called PCP a few days after DC with worsening leg swelling and had a repeat LE duplex (under CareEverywhere) positive for L DVT - had been reportedly off Eliquis for 3 days during hospitalization. The dose was later reduced by EP APP due to Cr >1.5. EP notes also comment on presence of prior PAF all very brief. His anticoagulation appears to be primarily for his VTE hx. He has also seen pulmonology for multifactorial dyspnea, last OV 08/2021 with recommendation for 6 month f/u. Last echo 10/2021 showed EF 60-65%, mild LVH, G2DD, moderately elevated PASP, mild MR, moderate AS,  mild AI, mild dilation of ascending aorta, dilated IVC.  pCP last yr Pulm 08/2021 due 02/2022 EP f/u 09/2022 Eliquis dose  Dilated aorta Basic labs updated Get echo  Precordial pain CAD SSS s/p MDT PPM 2019, remote brief PAF and NSVT by EP notes History of recurrent VTE Valvular heart disease with mild AI, mild MR, moderate AS, mild dilation of ascending aorta Moderate pulm HTN  Labwork independently reviewed: 07/17/22 Cr 1.63, K 4.3, Hgb 13.9, plt 135 04/2021 LFTs ok 02/2021 LDL 58  Cardiology Studies:   Studies reviewed are outlined and summarized above. Reports may be included below. Otherwise see EMR.  Echo 10/2021    1. Left ventricular ejection fraction, by estimation, is 60 to 65%. Left  ventricular ejection fraction by PLAX is 65 %. The left ventricle has  normal function. The left ventricle has no regional wall motion  abnormalities. There is mild concentric left  ventricular hypertrophy. Left ventricular diastolic parameters are  consistent with Grade II diastolic dysfunction (pseudonormalization).   2. Right ventricular systolic function is normal. The right ventricular  size is normal. There is moderately elevated pulmonary artery systolic  pressure.   3. Left atrial size was mildly dilated.   4. The mitral valve is normal in structure. Mild mitral valve  regurgitation. No evidence of mitral stenosis. Moderate mitral annular  calcification.   5. Aortic valve gradients unchanged since 2021. The aortic valve is  tricuspid. There is mild calcification of the aortic valve.  There is mild  thickening of the aortic valve. Aortic valve regurgitation is mild.  Moderate aortic valve stenosis. Aortic  regurgitation PHT measures 582 msec. Aortic valve area, by VTI measures  0.81 cm. Aortic valve mean gradient measures 21.8 mmHg. Aortic valve Vmax  measures 2.95 m/s.   6. Aortic dilatation noted. There is mild dilatation of the ascending  aorta, measuring 39 mm.   7. The  inferior vena cava is dilated in size with >50% respiratory  variability, suggesting right atrial pressure of 8 mmHg.   R/LHC 2020  Significant single-vessel coronary artery disease with 60% in-stent restenosis of proximal RCA stent that is mildly abnormal by hemodynamic assessment (DFR 0.89; abnormal if less than 0.90). Mild to moderate, non-obstructive coronary artery disease involving the LAD and LCx; LAD stent is widely patent. Mildly elevated left heart, right heart, and pulmonary artery pressures. Mild to moderate aortic valve stenosis. Mildly reduced Fick cardiac output/index.   Recommendations: Optimize medical therapy, including aggressive antianginal therapy.  We will add isosorbide mononitrate 30 mg daily, to be escalated as tolerated. If symptoms persist/worsen despite maximal tolerated doses of at least two antianginal agents, PCI to proximal RCA in-stent restenosis will need to be considered. Aggressive secondary prevention. Restart apixaban tomorrow morning if no evidence of bleeding or vascular injury at catheterization sites.   Nelva Bush, MD [facility] Pager: [phone]       Past Medical History:  Diagnosis Date   Arthritis    "back, knees" (11/21/2016)   Bladder stones    CAD (coronary artery disease)    11/20/16 PCI with DES--> LAD/RCA   Chronic lower back pain    CKD (chronic kidney disease), stage IV ([place])    [name] 11/08/2016   History of gout    History of kidney stones 12/02/2008   Hyperlipidemia    Hypertension    Myocardial infarction [facility])    "silent" (11/21/2016)   PSA, INCREASED 12/05/2009   UTI (urinary tract infection)     Past Surgical History:  Procedure Laterality Date   CARDIAC CATHETERIZATION  11/14/2016   CATARACT EXTRACTION W/ INTRAOCULAR LENS  IMPLANT, BILATERAL     CORONARY ANGIOPLASTY WITH STENT PLACEMENT  11/21/2016   "2 stents"    CORONARY ATHERECTOMY N/A 11/21/2016   Procedure: Coronary Atherectomy;  Surgeon:  Leonie Man, MD;  Location: [place] [facility];  Service: Cardiovascular;  Laterality: N/A;  RCA and LAD   CORONARY STENT INTERVENTION N/A 11/21/2016   Procedure: Coronary Stent Intervention;  Surgeon: Leonie Man, MD;  Location: [place] [facility];  Service: Cardiovascular;  Laterality: N/A;  RCA and LAD   CYSTOSCOPY W/ STONE MANIPULATION     INTRAVASCULAR PRESSURE WIRE/FFR STUDY N/A 05/22/2019   Procedure: INTRAVASCULAR PRESSURE WIRE/FFR STUDY;  Surgeon: Nelva Bush, MD;  Location: [place] [facility];  Service: Cardiovascular;  Laterality: N/A;   JOINT REPLACEMENT     KNEE ARTHROSCOPY Left    LAPAROSCOPIC CHOLECYSTECTOMY     LEFT HEART CATH N/A 11/21/2016   Procedure: Left Heart Cath;  Surgeon: Leonie Man, MD;  Location: [place] [facility];  Service: Cardiovascular;  Laterality: N/A;   LITHOTRIPSY     PILONIDAL CYST EXCISION  56   RIGHT/LEFT HEART CATH AND CORONARY ANGIOGRAPHY N/A 11/14/2016   Procedure: Right/Left Heart Cath and Coronary Angiography;  Surgeon: Leonie Man, MD;  Location: [place] [facility];  Service: Cardiovascular;  Laterality: N/A;   RIGHT/LEFT HEART CATH AND CORONARY ANGIOGRAPHY N/A 05/22/2019  Procedure: RIGHT/LEFT HEART CATH AND CORONARY ANGIOGRAPHY;  Surgeon: Nelva Bush, MD;  Location: [place] [facility];  Service: Cardiovascular;  Laterality: N/A;   TEMPORARY PACEMAKER N/A 11/21/2016   Procedure: Temporary Pacemaker;  Surgeon: Leonie Man, MD;  Location: [place] [facility];  Service: Cardiovascular;  Laterality: N/A;   TONSILLECTOMY     TOTAL KNEE ARTHROPLASTY Left 09/22/2012   Procedure: TOTAL KNEE ARTHROPLASTY;  Surgeon: Lorn Junes, MD;  Location: [place];  Service: Orthopedics;  Laterality: Left;    Current Medications: No outpatient medications have been marked as taking for the 07/23/22 encounter (Appointment) with Charlie Pitter, PA-C.   ***   Allergies:   Epinephrine, Penicillins, Lisinopril, and Novocain [procaine]   Social  History   Socioeconomic History   Marital status: Married    Spouse name: Not on file   Number of children: Not on file   Years of education: Not on file   Highest education level: Not on file  Occupational History   Not on file  Tobacco Use   Smoking status: Never   Smokeless tobacco: Never  Vaping Use   Vaping Use: Never used  Substance and Sexual Activity   Alcohol use: Not Currently    Comment: 11/21/2016 "nothing since 2015"   Drug use: No   Sexual activity: Yes    Partners: Female  Other Topics Concern   Not on file  Social History Narrative   Married (Wife June patient of Dr. Yong Channel). 2 sons. 4 grandkids.    Originally from [place] [employer] 6[place]7 months of the year      Retired from [employer] long time. 27 years of service.       Hobbies: golf   Used to [employer], run [employer]   Social Determinants of [employer]: Low Risk  (03/19/2022)   Overall Financial Resource Strain (CARDIA)    Difficulty of Paying Living Expenses: Not hard at all  Food Insecurity: No Food Insecurity (03/19/2022)   Hunger Vital Sign    Worried About Running Out of Food in the Last Year: Never true    Ran Out of Food in the Last Year: Never true  Transportation Needs: No Transportation Needs (03/19/2022)   PRAPARE - Hydrologist (Medical): No    Lack of Transportation (Non-Medical): No  Physical Activity: Insufficiently Active (03/19/2022)   Exercise Vital Sign    Days of Exercise per Week: 5 days    Minutes of Exercise per Session: 20 min  Stress: Stress Concern Present (03/19/2022)   [place]    Feeling of Stress : To some extent  Social Connections: Moderately Isolated (03/19/2022)   Social Connection and Isolation Panel [NHANES]    Frequency of Communication with Friends and Family: More than three times a week    Frequency of Social  Gatherings with Friends and Family: More than three times a week    Attends Religious Services: Never    Marine scientist or Organizations: No    Attends Music therapist: Never    Marital Status: Married     Family History:  The patient's ***family history includes Breast cancer in his mother; Heart attack in his father.  ROS:   Please see the history of present illness. Otherwise, review of systems is positive for ***.  All other systems are reviewed and otherwise negative.  EKG(s)/Additional Labs   EKG:  EKG is ordered today, personally reviewed, demonstrating ***  Recent Labs: No results found for requested labs within last 365 days.  Recent Lipid Panel    Component Value Date/Time   CHOL 122 11/30/2020 1215   TRIG 106.0 11/30/2020 1215   HDL 42.20 11/30/2020 1215   CHOLHDL 3 11/30/2020 1215   VLDL 21.2 11/30/2020 1215   LDLCALC 58 11/30/2020 1215   LDLDIRECT 79.0 12/08/2014 0905    PHYSICAL EXAM:    VS:  There were no vitals taken for this visit.  BMI: There is no height or weight on file to calculate BMI.  GEN: Well nourished, well developed male in no acute distress HEENT: normocephalic, atraumatic Neck: no JVD, carotid bruits, or masses Cardiac: ***RRR; no murmurs, rubs, or gallops, no edema  Respiratory:  clear to auscultation bilaterally, normal work of breathing GI: soft, nontender, nondistended, + BS MS: no deformity or atrophy Skin: warm and dry, no rash Neuro:  Alert and Oriented x 3, Strength and sensation are intact, follows commands Psych: euthymic mood, full affect  Wt Readings from Last 3 Encounters:  10/26/21 244 lb (110.7 kg)  10/06/21 242 lb (109.8 kg)  09/06/21 240 lb 9.6 oz (109.1 kg)     ASSESSMENT & PLAN:   ***     Disposition: F/u with ***   Medication Adjustments/Labs and Tests Ordered: Current medicines are reviewed at length with the patient today.  Concerns regarding medicines are outlined above.  Medication changes, Labs and Tests ordered today are summarized above and listed in the Patient Instructions accessible in Encounters.   Signed, Charlie Pitter, PA-C  07/21/2022 9:11 AM    [place] Phone: [phone]; Fax: [phone]

## 2022-07-23 ENCOUNTER — Encounter: Payer: Self-pay | Admitting: Physician Assistant

## 2022-07-23 ENCOUNTER — Ambulatory Visit: Payer: Medicare Other | Attending: Physician Assistant | Admitting: Physician Assistant

## 2022-07-23 VITALS — BP 112/60 | HR 70 | Ht 75.0 in | Wt 240.0 lb

## 2022-07-23 DIAGNOSIS — R072 Precordial pain: Secondary | ICD-10-CM | POA: Diagnosis not present

## 2022-07-23 DIAGNOSIS — I480 Paroxysmal atrial fibrillation: Secondary | ICD-10-CM | POA: Insufficient documentation

## 2022-07-23 DIAGNOSIS — I272 Pulmonary hypertension, unspecified: Secondary | ICD-10-CM | POA: Insufficient documentation

## 2022-07-23 DIAGNOSIS — I495 Sick sinus syndrome: Secondary | ICD-10-CM | POA: Insufficient documentation

## 2022-07-23 DIAGNOSIS — I2510 Atherosclerotic heart disease of native coronary artery without angina pectoris: Secondary | ICD-10-CM | POA: Insufficient documentation

## 2022-07-23 DIAGNOSIS — I82409 Acute embolism and thrombosis of unspecified deep veins of unspecified lower extremity: Secondary | ICD-10-CM | POA: Diagnosis not present

## 2022-07-23 DIAGNOSIS — I38 Endocarditis, valve unspecified: Secondary | ICD-10-CM | POA: Insufficient documentation

## 2022-07-23 DIAGNOSIS — I4729 Other ventricular tachycardia: Secondary | ICD-10-CM | POA: Insufficient documentation

## 2022-07-23 NOTE — Patient Instructions (Signed)
Medication Instructions:  Your physician recommends that you continue on your current medications as directed. Please refer to the Current Medication list given to you today.  *If you need a refill on your cardiac medications before your next appointment, please call your pharmacy*   Lab Work: None ordered  If you have labs (blood work) drawn today and your tests are completely normal, you will receive your results only by: [place] (if you have MyChart) OR A paper copy in the mail If you have any lab test that is abnormal or we need to change your treatment, we will call you to review the results.   Testing/Procedures: None ordered today, but can go ahead and get the Echocardiogram scheduled for March 2024 and then you will see Dr. Marlou Porch after that.   Your physician recommends that you schedule a follow-up appointment in: February 2024 with Dr. Curt Bears    Follow-Up: At [facility] you and your health needs are our priority.  As part of our continuing mission to provide you with exceptional heart care, we have created designated Provider Care Teams.  These Care Teams include your primary Cardiologist (physician) and Advanced Practice Providers (APPs -  Physician Assistants and Nurse Practitioners) who all work together to provide you with the care you need, when you need it.  We recommend signing up for the patient portal called "MyChart".  Sign up information is provided on this After Visit Summary.  MyChart is used to connect with patients for Virtual Visits (Telemedicine).  Patients are able to view lab/test results, encounter notes, upcoming appointments, etc.  Non-urgent messages can be sent to your provider as well.   To learn more about what you can do with MyChart, go to [email or web address].    Your next appointment:   3 month(s)  The format for your next appointment:   In Person  Provider:   [name], MD     Other Instructions   Important  Information About Sugar

## 2022-08-15 NOTE — Progress Notes (Signed)
Remote pacemaker transmission.   

## 2022-08-21 DIAGNOSIS — R0602 Shortness of breath: Secondary | ICD-10-CM | POA: Diagnosis not present

## 2022-08-21 DIAGNOSIS — K449 Diaphragmatic hernia without obstruction or gangrene: Secondary | ICD-10-CM | POA: Diagnosis present

## 2022-08-21 DIAGNOSIS — I083 Combined rheumatic disorders of mitral, aortic and tricuspid valves: Secondary | ICD-10-CM | POA: Diagnosis not present

## 2022-08-21 DIAGNOSIS — E669 Obesity, unspecified: Secondary | ICD-10-CM | POA: Diagnosis present

## 2022-08-21 DIAGNOSIS — I5033 Acute on chronic diastolic (congestive) heart failure: Secondary | ICD-10-CM | POA: Diagnosis present

## 2022-08-21 DIAGNOSIS — I129 Hypertensive chronic kidney disease with stage 1 through stage 4 chronic kidney disease, or unspecified chronic kidney disease: Secondary | ICD-10-CM | POA: Diagnosis not present

## 2022-08-21 DIAGNOSIS — I21A1 Myocardial infarction type 2: Secondary | ICD-10-CM | POA: Diagnosis present

## 2022-08-21 DIAGNOSIS — I517 Cardiomegaly: Secondary | ICD-10-CM | POA: Diagnosis not present

## 2022-08-21 DIAGNOSIS — R531 Weakness: Secondary | ICD-10-CM | POA: Diagnosis present

## 2022-08-21 DIAGNOSIS — I480 Paroxysmal atrial fibrillation: Secondary | ICD-10-CM | POA: Diagnosis not present

## 2022-08-21 DIAGNOSIS — I495 Sick sinus syndrome: Secondary | ICD-10-CM | POA: Diagnosis present

## 2022-08-21 DIAGNOSIS — U099 Post covid-19 condition, unspecified: Secondary | ICD-10-CM | POA: Diagnosis present

## 2022-08-21 DIAGNOSIS — N179 Acute kidney failure, unspecified: Secondary | ICD-10-CM | POA: Diagnosis present

## 2022-08-21 DIAGNOSIS — Z950 Presence of cardiac pacemaker: Secondary | ICD-10-CM | POA: Diagnosis not present

## 2022-08-21 DIAGNOSIS — M7989 Other specified soft tissue disorders: Secondary | ICD-10-CM | POA: Diagnosis not present

## 2022-08-21 DIAGNOSIS — E871 Hypo-osmolality and hyponatremia: Secondary | ICD-10-CM | POA: Diagnosis not present

## 2022-08-21 DIAGNOSIS — I25118 Atherosclerotic heart disease of native coronary artery with other forms of angina pectoris: Secondary | ICD-10-CM | POA: Diagnosis present

## 2022-08-21 DIAGNOSIS — I272 Pulmonary hypertension, unspecified: Secondary | ICD-10-CM | POA: Diagnosis present

## 2022-08-21 DIAGNOSIS — R441 Visual hallucinations: Secondary | ICD-10-CM | POA: Diagnosis present

## 2022-08-21 DIAGNOSIS — I10 Essential (primary) hypertension: Secondary | ICD-10-CM | POA: Diagnosis not present

## 2022-08-21 DIAGNOSIS — L03116 Cellulitis of left lower limb: Secondary | ICD-10-CM | POA: Diagnosis present

## 2022-08-21 DIAGNOSIS — Z7901 Long term (current) use of anticoagulants: Secondary | ICD-10-CM | POA: Diagnosis not present

## 2022-08-21 DIAGNOSIS — R0989 Other specified symptoms and signs involving the circulatory and respiratory systems: Secondary | ICD-10-CM | POA: Diagnosis not present

## 2022-08-21 DIAGNOSIS — I130 Hypertensive heart and chronic kidney disease with heart failure and stage 1 through stage 4 chronic kidney disease, or unspecified chronic kidney disease: Secondary | ICD-10-CM | POA: Diagnosis present

## 2022-08-21 DIAGNOSIS — I350 Nonrheumatic aortic (valve) stenosis: Secondary | ICD-10-CM | POA: Diagnosis not present

## 2022-08-21 DIAGNOSIS — R509 Fever, unspecified: Secondary | ICD-10-CM | POA: Diagnosis not present

## 2022-08-21 DIAGNOSIS — J986 Disorders of diaphragm: Secondary | ICD-10-CM | POA: Diagnosis not present

## 2022-08-21 DIAGNOSIS — J9601 Acute respiratory failure with hypoxia: Secondary | ICD-10-CM | POA: Diagnosis present

## 2022-08-21 DIAGNOSIS — B349 Viral infection, unspecified: Secondary | ICD-10-CM | POA: Diagnosis not present

## 2022-08-21 DIAGNOSIS — D696 Thrombocytopenia, unspecified: Secondary | ICD-10-CM | POA: Diagnosis present

## 2022-08-21 DIAGNOSIS — N184 Chronic kidney disease, stage 4 (severe): Secondary | ICD-10-CM | POA: Diagnosis present

## 2022-08-21 DIAGNOSIS — R918 Other nonspecific abnormal finding of lung field: Secondary | ICD-10-CM | POA: Diagnosis not present

## 2022-08-21 DIAGNOSIS — I080 Rheumatic disorders of both mitral and aortic valves: Secondary | ICD-10-CM | POA: Diagnosis present

## 2022-08-21 DIAGNOSIS — J189 Pneumonia, unspecified organism: Secondary | ICD-10-CM | POA: Diagnosis not present

## 2022-08-21 DIAGNOSIS — Z6828 Body mass index (BMI) 28.0-28.9, adult: Secondary | ICD-10-CM | POA: Diagnosis not present

## 2022-08-21 DIAGNOSIS — E785 Hyperlipidemia, unspecified: Secondary | ICD-10-CM | POA: Diagnosis present

## 2022-08-21 DIAGNOSIS — R059 Cough, unspecified: Secondary | ICD-10-CM | POA: Diagnosis present

## 2022-08-24 DIAGNOSIS — I480 Paroxysmal atrial fibrillation: Secondary | ICD-10-CM | POA: Diagnosis present

## 2022-08-24 DIAGNOSIS — L03116 Cellulitis of left lower limb: Secondary | ICD-10-CM | POA: Diagnosis present

## 2022-08-24 DIAGNOSIS — E785 Hyperlipidemia, unspecified: Secondary | ICD-10-CM | POA: Diagnosis present

## 2022-08-24 DIAGNOSIS — I10 Essential (primary) hypertension: Secondary | ICD-10-CM | POA: Diagnosis not present

## 2022-08-24 DIAGNOSIS — I080 Rheumatic disorders of both mitral and aortic valves: Secondary | ICD-10-CM | POA: Diagnosis present

## 2022-08-24 DIAGNOSIS — I21A1 Myocardial infarction type 2: Secondary | ICD-10-CM | POA: Diagnosis present

## 2022-08-24 DIAGNOSIS — R059 Cough, unspecified: Secondary | ICD-10-CM | POA: Diagnosis present

## 2022-08-24 DIAGNOSIS — D696 Thrombocytopenia, unspecified: Secondary | ICD-10-CM | POA: Diagnosis present

## 2022-08-24 DIAGNOSIS — I272 Pulmonary hypertension, unspecified: Secondary | ICD-10-CM | POA: Diagnosis present

## 2022-08-24 DIAGNOSIS — E871 Hypo-osmolality and hyponatremia: Secondary | ICD-10-CM | POA: Diagnosis present

## 2022-08-24 DIAGNOSIS — U099 Post covid-19 condition, unspecified: Secondary | ICD-10-CM | POA: Diagnosis present

## 2022-08-24 DIAGNOSIS — I25118 Atherosclerotic heart disease of native coronary artery with other forms of angina pectoris: Secondary | ICD-10-CM | POA: Diagnosis present

## 2022-08-24 DIAGNOSIS — J189 Pneumonia, unspecified organism: Secondary | ICD-10-CM | POA: Diagnosis not present

## 2022-08-24 DIAGNOSIS — I350 Nonrheumatic aortic (valve) stenosis: Secondary | ICD-10-CM | POA: Diagnosis not present

## 2022-08-24 DIAGNOSIS — I495 Sick sinus syndrome: Secondary | ICD-10-CM | POA: Diagnosis present

## 2022-08-24 DIAGNOSIS — R531 Weakness: Secondary | ICD-10-CM | POA: Diagnosis present

## 2022-08-24 DIAGNOSIS — I130 Hypertensive heart and chronic kidney disease with heart failure and stage 1 through stage 4 chronic kidney disease, or unspecified chronic kidney disease: Secondary | ICD-10-CM | POA: Diagnosis present

## 2022-08-24 DIAGNOSIS — J9601 Acute respiratory failure with hypoxia: Secondary | ICD-10-CM | POA: Diagnosis present

## 2022-08-24 DIAGNOSIS — R441 Visual hallucinations: Secondary | ICD-10-CM | POA: Diagnosis present

## 2022-08-24 DIAGNOSIS — I129 Hypertensive chronic kidney disease with stage 1 through stage 4 chronic kidney disease, or unspecified chronic kidney disease: Secondary | ICD-10-CM | POA: Diagnosis not present

## 2022-08-24 DIAGNOSIS — N184 Chronic kidney disease, stage 4 (severe): Secondary | ICD-10-CM | POA: Diagnosis present

## 2022-08-24 DIAGNOSIS — Z950 Presence of cardiac pacemaker: Secondary | ICD-10-CM | POA: Diagnosis not present

## 2022-08-24 DIAGNOSIS — I517 Cardiomegaly: Secondary | ICD-10-CM | POA: Diagnosis not present

## 2022-08-24 DIAGNOSIS — M7989 Other specified soft tissue disorders: Secondary | ICD-10-CM | POA: Diagnosis not present

## 2022-08-24 DIAGNOSIS — K449 Diaphragmatic hernia without obstruction or gangrene: Secondary | ICD-10-CM | POA: Diagnosis present

## 2022-08-24 DIAGNOSIS — Z7901 Long term (current) use of anticoagulants: Secondary | ICD-10-CM | POA: Diagnosis not present

## 2022-08-24 DIAGNOSIS — R918 Other nonspecific abnormal finding of lung field: Secondary | ICD-10-CM | POA: Diagnosis not present

## 2022-08-24 DIAGNOSIS — R509 Fever, unspecified: Secondary | ICD-10-CM | POA: Diagnosis not present

## 2022-08-24 DIAGNOSIS — J986 Disorders of diaphragm: Secondary | ICD-10-CM | POA: Diagnosis not present

## 2022-08-24 DIAGNOSIS — I5033 Acute on chronic diastolic (congestive) heart failure: Secondary | ICD-10-CM | POA: Diagnosis present

## 2022-08-24 DIAGNOSIS — Z6828 Body mass index (BMI) 28.0-28.9, adult: Secondary | ICD-10-CM | POA: Diagnosis not present

## 2022-08-24 DIAGNOSIS — N179 Acute kidney failure, unspecified: Secondary | ICD-10-CM | POA: Diagnosis present

## 2022-08-24 DIAGNOSIS — R0989 Other specified symptoms and signs involving the circulatory and respiratory systems: Secondary | ICD-10-CM | POA: Diagnosis not present

## 2022-08-24 DIAGNOSIS — I083 Combined rheumatic disorders of mitral, aortic and tricuspid valves: Secondary | ICD-10-CM | POA: Diagnosis not present

## 2022-08-24 DIAGNOSIS — E669 Obesity, unspecified: Secondary | ICD-10-CM | POA: Diagnosis present

## 2022-08-29 ENCOUNTER — Ambulatory Visit: Payer: Medicare Other | Admitting: Primary Care

## 2022-09-03 ENCOUNTER — Encounter: Payer: Self-pay | Admitting: Cardiology

## 2022-09-03 ENCOUNTER — Encounter: Payer: Self-pay | Admitting: Family Medicine

## 2022-09-04 ENCOUNTER — Encounter: Payer: Self-pay | Admitting: Primary Care

## 2022-09-04 ENCOUNTER — Encounter: Payer: Self-pay | Admitting: Cardiology

## 2022-09-04 ENCOUNTER — Ambulatory Visit: Payer: Medicare Other | Attending: Cardiology | Admitting: Cardiology

## 2022-09-04 ENCOUNTER — Ambulatory Visit (INDEPENDENT_AMBULATORY_CARE_PROVIDER_SITE_OTHER): Payer: Medicare Other | Admitting: Primary Care

## 2022-09-04 VITALS — BP 112/70 | HR 95 | Ht 75.0 in | Wt 229.0 lb

## 2022-09-04 VITALS — BP 106/62 | HR 74 | Ht 75.0 in | Wt 228.0 lb

## 2022-09-04 DIAGNOSIS — R5382 Chronic fatigue, unspecified: Secondary | ICD-10-CM

## 2022-09-04 DIAGNOSIS — I480 Paroxysmal atrial fibrillation: Secondary | ICD-10-CM | POA: Diagnosis not present

## 2022-09-04 DIAGNOSIS — I2782 Chronic pulmonary embolism: Secondary | ICD-10-CM | POA: Diagnosis not present

## 2022-09-04 DIAGNOSIS — I495 Sick sinus syndrome: Secondary | ICD-10-CM

## 2022-09-04 DIAGNOSIS — D6869 Other thrombophilia: Secondary | ICD-10-CM | POA: Diagnosis not present

## 2022-09-04 DIAGNOSIS — R0602 Shortness of breath: Secondary | ICD-10-CM | POA: Diagnosis not present

## 2022-09-04 DIAGNOSIS — L0390 Cellulitis, unspecified: Secondary | ICD-10-CM | POA: Diagnosis not present

## 2022-09-04 DIAGNOSIS — I4891 Unspecified atrial fibrillation: Secondary | ICD-10-CM

## 2022-09-04 DIAGNOSIS — I509 Heart failure, unspecified: Secondary | ICD-10-CM | POA: Diagnosis not present

## 2022-09-04 DIAGNOSIS — I2510 Atherosclerotic heart disease of native coronary artery without angina pectoris: Secondary | ICD-10-CM

## 2022-09-04 MED ORDER — METOPROLOL SUCCINATE ER 50 MG PO TB24: 50.0000 mg | ORAL_TABLET | Freq: Every day | ORAL | 6 refills | Status: DC

## 2022-09-04 NOTE — Patient Instructions (Signed)
  Medication Instructions:  Your physician has recommended you make the following change in your medication: STOP Metoprolol Tartrate (Lopressor) START Metoprolol Succinate (Toprol) 50 mg once daily at bedtime  *If you need a refill on your cardiac medications before your next appointment, please call your pharmacy*   Lab Work: None ordered   Testing/Procedures: None ordered   Follow-Up: At [facility], you and your health needs are our priority.  As part of our continuing mission to provide you with exceptional heart care, we have created designated Provider Care Teams.  These Care Teams include your primary Cardiologist (physician) and Advanced Practice Providers (APPs -  Physician Assistants and Nurse Practitioners) who all work together to provide you with the care you need, when you need it.   Remote monitoring is used to monitor your Pacemaker or ICD from home. This monitoring reduces the number of office visits required to check your device to one time per year. It allows [place] to keep an eye on the functioning of your device to ensure it is working properly. You are scheduled for a device check from home on 10/16/2022. You may send your transmission at any time that day. If you have a wireless device, the transmission will be sent automatically. After your physician reviews your transmission, you will receive a postcard with your next transmission date.  Your next appointment:   6 month(s)  The format for your next appointment:   In Person  Provider:   Allegra Lai, MD{   Thank you for choosing [facility]!!   Trinidad Curet, RN [phone]    Other Instructions

## 2022-09-04 NOTE — Progress Notes (Deleted)
Electrophysiology Office Note   Date:  09/04/2022   ID:  [name], [name] 1934-01-23, MRN [id number]  PCP:  Marin Olp, MD  Cardiologist:  Marlou Porch Primary Electrophysiologist:  Stormy Sabol Meredith Leeds, MD    No chief complaint on file.    History of Present Illness: [name] is a 89 y.o. male who is being seen today for the evaluation of sick sinus syndrome at the request of [name]. Presenting today for electrophysiology evaluation.    He has a history seen for coronary artery disease post PCI with DES to the RCA and LAD in 2018, CKD stage IV, hypertension, hyperlipidemia.  He has post Medtronic dual-chamber pacemaker implanted in 2019 for sick sinus syndrome.  Today, denies symptoms of palpitations, chest pain, shortness of breath, orthopnea, PND, lower extremity edema, claudication, dizziness, presyncope, syncope, bleeding, or neurologic sequela. The patient is tolerating medications without difficulties. ***     Past Medical History:  Diagnosis Date   Arthritis    "back, knees" (11/21/2016)   Bladder stones    CAD (coronary artery disease)    11/20/16 PCI with DES--> LAD/RCA   Chronic lower back pain    CKD (chronic kidney disease), stage IV ([place])    [name] 11/08/2016   Dilatation of aorta (HCC)    Elevated hemidiaphragm    History of gout    History of kidney stones 12/02/2008   Hyperlipidemia    Hypertension    Myocardial infarction ([place])    "silent" (11/21/2016)   NSVT (nonsustained ventricular tachycardia) (HCC)    Pacemaker    PAF (paroxysmal atrial fibrillation) ([place])    PSA, INCREASED 12/05/2009   Pulmonary disease    Pulmonary emboli (HCC)    Pulmonary hypertension ([place])    Recurrent deep venous thrombosis (HCC)    UTI (urinary tract infection)    Past Surgical History:  Procedure Laterality Date   CARDIAC CATHETERIZATION  11/14/2016   CATARACT EXTRACTION W/ INTRAOCULAR LENS  IMPLANT, BILATERAL     CORONARY ANGIOPLASTY WITH STENT  PLACEMENT  11/21/2016   "2 stents"    CORONARY ATHERECTOMY N/A 11/21/2016   Procedure: Coronary Atherectomy;  Surgeon: Leonie Man, MD;  Location: [place] [facility];  Service: Cardiovascular;  Laterality: N/A;  RCA and LAD   CORONARY STENT INTERVENTION N/A 11/21/2016   Procedure: Coronary Stent Intervention;  Surgeon: Leonie Man, MD;  Location: [place] [facility];  Service: Cardiovascular;  Laterality: N/A;  RCA and LAD   CYSTOSCOPY W/ STONE MANIPULATION     INTRAVASCULAR PRESSURE WIRE/FFR STUDY N/A 05/22/2019   Procedure: INTRAVASCULAR PRESSURE WIRE/FFR STUDY;  Surgeon: Nelva Bush, MD;  Location: [place] [facility];  Service: Cardiovascular;  Laterality: N/A;   JOINT REPLACEMENT     KNEE ARTHROSCOPY Left    LAPAROSCOPIC CHOLECYSTECTOMY     LEFT HEART CATH N/A 11/21/2016   Procedure: Left Heart Cath;  Surgeon: Leonie Man, MD;  Location: [place] [facility];  Service: Cardiovascular;  Laterality: N/A;   LITHOTRIPSY     PILONIDAL CYST EXCISION  56   RIGHT/LEFT HEART CATH AND CORONARY ANGIOGRAPHY N/A 11/14/2016   Procedure: Right/Left Heart Cath and Coronary Angiography;  Surgeon: Leonie Man, MD;  Location: [place] [facility];  Service: Cardiovascular;  Laterality: N/A;   RIGHT/LEFT HEART CATH AND CORONARY ANGIOGRAPHY N/A 05/22/2019   Procedure: RIGHT/LEFT HEART CATH AND CORONARY ANGIOGRAPHY;  Surgeon: Nelva Bush, MD;  Location: [place] [facility];  Service: Cardiovascular;  Laterality:  N/A;   TEMPORARY PACEMAKER N/A 11/21/2016   Procedure: Temporary Pacemaker;  Surgeon: Leonie Man, MD;  Location: [place] [facility];  Service: Cardiovascular;  Laterality: N/A;   TONSILLECTOMY     TOTAL KNEE ARTHROPLASTY Left 09/22/2012   Procedure: TOTAL KNEE ARTHROPLASTY;  Surgeon: Lorn Junes, MD;  Location: [place];  Service: Orthopedics;  Laterality: Left;     Current Outpatient Medications  Medication Sig Dispense Refill   allopurinol (ZYLOPRIM) 300 MG tablet TAKE 1 TABLET  DAILY 90 tablet 3   apixaban (ELIQUIS) 2.5 MG TABS tablet TAKE 1 TABLET TWICE A DAY 180 tablet 0   atorvastatin (LIPITOR) 40 MG tablet TAKE 1 TABLET DAILY 90 tablet 3   budesonide-formoterol (SYMBICORT) 80-4.5 MCG/ACT inhaler Inhale 2 puffs into the lungs in the morning and at bedtime. 1 each 0   diphenhydramine-acetaminophen (TYLENOL PM) 25-500 MG TABS tablet Take 1 tablet by mouth at bedtime as needed (sleep).     finasteride (PROSCAR) 5 MG tablet Take 5 mg by mouth daily.     furosemide (LASIX) 20 MG tablet TAKE 1 TABLET DAILY 90 tablet 2   metoprolol tartrate (LOPRESSOR) 25 MG tablet TAKE 1 TABLET TWICE A DAY 180 tablet 3   nitroGLYCERIN (NITROSTAT) 0.4 MG SL tablet Place 1 tablet (0.4 mg total) under the tongue every 5 (five) minutes as needed for chest pain. 25 tablet 11   ranolazine (RANEXA) 1000 MG SR tablet TAKE 1 TABLET TWICE A DAY 180 tablet 3   Tamsulosin HCl (FLOMAX) 0.4 MG CAPS Take 0.4 mg by mouth at bedtime.      No current facility-administered medications for this visit.    Allergies:   Epinephrine, Penicillins, Lisinopril, and Novocain [procaine]   Social History:  The patient  reports that he has never smoked. He has never used smokeless tobacco. He reports that he does not currently use alcohol. He reports that he does not use drugs.   Family History:  The patient's family history includes Breast cancer in his mother; Heart attack in his father.   ROS:  Please see the history of present illness.   Otherwise, review of systems is positive for none.   All other systems are reviewed and negative.   PHYSICAL EXAM: VS:  There were no vitals taken for this visit. , BMI There is no height or weight on file to calculate BMI. GEN: Well nourished, well developed, in no acute distress  HEENT: normal  Neck: no JVD, carotid bruits, or masses Cardiac: ***RRR; no murmurs, rubs, or gallops,no edema  Respiratory:  clear to auscultation bilaterally, normal work of breathing GI: soft,  nontender, nondistended, + BS MS: no deformity or atrophy  Skin: warm and dry, device site well healed Neuro:  Strength and sensation are intact Psych: euthymic mood, full affect  EKG:  EKG {ACTION; IS/IS [id number]} ordered today. Personal review of the ekg ordered *** shows ***  Personal review of the device interrogation today. Results in [place]: No results found for requested labs within last 365 days.    Lipid Panel     Component Value Date/Time   CHOL 122 11/30/2020 1215   TRIG 106.0 11/30/2020 1215   HDL 42.20 11/30/2020 1215   CHOLHDL 3 11/30/2020 1215   VLDL 21.2 11/30/2020 1215   LDLCALC 58 11/30/2020 1215   LDLDIRECT 79.0 12/08/2014 0905     Wt Readings from Last 3 Encounters:  07/23/22 240 lb (108.9 kg)  10/26/21 244 lb (  110.7 kg)  10/06/21 242 lb (109.8 kg)      Other studies Reviewed: Additional studies/ records that were reviewed today include: TTE 11/05/17  Review of the above records today demonstrates:  - Left ventricle: The cavity size was normal. Systolic function was   normal. The estimated ejection fraction was in the range of 55%   to 60%. Wall motion was normal; there were no regional wall   motion abnormalities. Doppler parameters are consistent with   abnormal left ventricular relaxation (grade 1 diastolic   dysfunction). - Aortic valve: Noncoronary cusp mobility was severely restricted.   There was mild stenosis. There was mild to moderate   regurgitation. Valve area (VTI): 1.54 cm^2. Valve area (Vmax):   1.28 cm^2. Valve area (Vmean): 1.27 cm^2. - Mitral valve: Calcified annulus. Mildly thickened leaflets . - Left atrium: The atrium was moderately dilated. - Right ventricle: The cavity size was moderately dilated. Wall   thickness was normal. - Pulmonary arteries: PA peak pressure: 32 mm Hg (S).   ASSESSMENT AND PLAN:  1.  Sick sinus syndrome: Status post Medtronic dual-chamber pacemaker implanted in 2019.  Device  function appropriately.  No changes.  2.  Coronary disease: Status post RCA and LAD stents.  No current chest pain.  Plan per primary cardiology.  3.  Dilated aortic root: Has monitoring via primary cardiology  4.  Mild aortic stenosis: No symptoms  5.  Nonsustained VT: Found on device interrogation.  Continue metoprolol.  Current medicines are reviewed at length with the patient today.   The patient does not have concerns regarding his medicines.  The following changes were made today: ***  Labs/ tests ordered today include:  No orders of the defined types were placed in this encounter.    Disposition:   FU *** months  Signed, Perkins Molina Meredith Leeds, MD  09/04/2022 8:42 AM     [facility] [address] [place] [address] [phone] (office) [phone] (fax)

## 2022-09-04 NOTE — Patient Instructions (Addendum)
Recommendations Stop Symbicort Continue Lasix 20 mg daily Start Toprol XL 50 mg at bedtime per cardiology Continue Eliquis 2.5 mg twice daily Wrap left lower leg and elevated Notify office if fatigue does not improve and/or you have any sleep disruption or snoring symptoms   Follow-up: 6 months with Dr. Lamonte Sakai or sooner if needed

## 2022-09-04 NOTE — Progress Notes (Signed)
Electrophysiology Office Note   Date:  09/04/2022   ID:  [name], [name] 1934-02-05, MRN [id number]  PCP:  Marin Olp, MD  Cardiologist:  Marlou Porch Primary Electrophysiologist:  Emanuella Nickle Meredith Leeds, MD    No chief complaint on file.    History of Present Illness: [name] is a 89 y.o. male who is being seen today for the evaluation of sick sinus syndrome at the request of [name]. Presenting today for electrophysiology evaluation.    He has a history seen for coronary artery disease post PCI with DES to the RCA and LAD in 2018, CKD stage IV, hypertension, hyperlipidemia.  He has post Medtronic dual-chamber pacemaker implanted in 2019 for sick sinus syndrome.  Recent hospitalization at [facility] for acute on chronic diastolic heart failure and atrial fibrillation.  He was put on IV Lasix and amiodarone.  He had elevated troponins, thought due to demand ischemia.  Today, he continues to have issues with weakness and fatigue.  He is mildly short of breath as well, though he is much improved since his admission to the hospital.  He has also been having increasing lower extremity edema on the left leg.  He thinks that his left lower extremity is about his swollen is normal.  Today, denies symptoms of palpitations, chest pain, orthopnea, PND,  claudication, dizziness, presyncope, syncope, bleeding, or neurologic sequela. The patient is tolerating medications without difficulties.  Since hospitalization he has continued to feel fatigued.  He is not nearly as short of breath.  He is currently undergoing an amiodarone load.    Past Medical History:  Diagnosis Date   Arthritis    "back, knees" (11/21/2016)   Bladder stones    CAD (coronary artery disease)    11/20/16 PCI with DES--> LAD/RCA   Chronic lower back pain    CKD (chronic kidney disease), stage IV ([place])    [name] 11/08/2016   Dilatation of aorta (HCC)    Elevated hemidiaphragm    History of gout    History of  kidney stones 12/02/2008   Hyperlipidemia    Hypertension    Myocardial infarction ([place])    "silent" (11/21/2016)   NSVT (nonsustained ventricular tachycardia) (HCC)    Pacemaker    PAF (paroxysmal atrial fibrillation) ([place])    PSA, INCREASED 12/05/2009   Pulmonary disease    Pulmonary emboli (HCC)    Pulmonary hypertension ([place])    Recurrent deep venous thrombosis (HCC)    UTI (urinary tract infection)    Past Surgical History:  Procedure Laterality Date   CARDIAC CATHETERIZATION  11/14/2016   CATARACT EXTRACTION W/ INTRAOCULAR LENS  IMPLANT, BILATERAL     CORONARY ANGIOPLASTY WITH STENT PLACEMENT  11/21/2016   "2 stents"    CORONARY ATHERECTOMY N/A 11/21/2016   Procedure: Coronary Atherectomy;  Surgeon: Leonie Man, MD;  Location: [place] [facility];  Service: Cardiovascular;  Laterality: N/A;  RCA and LAD   CORONARY STENT INTERVENTION N/A 11/21/2016   Procedure: Coronary Stent Intervention;  Surgeon: Leonie Man, MD;  Location: [place] [facility];  Service: Cardiovascular;  Laterality: N/A;  RCA and LAD   CYSTOSCOPY W/ STONE MANIPULATION     INTRAVASCULAR PRESSURE WIRE/FFR STUDY N/A 05/22/2019   Procedure: INTRAVASCULAR PRESSURE WIRE/FFR STUDY;  Surgeon: Nelva Bush, MD;  Location: [place] [facility];  Service: Cardiovascular;  Laterality: N/A;   JOINT REPLACEMENT     KNEE ARTHROSCOPY Left    LAPAROSCOPIC CHOLECYSTECTOMY     LEFT  HEART CATH N/A 11/21/2016   Procedure: Left Heart Cath;  Surgeon: Leonie Man, MD;  Location: [place] [facility];  Service: Cardiovascular;  Laterality: N/A;   LITHOTRIPSY     PILONIDAL CYST EXCISION  56   RIGHT/LEFT HEART CATH AND CORONARY ANGIOGRAPHY N/A 11/14/2016   Procedure: Right/Left Heart Cath and Coronary Angiography;  Surgeon: Leonie Man, MD;  Location: [place] [facility];  Service: Cardiovascular;  Laterality: N/A;   RIGHT/LEFT HEART CATH AND CORONARY ANGIOGRAPHY N/A 05/22/2019   Procedure: RIGHT/LEFT HEART CATH AND CORONARY  ANGIOGRAPHY;  Surgeon: Nelva Bush, MD;  Location: [place] [facility];  Service: Cardiovascular;  Laterality: N/A;   TEMPORARY PACEMAKER N/A 11/21/2016   Procedure: Temporary Pacemaker;  Surgeon: Leonie Man, MD;  Location: [place] [facility];  Service: Cardiovascular;  Laterality: N/A;   TONSILLECTOMY     TOTAL KNEE ARTHROPLASTY Left 09/22/2012   Procedure: TOTAL KNEE ARTHROPLASTY;  Surgeon: Lorn Junes, MD;  Location: [place];  Service: Orthopedics;  Laterality: Left;     Current Outpatient Medications  Medication Sig Dispense Refill   allopurinol (ZYLOPRIM) 300 MG tablet TAKE 1 TABLET DAILY 90 tablet 3   amiodarone (PACERONE) 200 MG tablet Take 1 tablet twice a day for 2 weeks then decrease to 1 tablet daily     apixaban (ELIQUIS) 2.5 MG TABS tablet TAKE 1 TABLET TWICE A DAY 180 tablet 0   atorvastatin (LIPITOR) 40 MG tablet TAKE 1 TABLET DAILY 90 tablet 3   diphenhydramine-acetaminophen (TYLENOL PM) 25-500 MG TABS tablet Take 1 tablet by mouth at bedtime as needed (sleep).     finasteride (PROSCAR) 5 MG tablet Take 5 mg by mouth daily.     furosemide (LASIX) 20 MG tablet TAKE 1 TABLET DAILY 90 tablet 2   metoprolol succinate (TOPROL-XL) 50 MG 24 hr tablet Take 1 tablet (50 mg total) by mouth daily. Take with or immediately following a meal. 30 tablet 6   nitroGLYCERIN (NITROSTAT) 0.4 MG SL tablet Place 1 tablet (0.4 mg total) under the tongue every 5 (five) minutes as needed for chest pain. 25 tablet 11   pantoprazole (PROTONIX) 40 MG tablet Take by mouth.     ranolazine (RANEXA) 1000 MG SR tablet TAKE 1 TABLET TWICE A DAY 180 tablet 3   Tamsulosin HCl (FLOMAX) 0.4 MG CAPS Take 0.4 mg by mouth at bedtime.      budesonide-formoterol (SYMBICORT) 80-4.5 MCG/ACT inhaler Inhale 2 puffs into the lungs in the morning and at bedtime. (Patient not taking: Reported on 09/04/2022) 1 each 0   No current facility-administered medications for this visit.    Allergies:   Epinephrine,  Penicillins, Lisinopril, and Novocain [procaine]   Social History:  The patient  reports that he has never smoked. He has never used smokeless tobacco. He reports that he does not currently use alcohol. He reports that he does not use drugs.   Family History:  The patient's family history includes Breast cancer in his mother; Heart attack in his father.   ROS:  Please see the history of present illness.   Otherwise, review of systems is positive for none.   All other systems are reviewed and negative.   PHYSICAL EXAM: VS:  BP 112/70   Pulse 95   Ht '6\' 3"'$  (1.905 m)   Wt 229 lb (103.9 kg)   SpO2 93%   BMI 28.62 kg/m  , BMI Body mass index is 28.62 kg/m. GEN: Well nourished, well developed, in  no acute distress  HEENT: normal  Neck: no JVD, carotid bruits, or masses Cardiac: RRR; no murmurs, rubs, or gallops,no edema  Respiratory:  clear to auscultation bilaterally, normal work of breathing GI: soft, nontender, nondistended, + BS MS: no deformity or atrophy  Skin: warm and dry, device site well healed Neuro:  Strength and sensation are intact Psych: euthymic mood, full affect  EKG:  EKG is not ordered today. Personal review of the ekg ordered 07/23/22 shows atrial paced, LVH, rate 70, T wave abnormality  Personal review of the device interrogation today. Results in [place]: No results found for requested labs within last 365 days.    Lipid Panel     Component Value Date/Time   CHOL 122 11/30/2020 1215   TRIG 106.0 11/30/2020 1215   HDL 42.20 11/30/2020 1215   CHOLHDL 3 11/30/2020 1215   VLDL 21.2 11/30/2020 1215   LDLCALC 58 11/30/2020 1215   LDLDIRECT 79.0 12/08/2014 0905     Wt Readings from Last 3 Encounters:  09/04/22 229 lb (103.9 kg)  07/23/22 240 lb (108.9 kg)  10/26/21 244 lb (110.7 kg)      Other studies Reviewed: Additional studies/ records that were reviewed today include: TTE 11/05/17  Review of the above records today demonstrates:   - Left ventricle: The cavity size was normal. Systolic function was   normal. The estimated ejection fraction was in the range of 55%   to 60%. Wall motion was normal; there were no regional wall   motion abnormalities. Doppler parameters are consistent with   abnormal left ventricular relaxation (grade 1 diastolic   dysfunction). - Aortic valve: Noncoronary cusp mobility was severely restricted.   There was mild stenosis. There was mild to moderate   regurgitation. Valve area (VTI): 1.54 cm^2. Valve area (Vmax):   1.28 cm^2. Valve area (Vmean): 1.27 cm^2. - Mitral valve: Calcified annulus. Mildly thickened leaflets . - Left atrium: The atrium was moderately dilated. - Right ventricle: The cavity size was moderately dilated. Wall   thickness was normal. - Pulmonary arteries: PA peak pressure: 32 mm Hg (S).   ASSESSMENT AND PLAN:  1.  Sick sinus syndrome: Status post Medtronic dual-chamber pacemaker implanted in 2019.  Device function appropriately.  No changes.  2.  Coronary disease: Status post RCA and LAD stents.  No current chest pain.  Plan per primary cardiology.  3.  Dilated aortic root: Has monitoring via primary cardiology  4.  Mild aortic stenosis: No symptoms  5.  Nonsustained VT: Found on device interrogation.  Continue metoprolol.  6.  Paroxysmal atrial fibrillation: Had an episode of atrial fibrillation was admitted to the hospital.  He is back in normal rhythm to now, AV paced.  Raeleigh Guinn continue his amiodarone for now.  Also on Eliquis.  CHA2DS2-VASc of at least 4.  As he is having fatigue, we Gillermo Poch stop his metoprolol and start Toprol-XL 50 mg.  7.  Secondary hypercoagulable state: Currently on Eliquis for atrial fibrillation as above  Current medicines are reviewed at length with the patient today.   The patient does not have concerns regarding his medicines.  The following changes were made today: Stop all, start Toprol-XL  Labs/ tests ordered today include:  No  orders of the defined types were placed in this encounter.    Disposition:   FU 6 months  Signed, Micael Barb Meredith Leeds, MD  09/04/2022 12:28 PM     [facility]  [address] [address] [phone] (office) [phone] (fax)

## 2022-09-04 NOTE — Progress Notes (Signed)
$'@Patient'G$  ID: [name], male    DOB: 1934/06/30, 89 y.o.   MRN: [id number]  Chief Complaint  Patient presents with   Follow-up    SOB    Referring provider: Marin Olp, MD  HPI: 89 year old male, never smoked. PMH significant for HTN, CAD, chronic PE, CKD, hyperlipidemia, dyspnea. Patient of Dr. Lamonte Sakai, last seen 09/06/21.   09/04/2022 Patient presents today for overdue follow-up dyspnea. During last visit he was given trial Symbicort 19mg. He had restriction and obstruction of pulmonary function testing.   He was recently admitted for shortness of breath. Newly diagnosed of afib. He has hx heart failure. Prone to leg swelling. Treated for cellulitis, completed course of keflex. He is wrapping his leg and has been elevating at home. He saw cardiology today. He reports being very tired which was felt to be side effect from medications. Metoprolol was changed to once a day.  He sleeps alright at night. No loud snoring, gasping/ choking at night. He goes to bed at 11pm and wakes up at 7am.   His breathing has been pretty good. No significant shortness of breath today. He gets winded laying flat or when walking up stairs/inclines. Previous trial Symbicort did not help. He is compliant with lasix. He is not on oxygen.  Allergies  Allergen Reactions   Epinephrine Palpitations and Other (See Comments)    "Heart rate races"   Penicillins Hives    Has patient had a PCN reaction causing immediate rash, facial/tongue/throat swelling, SOB or lightheadedness with hypotension: No Has patient had a PCN reaction causing severe rash involving mucus membranes or skin necrosis: no Has patient had a PCN reaction that required hospitalization: No Has patient had a PCN reaction occurring within the last 10 years: No If all of the above answers are "NO", then may proceed with Cephalosporin use.    Lisinopril Cough   Novocain [Procaine] Palpitations and Other (See Comments)    Makes the heart  rate    Immunization History  Administered Date(s) Administered   Fluad Quad(high Dose 65+) 04/25/2021   Influenza Whole 04/25/2009, 06/21/2010, 07/01/2011   Influenza, High Dose Seasonal PF 04/22/2014, 06/10/2017, 05/06/2018, 05/12/2020, 06/13/2022   Influenza,inj,Quad PF,6+ Mos 06/17/2013   Influenza-Unspecified 06/20/2000, 05/13/2002, 06/08/2003, 06/06/2004, 07/13/2006, 04/25/2011, 06/17/2013, 04/22/2014, 05/22/2016, 06/10/2017, 05/01/2019   Moderna Sars-Covid-2 Vaccination 08/19/2019, 09/16/2019, 04/08/2020   Pneumococcal Conjugate-13 12/28/2013   Pneumococcal Polysaccharide-23 10/22/2007   Pneumococcal-Unspecified 04/07/2001   Td 12/05/2009   Zoster Recombinat (Shingrix) 06/13/2022    Past Medical History:  Diagnosis Date   Arthritis    "back, knees" (11/21/2016)   Bladder stones    CAD (coronary artery disease)    11/20/16 PCI with DES--> LAD/RCA   Chronic lower back pain    CKD (chronic kidney disease), stage IV (HCC)    /[name]3/29/2018   Dilatation of aorta (HCC)    Elevated hemidiaphragm    History of gout    History of kidney stones 12/02/2008   Hyperlipidemia    Hypertension    Myocardial infarction ([facility]    "silent" (11/21/2016)   NSVT (nonsustained ventricular tachycardia) (HCC)    Pacemaker    [place]    PSA, INCREASED 12/05/2009   Pulmonary disease    Pulmonary emboli (HCC)    Pulmonary hypertension (HCC)    Recurrent deep venous thrombosis (HCC)    UTI (urinary tract infection)     Tobacco History: Social History   Tobacco Use  Smoking  Status Never  Smokeless Tobacco Never   Counseling given: Not Answered   Outpatient Medications Prior to Visit  Medication Sig Dispense Refill   allopurinol (ZYLOPRIM) 300 MG tablet TAKE 1 TABLET DAILY 90 tablet 3   atorvastatin (LIPITOR) 40 MG tablet TAKE 1 TABLET DAILY 90 tablet 3   diphenhydramine-acetaminophen (TYLENOL PM) 25-500 MG TABS tablet Take 1 tablet by mouth at  bedtime as needed (sleep).     finasteride (PROSCAR) 5 MG tablet Take 5 mg by mouth daily.     furosemide (LASIX) 20 MG tablet TAKE 1 TABLET DAILY 90 tablet 2   nitroGLYCERIN (NITROSTAT) 0.4 MG SL tablet Place 1 tablet (0.4 mg total) under the tongue every 5 (five) minutes as needed for chest pain. 25 tablet 11   pantoprazole (PROTONIX) 40 MG tablet Take by mouth.     ranolazine (RANEXA) 1000 MG SR tablet TAKE 1 TABLET TWICE A DAY 180 tablet 3   Tamsulosin HCl (FLOMAX) 0.4 MG CAPS Take 0.4 mg by mouth at bedtime.      amiodarone (PACERONE) 200 MG tablet Take 1 tablet twice a day for 2 weeks then decrease to 1 tablet daily     apixaban (ELIQUIS) 2.5 MG TABS tablet TAKE 1 TABLET TWICE A DAY 180 tablet 0   budesonide-formoterol (SYMBICORT) 80-4.5 MCG/ACT inhaler Inhale 2 puffs into the lungs in the morning and at bedtime. 1 each 0   metoprolol succinate (TOPROL-XL) 50 MG 24 hr tablet Take 1 tablet (50 mg total) by mouth daily. Take with or immediately following a meal. 30 tablet 6   metoprolol tartrate (LOPRESSOR) 25 MG tablet Take 1 tablet by mouth 2 (two) times daily.     No facility-administered medications prior to visit.      Review of Systems  Review of Systems  Constitutional: Negative.   HENT: Negative.    Respiratory:  Positive for shortness of breath. Negative for cough, chest tightness and wheezing.        DOE with inclines/stairs   Cardiovascular:  Positive for leg swelling.   Physical Exam  BP 106/62 (BP Location: Left Arm, Patient Position: Sitting, Cuff Size: Large)   Pulse 74   Ht '6\' 3"'$  (1.905 m)   Wt 228 lb (103.4 kg)   SpO2 98%   BMI 28.50 kg/m  Physical Exam Constitutional:      Appearance: Normal appearance.  HENT:     Head: Normocephalic and atraumatic.     Mouth/Throat:     Mouth: Mucous membranes are moist.     Pharynx: Oropharynx is clear.  Cardiovascular:     Rate and Rhythm: Normal rate.  Pulmonary:     Effort: Pulmonary effort is normal.      Breath sounds: Normal breath sounds.     Comments: Faint rales right base, otherwise clear. O2 98% RA  Musculoskeletal:        General: Swelling present.     Comments: LLE swelling +3  Neurological:     General: No focal deficit present.     Mental Status: He is alert and oriented to person, place, and time. Mental status is at baseline.  Psychiatric:        Mood and Affect: Mood normal.        Behavior: Behavior normal.        Thought Content: Thought content normal.        Judgment: Judgment normal.      Lab Results:  CBC    Component Value Date/Time  WBC 7.4 04/28/2021 1642   RBC 4.45 04/28/2021 1642   HGB 14.2 04/28/2021 1642   HGB 15.4 05/18/2019 1434   HCT 42.9 04/28/2021 1642   HCT 45.2 05/18/2019 1434   PLT 160 04/28/2021 1642   PLT 168 05/18/2019 1434   MCV 96.4 04/28/2021 1642   MCV 92 05/18/2019 1434   MCH 31.9 04/28/2021 1642   MCHC 33.1 04/28/2021 1642   RDW 13.1 04/28/2021 1642   RDW 13.2 05/18/2019 1434   LYMPHSABS 1,976 04/28/2021 1642   LYMPHSABS 2.5 05/18/2019 1434   MONOABS 0.7 12/09/2020 1452   EOSABS 296 04/28/2021 1642   EOSABS 0.4 05/18/2019 1434   BASOSABS 30 04/28/2021 1642   BASOSABS 0.0 05/18/2019 1434    BMET    Component Value Date/Time   NA 137 04/28/2021 1642   NA 140 01/24/2021 0943   K 4.5 04/28/2021 1642   CL 102 04/28/2021 1642   CO2 29 04/28/2021 1642   GLUCOSE 91 04/28/2021 1642   BUN 35 (H) 04/28/2021 1642   BUN 34 (H) 01/24/2021 0943   CREATININE 1.76 (H) 04/28/2021 1642   CALCIUM 9.3 04/28/2021 1642   GFRNONAA 22 (L) 07/04/2019 0225   GFRAA 26 (L) 07/04/2019 0225    BNP No results found for: "BNP"  ProBNP    Component Value Date/Time   PROBNP 94.0 12/09/2020 1452    Imaging: No results found.   Assessment & Plan:   Dyspnea - Dyspnea felt to be multifactorial. He had restriction and obstruction of breathing test. He saw no clinical benefit from trial Symbicort 80mg two puffs twice daily. Ok to stop  taking. Encourage patient stay active. Symptoms more likely d/t cardiac hx.   [place] - New diagnosis - Continue amiodarone  - Started Toprol XL 50 mg at bedtime per cardiology d/t fatigue  - Continue Eliquis 2.5 mg twice daily  Chronic pulmonary embolism (HCC) - Continue anticoagulation   Fatigue - No overt signs of sleep apnea; Sleeps well at night without snoring or choking symptoms  - Consider sleep testing if fatigue does not improve with medication changes   Cellulitis - Prone to leg swelling. Treated with keflex - Continue leg wraps and elevation   CHF (congestive heart failure) (HCC) - Continue lasix '20mg'$  daily   EMartyn Ehrich NP 09/17/2022

## 2022-09-10 ENCOUNTER — Telehealth: Payer: Self-pay | Admitting: Cardiology

## 2022-09-10 ENCOUNTER — Encounter: Payer: Self-pay | Admitting: Family Medicine

## 2022-09-10 ENCOUNTER — Other Ambulatory Visit: Payer: Self-pay | Admitting: Cardiology

## 2022-09-10 ENCOUNTER — Encounter: Payer: Self-pay | Admitting: Cardiology

## 2022-09-10 DIAGNOSIS — I2782 Chronic pulmonary embolism: Secondary | ICD-10-CM

## 2022-09-10 MED ORDER — AMIODARONE HCL 200 MG PO TABS: ORAL_TABLET | ORAL | 3 refills | Status: DC

## 2022-09-10 MED ORDER — METOPROLOL SUCCINATE ER 50 MG PO TB24: 50.0000 mg | ORAL_TABLET | Freq: Every day | ORAL | 3 refills | Status: DC

## 2022-09-10 NOTE — Telephone Encounter (Signed)
*  STAT* If patient is at the pharmacy, call can be transferred to refill team.   1. Which medications need to be refilled? (please list name of each medication and dose if known)  amiodarone (PACERONE) 200 MG tablet   2. Which pharmacy/location (including street and city if local pharmacy) is medication to be sent to?  [facility] [place], [place]    3. Do they need a 30 day or 90 day supply? 90 day supply

## 2022-09-10 NOTE — Telephone Encounter (Signed)
Prescription refill request for Eliquis received. Indication:dvt Last office visit:1/24 Scr:1.5 Age: 89 Weight:103.4  kg  Prescription refilled

## 2022-09-10 NOTE — Telephone Encounter (Signed)
Refill for Amiodarone has been sent to [facility].

## 2022-09-12 ENCOUNTER — Telehealth: Payer: Self-pay | Admitting: *Deleted

## 2022-09-12 NOTE — Telephone Encounter (Signed)
From recent pt advise request:  On/about 12-30 August 2022 I was admitted to [facility] in [place] with diastolic heart failure and doctors this his was exacerbated by atrial fibrillation. My symptoms were extreme shortness of breath and general nausea. Treatment included as many blood tests you could stand per day and a 7-day course of antibiotic for cellulitis in left leg. I also  saw physical therapist  and breathing specialist. Physical therapist tested me for walking,etc. and recommended a walker which was issued to me upon discharge. Medicines prescribed upon my discharge: Furosemide 20 mg daily Amiodarone  take two twice per day for two weeks and then one dose per day. I have completed four days of this medication. Removed simvastatin as I am already taking Atorvastatin.   Cardiologist recommended that I make an immediate appointment with Dr. Etter Sjogren to discuss forward moving plan. In our meeting I would like to discuss Heart Rehab Program, dietitian assistance and other programs or change of life style  suggestions. I do not smoke and do not consume alcoholic beverage. Never smoked.   Hospital should have or will send summary to Dr Marlou Porch.     Spoke with pt today to schedule f/u p/hosp at [facility] for CHF.  Pt is reporting feeling "a little bit slow."  BP today has been 98/74 and 90/69 HR 68.  He denies any other symptoms.  Currently is taking amiodarone 200 mg daily, Furosemide 20 mg a day and Metoprolol succinate 50 mg daily. Pt is also asking if he would qualify for cardiac rehab.  Advised I will forward this information to Dr Marlou Porch for his review.  Pt has been scheduled for f/u 09/20/2022.  He will call back before then if any s/s.

## 2022-09-13 NOTE — Telephone Encounter (Signed)
  Thank you for the update.  We will have follow-up next week.  [name], MD

## 2022-09-17 DIAGNOSIS — L0390 Cellulitis, unspecified: Secondary | ICD-10-CM | POA: Insufficient documentation

## 2022-09-17 DIAGNOSIS — R5383 Other fatigue: Secondary | ICD-10-CM | POA: Insufficient documentation

## 2022-09-17 DIAGNOSIS — I509 Heart failure, unspecified: Secondary | ICD-10-CM | POA: Insufficient documentation

## 2022-09-17 DIAGNOSIS — I4891 Unspecified atrial fibrillation: Secondary | ICD-10-CM | POA: Insufficient documentation

## 2022-09-17 NOTE — Assessment & Plan Note (Signed)
-   Continue anticoagulation

## 2022-09-17 NOTE — Assessment & Plan Note (Signed)
-   Prone to leg swelling. Treated with keflex - Continue leg wraps and elevation

## 2022-09-17 NOTE — Assessment & Plan Note (Addendum)
-   New diagnosis - Continue amiodarone  - Started Toprol XL 50 mg at bedtime per cardiology d/t fatigue  - Continue Eliquis 2.5 mg twice daily

## 2022-09-17 NOTE — Assessment & Plan Note (Addendum)
-   No overt signs of sleep apnea; Sleeps well at night without snoring or choking symptoms  - Consider sleep testing if fatigue does not improve with medication changes

## 2022-09-17 NOTE — Assessment & Plan Note (Signed)
-   Dyspnea felt to be multifactorial. He had restriction and obstruction of breathing test. He saw no clinical benefit from trial Symbicort 38mg two puffs twice daily. Ok to stop taking. Encourage patient stay active. Symptoms more likely d/t cardiac hx.

## 2022-09-17 NOTE — Assessment & Plan Note (Addendum)
-   Continue lasix 20mg daily

## 2022-09-20 ENCOUNTER — Encounter: Payer: Self-pay | Admitting: Cardiology

## 2022-09-20 ENCOUNTER — Ambulatory Visit: Payer: Medicare Other | Attending: Cardiology | Admitting: Cardiology

## 2022-09-20 VITALS — BP 112/70 | HR 74 | Ht 75.0 in | Wt 221.0 lb

## 2022-09-20 DIAGNOSIS — Z79899 Other long term (current) drug therapy: Secondary | ICD-10-CM | POA: Diagnosis not present

## 2022-09-20 DIAGNOSIS — I2510 Atherosclerotic heart disease of native coronary artery without angina pectoris: Secondary | ICD-10-CM | POA: Diagnosis not present

## 2022-09-20 DIAGNOSIS — I480 Paroxysmal atrial fibrillation: Secondary | ICD-10-CM | POA: Insufficient documentation

## 2022-09-20 DIAGNOSIS — I350 Nonrheumatic aortic (valve) stenosis: Secondary | ICD-10-CM | POA: Insufficient documentation

## 2022-09-20 DIAGNOSIS — R5381 Other malaise: Secondary | ICD-10-CM | POA: Diagnosis not present

## 2022-09-20 MED ORDER — FUROSEMIDE 20 MG PO TABS: 20.0000 mg | ORAL_TABLET | Freq: Two times a day (BID) | ORAL | 0 refills | Status: DC

## 2022-09-20 MED ORDER — METOPROLOL SUCCINATE ER 50 MG PO TB24: 50.0000 mg | ORAL_TABLET | Freq: Every day | ORAL | 3 refills | Status: DC

## 2022-09-20 MED ORDER — AMIODARONE HCL 200 MG PO TABS: ORAL_TABLET | ORAL | 3 refills | Status: DC

## 2022-09-20 NOTE — Progress Notes (Signed)
Cardiology Office Note:    Date:  09/20/2022   ID:  ALONTAE CHALOUX, DOB June 21, 1934, MRN [id number]  PCP:  Marin Olp, MD   [facility] Cardiologist:  [name], MD Electrophysiologist:  Will Meredith Leeds, MD     Referring MD: Marin Olp, MD    History of Present Illness:    KASPER MUDRICK is a 89 y.o. male trapped his DOD days here for follow-up fatigue.  Has coronary disease PCI in 2018 to the LAD/RCA.  Has chronic kidney disease, pacemaker, paroxysmal atrial fibrillation.  Recent hospitalization at [facility].  Continues to have fatigue.  Metoprolol was stopped and Toprol-XL 50 mg was started by Dr. Curt Bears back on 09/04/2022.  They wanted him to continue with his amiodarone since he was admitted with atrial fibrillation during the hospitalization.  He is on Eliquis.  I he saw Geraldo Pitter with pulmonary medicine.  Dyspnea was felt to be multifactorial.  He had both restriction and obstruction on breathing test.  No clinical benefit from Symbicort.  He stopped taking.  This is okay.  Stay active.  Past Medical History:  Diagnosis Date   Arthritis    "back, knees" (11/21/2016)   Bladder stones    CAD (coronary artery disease)    11/20/16 PCI with DES--> LAD/RCA   Chronic lower back pain    CKD (chronic kidney disease), stage IV ([place])    [name] 11/08/2016   Dilatation of aorta (HCC)    Elevated hemidiaphragm    History of gout    History of kidney stones 12/02/2008   Hyperlipidemia    Hypertension    Myocardial infarction ([place])    "silent" (11/21/2016)   NSVT (nonsustained ventricular tachycardia) (HCC)    Pacemaker    PAF (paroxysmal atrial fibrillation) ([place])    PSA, INCREASED 12/05/2009   Pulmonary disease    Pulmonary emboli (HCC)    Pulmonary hypertension ([place])    Recurrent deep venous thrombosis (HCC)    UTI (urinary tract infection)     Past Surgical History:  Procedure Laterality Date   CARDIAC CATHETERIZATION  11/14/2016    CATARACT EXTRACTION W/ INTRAOCULAR LENS  IMPLANT, BILATERAL     CORONARY ANGIOPLASTY WITH STENT PLACEMENT  11/21/2016   "2 stents"    CORONARY ATHERECTOMY N/A 11/21/2016   Procedure: Coronary Atherectomy;  Surgeon: Leonie Man, MD;  Location: [facility];  Service: Cardiovascular;  Laterality: N/A;  RCA and LAD   CORONARY STENT INTERVENTION N/A 11/21/2016   Procedure: Coronary Stent Intervention;  Surgeon: Leonie Man, MD;  Location: [place] [facility];  Service: Cardiovascular;  Laterality: N/A;  RCA and LAD   CYSTOSCOPY W/ STONE MANIPULATION     INTRAVASCULAR PRESSURE WIRE/FFR STUDY N/A 05/22/2019   Procedure: INTRAVASCULAR PRESSURE WIRE/FFR STUDY;  Surgeon: Nelva Bush, MD;  Location: [place] [facility];  Service: Cardiovascular;  Laterality: N/A;   JOINT REPLACEMENT     KNEE ARTHROSCOPY Left    LAPAROSCOPIC CHOLECYSTECTOMY     LEFT HEART CATH N/A 11/21/2016   Procedure: Left Heart Cath;  Surgeon: Leonie Man, MD;  Location: [place] [facility];  Service: Cardiovascular;  Laterality: N/A;   LITHOTRIPSY     PILONIDAL CYST EXCISION  58   RIGHT/LEFT HEART CATH AND CORONARY ANGIOGRAPHY N/A 11/14/2016   Procedure: Right/Left Heart Cath and Coronary Angiography;  Surgeon: Leonie Man, MD;  Location: [place] [facility];  Service: Cardiovascular;  Laterality: N/A;   RIGHT/LEFT  HEART CATH AND CORONARY ANGIOGRAPHY N/A 05/22/2019   Procedure: RIGHT/LEFT HEART CATH AND CORONARY ANGIOGRAPHY;  Surgeon: Nelva Bush, MD;  Location: [place] [facility];  Service: Cardiovascular;  Laterality: N/A;   TEMPORARY PACEMAKER N/A 11/21/2016   Procedure: Temporary Pacemaker;  Surgeon: Leonie Man, MD;  Location: [place] [facility];  Service: Cardiovascular;  Laterality: N/A;   TONSILLECTOMY     TOTAL KNEE ARTHROPLASTY Left 09/22/2012   Procedure: TOTAL KNEE ARTHROPLASTY;  Surgeon: Lorn Junes, MD;  Location: [facility];  Service: Orthopedics;  Laterality: Left;    Current  Medications: Current Meds  Medication Sig   allopurinol (ZYLOPRIM) 300 MG tablet TAKE 1 TABLET DAILY   atorvastatin (LIPITOR) 40 MG tablet TAKE 1 TABLET DAILY   diphenhydramine-acetaminophen (TYLENOL PM) 25-500 MG TABS tablet Take 1 tablet by mouth at bedtime as needed (sleep).   ELIQUIS 2.5 MG TABS tablet TAKE 1 TABLET TWICE A DAY   finasteride (PROSCAR) 5 MG tablet Take 5 mg by mouth daily.   nitroGLYCERIN (NITROSTAT) 0.4 MG SL tablet Place 1 tablet (0.4 mg total) under the tongue every 5 (five) minutes as needed for chest pain.   Tamsulosin HCl (FLOMAX) 0.4 MG CAPS Take 0.4 mg by mouth at bedtime.    [DISCONTINUED] amiodarone (PACERONE) 200 MG tablet Take 1 tablet twice a day for 2 weeks then decrease to 1 tablet daily   [DISCONTINUED] furosemide (LASIX) 20 MG tablet TAKE 1 TABLET DAILY   [DISCONTINUED] metoprolol succinate (TOPROL-XL) 50 MG 24 hr tablet Take 1 tablet (50 mg total) by mouth daily. Take with or immediately following a meal.   [DISCONTINUED] ranolazine (RANEXA) 1000 MG SR tablet TAKE 1 TABLET TWICE A DAY     Allergies:   Epinephrine, Penicillins, Lisinopril, and Novocain [procaine]   Social History   Socioeconomic History   Marital status: Married    Spouse name: Not on file   Number of children: Not on file   Years of education: Not on file   Highest education level: Not on file  Occupational History   Not on file  Tobacco Use   Smoking status: Never   Smokeless tobacco: Never  Vaping Use   Vaping Use: Never used  Substance and Sexual Activity   Alcohol use: Not Currently    Comment: 11/21/2016 "nothing since 2015"   Drug use: No   Sexual activity: Yes    Partners: Female  Other Topics Concern   Not on file  Social History Narrative   Married (Wife June patient of Dr. Yong Channel). 2 sons. 4 grandkids.    Originally from near [place], [place] [employer] 6[place]7 months of the year      Retired from [employer] long time. 27 years of service.        Hobbies: golf   Used to [employer], run [employer]   Social Determinants of [employer]: Low Risk  (03/19/2022)   Overall Financial Resource Strain (CARDIA)    Difficulty of Paying Living Expenses: Not hard at all  Food Insecurity: No Food Insecurity (03/19/2022)   Hunger Vital Sign    Worried About Running Out of Food in the Last Year: Never true    Ran Out of Food in the Last Year: Never true  Transportation Needs: No Transportation Needs (03/19/2022)   PRAPARE - Hydrologist (Medical): No    Lack of Transportation (Non-Medical): No  Physical Activity: Insufficiently Active (03/19/2022)  Exercise Vital Sign    Days of Exercise per Week: 5 days    Minutes of Exercise per Session: 20 min  Stress: Stress Concern Present (03/19/2022)   [place]    Feeling of Stress : To some extent  Social Connections: Moderately Isolated (03/19/2022)   Social Connection and Isolation Panel [NHANES]    Frequency of Communication with Friends and Family: More than three times a week    Frequency of Social Gatherings with Friends and Family: More than three times a week    Attends Religious Services: Never    Marine scientist or Organizations: No    Attends Music therapist: Never    Marital Status: Married     Family History: The patient's family history includes Breast cancer in his mother; Heart attack in his father.  ROS:   Please see the history of present illness.     All other systems reviewed and are negative.  EKGs/Labs/Other Studies Reviewed:    The following studies were reviewed today: Echocardiogram 08/25/2022 at [facility]: Adequate 2D M-mode and color-flow Doppler echocardiogram demonstrates  normal left ventricular chamber size and wall motion.  Ejection fraction  is normal.  2.  The aortic valve is heavily calcified with reduced leaflet mobility.    Peak gradient across the valve is approximately 55 to 60 mmHg.  Aortic  valve area calculates to 1.23 cm consistent with moderate aortic  stenosis.  There is mild aortic insufficiency.  3.  The mitral valve is thickened but opens adequately.  Heavy mitral  annular calcification is noted.  There is moderate mitral insufficiency.  4.  The tricuspid valve appears to be normal, there is moderately severe  tricuspid insufficiency.  5.  Mild biatrial enlargement is noted  6.  Right ventricular function is normal  7.  The pericardium is normal in thickness there is no effusion   EKG: No new  Recent Labs: No results found for requested labs within last 365 days.  Recent Lipid Panel    Component Value Date/Time   CHOL 122 11/30/2020 1215   TRIG 106.0 11/30/2020 1215   HDL 42.20 11/30/2020 1215   CHOLHDL 3 11/30/2020 1215   VLDL 21.2 11/30/2020 1215   LDLCALC 58 11/30/2020 1215   LDLDIRECT 79.0 12/08/2014 0905     Risk Assessment/Calculations:          Physical Exam:    VS:  BP 112/70   Pulse 74   Ht '6\' 3"'$  (1.905 m)   Wt 221 lb (100.2 kg)   SpO2 99%   BMI 27.62 kg/m     Wt Readings from Last 3 Encounters:  09/20/22 221 lb (100.2 kg)  09/04/22 228 lb (103.4 kg)  09/04/22 229 lb (103.9 kg)     GEN:  Well nourished, well developed in no acute distress HEENT: Normal NECK: No JVD; No carotid bruits LYMPHATICS: No lymphadenopathy CARDIAC: RRR, 1/6 SM,no rubs, gallops RESPIRATORY:  Clear to auscultation without rales, wheezing or rhonchi  ABDOMEN: Soft, non-tender, non-distended MUSCULOSKELETAL:  left leg edema; No deformity  SKIN: Warm and dry NEUROLOGIC:  Alert and oriented x 3 PSYCHIATRIC:  Normal affect   ASSESSMENT:    1. Physical deconditioning   2. Medication management   3. Paroxysmal atrial fibrillation (HCC)   4. Moderate aortic stenosis    PLAN:    In order of problems listed above:  Fatigue/deconditioning - We will stop ranolazine. -Check CBC  basic  metabolic profile.  Recent TSH was normal. -Order physical therapy, lives in [place]  Paroxysmal atrial fibrillation - Newly discovered.  Currently on amiodarone 200 mg a day.  Continue.  He has seen Dr. Curt Bears.  Pacemaker in place.  History of recurrent DVT - Continue with Eliquis  Sick sinus syndrome status post Medtronic pacemaker 2019 - Stable, Dr. Curt Bears.  Coronary artery disease - No anginal symptoms.  Stopping Ranexa.  Moderate aortic stenosis - Aortic dimensions were normal by CTA earlier this month.             Medication Adjustments/Labs and Tests Ordered: Current medicines are reviewed at length with the patient today.  Concerns regarding medicines are outlined above.  Orders Placed This Encounter  Procedures   CBC   Basic metabolic panel   Ambulatory referral to Physical Therapy   Meds ordered this encounter  Medications   furosemide (LASIX) 20 MG tablet    Sig: Take 1 tablet (20 mg total) by mouth 2 (two) times daily.    Dispense:  180 tablet    Refill:  0   amiodarone (PACERONE) 200 MG tablet    Sig: 1 tablet daily    Dispense:  90 tablet    Refill:  3   metoprolol succinate (TOPROL-XL) 50 MG 24 hr tablet    Sig: Take 1 tablet (50 mg total) by mouth daily. Take with or immediately following a meal.    Dispense:  90 tablet    Refill:  3    Patient Instructions  Medication Instructions:  Please discontinue your Renexa. Continue all other medications as listed.  *If you need a refill on your cardiac medications before your next appointment, please call your pharmacy*  Lab Work: Please have blood work today (CBC, BMP)  If you have labs (blood work) drawn today and your tests are completely normal, you will receive your results only by: MyChart Message (if you have [place]) OR A paper copy in the mail If you have any lab test that is abnormal or we need to change your treatment, we will call you to review the results.  You have  been referred to Physical Therapy and will be contacted to be scheduled.  Follow-Up: At [facility], you and your health needs are our priority.  As part of our continuing mission to provide you with exceptional heart care, we have created designated Provider Care Teams.  These Care Teams include your primary Cardiologist (physician) and Advanced Practice Providers (APPs -  Physician Assistants and Nurse Practitioners) who all work together to provide you with the care you need, when you need it.  We recommend signing up for the patient portal called "MyChart".  Sign up information is provided on this After Visit Summary.  MyChart is used to connect with patients for Virtual Visits (Telemedicine).  Patients are able to view lab/test results, encounter notes, upcoming appointments, etc.  Non-urgent messages can be sent to your provider as well.   To learn more about what you can do with MyChart, go to [email or web address].    Your next appointment:   6 month(s)  Provider:   [name], MD        Signed, [name], MD  09/20/2022 5:10 PM    [place]

## 2022-09-20 NOTE — Patient Instructions (Signed)
Medication Instructions:  Please discontinue your Renexa. Continue all other medications as listed.  *If you need a refill on your cardiac medications before your next appointment, please call your pharmacy*  Lab Work: Please have blood work today (CBC, BMP)  If you have labs (blood work) drawn today and your tests are completely normal, you will receive your results only by: MyChart Message (if you have [place]) OR A paper copy in the mail If you have any lab test that is abnormal or we need to change your treatment, we will call you to review the results.  You have been referred to Physical Therapy and will be contacted to be scheduled.  Follow-Up: At [facility], you and your health needs are our priority.  As part of our continuing mission to provide you with exceptional heart care, we have created designated Provider Care Teams.  These Care Teams include your primary Cardiologist (physician) and Advanced Practice Providers (APPs -  Physician Assistants and Nurse Practitioners) who all work together to provide you with the care you need, when you need it.  We recommend signing up for the patient portal called "MyChart".  Sign up information is provided on this After Visit Summary.  MyChart is used to connect with patients for Virtual Visits (Telemedicine).  Patients are able to view lab/test results, encounter notes, upcoming appointments, etc.  Non-urgent messages can be sent to your provider as well.   To learn more about what you can do with MyChart, go to [email or web address].    Your next appointment:   6 month(s)  Provider:   [name], MD

## 2022-09-21 ENCOUNTER — Encounter: Payer: Medicare Other | Admitting: Cardiology

## 2022-09-21 LAB — CBC
Hematocrit: 41.6 % (ref 37.5–51.0)
Hemoglobin: 13.9 g/dL (ref 13.0–17.7)
MCH: 31.8 pg (ref 26.6–33.0)
MCHC: 33.4 g/dL (ref 31.5–35.7)
MCV: 95 fL (ref 79–97)
Platelets: 178 10*3/uL (ref 150–450)
RBC: 4.37 x10E6/uL (ref 4.14–5.80)
RDW: 13.1 % (ref 11.6–15.4)
WBC: 7.2 10*3/uL (ref 3.4–10.8)

## 2022-09-21 LAB — BASIC METABOLIC PANEL
BUN/Creatinine Ratio: 19 (ref 10–24)
BUN: 37 mg/dL — ABNORMAL HIGH (ref 8–27)
CO2: 24 mmol/L (ref 20–29)
Calcium: 9.7 mg/dL (ref 8.6–10.2)
Chloride: 101 mmol/L (ref 96–106)
Creatinine, Ser: 1.98 mg/dL — ABNORMAL HIGH (ref 0.76–1.27)
Glucose: 106 mg/dL — ABNORMAL HIGH (ref 70–99)
Potassium: 4.2 mmol/L (ref 3.5–5.2)
Sodium: 141 mmol/L (ref 134–144)
eGFR: 32 mL/min/{1.73_m2} — ABNORMAL LOW (ref >59)

## 2022-09-25 ENCOUNTER — Other Ambulatory Visit: Payer: Self-pay

## 2022-09-25 ENCOUNTER — Encounter: Payer: Self-pay | Admitting: Cardiology

## 2022-09-25 MED ORDER — FUROSEMIDE 20 MG PO TABS: 20.0000 mg | ORAL_TABLET | Freq: Two times a day (BID) | ORAL | 3 refills | Status: DC

## 2022-09-25 NOTE — Telephone Encounter (Signed)
Pt's medication was sent to pt's pharmacy as requested. Confirmation received.  °

## 2022-09-28 ENCOUNTER — Encounter: Payer: Medicare Other | Admitting: Cardiology

## 2022-10-09 ENCOUNTER — Encounter: Payer: Medicare Other | Admitting: Cardiology

## 2022-10-16 ENCOUNTER — Ambulatory Visit (INDEPENDENT_AMBULATORY_CARE_PROVIDER_SITE_OTHER): Payer: Medicare Other

## 2022-10-16 DIAGNOSIS — I495 Sick sinus syndrome: Secondary | ICD-10-CM | POA: Diagnosis not present

## 2022-10-17 LAB — CUP PACEART REMOTE DEVICE CHECK
Battery Remaining Longevity: 59 mo
Battery Voltage: 2.89 V
Brady Statistic AP VP Percent: 74.35 %
Brady Statistic AP VS Percent: 25.31 %
Brady Statistic AS VP Percent: 0.18 %
Brady Statistic AS VS Percent: 0.16 %
Brady Statistic RA Percent Paced: 99.72 %
Brady Statistic RV Percent Paced: 74.53 %
Date Time Interrogation Session: 20240304221834
Implantable Lead Connection Status: 753985
Implantable Lead Connection Status: 753985
Implantable Lead Implant Date: 20190116
Implantable Lead Implant Date: 20190116
Implantable Lead Location: 753859
Implantable Lead Location: 753860
Implantable Lead Model: 5076
Implantable Lead Model: 5076
Implantable Pulse Generator Implant Date: 20190116
Lead Channel Impedance Value: 266 Ohm
Lead Channel Impedance Value: 304 Ohm
Lead Channel Impedance Value: 323 Ohm
Lead Channel Impedance Value: 418 Ohm
Lead Channel Pacing Threshold Amplitude: 1 V
Lead Channel Pacing Threshold Amplitude: 1.25 V
Lead Channel Pacing Threshold Pulse Width: 0.4 ms
Lead Channel Pacing Threshold Pulse Width: 0.4 ms
Lead Channel Sensing Intrinsic Amplitude: 0.25 mV
Lead Channel Sensing Intrinsic Amplitude: 0.25 mV
Lead Channel Sensing Intrinsic Amplitude: 7.375 mV
Lead Channel Sensing Intrinsic Amplitude: 7.375 mV
Lead Channel Setting Pacing Amplitude: 2 V
Lead Channel Setting Pacing Amplitude: 2.5 V
Lead Channel Setting Pacing Pulse Width: 0.4 ms
Lead Channel Setting Sensing Sensitivity: 1.2 mV
Zone Setting Status: 755011
Zone Setting Status: 755011

## 2022-10-23 ENCOUNTER — Other Ambulatory Visit (HOSPITAL_COMMUNITY): Payer: Medicare Other

## 2022-10-25 ENCOUNTER — Other Ambulatory Visit: Payer: Self-pay | Admitting: Family Medicine

## 2022-11-13 ENCOUNTER — Ambulatory Visit: Payer: Medicare Other | Admitting: Cardiology

## 2022-11-21 NOTE — Progress Notes (Signed)
Remote pacemaker transmission.   

## 2023-01-15 ENCOUNTER — Ambulatory Visit (INDEPENDENT_AMBULATORY_CARE_PROVIDER_SITE_OTHER): Payer: Medicare Other

## 2023-01-15 DIAGNOSIS — I495 Sick sinus syndrome: Secondary | ICD-10-CM

## 2023-01-15 LAB — CUP PACEART REMOTE DEVICE CHECK
Battery Remaining Longevity: 52 mo
Battery Voltage: 2.89 V
Brady Statistic AP VP Percent: 89.56 %
Brady Statistic AP VS Percent: 10.25 %
Brady Statistic AS VP Percent: 0.08 %
Brady Statistic AS VS Percent: 0.12 %
Brady Statistic RA Percent Paced: 99.87 %
Brady Statistic RV Percent Paced: 89.63 %
Date Time Interrogation Session: 20240602232212
Implantable Lead Connection Status: 753985
Implantable Lead Connection Status: 753985
Implantable Lead Implant Date: 20190116
Implantable Lead Implant Date: 20190116
Implantable Lead Location: 753859
Implantable Lead Location: 753860
Implantable Lead Model: 5076
Implantable Lead Model: 5076
Implantable Pulse Generator Implant Date: 20190116
Lead Channel Impedance Value: 285 Ohm
Lead Channel Impedance Value: 304 Ohm
Lead Channel Impedance Value: 342 Ohm
Lead Channel Impedance Value: 437 Ohm
Lead Channel Pacing Threshold Amplitude: 1.125 V
Lead Channel Pacing Threshold Amplitude: 1.25 V
Lead Channel Pacing Threshold Pulse Width: 0.4 ms
Lead Channel Pacing Threshold Pulse Width: 0.4 ms
Lead Channel Sensing Intrinsic Amplitude: 0.25 mV
Lead Channel Sensing Intrinsic Amplitude: 0.25 mV
Lead Channel Sensing Intrinsic Amplitude: 2.5 mV
Lead Channel Sensing Intrinsic Amplitude: 2.5 mV
Lead Channel Setting Pacing Amplitude: 2.25 V
Lead Channel Setting Pacing Amplitude: 2.5 V
Lead Channel Setting Pacing Pulse Width: 0.4 ms
Lead Channel Setting Sensing Sensitivity: 1.2 mV
Zone Setting Status: 755011
Zone Setting Status: 755011

## 2023-02-07 NOTE — Progress Notes (Signed)
Remote pacemaker transmission.   

## 2023-02-21 ENCOUNTER — Other Ambulatory Visit: Payer: Self-pay | Admitting: Family Medicine

## 2023-03-25 ENCOUNTER — Ambulatory Visit (INDEPENDENT_AMBULATORY_CARE_PROVIDER_SITE_OTHER): Payer: Medicare Other

## 2023-03-25 VITALS — Wt 221.0 lb

## 2023-03-25 DIAGNOSIS — Z0000 Encounter for general adult medical examination without abnormal findings: Secondary | ICD-10-CM | POA: Diagnosis not present

## 2023-03-25 NOTE — Patient Instructions (Signed)
[name] , Thank you for taking time to come for your Medicare Wellness Visit. I appreciate your ongoing commitment to your health goals. Please review the following plan we discussed and let me know if I can assist you in the future.   Referrals/Orders/Follow-Ups/Clinician Recommendations: stay healthy and active  This is a list of the screening recommended for you and due dates:  Health Maintenance  Topic Date Due   DTaP/Tdap/Td vaccine (2 - Tdap) 12/06/2019   COVID-19 Vaccine (4 - 2023-24 season) 04/13/2022   Zoster (Shingles) Vaccine (2 of 2) 08/08/2022   Flu Shot  03/14/2023   Medicare Annual Wellness Visit  03/24/2024   Pneumonia Vaccine  Completed   HPV Vaccine  Aged Out    Advanced directives: (Copy Requested) Please bring a copy of your health care power of attorney and living will to the office to be added to your chart at your convenience.  Next Medicare Annual Wellness Visit scheduled for next year: Yes  Preventive Care 3 Years and Older, Male  Preventive care refers to lifestyle choices and visits with your health care provider that can promote health and wellness. What does preventive care include? A yearly physical exam. This is also called an annual well check. Dental exams once or twice a year. Routine eye exams. Ask your health care provider how often you should have your eyes checked. Personal lifestyle choices, including: Daily care of your teeth and gums. Regular physical activity. Eating a healthy diet. Avoiding tobacco and drug use. Limiting alcohol use. Practicing safe sex. Taking low doses of aspirin every day. Taking vitamin and mineral supplements as recommended by your health care provider. What happens during an annual well check? The services and screenings done by your health care provider during your annual well check will depend on your age, overall health, lifestyle risk factors, and family history of disease. Counseling  Your health care  provider may ask you questions about your: Alcohol use. Tobacco use. Drug use. Emotional well-being. Home and relationship well-being. Sexual activity. Eating habits. History of falls. Memory and ability to understand (cognition). Work and work Astronomer. Screening  You may have the following tests or measurements: Height, weight, and BMI. Blood pressure. Lipid and cholesterol levels. These may be checked every 5 years, or more frequently if you are over 40 years old. Skin check. Lung cancer screening. You may have this screening every year starting at age 67 if you have a 30-pack-year history of smoking and currently smoke or have quit within the past 15 years. Fecal occult blood test (FOBT) of the stool. You may have this test every year starting at age 74. Flexible sigmoidoscopy or colonoscopy. You may have a sigmoidoscopy every 5 years or a colonoscopy every 10 years starting at age 14. Prostate cancer screening. Recommendations will vary depending on your family history and other risks. Hepatitis C blood test. Hepatitis B blood test. Sexually transmitted disease (STD) testing. Diabetes screening. This is done by checking your blood sugar (glucose) after you have not eaten for a while (fasting). You may have this done every 1-3 years. Abdominal aortic aneurysm (AAA) screening. You may need this if you are a current or former smoker. Osteoporosis. You may be screened starting at age 74 if you are at high risk. Talk with your health care provider about your test results, treatment options, and if necessary, the need for more tests. Vaccines  Your health care provider may recommend certain vaccines, such as: Influenza vaccine. This is  recommended every year. Tetanus, diphtheria, and acellular pertussis (Tdap, Td) vaccine. You may need a Td booster every 10 years. Zoster vaccine. You may need this after age 79. Pneumococcal 13-valent conjugate (PCV13) vaccine. One dose is  recommended after age 30. Pneumococcal polysaccharide (PPSV23) vaccine. One dose is recommended after age 68. Talk to your health care provider about which screenings and vaccines you need and how often you need them. This information is not intended to replace advice given to you by your health care provider. Make sure you discuss any questions you have with your health care provider. Document Released: 08/26/2015 Document Revised: 04/18/2016 Document Reviewed: 05/31/2015 Elsevier Interactive Patient Education  2017 [employer].  Fall Prevention in the Home Falls can cause injuries. They can happen to people of all ages. There are many things you can do to make your home safe and to help prevent falls. What can I do on the outside of my home? Regularly fix the edges of walkways and driveways and fix any cracks. Remove anything that might make you trip as you walk through a door, such as a raised step or threshold. Trim any bushes or trees on the path to your home. Use bright outdoor lighting. Clear any walking paths of anything that might make someone trip, such as rocks or tools. Regularly check to see if handrails are loose or broken. Make sure that both sides of any steps have handrails. Any raised decks and porches should have guardrails on the edges. Have any leaves, snow, or ice cleared regularly. Use sand or salt on walking paths during winter. Clean up any spills in your garage right away. This includes oil or grease spills. What can I do in the bathroom? Use night lights. Install grab bars by the toilet and in the tub and shower. Do not use towel bars as grab bars. Use non-skid mats or decals in the tub or shower. If you need to sit down in the shower, use a plastic, non-slip stool. Keep the floor dry. Clean up any water that spills on the floor as soon as it happens. Remove soap buildup in the tub or shower regularly. Attach bath mats securely with double-sided non-slip rug  tape. Do not have throw rugs and other things on the floor that can make you trip. What can I do in the bedroom? Use night lights. Make sure that you have a light by your bed that is easy to reach. Do not use any sheets or blankets that are too big for your bed. They should not hang down onto the floor. Have a firm chair that has side arms. You can use this for support while you get dressed. Do not have throw rugs and other things on the floor that can make you trip. What can I do in the kitchen? Clean up any spills right away. Avoid walking on wet floors. Keep items that you use a lot in easy-to-reach places. If you need to reach something above you, use a strong step stool that has a grab bar. Keep electrical cords out of the way. Do not use floor polish or wax that makes floors slippery. If you must use wax, use non-skid floor wax. Do not have throw rugs and other things on the floor that can make you trip. What can I do with my stairs? Do not leave any items on the stairs. Make sure that there are handrails on both sides of the stairs and use them. Fix handrails that are  broken or loose. Make sure that handrails are as long as the stairways. Check any carpeting to make sure that it is firmly attached to the stairs. Fix any carpet that is loose or worn. Avoid having throw rugs at the top or bottom of the stairs. If you do have throw rugs, attach them to the floor with carpet tape. Make sure that you have a light switch at the top of the stairs and the bottom of the stairs. If you do not have them, ask someone to add them for you. What else can I do to help prevent falls? Wear shoes that: Do not have high heels. Have rubber bottoms. Are comfortable and fit you well. Are closed at the toe. Do not wear sandals. If you use a stepladder: Make sure that it is fully opened. Do not climb a closed stepladder. Make sure that both sides of the stepladder are locked into place. Ask someone to  hold it for you, if possible. Clearly mark and make sure that you can see: Any grab bars or handrails. First and last steps. Where the edge of each step is. Use tools that help you move around (mobility aids) if they are needed. These include: Canes. Walkers. Scooters. Crutches. Turn on the lights when you go into a dark area. Replace any light bulbs as soon as they burn out. Set up your furniture so you have a clear path. Avoid moving your furniture around. If any of your floors are uneven, fix them. If there are any pets around you, be aware of where they are. Review your medicines with your doctor. Some medicines can make you feel dizzy. This can increase your chance of falling. Ask your doctor what other things that you can do to help prevent falls. This information is not intended to replace advice given to you by your health care provider. Make sure you discuss any questions you have with your health care provider. Document Released: 05/26/2009 Document Revised: 01/05/2016 Document Reviewed: 09/03/2014 Elsevier Interactive Patient Education  2017 [employer].

## 2023-03-25 NOTE — Progress Notes (Signed)
Subjective:   [name] is a 89 y.o. male who presents for Medicare Annual/Subsequent preventive examination.  Visit Complete: Virtual  I connected with  [name] on 03/25/23 by a audio enabled telemedicine application and verified that I am speaking with the correct person using two identifiers.  Patient Location: Home  Provider Location: Office/Clinic  I discussed the limitations of evaluation and management by telemedicine. The patient expressed understanding and agreed to proceed.  Vital Signs: Unable to obtain new vitals due to this being a telehealth visit.  Review of Systems     Cardiac Risk Factors include: advanced age (>51men, >13 women);dyslipidemia;hypertension;male gender     Objective:    Today's Vitals   03/25/23 0833  Weight: 221 lb (100.2 kg)   Body mass index is 27.62 kg/m.     03/25/2023    8:37 AM 03/19/2022    8:47 AM 03/06/2021    8:54 AM 11/19/2019   10:20 AM 07/04/2019    2:19 AM 11/21/2016   10:26 AM 11/14/2016   11:17 AM  Advanced Directives  Does Patient Have a Medical Advance Directive? Yes Yes Yes Yes Yes Yes Yes  Type of Estate agent of [address];Living will Healthcare Power of [place];Living will Healthcare Power of [place];Living will Living will;Healthcare Power of Attorney Living will Healthcare Power of Attorney Living will;Healthcare Power of Attorney  Does patient want to make changes to medical advance directive?    No - Patient declined  No - Patient declined No - Patient declined  Copy of Healthcare Power of Attorney in Chart? No - copy requested No - copy requested No - copy requested No - copy requested  No - copy requested No - copy requested    Current Medications (verified) Outpatient Encounter Medications as of 03/25/2023  Medication Sig   allopurinol (ZYLOPRIM) 300 MG tablet TAKE 1 TABLET DAILY   amiodarone (PACERONE) 200 MG tablet 1 tablet daily   atorvastatin (LIPITOR) 40 MG tablet TAKE 1  TABLET DAILY   diphenhydramine-acetaminophen (TYLENOL PM) 25-500 MG TABS tablet Take 1 tablet by mouth at bedtime as needed (sleep).   ELIQUIS 2.5 MG TABS tablet TAKE 1 TABLET TWICE A DAY   finasteride (PROSCAR) 5 MG tablet Take 5 mg by mouth daily.   furosemide (LASIX) 20 MG tablet Take 1 tablet (20 mg total) by mouth 2 (two) times daily.   metoprolol succinate (TOPROL-XL) 50 MG 24 hr tablet Take 1 tablet (50 mg total) by mouth daily. Take with or immediately following a meal.   nitroGLYCERIN (NITROSTAT) 0.4 MG SL tablet Place 1 tablet (0.4 mg total) under the tongue every 5 (five) minutes as needed for chest pain.   pantoprazole (PROTONIX) 40 MG tablet Take by mouth.   Tamsulosin HCl (FLOMAX) 0.4 MG CAPS Take 0.4 mg by mouth at bedtime.    No facility-administered encounter medications on file as of 03/25/2023.    Allergies (verified) Epinephrine, Penicillins, Lisinopril, and Novocain [procaine]   History: Past Medical History:  Diagnosis Date   Arthritis    "back, knees" (11/21/2016)   Bladder stones    CAD (coronary artery disease)    11/20/16 PCI with DES--> LAD/RCA   Chronic lower back pain    CKD (chronic kidney disease), stage IV (HCC)    Hattie Perch 11/08/2016   Dilatation of aorta (HCC)    Elevated hemidiaphragm    History of gout    History of kidney stones 12/02/2008   Hyperlipidemia    Hypertension  Myocardial infarction [facility])    "silent" (11/21/2016)   NSVT (nonsustained ventricular tachycardia) (HCC)    Pacemaker    PAF (paroxysmal atrial fibrillation) (HCC)    PSA, INCREASED 12/05/2009   Pulmonary disease    Pulmonary emboli (HCC)    Pulmonary hypertension (HCC)    Recurrent deep venous thrombosis (HCC)    UTI (urinary tract infection)    Past Surgical History:  Procedure Laterality Date   CARDIAC CATHETERIZATION  11/14/2016   CATARACT EXTRACTION W/ INTRAOCULAR LENS  IMPLANT, BILATERAL     CORONARY ANGIOPLASTY WITH STENT PLACEMENT  11/21/2016   "2 stents"     CORONARY ATHERECTOMY N/A 11/21/2016   Procedure: Coronary Atherectomy;  Surgeon: Marykay Lex, MD;  Location: [facility] INVASIVE CV LAB;  Service: Cardiovascular;  Laterality: N/A;  RCA and LAD   CORONARY PRESSURE/FFR STUDY N/A 05/22/2019   Procedure: INTRAVASCULAR PRESSURE WIRE/FFR STUDY;  Surgeon: Yvonne Kendall, MD;  Location: [facility] INVASIVE CV LAB;  Service: Cardiovascular;  Laterality: N/A;   CORONARY STENT INTERVENTION N/A 11/21/2016   Procedure: Coronary Stent Intervention;  Surgeon: Marykay Lex, MD;  Location: [facility] INVASIVE CV LAB;  Service: Cardiovascular;  Laterality: N/A;  RCA and LAD   CYSTOSCOPY W/ STONE MANIPULATION     JOINT REPLACEMENT     KNEE ARTHROSCOPY Left    LAPAROSCOPIC CHOLECYSTECTOMY     LEFT HEART CATH N/A 11/21/2016   Procedure: Left Heart Cath;  Surgeon: Marykay Lex, MD;  Location: [facility] INVASIVE CV LAB;  Service: Cardiovascular;  Laterality: N/A;   LITHOTRIPSY     PILONIDAL CYST EXCISION  58   RIGHT/LEFT HEART CATH AND CORONARY ANGIOGRAPHY N/A 11/14/2016   Procedure: Right/Left Heart Cath and Coronary Angiography;  Surgeon: Marykay Lex, MD;  Location: [facility] INVASIVE CV LAB;  Service: Cardiovascular;  Laterality: N/A;   RIGHT/LEFT HEART CATH AND CORONARY ANGIOGRAPHY N/A 05/22/2019   Procedure: RIGHT/LEFT HEART CATH AND CORONARY ANGIOGRAPHY;  Surgeon: Yvonne Kendall, MD;  Location: [facility] INVASIVE CV LAB;  Service: Cardiovascular;  Laterality: N/A;   TEMPORARY PACEMAKER N/A 11/21/2016   Procedure: Temporary Pacemaker;  Surgeon: Marykay Lex, MD;  Location: [facility] INVASIVE CV LAB;  Service: Cardiovascular;  Laterality: N/A;   TONSILLECTOMY     TOTAL KNEE ARTHROPLASTY Left 09/22/2012   Procedure: TOTAL KNEE ARTHROPLASTY;  Surgeon: Nilda Simmer, MD;  Location: [facility] OR;  Service: Orthopedics;  Laterality: Left;   Family History  Problem Relation Age of Onset   Breast cancer Mother    Heart attack Father        1, smoker   Social History   Socioeconomic History   Marital  status: Married    Spouse name: Not on file   Number of children: Not on file   Years of education: Not on file   Highest education level: Not on file  Occupational History   Not on file  Tobacco Use   Smoking status: Never   Smokeless tobacco: Never  Vaping Use   Vaping status: Never Used  Substance and Sexual Activity   Alcohol use: Not Currently    Comment: 11/21/2016 "nothing since 2015"   Drug use: No   Sexual activity: Yes    Partners: Female  Other Topics Concern   Not on file  Social History Narrative   Married (Wife June patient of Dr. Durene Cal). 2 sons. 4 grandkids.    Originally from [place], [place]   Lives at [employer] 6-7 months of the year  Retired from [employer] long time. 27 years of service.       Hobbies: golf   Used to [employer], run [employer]   Social Determinants of [employer]: Low Risk  (03/25/2023)   Overall Financial Resource Strain (CARDIA)    Difficulty of Paying Living Expenses: Not hard at all  Food Insecurity: No Food Insecurity (03/25/2023)   Hunger Vital Sign    Worried About Running Out of Food in the Last Year: Never true    Ran Out of Food in the Last Year: Never true  Transportation Needs: No Transportation Needs (03/25/2023)   PRAPARE - Administrator, Civil Service (Medical): No    Lack of Transportation (Non-Medical): No  Physical Activity: Sufficiently Active (03/25/2023)   Exercise Vital Sign    Days of Exercise per Week: 7 days    Minutes of Exercise per Session: 30 min  Stress: No Stress Concern Present (03/25/2023)   [employer] of Occupational Health - Occupational Stress Questionnaire    Feeling of Stress : Not at all  Social Connections: Socially Integrated (03/25/2023)   Social Connection and Isolation Panel [NHANES]    Frequency of Communication with Friends and Family: More than three times a week    Frequency of Social Gatherings with Friends and Family: More  than three times a week    Attends Religious Services: More than 4 times per year    Active Member of [employer] or Organizations: Yes    Attends Banker Meetings: 1 to 4 times per year    Marital Status: Married    Tobacco Counseling Counseling given: Not Answered   Clinical Intake:  Pre-visit preparation completed: Yes  Pain : No/denies pain     BMI - recorded: 27.62 Nutritional Status: BMI 25 -29 Overweight Nutritional Risks: None Diabetes: No  How often do you need to have someone help you when you read instructions, pamphlets, or other written materials from your doctor or pharmacy?: 1 - Never  Interpreter Needed?: No  Information entered by :: Lanier Ensign, LPN   Activities of Daily Living    03/25/2023    8:34 AM  In your present state of health, do you have any difficulty performing the following activities:  Hearing? 1  Comment HOH  Vision? 0  Difficulty concentrating or making decisions? 0  Walking or climbing stairs? 0  Dressing or bathing? 0  Doing errands, shopping? 0  Preparing Food and eating ? N  Using the Toilet? N  In the past six months, have you accidently leaked urine? N  Do you have problems with loss of bowel control? N  Managing your Medications? N  Managing your Finances? N  Housekeeping or managing your Housekeeping? N    Patient Care Team: Shelva Majestic, MD as PCP - General (Family Medicine) Regan Lemming, MD as PCP - Electrophysiology (Cardiology) Jake Bathe, MD as PCP - Cardiology (Cardiology) Deterding, Fayrene Fearing, MD as Consulting Physician (Nephrology) [facility] as Consulting Physician (Ophthalmology) Ronne Binning Mardene Celeste, MD as Consulting Physician (Urology)  Indicate any recent Medical Services you may have received from other than [facility] providers in the past year (date may be approximate).     Assessment:   This is a routine wellness examination for Randie.  Hearing/Vision screen Hearing  Screening - Comments:: Pt stated HOH  Vision Screening - Comments:: Pt follows up  with Dr Nile Riggs for annual eye exams   Dietary issues and  exercise activities discussed:     Goals Addressed             This Visit's Progress    Patient Stated       Stay healthy and active        Depression Screen    03/25/2023    8:37 AM 03/19/2022    8:46 AM 03/06/2021    8:52 AM 11/30/2020   11:14 AM 11/19/2019   10:21 AM 09/14/2019    9:46 AM 11/18/2018    2:01 PM  PHQ 2/9 Scores  PHQ - 2 Score 0 0 0 0 0 0 0    Fall Risk    03/25/2023    8:38 AM 03/19/2022    8:48 AM 03/06/2021    8:55 AM 11/30/2020   11:14 AM 01/29/2020    1:18 PM  Fall Risk   Falls in the past year? 0 0 0 0 0  Number falls in past yr: 0 0 0 0 0  Injury with Fall? 0 0 0 0 0  Risk for fall due to : Impaired vision  Impaired vision    Follow up Falls prevention discussed Falls prevention discussed Falls prevention discussed      MEDICARE RISK AT HOME:  Medicare Risk at Home - 03/25/23 0839     Any stairs in or around the home? Yes    If so, are there any without handrails? No    Home free of loose throw rugs in walkways, pet beds, electrical cords, etc? Yes    Adequate lighting in your home to reduce risk of falls? Yes    Life alert? No    Use of a cane, walker or w/c? No    Grab bars in the bathroom? Yes    Shower chair or bench in shower? No    Elevated toilet seat or a handicapped toilet? No             TIMED UP AND GO:  Was the test performed?  No    Cognitive Function:        03/25/2023    8:39 AM 03/19/2022    8:49 AM 03/06/2021    8:57 AM 11/19/2019   10:21 AM  6CIT Screen  What Year? 0 points 0 points 0 points 0 points  What month? 0 points 0 points 0 points 0 points  What time? 0 points 0 points 0 points 0 points  Count back from 20 0 points 0 points 0 points 0 points  Months in reverse 0 points 0 points 0 points 0 points  Repeat phrase 0 points 0 points 0 points 0 points  Total Score 0  points 0 points 0 points 0 points    Immunizations Immunization History  Administered Date(s) Administered   Fluad Quad(high Dose 65+) 04/25/2021   Influenza Whole 04/25/2009, 06/21/2010, 07/01/2011   Influenza, High Dose Seasonal PF 04/22/2014, 06/10/2017, 05/06/2018, 05/12/2020, 06/13/2022   Influenza,inj,Quad PF,6+ Mos 06/17/2013   Influenza-Unspecified 06/20/2000, 05/13/2002, 06/08/2003, 06/06/2004, 07/13/2006, 04/25/2011, 06/17/2013, 04/22/2014, 05/22/2016, 06/10/2017, 05/01/2019   Moderna Sars-Covid-2 Vaccination 08/19/2019, 09/16/2019, 04/08/2020   Pneumococcal Conjugate-13 12/28/2013   Pneumococcal Polysaccharide-23 10/22/2007   Pneumococcal-Unspecified 04/07/2001   Td 12/05/2009   Zoster Recombinant(Shingrix) 06/13/2022    TDAP status: Due, Education has been provided regarding the importance of this vaccine. Advised may receive this vaccine at local pharmacy or Health Dept. Aware to provide a copy of the vaccination record if obtained from local pharmacy or Health Dept. Verbalized acceptance and understanding.  Flu Vaccine status: Due, Education has been provided regarding the importance of this vaccine. Advised may receive this vaccine at local pharmacy or Health Dept. Aware to provide a copy of the vaccination record if obtained from local pharmacy or Health Dept. Verbalized acceptance and understanding.  Pneumococcal vaccine status: Up to date  Covid-19 vaccine status: Completed vaccines  Qualifies for Shingles Vaccine? Yes   Zostavax completed Yes   Shingrix Completed?: No.    Education has been provided regarding the importance of this vaccine. Patient has been advised to call insurance company to determine out of pocket expense if they have not yet received this vaccine. Advised may also receive vaccine at local pharmacy or Health Dept. Verbalized acceptance and understanding.  Screening Tests Health Maintenance  Topic Date Due   DTaP/Tdap/Td (2 - Tdap) 12/06/2019    COVID-19 Vaccine (4 - 2023-24 season) 04/13/2022   Zoster Vaccines- Shingrix (2 of 2) 08/08/2022   INFLUENZA VACCINE  03/14/2023   Medicare Annual Wellness (AWV)  03/24/2024   Pneumonia Vaccine 32+ Years old  Completed   HPV VACCINES  Aged Out    Health Maintenance  Health Maintenance Due  Topic Date Due   DTaP/Tdap/Td (2 - Tdap) 12/06/2019   COVID-19 Vaccine (4 - 2023-24 season) 04/13/2022   Zoster Vaccines- Shingrix (2 of 2) 08/08/2022   INFLUENZA VACCINE  03/14/2023    Colorectal cancer screening: No longer required.   Additional Screening:   Vision Screening: Recommended annual ophthalmology exams for early detection of glaucoma and other disorders of the eye. Is the patient up to date with their annual eye exam?  Yes  Who is the provider or what is the name of the office in which the patient attends annual eye exams? Dr Nile Riggs  If pt is not established with a provider, would they like to be referred to a provider to establish care? No .   Dental Screening: Recommended annual dental exams for proper oral hygiene    Community Resource Referral / Chronic Care Management: CRR required this visit?  No   CCM required this visit?  No     Plan:     I have personally reviewed and noted the following in the patient's chart:   Medical and social history Use of alcohol, tobacco or illicit drugs  Current medications and supplements including opioid prescriptions. Patient is not currently taking opioid prescriptions. Functional ability and status Nutritional status Physical activity Advanced directives List of other physicians Hospitalizations, surgeries, and ER visits in previous 12 months Vitals Screenings to include cognitive, depression, and falls Referrals and appointments  In addition, I have reviewed and discussed with patient certain preventive protocols, quality metrics, and best practice recommendations. A written personalized care plan for preventive  services as well as general preventive health recommendations were provided to patient.     Marzella Schlein, LPN   1/61/0960   After Visit Summary: (MyChart) Due to this being a telephonic visit, the after visit summary with patients personalized plan was offered to patient via MyChart   Nurse Notes: none

## 2023-03-26 ENCOUNTER — Ambulatory Visit: Payer: Medicare Other | Admitting: Cardiology

## 2023-04-16 ENCOUNTER — Ambulatory Visit (INDEPENDENT_AMBULATORY_CARE_PROVIDER_SITE_OTHER): Payer: Medicare Other

## 2023-04-16 DIAGNOSIS — I495 Sick sinus syndrome: Secondary | ICD-10-CM

## 2023-04-17 LAB — CUP PACEART REMOTE DEVICE CHECK
Battery Remaining Longevity: 47 mo
Battery Voltage: 2.88 V
Brady Statistic AP VP Percent: 98.8 %
Brady Statistic AP VS Percent: 1 %
Brady Statistic AS VP Percent: 0.1 %
Brady Statistic AS VS Percent: 0.11 %
Brady Statistic RA Percent Paced: 99.89 %
Brady Statistic RV Percent Paced: 98.89 %
Date Time Interrogation Session: 20240831232315
Implantable Lead Connection Status: 753985
Implantable Lead Connection Status: 753985
Implantable Lead Implant Date: 20190116
Implantable Lead Implant Date: 20190116
Implantable Lead Location: 753859
Implantable Lead Location: 753860
Implantable Lead Model: 5076
Implantable Lead Model: 5076
Implantable Pulse Generator Implant Date: 20190116
Lead Channel Impedance Value: 285 Ohm
Lead Channel Impedance Value: 304 Ohm
Lead Channel Impedance Value: 342 Ohm
Lead Channel Impedance Value: 418 Ohm
Lead Channel Pacing Threshold Amplitude: 1.125 V
Lead Channel Pacing Threshold Amplitude: 1.25 V
Lead Channel Pacing Threshold Pulse Width: 0.4 ms
Lead Channel Pacing Threshold Pulse Width: 0.4 ms
Lead Channel Sensing Intrinsic Amplitude: 0.25 mV
Lead Channel Sensing Intrinsic Amplitude: 0.25 mV
Lead Channel Sensing Intrinsic Amplitude: 8.625 mV
Lead Channel Sensing Intrinsic Amplitude: 8.625 mV
Lead Channel Setting Pacing Amplitude: 2.25 V
Lead Channel Setting Pacing Amplitude: 2.5 V
Lead Channel Setting Pacing Pulse Width: 0.4 ms
Lead Channel Setting Sensing Sensitivity: 1.2 mV
Zone Setting Status: 755011
Zone Setting Status: 755011

## 2023-04-24 NOTE — Progress Notes (Signed)
Remote pacemaker transmission.   

## 2023-05-14 DIAGNOSIS — R58 Hemorrhage, not elsewhere classified: Secondary | ICD-10-CM | POA: Diagnosis not present

## 2023-05-14 DIAGNOSIS — S81811A Laceration without foreign body, right lower leg, initial encounter: Secondary | ICD-10-CM | POA: Diagnosis not present

## 2023-05-14 DIAGNOSIS — R42 Dizziness and giddiness: Secondary | ICD-10-CM | POA: Diagnosis not present

## 2023-05-14 DIAGNOSIS — W19XXXA Unspecified fall, initial encounter: Secondary | ICD-10-CM | POA: Diagnosis not present

## 2023-05-14 DIAGNOSIS — I959 Hypotension, unspecified: Secondary | ICD-10-CM | POA: Diagnosis not present

## 2023-05-14 DIAGNOSIS — W228XXA Striking against or struck by other objects, initial encounter: Secondary | ICD-10-CM | POA: Diagnosis not present

## 2023-05-14 DIAGNOSIS — Z79899 Other long term (current) drug therapy: Secondary | ICD-10-CM | POA: Diagnosis not present

## 2023-05-14 DIAGNOSIS — Z7901 Long term (current) use of anticoagulants: Secondary | ICD-10-CM | POA: Diagnosis not present

## 2023-05-14 DIAGNOSIS — S81812A Laceration without foreign body, left lower leg, initial encounter: Secondary | ICD-10-CM | POA: Diagnosis not present

## 2023-05-17 ENCOUNTER — Encounter: Payer: Self-pay | Admitting: Family Medicine

## 2023-05-18 DIAGNOSIS — S81812A Laceration without foreign body, left lower leg, initial encounter: Secondary | ICD-10-CM | POA: Diagnosis not present

## 2023-05-18 DIAGNOSIS — L03116 Cellulitis of left lower limb: Secondary | ICD-10-CM | POA: Diagnosis not present

## 2023-05-20 ENCOUNTER — Other Ambulatory Visit: Payer: Self-pay

## 2023-05-20 DIAGNOSIS — T148XXA Other injury of unspecified body region, initial encounter: Secondary | ICD-10-CM | POA: Diagnosis not present

## 2023-05-20 DIAGNOSIS — S81812A Laceration without foreign body, left lower leg, initial encounter: Secondary | ICD-10-CM | POA: Diagnosis not present

## 2023-05-20 DIAGNOSIS — S81802A Unspecified open wound, left lower leg, initial encounter: Secondary | ICD-10-CM

## 2023-05-20 DIAGNOSIS — L089 Local infection of the skin and subcutaneous tissue, unspecified: Secondary | ICD-10-CM | POA: Diagnosis not present

## 2023-05-21 DIAGNOSIS — R3914 Feeling of incomplete bladder emptying: Secondary | ICD-10-CM | POA: Diagnosis not present

## 2023-05-21 DIAGNOSIS — N401 Enlarged prostate with lower urinary tract symptoms: Secondary | ICD-10-CM | POA: Diagnosis not present

## 2023-05-21 NOTE — Progress Notes (Unsigned)
Cardiology Office Note    Date:  05/22/2023  ID:  ZAEEM WINSTANLEY, DOB Dec 13, 1933, MRN [id number] PCP:  Shelva Majestic, MD  Cardiologist:  Donato Schultz, MD  Electrophysiologist:  Regan Lemming, MD   Chief Complaint: f/u CAD, afib, CHF  History of Present Illness: .    [name] is a 89 y.o. 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   He had prior DES to RCA/LAD in 2018. Last cath 2020 showed 60% ISR with borderline DFR, mild to moderate non-obstructive coronary artery disease of LAD and LCx, mildly elevated L&R heart and PA pressures, mild-moderate AS, & mildly reduced CO/CI. Medical therapy recommended, reserving PCI to RCA ISR for refractory angina. He also has hx of DVT/PE in 2018 at outside facility. In 11/2020 he was admitted to outside facility with hypoxia/CAP. CTA was negative for PE. Post-hospital duplex with primary care was positive for LLE DVT - had been reportedly off Eliquis for 3 days during hospitalization. The dose was later reduced by EP due to Cr >1.5. I had reviewed with Dr. Anne Fu, given hx of DVT, who also agreed with this change. I met him in 07/2022 following mechanical fall carrying a laundry basket down stairs. The patient had a remote history of PAF, then was admitted 08/2022 at OSH with a/c HFpEF and recurrnet atrial fibrillation prompting initiation of amiodarone and IV Lasix. 2D echo 08/25/22 showed EF 55-60%, moderate AS, moderate MR, moderate-severe TR. Lopressor was changed  to Toprol 08/2022 EP visit and Ranexa stopped 09/2022 due to fatigue. He has also seen pulmonology for multifactorial dyspnea.   He is seen for follow-up today doing well from a cardiac standpoint. He reports his chronic dyspnea is unchanged (occurs with higher levels of exertion, not normal activity). No recent CP, palpitations, syncope, dizziness. He had a leg laceration last Tuesday while at [facility] where he caught his leg on a metal projection on the side of the hotel bed. He required stitches. He was on clindamycin then transitioned to doxycyline a few days ago. He also had cleansing/debridement and wound culturing on 10/7, results not available. He goes back for re-evaluation of his leg tomorrow. He's also been referred to a wound clinic. Initial BP 101/60 by CMA, recheck by me 112/72.    Labwork independently reviewed: 09/2022 K 4.2, Cr 1.98, CBC wnl 08/2022 Mg 2.0, alb 3.3, AST ALT wnl, TSH wnl 2022 LDL 58, trig 106  ROS: .    Please see the history of present illness.  All other systems are reviewed and otherwise negative.  Studies Reviewed: [name]    EKG:  EKG is ordered today, personally reviewed, demonstrating atrial paced, ventricularly sensed rhythm with RBBB, LAFB, difuse TWI inferiorly and V3-V6, QTc in setting of QRS duration  CV Studies: Cardiac studies reviewed are outlined and summarized above. Otherwise please see EMR for full report.   Current Reported Medications:.    Current Meds  Medication Sig   allopurinol (ZYLOPRIM) 300 MG tablet TAKE 1 TABLET DAILY  atorvastatin (LIPITOR) 40 MG tablet TAKE 1 TABLET DAILY   diphenhydramine-acetaminophen (TYLENOL PM) 25-500 MG TABS tablet Take 1 tablet by mouth at bedtime as needed (sleep).   doxycycline (ADOXA) 100 MG tablet Take 100 mg by mouth 2 (two) times daily.   ELIQUIS 2.5 MG TABS tablet TAKE 1 TABLET TWICE A DAY   finasteride (PROSCAR) 5 MG tablet Take 5 mg by mouth daily.   furosemide (LASIX) 20 MG tablet  Take 1 tablet (20 mg total) by mouth 2 (two) times daily.   metoprolol succinate (TOPROL-XL) 50 MG 24 hr tablet Take 1 tablet (50 mg total) by mouth daily. Take with or immediately following a meal.   mupirocin ointment (BACTROBAN) 2 % Apply 1 Application topically daily.   nitroGLYCERIN (NITROSTAT) 0.4 MG SL tablet Place 1 tablet (0.4 mg total) under the tongue every 5 (five) minutes as needed for chest pain.   Tamsulosin HCl (FLOMAX) 0.4 MG CAPS Take 0.4 mg by mouth at bedtime.    [DISCONTINUED] amiodarone (PACERONE) 200 MG tablet 1 tablet daily    Physical Exam:    VS:  BP 112/72   Pulse 65   Ht 6\' 3"  (1.905 m)   Wt 233 lb 12.8 oz (106.1 kg)   SpO2 96%   BMI 29.22 kg/m    Wt Readings from Last 3 Encounters:  05/22/23 233 lb 12.8 oz (106.1 kg)  03/25/23 221 lb (100.2 kg)  09/20/22 221 lb (100.2 kg)    GEN: Well nourished, well developed in no acute distress NECK: No JVD; No carotid bruits CARDIAC: RRR, 2/6 SEM without rubs or gallops, preserved S2 RESPIRATORY: Decreased BS R base, otherwise clear to auscultation without rales, wheezing or rhonchi  ABDOMEN: Soft, non-tender, non-distended EXTREMITIES:  Mild LLE edema/erythema in setting of wrapping for wound; No acute deformity   Asessement and Plan:.    1. CAD - no recent symptoms suggestive of ischemia. He is not on ASA due to concomitant Eliquis. Ranexa was previously stopped due to fatigue, no accelerating anginal symptoms. His EKG today shows development of RBBB + LAFB with diffuse TWI and QT prolongation. I discussed his case with Dr. Elberta Fortis, familiar with his care from EP. He recommends to discontinue amiodarone given the QT prolongation and follow for now - he felt that in the absence of any cardiac symptoms, would not need to pursue updated ischemic eval, would recommend to continue to manage medically in light of comorbidities, age, renal disease. Also updated Dr. Anne Fu who is in agreement of plan. We will arrange a nurse  visit next available (1-2 weeks) to follow QT interval, with recheck visit in 2-3 months. Patient aware to notify for any new concerns. Continue Toprol, atorvastatin. He sees PCP back in November and anticipates lipids will be checked at that time   2. SSS s/p PPM, remote brief PAF, NSVT, with prolonged QT interval here - see above. Stopping amiodarone due to QT prolongation. Will need to follow for recurrent PAF. Discussed monitoring his HR periodically at home and notifying for any interim concerns. We will check lytes, thyroid, CBC as well today. Also due for EP f/u as well, message sent to EP schedulers to help coordinate. (Dr. Elberta Fortis advised 6 mo f/u at 08/2022 OV.)  3. H/o recurrent VTE - continue Eliquis 2.5mg  BID. Previously discussed dosing with Dr. Anne Fu who agreed with lower dose given h/o intermittent falling, advanced age, renal disease in the past. He is currently seeing [facility] for his lower extremity wound issues.  Check CBC as above today.  4. Valvular heart disease with mild AI, moderate MR, moderate AS, moderate-severe TR - thankfully no worsening symptoms for now, but need to keep an eye on this going forward. Would discuss follow-up echo at next OV, sooner if new symptoms arise.  5. Chronic HFpEF, moderate pulm HTN - has chronic L>RLE edema in context of old paratrooping injury, also now occurring in the context of recent leg wound. Although weight uptrending, he actually appears euvolemic from prior otherwise. BP recheck by me 112/72. Continue Lasix 20mg  BID. Update labs today. Of note, pulmonary HTN was not explicitly mentioned on 08/2022 echo but suspect still present in setting of tricuspid regurgitation. This was previously felt related to underlying pulmonary disease. Continue to follow at each visit.     Disposition: F/u with me in 2-3 months. Also due for EP f/u as well, message sent to EP schedulers to help coordinate. (Dr. Elberta Fortis advised 6 mo f/u at 08/2022  OV.)  Signed, Laurann Montana, PA-C

## 2023-05-22 ENCOUNTER — Ambulatory Visit: Payer: Medicare Other | Attending: Cardiology | Admitting: Physician Assistant

## 2023-05-22 ENCOUNTER — Encounter: Payer: Self-pay | Admitting: Physician Assistant

## 2023-05-22 VITALS — BP 112/72 | HR 65 | Ht 75.0 in | Wt 233.8 lb

## 2023-05-22 DIAGNOSIS — I4729 Other ventricular tachycardia: Secondary | ICD-10-CM | POA: Diagnosis not present

## 2023-05-22 DIAGNOSIS — I2510 Atherosclerotic heart disease of native coronary artery without angina pectoris: Secondary | ICD-10-CM | POA: Diagnosis not present

## 2023-05-22 DIAGNOSIS — I480 Paroxysmal atrial fibrillation: Secondary | ICD-10-CM | POA: Diagnosis not present

## 2023-05-22 DIAGNOSIS — I272 Pulmonary hypertension, unspecified: Secondary | ICD-10-CM | POA: Diagnosis not present

## 2023-05-22 DIAGNOSIS — I82409 Acute embolism and thrombosis of unspecified deep veins of unspecified lower extremity: Secondary | ICD-10-CM | POA: Insufficient documentation

## 2023-05-22 DIAGNOSIS — I77810 Thoracic aortic ectasia: Secondary | ICD-10-CM

## 2023-05-22 DIAGNOSIS — I38 Endocarditis, valve unspecified: Secondary | ICD-10-CM | POA: Insufficient documentation

## 2023-05-22 DIAGNOSIS — Z950 Presence of cardiac pacemaker: Secondary | ICD-10-CM | POA: Insufficient documentation

## 2023-05-22 DIAGNOSIS — R9431 Abnormal electrocardiogram [ECG] [EKG]: Secondary | ICD-10-CM | POA: Diagnosis not present

## 2023-05-22 NOTE — Patient Instructions (Signed)
Medication Instructions:  Your physician has recommended you make the following change in your medication:   STOP Amiodarone  *If you need a refill on your cardiac medications before your next appointment, please call your pharmacy*   Lab Work: TODAY:  CMET, MAG, TSH, & CBC  If you have labs (blood work) drawn today and your tests are completely normal, you will receive your results only by: MyChart Message (if you have MyChart) OR A paper copy in the mail If you have any lab test that is abnormal or we need to change your treatment, we will call you to review the results.   Testing/Procedures: None ordered  Your physician recommends that you schedule a follow-up appointment in: 1 WEEK FOR A NURSE VISIT FOR A EKG  Follow-Up: At [facility], you and your health needs are our priority.  As part of our continuing mission to provide you with exceptional heart care, we have created designated Provider Care Teams.  These Care Teams include your primary Cardiologist (physician) and Advanced Practice Providers (APPs -  Physician Assistants and Nurse Practitioners) who all work together to provide you with the care you need, when you need it.  We recommend signing up for the patient portal called "MyChart".  Sign up information is provided on this After Visit Summary.  MyChart is used to connect with patients for Virtual Visits (Telemedicine).  Patients are able to view lab/test results, encounter notes, upcoming appointments, etc.  Non-urgent messages can be sent to your provider as well.   To learn more about what you can do with MyChart, go to [email or web address].    Your next appointment:   2-3 month(s)  Provider:   Donato Schultz, MD  or Ronie Spies, PA-C         Other Instructions

## 2023-05-23 DIAGNOSIS — B9689 Other specified bacterial agents as the cause of diseases classified elsewhere: Secondary | ICD-10-CM | POA: Diagnosis not present

## 2023-05-23 DIAGNOSIS — S81812A Laceration without foreign body, left lower leg, initial encounter: Secondary | ICD-10-CM | POA: Diagnosis not present

## 2023-05-23 DIAGNOSIS — L03116 Cellulitis of left lower limb: Secondary | ICD-10-CM | POA: Diagnosis not present

## 2023-05-23 DIAGNOSIS — L089 Local infection of the skin and subcutaneous tissue, unspecified: Secondary | ICD-10-CM | POA: Diagnosis not present

## 2023-05-23 LAB — CBC
Hematocrit: 40.7 % (ref 37.5–51.0)
Hemoglobin: 13.4 g/dL (ref 13.0–17.7)
MCH: 31.6 pg (ref 26.6–33.0)
MCHC: 32.9 g/dL (ref 31.5–35.7)
MCV: 96 fL (ref 79–97)
Platelets: 180 10*3/uL (ref 150–450)
RBC: 4.24 x10E6/uL (ref 4.14–5.80)
RDW: 13.9 % (ref 11.6–15.4)
WBC: 8.5 10*3/uL (ref 3.4–10.8)

## 2023-05-23 LAB — COMPREHENSIVE METABOLIC PANEL
ALT: 24 [IU]/L (ref 0–44)
AST: 25 [IU]/L (ref 0–40)
Albumin: 3.8 g/dL (ref 3.7–4.7)
Alkaline Phosphatase: 109 [IU]/L (ref 44–121)
BUN/Creatinine Ratio: 16 (ref 10–24)
BUN: 32 mg/dL — ABNORMAL HIGH (ref 8–27)
Bilirubin Total: 0.5 mg/dL (ref 0.0–1.2)
CO2: 25 mmol/L (ref 20–29)
Calcium: 9.7 mg/dL (ref 8.6–10.2)
Chloride: 104 mmol/L (ref 96–106)
Creatinine, Ser: 1.98 mg/dL — ABNORMAL HIGH (ref 0.76–1.27)
Globulin, Total: 2.7 g/dL (ref 1.5–4.5)
Glucose: 113 mg/dL — ABNORMAL HIGH (ref 70–99)
Potassium: 4.3 mmol/L (ref 3.5–5.2)
Sodium: 143 mmol/L (ref 134–144)
Total Protein: 6.5 g/dL (ref 6.0–8.5)
eGFR: 32 mL/min/{1.73_m2} — ABNORMAL LOW (ref >59)

## 2023-05-23 LAB — TSH: TSH: 1.46 u[IU]/mL (ref 0.450–4.500)

## 2023-05-23 LAB — MAGNESIUM: Magnesium: 2.1 mg/dL (ref 1.6–2.3)

## 2023-05-25 ENCOUNTER — Encounter: Payer: Self-pay | Admitting: Family Medicine

## 2023-05-25 DIAGNOSIS — S81812D Laceration without foreign body, left lower leg, subsequent encounter: Secondary | ICD-10-CM | POA: Diagnosis not present

## 2023-05-26 DIAGNOSIS — N189 Chronic kidney disease, unspecified: Secondary | ICD-10-CM | POA: Diagnosis not present

## 2023-05-26 DIAGNOSIS — I129 Hypertensive chronic kidney disease with stage 1 through stage 4 chronic kidney disease, or unspecified chronic kidney disease: Secondary | ICD-10-CM | POA: Diagnosis not present

## 2023-05-26 DIAGNOSIS — Z7901 Long term (current) use of anticoagulants: Secondary | ICD-10-CM | POA: Diagnosis not present

## 2023-05-26 DIAGNOSIS — I2510 Atherosclerotic heart disease of native coronary artery without angina pectoris: Secondary | ICD-10-CM | POA: Diagnosis not present

## 2023-05-26 DIAGNOSIS — Z950 Presence of cardiac pacemaker: Secondary | ICD-10-CM | POA: Diagnosis not present

## 2023-05-26 DIAGNOSIS — S81812A Laceration without foreign body, left lower leg, initial encounter: Secondary | ICD-10-CM | POA: Diagnosis not present

## 2023-05-26 DIAGNOSIS — S81812D Laceration without foreign body, left lower leg, subsequent encounter: Secondary | ICD-10-CM | POA: Diagnosis not present

## 2023-05-27 ENCOUNTER — Encounter (HOSPITAL_BASED_OUTPATIENT_CLINIC_OR_DEPARTMENT_OTHER): Payer: Medicare Other | Attending: Physician Assistant | Admitting: Internal Medicine

## 2023-05-27 DIAGNOSIS — I4891 Unspecified atrial fibrillation: Secondary | ICD-10-CM | POA: Diagnosis not present

## 2023-05-27 DIAGNOSIS — I350 Nonrheumatic aortic (valve) stenosis: Secondary | ICD-10-CM | POA: Insufficient documentation

## 2023-05-27 DIAGNOSIS — L97828 Non-pressure chronic ulcer of other part of left lower leg with other specified severity: Secondary | ICD-10-CM | POA: Diagnosis not present

## 2023-05-27 DIAGNOSIS — I272 Pulmonary hypertension, unspecified: Secondary | ICD-10-CM | POA: Insufficient documentation

## 2023-05-27 DIAGNOSIS — I2510 Atherosclerotic heart disease of native coronary artery without angina pectoris: Secondary | ICD-10-CM | POA: Diagnosis not present

## 2023-05-27 DIAGNOSIS — N189 Chronic kidney disease, unspecified: Secondary | ICD-10-CM | POA: Diagnosis not present

## 2023-05-27 DIAGNOSIS — I130 Hypertensive heart and chronic kidney disease with heart failure and stage 1 through stage 4 chronic kidney disease, or unspecified chronic kidney disease: Secondary | ICD-10-CM | POA: Insufficient documentation

## 2023-05-27 DIAGNOSIS — I509 Heart failure, unspecified: Secondary | ICD-10-CM | POA: Insufficient documentation

## 2023-05-27 DIAGNOSIS — Z7901 Long term (current) use of anticoagulants: Secondary | ICD-10-CM | POA: Insufficient documentation

## 2023-05-27 DIAGNOSIS — S81802A Unspecified open wound, left lower leg, initial encounter: Secondary | ICD-10-CM | POA: Diagnosis not present

## 2023-05-29 NOTE — Progress Notes (Signed)
MYSHAUN, HOLDT ([id number]) [id number] Page 1 of 4 Visit Report for 05/27/2023 Abuse Risk Screen Details Patient Name: Date of Service: [name] ID H. 05/27/2023 12:30 PM Medical Record Number: [id number] Patient Account Number: [id number] Date of Birth/Sex: Treating RN: 1933/10/02 (89 y.o. Yates Decamp Primary Care Jerol Rufener: Tana Conch Other Clinician: Referring Carma Dwiggins: Treating Joene Gelder/Extender: Jaquelyn Bitter in Treatment: 0 Abuse Risk Screen Items Answer ABUSE RISK SCREEN: Has anyone close to you tried to hurt or harm you recentlyo No Do you feel uncomfortable with anyone in your familyo No Has anyone forced you do things that you didnt want to doo No Electronic Signature(s) Signed: 05/29/2023 4:07:14 PM By: Brenton Grills Entered By: Brenton Grills on 05/27/2023 12:44:38 -------------------------------------------------------------------------------- Activities of Daily Living Details Patient Name: Date of Service: [name] ID H. 05/27/2023 12:30 PM Medical Record Number: [id number] Patient Account Number: [id number] Date of Birth/Sex: Treating RN: October 26, 1933 (89 y.o. Yates Decamp Primary Care Dakarri Kessinger: Tana Conch Other Clinician: Referring Nicholaos Schippers: Treating Barb Shear/Extender: Jaquelyn Bitter in Treatment: 0 Activities of Daily Living Items Answer Activities of Daily Living (Please select one for each item) Drive Automobile Completely Able T Medications ake Completely Able Use T elephone Completely Able Care for Appearance Completely Able Use T oilet Completely Able Bath / Shower Completely Able Dress Self Completely Able Feed Self Completely Able Walk Completely Able Get In / Out Bed Completely Able Housework Completely Able Prepare Meals Completely Able Handle Money Completely Able Shop for Self Completely Able Electronic Signature(s) Signed:  05/29/2023 4:07:14 PM By: Brenton Grills Entered By: Brenton Grills on 05/27/2023 12:45:17 Diniz, Deantae H ([id number]) [id number] Page 2 of 4 -------------------------------------------------------------------------------- Education Screening Details Patient Name: Date of Service: [name] ID H. 05/27/2023 12:30 PM Medical Record Number: [id number] Patient Account Number: [id number] Date of Birth/Sex: Treating RN: [birth year] (89 y.o. Yates Decamp Primary Care Ebbie Sorenson: Tana Conch Other Clinician: Referring Laiyla Slagel: Treating Yu Cragun/Extender: Jaquelyn Bitter in Treatment: 0 Learning Preferences/Education Level/Primary Language Highest Education Level: College or Above Preferred Language: English Cognitive Barrier Language Barrier: No Translator Needed: No Memory Deficit: No Emotional Barrier: No Cultural/Religious Beliefs Affecting Medical Care: No Physical Barrier Impaired Vision: No Impaired Hearing: No Decreased Hand dexterity: No Knowledge/Comprehension Knowledge Level: High Comprehension Level: High Ability to understand written instructions: High Ability to understand verbal instructions: High Motivation Anxiety Level: Calm Cooperation: Cooperative Education Importance: Acknowledges Need Interest in Health Problems: Asks Questions Perception: Coherent Willingness to Engage in Self-Management High Activities: Readiness to Engage in Self-Management High Activities: Electronic Signature(s) Signed: 05/29/2023 4:07:14 PM By: Brenton Grills Entered By: Brenton Grills on 05/27/2023 12:45:54 -------------------------------------------------------------------------------- Fall Risk Assessment Details Patient Name: Date of Service: [name], [name] 05/27/2023 12:30 PM Medical Record Number: [id number] Patient Account Number: [id number] Date of Birth/Sex: Treating RN: [birth year] (89 y.o. Yates Decamp Primary Care Tashira Torre: Tana Conch Other Clinician: Referring Venera Privott: Treating Lorry Anastasi/Extender: Jaquelyn Bitter in Treatment: 0 Fall Risk Assessment Items Have you had 2 or more falls in the last 12 monthso 0 No Have you had any fall that resulted in injury in the last 12 monthso 0 No FALLS RISK SCREEN Strada, Janet H ([id number]) [id number] Page 3 of 4 History of falling - immediate or within 3 months 0 No Secondary diagnosis (Do you have 2 or more medical diagnoseso) 0 No Ambulatory aid None/bed rest/wheelchair/nurse 0 No Crutches/cane/walker 0 No Furniture  0 No Intravenous therapy Access/Saline/Heparin Lock 0 No Gait/Transferring Normal/ bed rest/ wheelchair 0 No Weak (short steps with or without shuffle, stooped but able to lift head while walking, may seek 0 No support from furniture) Impaired (short steps with shuffle, may have difficulty arising from chair, head down, impaired 0 No balance) Mental Status Oriented to own ability 0 No Electronic Signature(s) Signed: 05/29/2023 4:07:14 PM By: Brenton Grills Entered By: Brenton Grills on 05/27/2023 12:46:01 -------------------------------------------------------------------------------- Foot Assessment Details Patient Name: Date of Service: [name], [name] 05/27/2023 12:30 PM Medical Record Number: [id number] Patient Account Number: [id number] Date of Birth/Sex: Treating RN: 07-21-1934 (89 y.o. Yates Decamp Primary Care Laasia Arcos: Tana Conch Other Clinician: Referring Malarie Tappen: Treating Eain Mullendore/Extender: Jaquelyn Bitter in Treatment: 0 Foot Assessment Items Site Locations + = Sensation present, - = Sensation absent, C = Callus, U = Ulcer R = Redness, W = Warmth, M = Maceration, PU = Pre-ulcerative lesion F = Fissure, S = Swelling, D = Dryness Assessment Right: Left: Other Deformity: No No Prior Foot Ulcer: No  No Prior Amputation: No No Charcot Joint: No No Ambulatory Status: Ambulatory Without Help GaitAyon Gipple, Kento H ([id number]) [id number] Page 4 of 4 Electronic Signature(s) Signed: 05/29/2023 4:07:14 PM By: Brenton Grills Entered By: Brenton Grills on 05/27/2023 12:53:12 -------------------------------------------------------------------------------- Nutrition Risk Screening Details Patient Name: Date of Service: [name] ID H. 05/27/2023 12:30 PM Medical Record Number: [id number] Patient Account Number: [id number] Date of Birth/Sex: Treating RN: Jan 09, 1934 (89 y.o. Yates Decamp Primary Care Chadwick Reiswig: Tana Conch Other Clinician: Referring Xin Klawitter: Treating Idabelle Mcpeters/Extender: Jaquelyn Bitter in Treatment: 0 Height (in): 75 Weight (lbs): 238 Body Mass Index (BMI): 29.7 Nutrition Risk Screening Items Score Screening NUTRITION RISK SCREEN: I have an illness or condition that made me change the kind and/or amount of food I eat 0 No I eat fewer than two meals per day 0 No I eat few fruits and vegetables, or milk products 0 No I have three or more drinks of beer, liquor or wine almost every day 0 No I have tooth or mouth problems that make it hard for me to eat 0 No I don't always have enough money to buy the food I need 0 No I eat alone most of the time 0 No I take three or more different prescribed or over-the-counter drugs a day 0 No Without wanting to, I have lost or gained 10 pounds in the last six months 0 No I am not always physically able to shop, cook and/or feed myself 0 No Nutrition Protocols Good Risk Protocol Moderate Risk Protocol High Risk Proctocol Risk Level: Good Risk Score: 0 Electronic Signature(s) Signed: 05/29/2023 4:07:14 PM By: Brenton Grills Entered By: Brenton Grills on 05/27/2023 12:46:08

## 2023-05-29 NOTE — Progress Notes (Signed)
[name] ([id number]) [id number] Page 1 of 7 Visit Report for 05/27/2023 Chief Complaint Document Details Patient Name: Date of Service: [name] ID H. 05/27/2023 12:30 PM Medical Record Number: [id number] Patient Account Number: [id number] Date of Birth/Sex: Treating RN: May 09, 1934 (89 y.o. M) Primary Care Provider: Tana Conch Other Clinician: Referring Provider: Treating Provider/Extender: Jaquelyn Bitter in Treatment: 0 Information Obtained from: Patient Chief Complaint 05/27/2023; patient comes in today with a traumatic wound on the left anterior lower leg Electronic Signature(s) Signed: 05/27/2023 4:41:28 PM By: Baltazar Najjar MD Entered By: Baltazar Najjar on 05/27/2023 13:49:49 -------------------------------------------------------------------------------- HPI Details Patient Name: Date of Service: [name], [name] 05/27/2023 12:30 PM Medical Record Number: [id number] Patient Account Number: [id number] Date of Birth/Sex: Treating RN: 30-Jan-1934 (89 y.o. M) Primary Care Provider: Tana Conch Other Clinician: Referring Provider: Treating Provider/Extender: Jaquelyn Bitter in Treatment: 0 History of Present Illness HPI Description: ADMISSION 05/27/2023; The patient is a 89 year old man who traumatized his left leg on a bed frame in [place] on . The wound was initially repaired in the ER with 17 sutures. He has been seen twice in urgent care on 10/8. He was given mupirocin and doxycycline. He was again seen in urgent care on 10/10 felt to have cellulitis and had Cipro added to the doxycycline which she is still taking. The patient is on Eliquis for history of sick sinus syndrome and atrial fibrillation. The patient is not complaining of a lot of pain. He is able to show me pictures of progressive skin necrosis just lateral to the original wound. Past medical history includes  sick sinus syndrome, coronary artery disease, aortic stenosis, chronic kidney disease. He is on Eliquis ABI in our clinic at 1.04 on the right and 1.06 on the left Electronic Signature(s) Signed: 05/27/2023 4:41:28 PM By: Baltazar Najjar MD Entered By: Baltazar Najjar on 05/27/2023 13:54:30 Physical Exam Details -------------------------------------------------------------------------------- [name] ([id number]) [id number] Page 2 of 7 Patient Name: Date of Service: [name] ID H. 05/27/2023 12:30 PM Medical Record Number: [id number] Patient Account Number: [id number] Date of Birth/Sex: Treating RN: 07/15/1934 (89 y.o. M) Primary Care Provider: Tana Conch Other Clinician: Referring Provider: Treating Provider/Extender: Jaquelyn Bitter in Treatment: 0 Constitutional Sitting or standing Blood Pressure is within target range for patient.. Pulse regular and within target range for patient.[name] Respirations regular, non-labored and within target range.. Temperature is normal and within the target range for the patient.[name] Appears in no distress. Cardiovascular Pedal pulses are easily palpable on the left. Notes Wound exam; on the left anterior mid tibial area of the long linear laceration with still sutures intact. Just lateral to this is a large area of skin necrosis. There is considerable swelling around the wound. Somebody is marked this area presumably to follow possible cellulitis. The eschared skin is separating minimally in selected areas. There is no drainage. No warmth around the wound but there is some tenderness. Electronic Signature(s) Signed: 05/27/2023 4:41:28 PM By: Baltazar Najjar MD Entered By: Baltazar Najjar on 05/27/2023 13:56:25 -------------------------------------------------------------------------------- Physician Orders Details Patient Name: Date of Service: [name], [name] 05/27/2023 12:30  PM Medical Record Number: [id number] Patient Account Number: [id number] Date of Birth/Sex: Treating RN: 03-22-1934 (89 y.o. Yates Decamp Primary Care Provider: Tana Conch Other Clinician: Referring Provider: Treating Provider/Extender: Jaquelyn Bitter in Treatment: 0 The following information was scribed by: Brenton Grills The information was scribed  [name] ([id number]) [id number] Page 1 of 7 Visit Report for 05/27/2023 Chief Complaint Document Details Patient Name: Date of Service: [name] ID H. 05/27/2023 12:30 PM Medical Record Number: [id number] Patient Account Number: [id number] Date of Birth/Sex: Treating RN: May 09, 1934 (89 y.o. M) Primary Care Provider: Tana Conch Other Clinician: Referring Provider: Treating Provider/Extender: Jaquelyn Bitter in Treatment: 0 Information Obtained from: Patient Chief Complaint 05/27/2023; patient comes in today with a traumatic wound on the left anterior lower leg Electronic Signature(s) Signed: 05/27/2023 4:41:28 PM By: Baltazar Najjar MD Entered By: Baltazar Najjar on 05/27/2023 13:49:49 -------------------------------------------------------------------------------- HPI Details Patient Name: Date of Service: [name], [name] 05/27/2023 12:30 PM Medical Record Number: [id number] Patient Account Number: [id number] Date of Birth/Sex: Treating RN: 30-Jan-1934 (89 y.o. M) Primary Care Provider: Tana Conch Other Clinician: Referring Provider: Treating Provider/Extender: Jaquelyn Bitter in Treatment: 0 History of Present Illness HPI Description: ADMISSION 05/27/2023; The patient is a 89 year old man who traumatized his left leg on a bed frame in [place] on . The wound was initially repaired in the ER with 17 sutures. He has been seen twice in urgent care on 10/8. He was given mupirocin and doxycycline. He was again seen in urgent care on 10/10 felt to have cellulitis and had Cipro added to the doxycycline which she is still taking. The patient is on Eliquis for history of sick sinus syndrome and atrial fibrillation. The patient is not complaining of a lot of pain. He is able to show me pictures of progressive skin necrosis just lateral to the original wound. Past medical history includes  sick sinus syndrome, coronary artery disease, aortic stenosis, chronic kidney disease. He is on Eliquis ABI in our clinic at 1.04 on the right and 1.06 on the left Electronic Signature(s) Signed: 05/27/2023 4:41:28 PM By: Baltazar Najjar MD Entered By: Baltazar Najjar on 05/27/2023 13:54:30 Physical Exam Details -------------------------------------------------------------------------------- [name] ([id number]) [id number] Page 2 of 7 Patient Name: Date of Service: [name] ID H. 05/27/2023 12:30 PM Medical Record Number: [id number] Patient Account Number: [id number] Date of Birth/Sex: Treating RN: 07/15/1934 (89 y.o. M) Primary Care Provider: Tana Conch Other Clinician: Referring Provider: Treating Provider/Extender: Jaquelyn Bitter in Treatment: 0 Constitutional Sitting or standing Blood Pressure is within target range for patient.. Pulse regular and within target range for patient.[name] Respirations regular, non-labored and within target range.. Temperature is normal and within the target range for the patient.[name] Appears in no distress. Cardiovascular Pedal pulses are easily palpable on the left. Notes Wound exam; on the left anterior mid tibial area of the long linear laceration with still sutures intact. Just lateral to this is a large area of skin necrosis. There is considerable swelling around the wound. Somebody is marked this area presumably to follow possible cellulitis. The eschared skin is separating minimally in selected areas. There is no drainage. No warmth around the wound but there is some tenderness. Electronic Signature(s) Signed: 05/27/2023 4:41:28 PM By: Baltazar Najjar MD Entered By: Baltazar Najjar on 05/27/2023 13:56:25 -------------------------------------------------------------------------------- Physician Orders Details Patient Name: Date of Service: [name], [name] 05/27/2023 12:30  PM Medical Record Number: [id number] Patient Account Number: [id number] Date of Birth/Sex: Treating RN: 03-22-1934 (89 y.o. Yates Decamp Primary Care Provider: Tana Conch Other Clinician: Referring Provider: Treating Provider/Extender: Jaquelyn Bitter in Treatment: 0 The following information was scribed by: Brenton Grills The information was scribed  for: Baltazar Najjar Verbal / Phone Orders: No Diagnosis Coding Follow-up Appointments ppointment in 1 week. - Dr. Lady Gary - Please make appointment - schedule extra time for potential hematoma evacuation. Return A Anesthetic (In clinic) Topical Lidocaine 4% applied to wound bed Bathing/ Shower/ Hygiene May shower with protection but do not get wound dressing(s) wet. Protect dressing(s) with water repellant cover (for example, large plastic bag) or a cast cover and may then take shower. Edema Control - Lymphedema / SCD / Other Left Lower Extremity Elevate legs to the level of the heart or above for 30 minutes daily and/or when sitting for 3-4 times a day throughout the day. A void standing for long periods of time. Exercise regularly If compression wraps slide down please call wound center and speak with a nurse. Additional Orders / Instructions Other: - Call us for any fever, chills or copious drainage. Wound Treatment Wound #1 - Lower Leg Wound Laterality: Left, Lateral Prim Dressing: Maxorb Extra Ag+ Alginate Dressing, 4x4.75 (in/in) ary Discharge Instructions: Apply to wound bed as instructed Secondary Dressing: ABD Pad, 8x10 Discharge Instructions: Apply over primary dressing as directed. Compression Wrap: Urgo K2, (equivalent to a 4 layer) two layer compression system, regular Discharge Instructions: Apply Urgo K2 as directed (alternative to 4 layer compression). LEVERETT, CAMPLIN ([id number]) [id number] Page 3 of 7 Electronic Signature(s) Signed: 05/27/2023 4:41:28 PM  By: Baltazar Najjar MD Signed: 05/29/2023 4:07:14 PM By: Brenton Grills Entered By: Brenton Grills on 05/27/2023 13:36:23 -------------------------------------------------------------------------------- Problem List Details Patient Name: Date of Service: [name], [name] 05/27/2023 12:30 PM Medical Record Number: [id number] Patient Account Number: [id number] Date of Birth/Sex: Treating RN: 09-01-1933 (89 y.o. M) Primary Care Provider: Tana Conch Other Clinician: Referring Provider: Treating Provider/Extender: Jaquelyn Bitter in Treatment: 0 Active Problems ICD-10 Encounter Code Description Active Date MDM Diagnosis S80.12XD Contusion of left lower leg, subsequent encounter 05/27/2023 No Yes L97.828 Non-pressure chronic ulcer of other part of left lower leg with other specified 05/27/2023 No Yes severity L03.116 Cellulitis of left lower limb 05/27/2023 No Yes T45.515S Adverse effect of anticoagulants, sequela 05/27/2023 No Yes Inactive Problems Resolved Problems Electronic Signature(s) Signed: 05/27/2023 4:41:28 PM By: Baltazar Najjar MD Entered By: Baltazar Najjar on 05/27/2023 13:49:13 -------------------------------------------------------------------------------- Progress Note Details Patient Name: Date of Service: [name], [name] 05/27/2023 12:30 PM Medical Record Number: [id number] Patient Account Number: [id number] Date of Birth/Sex: Treating RN: July 10, 1934 (89 y.o. M) Primary Care Provider: Tana Conch Other Clinician: Referring Provider: Treating Provider/Extender: Jaquelyn Bitter in Treatment: 0 Subjective Trudel, English H ([id number]) [id number] Page 4 of 7 Chief Complaint Information obtained from Patient 05/27/2023; patient comes in today with a traumatic wound on the left anterior lower leg History of Present Illness (HPI) ADMISSION 05/27/2023; The patient is a 89 year old  man who traumatized his left leg on a bed frame in [place] on . The wound was initially repaired in the ER with 17 sutures. He has been seen twice in urgent care on 10/8. He was given mupirocin and doxycycline. He was again seen in urgent care on 10/10 felt to have cellulitis and had Cipro added to the doxycycline which she is still taking. The patient is on Eliquis for history of sick sinus syndrome and atrial fibrillation. The patient is not complaining of a lot of pain. He is able to show me pictures of progressive skin necrosis just lateral to the original wound. Past medical history includes sick sinus syndrome, coronary  Lower Leg Wound Laterality: Left, Lateral Prim Dressing: Maxorb Extra Ag+ Alginate Dressing, 4x4.75 (in/in) ary Discharge Instructions: Apply to wound bed as instructed Secondary Dressing: ABD Pad, 8x10 Discharge Instructions: Apply over primary dressing as directed. Com pression Wrap: Urgo K2, (equivalent to a 4 layer) two layer compression system, regular Discharge Instructions: Apply Urgo K2 as directed (alternative to 4 layer compression). 1. Trauma the left leg in a patient on underlying anticoagulants. He has a laceration and fortunately a significant hematoma. 2. Whether or not he has coexistent cellulitis right now I am uncertain but I have advised him to continue his antibiotics for now 3. Eventually the black eschar will separate and unleash the contents contents of the hematoma. This will need to be evacuated. 4. I am going to put him in compression today and the compression perhaps could last another 2 weeks or so this may help the swelling #5 unfortunately this is going to take a long time to heal. Wounds like this often do well with a wound VAC once they have been evacuated and that we are certain that there is no underlying infection 6. Have spoken to Dr. Lady Gary in the clinic who  is a Development worker, international aid. She will follow this. I do not think there is anything urgently today that needs taken care of Electronic Signature(s) Signed: 05/27/2023 4:41:28 PM By: Baltazar Najjar MD Entered By: Baltazar Najjar on 05/27/2023 13:59:23 -------------------------------------------------------------------------------- HxROS Details Patient Name: Date of Service: [name], [name] 05/27/2023 12:30 PM Medical Record Number: [id number] Patient Account Number: [id number] Date of Birth/Sex: Treating RN: [birth year] (89 y.o. Yates Decamp Primary Care Provider: Tana Conch Other Clinician: Referring Provider: Treating Provider/Extender: Jaquelyn Bitter in Treatment: 0 Information Obtained From Chart Integumentary (Skin) Complaints and Symptoms: Positive for: Wounds - left lower leg Respiratory Medical History: Past Medical History Notes: Pulmonary Hypertension SLEIGHT, Miraj H ([id number]) [id number] Page 6 of 7 Cardiovascular Medical History: Positive for: Arrhythmia - a fib; Congestive Heart Failure; Coronary Artery Disease; Hypertension; Myocardial Infarction Genitourinary Medical History: Past Medical History Notes: BPH, CKD Immunizations Pneumococcal Vaccine: Received Pneumococcal Vaccination: No Implantable Devices No devices added Hospitalization / Surgery History Type of Hospitalization/Surgery cardiac cath cardiac angiogram angioplasty joint replacement knee arthroscopy tonsillectomy lap chole cataract surgery pilonidal cystectomy Family and Social History Cancer: Yes - Mother; Heart Disease: Yes - Father; Never smoker; Alcohol Use: Never; Drug Use: No History; Financial Concerns: No; Food, Clothing or Shelter Needs: No; Support System Lacking: No; Transportation Concerns: No Electronic Signature(s) Signed: 05/27/2023 4:41:28 PM By: Baltazar Najjar MD Signed: 05/29/2023 4:07:14 PM By: Brenton Grills Entered By: Brenton Grills on 05/27/2023 09:45:45 -------------------------------------------------------------------------------- SuperBill Details Patient Name: Date of Service: [name], [name] 05/27/2023 Medical Record Number: [id number] Patient Account Number: [id number] Date of Birth/Sex: Treating RN: Jan 22, 1934 (89 y.o. Yates Decamp Primary Care Provider: Tana Conch Other Clinician: Referring Provider: Treating Provider/Extender: Jaquelyn Bitter in Treatment: 0 Diagnosis Coding ICD-10 Codes Code Description [phone] Contusion of left lower leg, subsequent encounter L97.828 Non-pressure chronic ulcer of other part of left lower leg with other specified severity L03.116 Cellulitis of left lower limb T45.515S Adverse effect of anticoagulants, sequela Facility Procedures Physician Procedures : CPT4 Code Description Modifier [id number] WC PHYS LEVEL 3 NEW PT ICD-10 Diagnosis Description S80.12XD Contusion of left lower leg, subsequent encounter L97.828 Non-pressure chronic ulcer of other part of left lower leg with other specified severity  L03.116 Cellulitis of left lower limb  [name] ([id number]) [id number] Page 1 of 7 Visit Report for 05/27/2023 Chief Complaint Document Details Patient Name: Date of Service: [name] ID H. 05/27/2023 12:30 PM Medical Record Number: [id number] Patient Account Number: [id number] Date of Birth/Sex: Treating RN: May 09, 1934 (89 y.o. M) Primary Care Provider: Tana Conch Other Clinician: Referring Provider: Treating Provider/Extender: Jaquelyn Bitter in Treatment: 0 Information Obtained from: Patient Chief Complaint 05/27/2023; patient comes in today with a traumatic wound on the left anterior lower leg Electronic Signature(s) Signed: 05/27/2023 4:41:28 PM By: Baltazar Najjar MD Entered By: Baltazar Najjar on 05/27/2023 13:49:49 -------------------------------------------------------------------------------- HPI Details Patient Name: Date of Service: [name], [name] 05/27/2023 12:30 PM Medical Record Number: [id number] Patient Account Number: [id number] Date of Birth/Sex: Treating RN: 30-Jan-1934 (89 y.o. M) Primary Care Provider: Tana Conch Other Clinician: Referring Provider: Treating Provider/Extender: Jaquelyn Bitter in Treatment: 0 History of Present Illness HPI Description: ADMISSION 05/27/2023; The patient is a 89 year old man who traumatized his left leg on a bed frame in [place] on . The wound was initially repaired in the ER with 17 sutures. He has been seen twice in urgent care on 10/8. He was given mupirocin and doxycycline. He was again seen in urgent care on 10/10 felt to have cellulitis and had Cipro added to the doxycycline which she is still taking. The patient is on Eliquis for history of sick sinus syndrome and atrial fibrillation. The patient is not complaining of a lot of pain. He is able to show me pictures of progressive skin necrosis just lateral to the original wound. Past medical history includes  sick sinus syndrome, coronary artery disease, aortic stenosis, chronic kidney disease. He is on Eliquis ABI in our clinic at 1.04 on the right and 1.06 on the left Electronic Signature(s) Signed: 05/27/2023 4:41:28 PM By: Baltazar Najjar MD Entered By: Baltazar Najjar on 05/27/2023 13:54:30 Physical Exam Details -------------------------------------------------------------------------------- [name] ([id number]) [id number] Page 2 of 7 Patient Name: Date of Service: [name] ID H. 05/27/2023 12:30 PM Medical Record Number: [id number] Patient Account Number: [id number] Date of Birth/Sex: Treating RN: 07/15/1934 (89 y.o. M) Primary Care Provider: Tana Conch Other Clinician: Referring Provider: Treating Provider/Extender: Jaquelyn Bitter in Treatment: 0 Constitutional Sitting or standing Blood Pressure is within target range for patient.. Pulse regular and within target range for patient.[name] Respirations regular, non-labored and within target range.. Temperature is normal and within the target range for the patient.[name] Appears in no distress. Cardiovascular Pedal pulses are easily palpable on the left. Notes Wound exam; on the left anterior mid tibial area of the long linear laceration with still sutures intact. Just lateral to this is a large area of skin necrosis. There is considerable swelling around the wound. Somebody is marked this area presumably to follow possible cellulitis. The eschared skin is separating minimally in selected areas. There is no drainage. No warmth around the wound but there is some tenderness. Electronic Signature(s) Signed: 05/27/2023 4:41:28 PM By: Baltazar Najjar MD Entered By: Baltazar Najjar on 05/27/2023 13:56:25 -------------------------------------------------------------------------------- Physician Orders Details Patient Name: Date of Service: [name], [name] 05/27/2023 12:30  PM Medical Record Number: [id number] Patient Account Number: [id number] Date of Birth/Sex: Treating RN: 03-22-1934 (89 y.o. Yates Decamp Primary Care Provider: Tana Conch Other Clinician: Referring Provider: Treating Provider/Extender: Jaquelyn Bitter in Treatment: 0 The following information was scribed by: Brenton Grills The information was scribed

## 2023-05-29 NOTE — Progress Notes (Signed)
Coronary Artery Disease, Weeks Of Treatment:  0 History: Hypertension, Myocardial Infarction Clustered Wound: No Photos Wound Measurements Length: (cm) 14.5 Width: (cm) 4.9 Depth: (cm) 0.2 Area: (cm) 55.803 Volume: (cm) 11.161 % Reduction in Area: % Reduction in Volume: Tunneling: No Undermining: No Wound Description Classification: Full Thickness Without Exposed Suppor Wound Margin: Distinct, outline attached Exudate Amount: Medium Exudate Type: Serosanguineous Exudate Color: red, brown t Structures Foul Odor After Cleansing: No Slough/Fibrino Yes Wound Bed Granulation Amount: Medium (34-66%) Exposed Structure Granulation Quality: Red, Pink Fat Layer (Subcutaneous Tissue) Exposed: Yes [name], [name] ([id number]) [id number] Page 8 of 8 Necrotic Amount: Medium (34-66%) Necrotic Quality: Eschar Periwound Skin Texture Texture Color No Abnormalities Noted: No No Abnormalities Noted: Yes Scarring: Yes Moisture No Abnormalities Noted: No Dry / Scaly: No Maceration: No Treatment Notes Wound #1 (Lower Leg) Wound Laterality: Left, Lateral Cleanser Peri-Wound Care Topical Primary Dressing Maxorb Extra Ag+ Alginate Dressing, 4x4.75 (in/in) Discharge Instruction: Apply to wound bed as instructed Secondary Dressing ABD Pad, 8x10 Discharge Instruction: Apply over primary dressing as directed. Secured With Compression Wrap Urgo K2, (equivalent to a 4 layer) two layer compression system, regular Discharge Instruction: Apply Urgo K2 as directed (alternative to 4 layer compression). Compression Stockings Add-Ons Electronic Signature(s) Signed: 05/27/2023 5:13:31 PM By: Shawn Stall RN, BSN Signed: 05/29/2023 4:07:14 PM By: Brenton Grills Entered By: Shawn Stall on 05/27/2023 13:07:24 -------------------------------------------------------------------------------- Vitals Details Patient Name: Date of Service: [name], Richard V ID H. 05/27/2023 12:30 PM Medical Record Number:  [id number] Patient Account Number: [id number] Date of Birth/Sex: Treating RN: 30-Dec-1933 (89 y.o. [name] Primary Care Rylah Fukuda: Tana Conch Other Clinician: Referring Mabry Tift: Treating Rigo Letts/Extender: Jaquelyn Bitter in Treatment: 0 Vital Signs Time Taken: 12:30 Temperature (F): 98.2 Height (in): 75 Pulse (bpm): 65 Weight (lbs): 238 Respiratory Rate (breaths/min): 18 Body Mass Index (BMI): 29.7 Blood Pressure (mmHg): 113/59 Reference Range: 80 - 120 mg / dl Electronic Signature(s) Signed: 05/29/2023 4:07:14 PM By: Brenton Grills Entered By: Brenton Grills on 05/27/2023 12:43:18  multiple wounds X- 1 5 Simple Wound Cleansing - one wound []  - 0 Complex Wound Cleansing - multiple wounds INTERVENTIONS - Wound Dressings []  - 0 Small Wound Dressing one or multiple wounds [name], [name] ([id number]) [id number] Page 3 of 8 []  - 0 Medium Wound Dressing one or multiple wounds X- 1 20 Large Wound Dressing one or multiple wounds []  - 0 Application of Medications - injection INTERVENTIONS - Miscellaneous []  - 0 External ear exam []  - 0 Specimen Collection (cultures, biopsies, blood, body fluids, etc.) []  - 0 Specimen(s) / Culture(s) sent or taken to Lab for analysis []  - 0 Patient Transfer (multiple staff / Nurse, adult / Similar devices) []  - 0 Simple Staple / Suture removal (25 or less) []  - 0 Complex Staple / Suture removal (26 or more) []  - 0 Hypo / Hyperglycemic Management (close monitor of Blood Glucose) X- 1 15 Ankle / Brachial Index (ABI) - do not check if billed separately Has the patient been seen at the hospital within the last three years: Yes Total Score: 150 Level Of Care: New/Established - Level 4 Electronic Signature(s) Signed: 05/29/2023 4:07:14 PM By: Brenton Grills Entered By: Brenton Grills on 05/27/2023 13:47:38 -------------------------------------------------------------------------------- Encounter Discharge Information Details Patient Name: Date of Service: [name], Richard V ID H. 05/27/2023 12:30 PM Medical Record Number: [id number] Patient Account Number: [id number] Date of Birth/Sex: Treating RN: 30-Aug-1933 (89 y.o. [name] Primary  Care Benny Henrie: Tana Conch Other Clinician: Referring Shadow Schedler: Treating Anubis Fundora/Extender: Jaquelyn Bitter in Treatment: 0 Encounter Discharge Information Items Discharge Condition: Stable Ambulatory Status: Ambulatory Discharge Destination: Home Transportation: Private Auto Accompanied By: spouse Schedule Follow-up Appointment: Yes Clinical Summary of Care: Patient Declined Electronic Signature(s) Signed: 05/29/2023 4:07:14 PM By: Brenton Grills Entered By: Brenton Grills on 05/27/2023 13:52:03 -------------------------------------------------------------------------------- Lower Extremity Assessment Details Patient Name: Date of Service: [name], Richard V ID H. 05/27/2023 12:30 PM Medical Record Number: [id number] Patient Account Number: [id number] Date of Birth/Sex: Treating RN: 20-Sep-1933 (89 y.o. [name] Primary Care Kayon Dozier: Tana Conch Other Clinician: Referring Stephaun Million: Treating Wave Calzada/Extender: Jaquelyn Bitter in Treatment: 0 [name], [name] ([id number]) [id number] Page 4 of 8 Edema Assessment Assessed: [Left: No] [Right: No] [Left: Edema] [Right: :] Calf Left: Right: Point of Measurement: From Medial Instep 45.2 cm Ankle Left: Right: Point of Measurement: From Medial Instep 29.1 cm Vascular Assessment Pulses: Dorsalis Pedis Palpable: [Left:Yes] Extremity colors, hair growth, and conditions: Hair Growth on Extremity: [Left:No] Temperature of Extremity: [Left:Warm] Capillary Refill: [Left:< 3 seconds] Dependent Rubor: [Left:No] Blanched when Elevated: [Left:No] Lipodermatosclerosis: [Left:No] Blood Pressure: Brachial: [Left:113] [Right:113] Ankle: [Left:Dorsalis Pedis: 120 1.06] [Right:Dorsalis Pedis: 118 1.04] Toe Nail Assessment Left: Right: Thick: Yes Discolored: Yes Deformed: Yes Improper Length and Hygiene: Yes Electronic Signature(s) Signed: 05/29/2023  4:07:14 PM By: Brenton Grills Entered By: Brenton Grills on 05/27/2023 13:01:07 -------------------------------------------------------------------------------- Multi Wound Chart Details Patient Name: Date of Service: [name], Richard V ID H. 05/27/2023 12:30 PM Medical Record Number: [id number] Patient Account Number: [id number] Date of Birth/Sex: Treating RN: [birth year] (89 y.o. M) Primary Care Creg Gilmer: Tana Conch Other Clinician: Referring Azarias Chiou: Treating Makeba Delcastillo/Extender: Jaquelyn Bitter in Treatment: 0 Vital Signs Height(in): 75 Pulse(bpm): 65 Weight(lbs): 238 Blood Pressure(mmHg): 113/59 Body Mass Index(BMI): 29.7 Temperature(F): 98.2 Respiratory Rate(breaths/min): 18 [1:Photos:] [N/A:N/A] Left, Lateral Lower Leg N/A N/A Wound Location: Trauma N/A N/A Wounding Event: Trauma, Other N/A N/A Primary Etiology: Arrhythmia, Congestive Heart Failure, N/A N/A Comorbid History: Coronary Artery Disease, Hypertension, Myocardial  multiple wounds X- 1 5 Simple Wound Cleansing - one wound []  - 0 Complex Wound Cleansing - multiple wounds INTERVENTIONS - Wound Dressings []  - 0 Small Wound Dressing one or multiple wounds [name], [name] ([id number]) [id number] Page 3 of 8 []  - 0 Medium Wound Dressing one or multiple wounds X- 1 20 Large Wound Dressing one or multiple wounds []  - 0 Application of Medications - injection INTERVENTIONS - Miscellaneous []  - 0 External ear exam []  - 0 Specimen Collection (cultures, biopsies, blood, body fluids, etc.) []  - 0 Specimen(s) / Culture(s) sent or taken to Lab for analysis []  - 0 Patient Transfer (multiple staff / Nurse, adult / Similar devices) []  - 0 Simple Staple / Suture removal (25 or less) []  - 0 Complex Staple / Suture removal (26 or more) []  - 0 Hypo / Hyperglycemic Management (close monitor of Blood Glucose) X- 1 15 Ankle / Brachial Index (ABI) - do not check if billed separately Has the patient been seen at the hospital within the last three years: Yes Total Score: 150 Level Of Care: New/Established - Level 4 Electronic Signature(s) Signed: 05/29/2023 4:07:14 PM By: Brenton Grills Entered By: Brenton Grills on 05/27/2023 13:47:38 -------------------------------------------------------------------------------- Encounter Discharge Information Details Patient Name: Date of Service: [name], Richard V ID H. 05/27/2023 12:30 PM Medical Record Number: [id number] Patient Account Number: [id number] Date of Birth/Sex: Treating RN: 30-Aug-1933 (89 y.o. [name] Primary  Care Benny Henrie: Tana Conch Other Clinician: Referring Shadow Schedler: Treating Anubis Fundora/Extender: Jaquelyn Bitter in Treatment: 0 Encounter Discharge Information Items Discharge Condition: Stable Ambulatory Status: Ambulatory Discharge Destination: Home Transportation: Private Auto Accompanied By: spouse Schedule Follow-up Appointment: Yes Clinical Summary of Care: Patient Declined Electronic Signature(s) Signed: 05/29/2023 4:07:14 PM By: Brenton Grills Entered By: Brenton Grills on 05/27/2023 13:52:03 -------------------------------------------------------------------------------- Lower Extremity Assessment Details Patient Name: Date of Service: [name], Richard V ID H. 05/27/2023 12:30 PM Medical Record Number: [id number] Patient Account Number: [id number] Date of Birth/Sex: Treating RN: 20-Sep-1933 (89 y.o. [name] Primary Care Kayon Dozier: Tana Conch Other Clinician: Referring Stephaun Million: Treating Wave Calzada/Extender: Jaquelyn Bitter in Treatment: 0 [name], [name] ([id number]) [id number] Page 4 of 8 Edema Assessment Assessed: [Left: No] [Right: No] [Left: Edema] [Right: :] Calf Left: Right: Point of Measurement: From Medial Instep 45.2 cm Ankle Left: Right: Point of Measurement: From Medial Instep 29.1 cm Vascular Assessment Pulses: Dorsalis Pedis Palpable: [Left:Yes] Extremity colors, hair growth, and conditions: Hair Growth on Extremity: [Left:No] Temperature of Extremity: [Left:Warm] Capillary Refill: [Left:< 3 seconds] Dependent Rubor: [Left:No] Blanched when Elevated: [Left:No] Lipodermatosclerosis: [Left:No] Blood Pressure: Brachial: [Left:113] [Right:113] Ankle: [Left:Dorsalis Pedis: 120 1.06] [Right:Dorsalis Pedis: 118 1.04] Toe Nail Assessment Left: Right: Thick: Yes Discolored: Yes Deformed: Yes Improper Length and Hygiene: Yes Electronic Signature(s) Signed: 05/29/2023  4:07:14 PM By: Brenton Grills Entered By: Brenton Grills on 05/27/2023 13:01:07 -------------------------------------------------------------------------------- Multi Wound Chart Details Patient Name: Date of Service: [name], Richard V ID H. 05/27/2023 12:30 PM Medical Record Number: [id number] Patient Account Number: [id number] Date of Birth/Sex: Treating RN: [birth year] (89 y.o. M) Primary Care Creg Gilmer: Tana Conch Other Clinician: Referring Azarias Chiou: Treating Makeba Delcastillo/Extender: Jaquelyn Bitter in Treatment: 0 Vital Signs Height(in): 75 Pulse(bpm): 65 Weight(lbs): 238 Blood Pressure(mmHg): 113/59 Body Mass Index(BMI): 29.7 Temperature(F): 98.2 Respiratory Rate(breaths/min): 18 [1:Photos:] [N/A:N/A] Left, Lateral Lower Leg N/A N/A Wound Location: Trauma N/A N/A Wounding Event: Trauma, Other N/A N/A Primary Etiology: Arrhythmia, Congestive Heart Failure, N/A N/A Comorbid History: Coronary Artery Disease, Hypertension, Myocardial  multiple wounds X- 1 5 Simple Wound Cleansing - one wound []  - 0 Complex Wound Cleansing - multiple wounds INTERVENTIONS - Wound Dressings []  - 0 Small Wound Dressing one or multiple wounds [name], [name] ([id number]) [id number] Page 3 of 8 []  - 0 Medium Wound Dressing one or multiple wounds X- 1 20 Large Wound Dressing one or multiple wounds []  - 0 Application of Medications - injection INTERVENTIONS - Miscellaneous []  - 0 External ear exam []  - 0 Specimen Collection (cultures, biopsies, blood, body fluids, etc.) []  - 0 Specimen(s) / Culture(s) sent or taken to Lab for analysis []  - 0 Patient Transfer (multiple staff / Nurse, adult / Similar devices) []  - 0 Simple Staple / Suture removal (25 or less) []  - 0 Complex Staple / Suture removal (26 or more) []  - 0 Hypo / Hyperglycemic Management (close monitor of Blood Glucose) X- 1 15 Ankle / Brachial Index (ABI) - do not check if billed separately Has the patient been seen at the hospital within the last three years: Yes Total Score: 150 Level Of Care: New/Established - Level 4 Electronic Signature(s) Signed: 05/29/2023 4:07:14 PM By: Brenton Grills Entered By: Brenton Grills on 05/27/2023 13:47:38 -------------------------------------------------------------------------------- Encounter Discharge Information Details Patient Name: Date of Service: [name], Richard V ID H. 05/27/2023 12:30 PM Medical Record Number: [id number] Patient Account Number: [id number] Date of Birth/Sex: Treating RN: 30-Aug-1933 (89 y.o. [name] Primary  Care Benny Henrie: Tana Conch Other Clinician: Referring Shadow Schedler: Treating Anubis Fundora/Extender: Jaquelyn Bitter in Treatment: 0 Encounter Discharge Information Items Discharge Condition: Stable Ambulatory Status: Ambulatory Discharge Destination: Home Transportation: Private Auto Accompanied By: spouse Schedule Follow-up Appointment: Yes Clinical Summary of Care: Patient Declined Electronic Signature(s) Signed: 05/29/2023 4:07:14 PM By: Brenton Grills Entered By: Brenton Grills on 05/27/2023 13:52:03 -------------------------------------------------------------------------------- Lower Extremity Assessment Details Patient Name: Date of Service: [name], Richard V ID H. 05/27/2023 12:30 PM Medical Record Number: [id number] Patient Account Number: [id number] Date of Birth/Sex: Treating RN: 20-Sep-1933 (89 y.o. [name] Primary Care Kayon Dozier: Tana Conch Other Clinician: Referring Stephaun Million: Treating Wave Calzada/Extender: Jaquelyn Bitter in Treatment: 0 [name], [name] ([id number]) [id number] Page 4 of 8 Edema Assessment Assessed: [Left: No] [Right: No] [Left: Edema] [Right: :] Calf Left: Right: Point of Measurement: From Medial Instep 45.2 cm Ankle Left: Right: Point of Measurement: From Medial Instep 29.1 cm Vascular Assessment Pulses: Dorsalis Pedis Palpable: [Left:Yes] Extremity colors, hair growth, and conditions: Hair Growth on Extremity: [Left:No] Temperature of Extremity: [Left:Warm] Capillary Refill: [Left:< 3 seconds] Dependent Rubor: [Left:No] Blanched when Elevated: [Left:No] Lipodermatosclerosis: [Left:No] Blood Pressure: Brachial: [Left:113] [Right:113] Ankle: [Left:Dorsalis Pedis: 120 1.06] [Right:Dorsalis Pedis: 118 1.04] Toe Nail Assessment Left: Right: Thick: Yes Discolored: Yes Deformed: Yes Improper Length and Hygiene: Yes Electronic Signature(s) Signed: 05/29/2023  4:07:14 PM By: Brenton Grills Entered By: Brenton Grills on 05/27/2023 13:01:07 -------------------------------------------------------------------------------- Multi Wound Chart Details Patient Name: Date of Service: [name], Richard V ID H. 05/27/2023 12:30 PM Medical Record Number: [id number] Patient Account Number: [id number] Date of Birth/Sex: Treating RN: [birth year] (89 y.o. M) Primary Care Creg Gilmer: Tana Conch Other Clinician: Referring Azarias Chiou: Treating Makeba Delcastillo/Extender: Jaquelyn Bitter in Treatment: 0 Vital Signs Height(in): 75 Pulse(bpm): 65 Weight(lbs): 238 Blood Pressure(mmHg): 113/59 Body Mass Index(BMI): 29.7 Temperature(F): 98.2 Respiratory Rate(breaths/min): 18 [1:Photos:] [N/A:N/A] Left, Lateral Lower Leg N/A N/A Wound Location: Trauma N/A N/A Wounding Event: Trauma, Other N/A N/A Primary Etiology: Arrhythmia, Congestive Heart Failure, N/A N/A Comorbid History: Coronary Artery Disease, Hypertension, Myocardial

## 2023-05-30 ENCOUNTER — Ambulatory Visit (HOSPITAL_BASED_OUTPATIENT_CLINIC_OR_DEPARTMENT_OTHER): Payer: Medicare Other | Admitting: General Surgery

## 2023-06-03 ENCOUNTER — Encounter (HOSPITAL_BASED_OUTPATIENT_CLINIC_OR_DEPARTMENT_OTHER): Payer: Medicare Other | Admitting: General Surgery

## 2023-06-03 DIAGNOSIS — S81802A Unspecified open wound, left lower leg, initial encounter: Secondary | ICD-10-CM | POA: Diagnosis not present

## 2023-06-03 DIAGNOSIS — N189 Chronic kidney disease, unspecified: Secondary | ICD-10-CM | POA: Diagnosis not present

## 2023-06-03 DIAGNOSIS — I272 Pulmonary hypertension, unspecified: Secondary | ICD-10-CM | POA: Diagnosis not present

## 2023-06-03 DIAGNOSIS — L97828 Non-pressure chronic ulcer of other part of left lower leg with other specified severity: Secondary | ICD-10-CM | POA: Diagnosis not present

## 2023-06-03 DIAGNOSIS — I130 Hypertensive heart and chronic kidney disease with heart failure and stage 1 through stage 4 chronic kidney disease, or unspecified chronic kidney disease: Secondary | ICD-10-CM | POA: Diagnosis not present

## 2023-06-03 DIAGNOSIS — I2510 Atherosclerotic heart disease of native coronary artery without angina pectoris: Secondary | ICD-10-CM | POA: Diagnosis not present

## 2023-06-03 DIAGNOSIS — I509 Heart failure, unspecified: Secondary | ICD-10-CM | POA: Diagnosis not present

## 2023-06-03 NOTE — Progress Notes (Addendum)
BOBY, PODGORNY ([id number]) [id number] Page 1 of 8 Visit Report for 06/03/2023 Chief Complaint Document Details Patient Name: Date of Service: [name] ID H. 06/03/2023 7:30 A M Medical Record Number: [id number] Patient Account Number: [id number] Date of Birth/Sex: Treating RN: October 06, 1933 (89 y.o. M) Primary Care Provider: Tana Conch Other Clinician: Referring Provider: Treating Provider/Extender: Arvin Collard in Treatment: 1 Information Obtained from: Patient Chief Complaint 05/27/2023; patient comes in today with a traumatic wound on the left anterior lower leg Electronic Signature(s) Signed: 06/03/2023 7:54:41 AM By: [name] Entered By: Duanne Guess on 06/03/2023 04:54:41 -------------------------------------------------------------------------------- Debridement Details Patient Name: Date of Service: [name], [name] 06/03/2023 7:30 A M Medical Record Number: [id number] Patient Account Number: [id number] Date of Birth/Sex: Treating RN: 07/04/1934 (89 y.o. [name] Primary Care Provider: Tana Conch Other Clinician: Referring Provider: Treating Provider/Extender: Arvin Collard in Treatment: 1 Debridement Performed for Assessment: Wound #1 Left,Lateral Lower Leg Performed By: Physician Duanne Guess, MD The following information was scribed by: [name] The information was scribed for: Duanne Guess Debridement Type: Debridement Level of Consciousness (Pre-procedure): Awake and Alert Pre-procedure Verification/Time Out Yes - 07:55 Taken: Start Time: 07:57 Pain Control: Lidocaine 4% T opical Solution Percent of Wound Bed Debrided: 100% T Area Debrided (cm): otal 47.26 Tissue and other material debrided: Viable, Non-Viable, Blood Clots, Eschar, Fat, Slough, Subcutaneous, Slough, Other: sutures Level: Skin/Subcutaneous  Tissue Debridement Description: Excisional Instrument: Blade, Forceps, Scissors Specimen: Tissue Culture Number of Specimens T aken: 1 Bleeding: Minimum Hemostasis Achieved: Pressure Procedural Pain: 7 Post Procedural Pain: 5 Response to Treatment: Procedure was tolerated well Level of Consciousness (Post- Awake and Alert procedure): Post Debridement Measurements of Total Wound Length: (cm) 14 Width: (cm) 4.3 Depth: (cm) 1.3 Minteer, Stanly H ([id number]) [id number] Page 2 of 8 Volume: (cm) 61.465 Character of Wound/Ulcer Post Debridement: Requires Further Debridement Post Procedure Diagnosis Same as Pre-procedure Electronic Signature(s) Signed: 06/03/2023 11:01:28 AM By: [name] Signed: 06/03/2023 5:25:08 PM By: [name] RN, BSN Entered By: [name] on 06/03/2023 05:08:47 -------------------------------------------------------------------------------- HPI Details Patient Name: Date of Service: [name], [name] 06/03/2023 7:30 A M Medical Record Number: [id number] Patient Account Number: [id number] Date of Birth/Sex: Treating RN: 25-Mar-1934 (89 y.o. [name] Primary Care Provider: Tana Conch Other Clinician: Referring Provider: Treating Provider/Extender: Arvin Collard in Treatment: 1 History of Present Illness HPI Description: ADMISSION 05/27/2023; The patient is a 89 year old man who traumatized his left leg on a bed frame in [place] on . The wound was initially repaired in the ER with 17 sutures. He has been seen twice in urgent care on 10/8. He was given mupirocin and doxycycline. He was again seen in urgent care on 10/10 felt to have cellulitis and had Cipro added to the doxycycline which she is still taking. The patient is on Eliquis for history of sick sinus syndrome and atrial fibrillation. The patient is not complaining of a lot of pain. He is able to show me  pictures of progressive skin necrosis just lateral to the original wound. Past medical history includes sick sinus syndrome, coronary artery disease, aortic stenosis, chronic kidney disease. He is on Eliquis ABI in our clinic at 1.04 on the right and 1.06 on the left 06/03/2023: The cellulitis is still fairly persistent. There are multiple sutures still intact but they are pulling through the soft tissue. There is an odor coming  BOBY, PODGORNY ([id number]) [id number] Page 1 of 8 Visit Report for 06/03/2023 Chief Complaint Document Details Patient Name: Date of Service: [name] ID H. 06/03/2023 7:30 A M Medical Record Number: [id number] Patient Account Number: [id number] Date of Birth/Sex: Treating RN: October 06, 1933 (89 y.o. M) Primary Care Provider: Tana Conch Other Clinician: Referring Provider: Treating Provider/Extender: Arvin Collard in Treatment: 1 Information Obtained from: Patient Chief Complaint 05/27/2023; patient comes in today with a traumatic wound on the left anterior lower leg Electronic Signature(s) Signed: 06/03/2023 7:54:41 AM By: [name] Entered By: Duanne Guess on 06/03/2023 04:54:41 -------------------------------------------------------------------------------- Debridement Details Patient Name: Date of Service: [name], [name] 06/03/2023 7:30 A M Medical Record Number: [id number] Patient Account Number: [id number] Date of Birth/Sex: Treating RN: 07/04/1934 (89 y.o. [name] Primary Care Provider: Tana Conch Other Clinician: Referring Provider: Treating Provider/Extender: Arvin Collard in Treatment: 1 Debridement Performed for Assessment: Wound #1 Left,Lateral Lower Leg Performed By: Physician Duanne Guess, MD The following information was scribed by: [name] The information was scribed for: Duanne Guess Debridement Type: Debridement Level of Consciousness (Pre-procedure): Awake and Alert Pre-procedure Verification/Time Out Yes - 07:55 Taken: Start Time: 07:57 Pain Control: Lidocaine 4% T opical Solution Percent of Wound Bed Debrided: 100% T Area Debrided (cm): otal 47.26 Tissue and other material debrided: Viable, Non-Viable, Blood Clots, Eschar, Fat, Slough, Subcutaneous, Slough, Other: sutures Level: Skin/Subcutaneous  Tissue Debridement Description: Excisional Instrument: Blade, Forceps, Scissors Specimen: Tissue Culture Number of Specimens T aken: 1 Bleeding: Minimum Hemostasis Achieved: Pressure Procedural Pain: 7 Post Procedural Pain: 5 Response to Treatment: Procedure was tolerated well Level of Consciousness (Post- Awake and Alert procedure): Post Debridement Measurements of Total Wound Length: (cm) 14 Width: (cm) 4.3 Depth: (cm) 1.3 Minteer, Stanly H ([id number]) [id number] Page 2 of 8 Volume: (cm) 61.465 Character of Wound/Ulcer Post Debridement: Requires Further Debridement Post Procedure Diagnosis Same as Pre-procedure Electronic Signature(s) Signed: 06/03/2023 11:01:28 AM By: [name] Signed: 06/03/2023 5:25:08 PM By: [name] RN, BSN Entered By: [name] on 06/03/2023 05:08:47 -------------------------------------------------------------------------------- HPI Details Patient Name: Date of Service: [name], [name] 06/03/2023 7:30 A M Medical Record Number: [id number] Patient Account Number: [id number] Date of Birth/Sex: Treating RN: 25-Mar-1934 (89 y.o. [name] Primary Care Provider: Tana Conch Other Clinician: Referring Provider: Treating Provider/Extender: Arvin Collard in Treatment: 1 History of Present Illness HPI Description: ADMISSION 05/27/2023; The patient is a 89 year old man who traumatized his left leg on a bed frame in [place] on . The wound was initially repaired in the ER with 17 sutures. He has been seen twice in urgent care on 10/8. He was given mupirocin and doxycycline. He was again seen in urgent care on 10/10 felt to have cellulitis and had Cipro added to the doxycycline which she is still taking. The patient is on Eliquis for history of sick sinus syndrome and atrial fibrillation. The patient is not complaining of a lot of pain. He is able to show me  pictures of progressive skin necrosis just lateral to the original wound. Past medical history includes sick sinus syndrome, coronary artery disease, aortic stenosis, chronic kidney disease. He is on Eliquis ABI in our clinic at 1.04 on the right and 1.06 on the left 06/03/2023: The cellulitis is still fairly persistent. There are multiple sutures still intact but they are pulling through the soft tissue. There is an odor coming  from the wound. Electronic Signature(s) Signed: 06/03/2023 8:24:35 AM By: [name] Entered By: Duanne Guess on 06/03/2023 05:24:35 -------------------------------------------------------------------------------- Physical Exam Details Patient Name: Date of Service: [name], [name] 06/03/2023 7:30 A M Medical Record Number: [id number] Patient Account Number: [id number] Date of Birth/Sex: Treating RN: April 02, 1934 (89 y.o. [name] Primary Care Provider: Tana Conch Other Clinician: Referring Provider: Treating Provider/Extender: Arvin Collard in Treatment: 1 Constitutional . . . . no acute distress. Respiratory Normal work of breathing on room air. Notes YOUSEF, INLOW ([id number]) [id number] Page 3 of 8 06/03/2023: The cellulitis is still fairly persistent. There are multiple sutures still intact but they are pulling through the soft tissue. There is an odor coming from the wound. Electronic Signature(s) Signed: 06/03/2023 8:25:24 AM By: [name] Entered By: Duanne Guess on 06/03/2023 05:25:24 -------------------------------------------------------------------------------- Physician Orders Details Patient Name: Date of Service: [name], [name] 06/03/2023 7:30 A M Medical Record Number: [id number] Patient Account Number: [id number] Date of Birth/Sex: Treating RN: 23-Jul-1934 (89 y.o. Bayard Hugger, Bonita Quin Primary Care Provider: Tana Conch Other  Clinician: Referring Provider: Treating Provider/Extender: Arvin Collard in Treatment: 1 The following information was scribed by: [name] The information was scribed for: Duanne Guess Verbal / Phone Orders: No Diagnosis Coding ICD-10 Coding Code Description [phone] Non-pressure chronic ulcer of other part of left lower leg with other specified severity S80.12XD Contusion of left lower leg, subsequent encounter L03.116 Cellulitis of left lower limb T45.515S Adverse effect of anticoagulants, sequela Follow-up Appointments ppointment in 1 week. - Dr. Lady Gary - RM 1 Return A Monday 10/28 @ 1:15 pm Anesthetic (In clinic) Topical Lidocaine 4% applied to wound bed Bathing/ Shower/ Hygiene May shower with protection but do not get wound dressing(s) wet. Protect dressing(s) with water repellant cover (for example, large plastic bag) or a cast cover and may then take shower. Edema Control - Lymphedema / SCD / Other Left Lower Extremity Elevate legs to the level of the heart or above for 30 minutes daily and/or when sitting for 3-4 times a day throughout the day. A void standing for long periods of time. Exercise regularly If compression wraps slide down please call wound center and speak with a nurse. Additional Orders / Instructions Other: - Call us for any fever, chills or copious drainage. Wound Treatment Wound #1 - Lower Leg Wound Laterality: Left, Lateral Peri-Wound Care: Sween Lotion (Moisturizing lotion) 1 x Per Week/30 Days Discharge Instructions: Apply moisturizing lotion as directed Prim Dressing: IODOFLEX 0.9% Cadexomer Iodine Pad 4x6 cm 1 x Per Week/30 Days ary Discharge Instructions: Apply to wound bed as instructed Secondary Dressing: ABD Pad, 8x10 1 x Per Week/30 Days Discharge Instructions: Apply over primary dressing as directed. Secondary Dressing: Zetuvit Plus 4x8 in 1 x Per Week/30 Days Discharge Instructions: Apply over primary  dressing as directed. Compression Wrap: Urgo K2 Lite, (equivalent to a 3 layer) two layer compression system, regular 1 x Per Week/30 Days Discharge Instructions: Apply Urgo K2 Lite as directed (alternative to 3 layer compression). Laboratory Guttenberg, Lenix H ([id number]) [id number] Page 4 of 8 naerobe culture (MICRO) Bacteria identified in Unspecified specimen by A LOINC Code: 635-3 Convenience Name: Anaerobic culture Patient Medications llergies: penicillin, lisinopril, Novocain A Notifications Medication Indication Start End prior to debridement 06/03/2023 lidocaine DOSE topical 4 % cream - cream topical 06/03/2023 levofloxacin DOSE oral 750 mg tablet - 1 tab p.o. daily x 10 days Electronic Signature(s)  Iodine Pad 4x6 cm 1 x Per Week/30 Days ary Discharge Instructions: Apply to wound bed as instructed Secondary Dressing: ABD Pad, 8x10 1 x Per Week/30 Days Discharge Instructions: Apply over primary dressing as directed. Secondary Dressing: Zetuvit Plus 4x8 in 1 x Per Week/30 Days Discharge Instructions: Apply over primary dressing as directed. Com pression Wrap: Urgo K2 Lite, (equivalent to a 3 layer) two layer compression system, regular 1 x Per Week/30 Days Discharge Instructions: Apply Urgo K2 Lite as directed (alternative to 3 layer compression). 06/03/2023: The cellulitis is still fairly persistent. There are multiple sutures still intact but they are pulling through the soft tissue. There is an odor coming from the wound. RODY, PROKOSCH ([id number]) [id number] Page 7 of 8 I removed the sutures with scissors  and forceps. I then pared away the necrotic skin and eschar with forceps and a scalpel. I debrided slough, fat, and subcutaneous tissue from the wound. The wound is in need of further debridement, but secondary to bleeding and patient intolerance, I held off. I did take a culture due to the odor. I have empirically prescribed levofloxacin and will modify this when culture data return, if indicated. We will apply Iodoflex to the wound surface and Urgo light compression. I think once I get the necrotic tissue removed, this would be a good wound to consider for negative pressure wound therapy. He will follow-up in 1 week. Electronic Signature(s) Signed: 06/07/2023 2:17:02 PM By: Shawn Stall RN, BSN Signed: 06/10/2023 8:12:42 AM By: [name] Previous Signature: 06/03/2023 8:30:01 AM Version By: [name] Entered By: Shawn Stall on 06/07/2023 10:06:05 -------------------------------------------------------------------------------- HxROS Details Patient Name: Date of Service: [name], [name] 06/03/2023 7:30 A M Medical Record Number: [id number] Patient Account Number: [id number] Date of Birth/Sex: Treating RN: 08/23/1933 (89 y.o. [name] Primary Care Provider: Tana Conch Other Clinician: Referring Provider: Treating Provider/Extender: Arvin Collard in Treatment: 1 Information Obtained From Chart Respiratory Medical History: Past Medical History Notes: Pulmonary Hypertension Cardiovascular Medical History: Positive for: Arrhythmia - a fib; Congestive Heart Failure; Coronary Artery Disease; Hypertension; Myocardial Infarction Genitourinary Medical History: Past Medical History Notes: BPH, CKD Immunizations Pneumococcal Vaccine: Received Pneumococcal Vaccination: No Implantable Devices No devices added Hospitalization / Surgery History Type of Hospitalization/Surgery cardiac cath cardiac  angiogram angioplasty joint replacement knee arthroscopy tonsillectomy lap chole cataract surgery pilonidal cystectomy Family and Social History Cancer: Yes - Mother; Heart Disease: Yes - Father; Never smoker; Alcohol Use: Never; Drug Use: No History; Financial Concerns: No; Food, Clothing or Shelter Needs: No; Support System Lacking: No; Transportation Concerns: No Electronic Signature(s) Signed: 06/03/2023 11:01:28 AM By: [name] Mehler, Talik H ([id number]) [id number] Page 8 of 8 Signed: 06/03/2023 5:25:08 PM By: [name] RN, BSN Entered By: Duanne Guess on 06/03/2023 05:24:49 -------------------------------------------------------------------------------- SuperBill Details Patient Name: Date of Service: [name], [name] 06/03/2023 Medical Record Number: [id number] Patient Account Number: [id number] Date of Birth/Sex: Treating RN: 1933-09-29 (89 y.o. [name] Primary Care Provider: Tana Conch Other Clinician: Referring Provider: Treating Provider/Extender: Arvin Collard in Treatment: 1 Diagnosis Coding ICD-10 Codes Code Description [phone] Non-pressure chronic ulcer of other part of left lower leg with other specified severity S80.12XD Contusion of left lower leg, subsequent encounter L03.116 Cellulitis of left lower limb T45.515S Adverse effect of anticoagulants, sequela Facility Procedures : CPT4 Code: 18841660 Description: 11042 - DEB SUBQ TISSUE 20 SQ CM/< ICD-10 Diagnosis Description L97.828 Non-pressure  Signed: 06/03/2023 11:01:28 AM By: [name] Previous Signature: 06/03/2023 8:26:42 AM Version By: [name] Entered By: Duanne Guess on 06/03/2023 05:26:58 -------------------------------------------------------------------------------- Problem List Details Patient Name: Date of Service: [name], [name] 06/03/2023 7:30 A M Medical Record Number: [id number] Patient Account Number: [id number] Date of Birth/Sex: Treating RN: May 02, 1934 (89 y.o. [name] Primary Care Provider: Tana Conch Other Clinician: Referring Provider: Treating Provider/Extender: Arvin Collard in Treatment: 1 Active Problems ICD-10 Encounter Code Description Active Date MDM Diagnosis L97.828 Non-pressure chronic ulcer of other part of left lower leg with other specified 05/27/2023 No Yes severity S80.12XD Contusion of left lower leg, subsequent encounter 05/27/2023 No Yes L03.116 Cellulitis of left lower limb 05/27/2023 No Yes T45.515S Adverse effect of anticoagulants, sequela 05/27/2023 No Yes Inactive Problems Resolved Problems Electronic Signature(s) Signed: 06/03/2023 7:54:15 AM By: [name] Entered By: Duanne Guess on 06/03/2023 04:54:14 Biddle, Morty H ([id number]) [id number] Page 5 of 8 -------------------------------------------------------------------------------- Progress Note Details Patient Name: Date of Service: HA Ellwood Dense ID H. 06/03/2023 7:30 A M Medical Record Number: [id number] Patient Account Number: [id number] Date of Birth/Sex: Treating RN: 08-Jan-1934 (89 y.o. [name] Primary Care Provider: Tana Conch Other Clinician: Referring Provider: Treating Provider/Extender: Arvin Collard in Treatment: 1 Subjective Chief Complaint Information obtained from Patient 05/27/2023; patient comes in today with a traumatic wound on the left anterior lower leg History of Present Illness (HPI) ADMISSION 05/27/2023; The patient is a 89 year old man who traumatized his left leg on a bed frame in [place] on . The wound was initially repaired in the ER with 17 sutures. He has been seen twice in urgent care on 10/8. He was given mupirocin and doxycycline. He was again seen in urgent care on 10/10 felt to have cellulitis and had Cipro added to the doxycycline which she is still taking. The patient is on Eliquis for history of sick sinus syndrome and atrial fibrillation. The patient is not complaining of a lot of pain. He is able to show me pictures of progressive skin necrosis just lateral to the original wound. Past medical history includes sick sinus syndrome, coronary artery disease, aortic stenosis, chronic kidney disease. He is on Eliquis ABI in our clinic at 1.04 on the right and 1.06 on the left 06/03/2023: The cellulitis is still fairly persistent. There are multiple sutures still intact but they are pulling through the soft tissue. There is an odor coming from the wound. Patient History Information obtained from Chart. Family History Cancer - Mother, Heart Disease -  Father. Social History Never smoker, Alcohol Use - Never, Drug Use - No History. Medical History Cardiovascular Patient has history of Arrhythmia - a fib, Congestive Heart Failure, Coronary Artery Disease, Hypertension, Myocardial Infarction Hospitalization/Surgery History - cardiac cath. - cardiac angiogram. - angioplasty. - joint replacement. - knee arthroscopy. - tonsillectomy. - lap chole. - cataract surgery. - pilonidal cystectomy. Medical A Surgical History Notes nd Respiratory Pulmonary Hypertension Genitourinary BPH, CKD Objective Constitutional no acute distress. Vitals Time Taken: 7:38 AM, Height: 75 in, Weight: 238 lbs, BMI: 29.7, Temperature: 97.7 F, Pulse: 75 bpm, Respiratory Rate: 18 breaths/min, Blood Pressure: 106/68 mmHg. Respiratory Normal work of breathing on room air. General Notes: 06/03/2023: The cellulitis is still fairly persistent. There are multiple sutures still intact but they are pulling through the soft tissue. There is an odor coming from the wound. Integumentary (Hair, Skin) Broadus, Cornelious H ([id number])  Iodine Pad 4x6 cm 1 x Per Week/30 Days ary Discharge Instructions: Apply to wound bed as instructed Secondary Dressing: ABD Pad, 8x10 1 x Per Week/30 Days Discharge Instructions: Apply over primary dressing as directed. Secondary Dressing: Zetuvit Plus 4x8 in 1 x Per Week/30 Days Discharge Instructions: Apply over primary dressing as directed. Com pression Wrap: Urgo K2 Lite, (equivalent to a 3 layer) two layer compression system, regular 1 x Per Week/30 Days Discharge Instructions: Apply Urgo K2 Lite as directed (alternative to 3 layer compression). 06/03/2023: The cellulitis is still fairly persistent. There are multiple sutures still intact but they are pulling through the soft tissue. There is an odor coming from the wound. RODY, PROKOSCH ([id number]) [id number] Page 7 of 8 I removed the sutures with scissors  and forceps. I then pared away the necrotic skin and eschar with forceps and a scalpel. I debrided slough, fat, and subcutaneous tissue from the wound. The wound is in need of further debridement, but secondary to bleeding and patient intolerance, I held off. I did take a culture due to the odor. I have empirically prescribed levofloxacin and will modify this when culture data return, if indicated. We will apply Iodoflex to the wound surface and Urgo light compression. I think once I get the necrotic tissue removed, this would be a good wound to consider for negative pressure wound therapy. He will follow-up in 1 week. Electronic Signature(s) Signed: 06/07/2023 2:17:02 PM By: Shawn Stall RN, BSN Signed: 06/10/2023 8:12:42 AM By: [name] Previous Signature: 06/03/2023 8:30:01 AM Version By: [name] Entered By: Shawn Stall on 06/07/2023 10:06:05 -------------------------------------------------------------------------------- HxROS Details Patient Name: Date of Service: [name], [name] 06/03/2023 7:30 A M Medical Record Number: [id number] Patient Account Number: [id number] Date of Birth/Sex: Treating RN: 08/23/1933 (89 y.o. [name] Primary Care Provider: Tana Conch Other Clinician: Referring Provider: Treating Provider/Extender: Arvin Collard in Treatment: 1 Information Obtained From Chart Respiratory Medical History: Past Medical History Notes: Pulmonary Hypertension Cardiovascular Medical History: Positive for: Arrhythmia - a fib; Congestive Heart Failure; Coronary Artery Disease; Hypertension; Myocardial Infarction Genitourinary Medical History: Past Medical History Notes: BPH, CKD Immunizations Pneumococcal Vaccine: Received Pneumococcal Vaccination: No Implantable Devices No devices added Hospitalization / Surgery History Type of Hospitalization/Surgery cardiac cath cardiac  angiogram angioplasty joint replacement knee arthroscopy tonsillectomy lap chole cataract surgery pilonidal cystectomy Family and Social History Cancer: Yes - Mother; Heart Disease: Yes - Father; Never smoker; Alcohol Use: Never; Drug Use: No History; Financial Concerns: No; Food, Clothing or Shelter Needs: No; Support System Lacking: No; Transportation Concerns: No Electronic Signature(s) Signed: 06/03/2023 11:01:28 AM By: [name] Mehler, Talik H ([id number]) [id number] Page 8 of 8 Signed: 06/03/2023 5:25:08 PM By: [name] RN, BSN Entered By: Duanne Guess on 06/03/2023 05:24:49 -------------------------------------------------------------------------------- SuperBill Details Patient Name: Date of Service: [name], [name] 06/03/2023 Medical Record Number: [id number] Patient Account Number: [id number] Date of Birth/Sex: Treating RN: 1933-09-29 (89 y.o. [name] Primary Care Provider: Tana Conch Other Clinician: Referring Provider: Treating Provider/Extender: Arvin Collard in Treatment: 1 Diagnosis Coding ICD-10 Codes Code Description [phone] Non-pressure chronic ulcer of other part of left lower leg with other specified severity S80.12XD Contusion of left lower leg, subsequent encounter L03.116 Cellulitis of left lower limb T45.515S Adverse effect of anticoagulants, sequela Facility Procedures : CPT4 Code: [id number] Description: 11042 - DEB SUBQ TISSUE 20 SQ CM/< ICD-10 Diagnosis Description L97.828 Non-pressure

## 2023-06-03 NOTE — Progress Notes (Signed)
By: [name] RN, BSN Entered By: [name] on 06/03/2023 04:44:08 -------------------------------------------------------------------------------- Multi Wound Chart Details Patient Name: Date of Service: [name], Richard V ID H. 06/03/2023 7:30 A M Medical Record Number: [id number] Patient Account Number: [id number] Date of Birth/Sex: Treating RN: 1934-05-22 (89 y.o. M) Primary Care Aylene Acoff: Tana Conch Other Clinician: Referring Jeronica Stlouis: Treating Maebry Obrien/Extender: Arvin Collard in Treatment: 1 Vital Signs Height(in): 75 Pulse(bpm): 75 Weight(lbs): 238 Blood Pressure(mmHg): 106/68 Body Mass Index(BMI): 29.7 Temperature(F): 97.7 Respiratory Rate(breaths/min): 18 [1:Photos:] [N/A:N/A] Left, Lateral Lower Leg N/A N/A Wound Location: Trauma N/A N/A Wounding Event: Trauma, Other N/A N/A Primary Etiology: Arrhythmia, Congestive Heart Failure, N/A N/A Comorbid History: Coronary Artery Disease, Hypertension, Myocardial Infarction 05/14/2023 N/A N/A Date Acquired: 1 N/A N/A Weeks of Treatment: Open N/A N/A Wound Status: No N/A N/A Wound  Recurrence: 14x4.3x1.3 N/A N/A Measurements L x W x D (cm) 47.281 N/A N/A A (cm) : rea 61.465 N/A N/A Volume (cm) : 15.30% N/A N/A % Reduction in Area: -450.70% N/A N/A % Reduction in Volume: Full Thickness Without Exposed N/A N/A Classification: Support Structures Medium N/A N/A Exudate A mount: Serosanguineous N/A N/A Exudate Type: red, brown N/A N/A Exudate Color: Distinct, outline attached N/A N/A Wound Margin: Small (1-33%) N/A N/A Granulation A mount: Red N/A N/A Granulation Quality: Large (67-100%) N/A N/A Necrotic A mount: Eschar N/A N/A Necrotic Tissue: Fat Layer (Subcutaneous Tissue): Yes N/A N/A Exposed Structures: Small (1-33%) N/A N/A Epithelialization: Scarring: No N/A N/A Periwound Skin Texture: Maceration: No N/A N/A Periwound Skin Moisture: Dry/Scaly: No Rubor: Yes N/A N/A Periwound Skin Color: No Abnormality N/A N/A Temperature: Yes N/A N/A Tenderness on Palpation: KAILEB, PERDOMO ([id number]) [id number] Page 4 of 7 Treatment Notes Electronic Signature(s) Signed: 06/03/2023 7:54:32 AM By: [name] Entered By: Duanne Guess on 06/03/2023 04:54:32 -------------------------------------------------------------------------------- Multi-Disciplinary Care Plan Details Patient Name: Date of Service: [name], Richard V ID H. 06/03/2023 7:30 A M Medical Record Number: [id number] Patient Account Number: [id number] Date of Birth/Sex: Treating RN: 1934-06-14 (89 y.o. [name] Primary Care Bert Ptacek: Tana Conch Other Clinician: Referring Fotini Lemus: Treating Yailin Biederman/Extender: Arvin Collard in Treatment: 1 Multidisciplinary Care Plan reviewed with physician Active Inactive Wound/Skin Impairment Nursing Diagnoses: Knowledge deficit related to smoking impact on wound healing Goals: Patient/caregiver will verbalize understanding of skin care regimen Date Initiated:  05/27/2023 Target Resolution Date: 07/13/2023 Goal Status: Active Interventions: Assess patient/caregiver ability to obtain necessary supplies Assess patient/caregiver ability to perform ulcer/skin care regimen upon admission and as needed Assess ulceration(s) every visit Provide education on ulcer and skin care Screen for HBO Treatment Activities: Skin care regimen initiated : 05/27/2023 Topical wound management initiated : 05/27/2023 Notes: Electronic Signature(s) Signed: 06/03/2023 5:25:08 PM By: [name] RN, BSN Entered By: [name] on 06/03/2023 04:53:00 -------------------------------------------------------------------------------- Pain Assessment Details Patient Name: Date of Service: [name], Richard V ID H. 06/03/2023 7:30 A M Medical Record Number: [id number] Patient Account Number: [id number] Date of Birth/Sex: Treating RN: 06-29-1934 (89 y.o. [name] Primary Care Janiylah Hannis: Tana Conch Other Clinician: Referring Mariyah Upshaw: Treating Letoya Stallone/Extender: Arvin Collard in Treatment: 1 Active Problems Yahr, Clydell H ([id number]) [id number] Page 5 of 7 Location of Pain Severity and Description of Pain Patient Has Paino No Site Locations Rate the pain. Current Pain Level: 0 Pain Management and Medication Current Pain Management: Electronic Signature(s) Signed: 06/03/2023 5:25:08 PM By: [name] RN, BSN Entered By: [name] on 06/03/2023 04:39:13 -------------------------------------------------------------------------------- Patient/Caregiver Education  Details Patient Name: Date of Service: Richard Asencion Noble 10/21/2024andnbsp7:30 A M Medical Record Number: [id number] Patient Account Number: [id number] Date of Birth/Gender: Treating RN: [birth year] (89 y.o. [name] Primary Care Physician: Tana Conch Other Clinician: Referring Physician: Treating Physician/Extender:  Arvin Collard in Treatment: 1 Education Assessment Education Provided To: Patient Education Topics Provided Venous: Methods: Explain/Verbal Responses: Reinforcements needed, State content correctly Wound Debridement: Methods: Explain/Verbal Responses: Reinforcements needed, State content correctly Wound/Skin Impairment: Methods: Explain/Verbal Responses: Reinforcements needed, State content correctly Electronic Signature(s) Signed: 06/03/2023 5:25:08 PM By: [name] RN, BSN Entered By: [name] on 06/03/2023 04:53:49 Liptak, Axton H ([id number]) [id number] Page 6 of 7 -------------------------------------------------------------------------------- Wound Assessment Details Patient Name: Date of Service: [name] ID H. 06/03/2023 7:30 A M Medical Record Number: [id number] Patient Account Number: [id number] Date of Birth/Sex: Treating RN: July 13, 1934 (89 y.o. [name] Primary Care Odarius Dines: Tana Conch Other Clinician: Referring Salimatou Simone: Treating Dwana Garin/Extender: Arvin Collard in Treatment: 1 Wound Status Wound Number: 1 Primary Trauma, Other Etiology: Wound Location: Left, Lateral Lower Leg Wound Open Wounding Event: Trauma Status: Date Acquired: 05/14/2023 Comorbid Arrhythmia, Congestive Heart Failure, Coronary Artery Disease, Weeks Of Treatment: 1 History: Hypertension, Myocardial Infarction Clustered Wound: No Photos Wound Measurements Length: (cm) 14 Width: (cm) 4.3 Depth: (cm) 1.3 Area: (cm) 47.281 Volume: (cm) 61.465 % Reduction in Area: 15.3% % Reduction in Volume: -450.7% Epithelialization: Small (1-33%) Tunneling: No Undermining: No Wound Description Classification: Full Thickness Without Exposed Suppor Wound Margin: Distinct, outline attached Exudate Amount: Medium Exudate Type: Serosanguineous Exudate Color: red, brown t Structures Foul  Odor After Cleansing: No Slough/Fibrino Yes Wound Bed Granulation Amount: Small (1-33%) Exposed Structure Granulation Quality: Red Fat Layer (Subcutaneous Tissue) Exposed: Yes Necrotic Amount: Large (67-100%) Necrotic Quality: Eschar Periwound Skin Texture Texture Color No Abnormalities Noted: Yes No Abnormalities Noted: No Rubor: Yes Moisture No Abnormalities Noted: Yes Temperature / Pain Temperature: No Abnormality Tenderness on Palpation: Yes Treatment Notes Wound #1 (Lower Leg) Wound Laterality: Left, Lateral Cleanser Peri-Wound Care Sween Lotion (Moisturizing lotion) Mccarey, Damone H ([id number]) [id number] Page 7 of 7 Discharge Instruction: Apply moisturizing lotion as directed Topical Primary Dressing IODOFLEX 0.9% Cadexomer Iodine Pad 4x6 cm Discharge Instruction: Apply to wound bed as instructed Secondary Dressing ABD Pad, 8x10 Discharge Instruction: Apply over primary dressing as directed. Zetuvit Plus 4x8 in Discharge Instruction: Apply over primary dressing as directed. Secured With Compression Wrap Urgo K2 Lite, (equivalent to a 3 layer) two layer compression system, regular Discharge Instruction: Apply Urgo K2 Lite as directed (alternative to 3 layer compression). Compression Stockings Add-Ons Electronic Signature(s) Signed: 06/03/2023 5:25:08 PM By: [name] RN, BSN Entered By: [name] on 06/03/2023 04:51:05 -------------------------------------------------------------------------------- Vitals Details Patient Name: Date of Service: [name], Richard V ID H. 06/03/2023 7:30 A M Medical Record Number: [id number] Patient Account Number: [id number] Date of Birth/Sex: Treating RN: Jan 02, 1934 (89 y.o. [name] Primary Care Mylinh Cragg: Tana Conch Other Clinician: Referring Evelene Roussin: Treating Duvid Smalls/Extender: Arvin Collard in Treatment: 1 Vital Signs Time Taken: 07:38 Temperature  (F): 97.7 Height (in): 75 Pulse (bpm): 75 Weight (lbs): 238 Respiratory Rate (breaths/min): 18 Body Mass Index (BMI): 29.7 Blood Pressure (mmHg): 106/68 Reference Range: 80 - 120 mg / dl Electronic Signature(s) Signed: 06/03/2023 5:25:08 PM By: [name] RN, BSN Entered By: [name] on 06/03/2023 05:41:43  Details Patient Name: Date of Service: Richard Asencion Noble 10/21/2024andnbsp7:30 A M Medical Record Number: [id number] Patient Account Number: [id number] Date of Birth/Gender: Treating RN: [birth year] (89 y.o. [name] Primary Care Physician: Tana Conch Other Clinician: Referring Physician: Treating Physician/Extender:  Arvin Collard in Treatment: 1 Education Assessment Education Provided To: Patient Education Topics Provided Venous: Methods: Explain/Verbal Responses: Reinforcements needed, State content correctly Wound Debridement: Methods: Explain/Verbal Responses: Reinforcements needed, State content correctly Wound/Skin Impairment: Methods: Explain/Verbal Responses: Reinforcements needed, State content correctly Electronic Signature(s) Signed: 06/03/2023 5:25:08 PM By: [name] RN, BSN Entered By: [name] on 06/03/2023 04:53:49 Liptak, Axton H ([id number]) [id number] Page 6 of 7 -------------------------------------------------------------------------------- Wound Assessment Details Patient Name: Date of Service: [name] ID H. 06/03/2023 7:30 A M Medical Record Number: [id number] Patient Account Number: [id number] Date of Birth/Sex: Treating RN: July 13, 1934 (89 y.o. [name] Primary Care Odarius Dines: Tana Conch Other Clinician: Referring Salimatou Simone: Treating Dwana Garin/Extender: Arvin Collard in Treatment: 1 Wound Status Wound Number: 1 Primary Trauma, Other Etiology: Wound Location: Left, Lateral Lower Leg Wound Open Wounding Event: Trauma Status: Date Acquired: 05/14/2023 Comorbid Arrhythmia, Congestive Heart Failure, Coronary Artery Disease, Weeks Of Treatment: 1 History: Hypertension, Myocardial Infarction Clustered Wound: No Photos Wound Measurements Length: (cm) 14 Width: (cm) 4.3 Depth: (cm) 1.3 Area: (cm) 47.281 Volume: (cm) 61.465 % Reduction in Area: 15.3% % Reduction in Volume: -450.7% Epithelialization: Small (1-33%) Tunneling: No Undermining: No Wound Description Classification: Full Thickness Without Exposed Suppor Wound Margin: Distinct, outline attached Exudate Amount: Medium Exudate Type: Serosanguineous Exudate Color: red, brown t Structures Foul  Odor After Cleansing: No Slough/Fibrino Yes Wound Bed Granulation Amount: Small (1-33%) Exposed Structure Granulation Quality: Red Fat Layer (Subcutaneous Tissue) Exposed: Yes Necrotic Amount: Large (67-100%) Necrotic Quality: Eschar Periwound Skin Texture Texture Color No Abnormalities Noted: Yes No Abnormalities Noted: No Rubor: Yes Moisture No Abnormalities Noted: Yes Temperature / Pain Temperature: No Abnormality Tenderness on Palpation: Yes Treatment Notes Wound #1 (Lower Leg) Wound Laterality: Left, Lateral Cleanser Peri-Wound Care Sween Lotion (Moisturizing lotion) Mccarey, Damone H ([id number]) [id number] Page 7 of 7 Discharge Instruction: Apply moisturizing lotion as directed Topical Primary Dressing IODOFLEX 0.9% Cadexomer Iodine Pad 4x6 cm Discharge Instruction: Apply to wound bed as instructed Secondary Dressing ABD Pad, 8x10 Discharge Instruction: Apply over primary dressing as directed. Zetuvit Plus 4x8 in Discharge Instruction: Apply over primary dressing as directed. Secured With Compression Wrap Urgo K2 Lite, (equivalent to a 3 layer) two layer compression system, regular Discharge Instruction: Apply Urgo K2 Lite as directed (alternative to 3 layer compression). Compression Stockings Add-Ons Electronic Signature(s) Signed: 06/03/2023 5:25:08 PM By: [name] RN, BSN Entered By: [name] on 06/03/2023 04:51:05 -------------------------------------------------------------------------------- Vitals Details Patient Name: Date of Service: [name], Richard V ID H. 06/03/2023 7:30 A M Medical Record Number: [id number] Patient Account Number: [id number] Date of Birth/Sex: Treating RN: Jan 02, 1934 (89 y.o. [name] Primary Care Mylinh Cragg: Tana Conch Other Clinician: Referring Evelene Roussin: Treating Duvid Smalls/Extender: Arvin Collard in Treatment: 1 Vital Signs Time Taken: 07:38 Temperature  (F): 97.7 Height (in): 75 Pulse (bpm): 75 Weight (lbs): 238 Respiratory Rate (breaths/min): 18 Body Mass Index (BMI): 29.7 Blood Pressure (mmHg): 106/68 Reference Range: 80 - 120 mg / dl Electronic Signature(s) Signed: 06/03/2023 5:25:08 PM By: [name] RN, BSN Entered By: [name] on 06/03/2023 05:41:43

## 2023-06-05 ENCOUNTER — Encounter (HOSPITAL_BASED_OUTPATIENT_CLINIC_OR_DEPARTMENT_OTHER): Payer: Medicare Other | Admitting: Physician Assistant

## 2023-06-06 ENCOUNTER — Ambulatory Visit: Payer: Medicare Other | Attending: Cardiology

## 2023-06-06 VITALS — HR 62 | Ht 75.0 in | Wt 226.6 lb

## 2023-06-06 DIAGNOSIS — I480 Paroxysmal atrial fibrillation: Secondary | ICD-10-CM | POA: Diagnosis not present

## 2023-06-06 NOTE — Progress Notes (Signed)
Nurse Visit   Date of Encounter: 06/06/2023 ID: [name], DOB 1934-06-23, MRN [id number]  PCP:  Shelva Majestic, MD   [facility] Providers Cardiologist:  Donato Schultz, MD Electrophysiologist:  Regan Lemming, MD      Visit Details   VS:  Pulse 62   Ht 6\' 3"  (1.905 m)   Wt 226 lb 9.6 oz (102.8 kg)   BMI 28.32 kg/m  , BMI Body mass index is 28.32 kg/m.  Wt Readings from Last 3 Encounters:  06/06/23 226 lb 9.6 oz (102.8 kg)  05/22/23 233 lb 12.8 oz (106.1 kg)  03/25/23 221 lb (100.2 kg)     Reason for visit: EKG Performed today: EKG,Vitals, Provider consulted: Dr. Odis Hollingshead, Education Changes (medications, testing, etc.) : None Length of Visit: 10 minutes    Medications Adjustments/Labs and Tests Ordered: Orders Placed This Encounter  Procedures   EKG 12-Lead   Patient will continue to hold amiodarone per Dr. Odis Hollingshead. He has F/U with Renee 11/6. Patient verbalized understanding   Signed, Judene Companion, LPN  40/05/2724 4:19 PM

## 2023-06-10 ENCOUNTER — Ambulatory Visit (HOSPITAL_BASED_OUTPATIENT_CLINIC_OR_DEPARTMENT_OTHER): Payer: Medicare Other | Admitting: General Surgery

## 2023-06-11 ENCOUNTER — Encounter (HOSPITAL_BASED_OUTPATIENT_CLINIC_OR_DEPARTMENT_OTHER): Payer: Medicare Other | Admitting: General Surgery

## 2023-06-11 DIAGNOSIS — I130 Hypertensive heart and chronic kidney disease with heart failure and stage 1 through stage 4 chronic kidney disease, or unspecified chronic kidney disease: Secondary | ICD-10-CM | POA: Diagnosis not present

## 2023-06-11 DIAGNOSIS — L97828 Non-pressure chronic ulcer of other part of left lower leg with other specified severity: Secondary | ICD-10-CM | POA: Diagnosis not present

## 2023-06-11 DIAGNOSIS — S81802A Unspecified open wound, left lower leg, initial encounter: Secondary | ICD-10-CM | POA: Diagnosis not present

## 2023-06-11 DIAGNOSIS — I272 Pulmonary hypertension, unspecified: Secondary | ICD-10-CM | POA: Diagnosis not present

## 2023-06-11 DIAGNOSIS — I509 Heart failure, unspecified: Secondary | ICD-10-CM | POA: Diagnosis not present

## 2023-06-11 DIAGNOSIS — N189 Chronic kidney disease, unspecified: Secondary | ICD-10-CM | POA: Diagnosis not present

## 2023-06-11 DIAGNOSIS — I2510 Atherosclerotic heart disease of native coronary artery without angina pectoris: Secondary | ICD-10-CM | POA: Diagnosis not present

## 2023-06-11 NOTE — Progress Notes (Signed)
Inactive Wound/Skin Impairment Nursing Diagnoses: HOARCE, PELFREY ([id number]) [id number] Page 5 of 8 Knowledge deficit related to smoking impact on wound healing Goals: Patient/caregiver will verbalize understanding of skin care regimen Date Initiated: 05/27/2023 Target Resolution Date: 07/13/2023 Goal Status: Active Interventions: Assess patient/caregiver ability to obtain necessary supplies Assess patient/caregiver ability to perform ulcer/skin care regimen upon admission and as  needed Assess ulceration(s) every visit Provide education on ulcer and skin care Screen for HBO Treatment Activities: Skin care regimen initiated : 05/27/2023 Topical wound management initiated : 05/27/2023 Notes: Electronic Signature(s) Signed: 06/11/2023 1:45:14 PM By: Samuella Bruin Entered By: Samuella Bruin on 06/11/2023 11:35:52 -------------------------------------------------------------------------------- Pain Assessment Details Patient Name: Date of Service: [name], [name]. 06/11/2023 11:00 A M Medical Record Number: [id number] Patient Account Number: [id number] Date of Birth/Sex: Treating RN: June 07, 1934 (89 y.o. [name] Primary Care Taneah Masri: Tana Conch Other Clinician: Referring Deamonte Sayegh: Treating Rooney Gladwin/Extender: Arvin Collard in Treatment: 2 Active Problems Location of Pain Severity and Description of Pain Patient Has Paino No Site Locations Rate the pain. Current Pain Level: 0 Pain Management and Medication Current Pain Management: Electronic Signature(s) Signed: 06/11/2023 1:45:14 PM By: Samuella Bruin Entered By: Samuella Bruin on 06/11/2023 11:14:29 Sterbenz, Loni [name] ([id number]) [id number] Page 6 of 8 -------------------------------------------------------------------------------- Patient/Caregiver Education Details Patient Name: Date of Service: Richard [name] 10/29/2024andnbsp11:00 A M Medical Record Number: [id number] Patient Account Number: [id number] Date of Birth/Gender: Treating RN: 03-30-1934 (89 y.o. [name] Primary Care Physician: Tana Conch Other Clinician: Referring Physician: Treating Physician/Extender: Arvin Collard in Treatment: 2 Education Assessment Education Provided To: Patient Education Topics Provided Wound/Skin Impairment: Methods: Explain/Verbal Responses: Reinforcements needed, State  content correctly Electronic Signature(s) Signed: 06/11/2023 1:45:14 PM By: Samuella Bruin Entered By: Samuella Bruin on 06/11/2023 11:36:04 -------------------------------------------------------------------------------- Wound Assessment Details Patient Name: Date of Service: [name], [name]. 06/11/2023 11:00 A M Medical Record Number: [id number] Patient Account Number: [id number] Date of Birth/Sex: Treating RN: 1934-03-08 (89 y.o. [name] Primary Care Alazay Leicht: Tana Conch Other Clinician: Referring Kerman Pfost: Treating Vibha Ferdig/Extender: Arvin Collard in Treatment: 2 Wound Status Wound Number: 1 Primary Trauma, Other Etiology: Wound Location: Left, Lateral Lower Leg Wound Open Wounding Event: Trauma Status: Date Acquired: 05/14/2023 Comorbid Arrhythmia, Congestive Heart Failure, Coronary Artery Disease, Weeks Of Treatment: 2 History: Hypertension, Myocardial Infarction Clustered Wound: No Photos Wound Measurements Length: (cm) 12.3 Width: (cm) 4 [name], [name] ([id number]) Depth: (cm) 0.7 Area: (cm) 38.642 Volume: (cm) 27.049 % Reduction in Area: 30.8% % Reduction in Volume: -142.4% [id number] Page 7 of 8 Epithelialization: Small (1-33%) Tunneling: No Undermining: No Wound Description Classification: Full Thickness Without Exposed Support Structures Wound Margin: Distinct, outline attached Exudate Amount: Large Exudate Type: Serosanguineous Exudate Color: red, brown Foul Odor After Cleansing: No Slough/Fibrino Yes Wound Bed Granulation Amount: Large (67-100%) Exposed Structure Granulation Quality: Red, Hyper-granulation Fascia Exposed: No Necrotic Amount: Small (1-33%) Fat Layer (Subcutaneous Tissue) Exposed: Yes Necrotic Quality: Eschar, Adherent Slough Tendon Exposed: No Muscle Exposed: No Joint Exposed: No Bone Exposed: No Periwound Skin Texture Texture Color No  Abnormalities Noted: Yes No Abnormalities Noted: No Rubor: Yes Moisture No Abnormalities Noted: Yes Temperature / Pain Temperature: No Abnormality Tenderness on Palpation: Yes Treatment Notes Wound #1 (Lower Leg) Wound Laterality: Left, Lateral Cleanser Soap and Water Discharge Instruction: May shower and wash wound with dial antibacterial soap and water prior to dressing change. Wound Cleanser Discharge Instruction: Cleanse the wound with  Inactive Wound/Skin Impairment Nursing Diagnoses: HOARCE, PELFREY ([id number]) [id number] Page 5 of 8 Knowledge deficit related to smoking impact on wound healing Goals: Patient/caregiver will verbalize understanding of skin care regimen Date Initiated: 05/27/2023 Target Resolution Date: 07/13/2023 Goal Status: Active Interventions: Assess patient/caregiver ability to obtain necessary supplies Assess patient/caregiver ability to perform ulcer/skin care regimen upon admission and as  needed Assess ulceration(s) every visit Provide education on ulcer and skin care Screen for HBO Treatment Activities: Skin care regimen initiated : 05/27/2023 Topical wound management initiated : 05/27/2023 Notes: Electronic Signature(s) Signed: 06/11/2023 1:45:14 PM By: Samuella Bruin Entered By: Samuella Bruin on 06/11/2023 11:35:52 -------------------------------------------------------------------------------- Pain Assessment Details Patient Name: Date of Service: [name], [name]. 06/11/2023 11:00 A M Medical Record Number: [id number] Patient Account Number: [id number] Date of Birth/Sex: Treating RN: June 07, 1934 (89 y.o. [name] Primary Care Taneah Masri: Tana Conch Other Clinician: Referring Deamonte Sayegh: Treating Rooney Gladwin/Extender: Arvin Collard in Treatment: 2 Active Problems Location of Pain Severity and Description of Pain Patient Has Paino No Site Locations Rate the pain. Current Pain Level: 0 Pain Management and Medication Current Pain Management: Electronic Signature(s) Signed: 06/11/2023 1:45:14 PM By: Samuella Bruin Entered By: Samuella Bruin on 06/11/2023 11:14:29 Sterbenz, Loni [name] ([id number]) [id number] Page 6 of 8 -------------------------------------------------------------------------------- Patient/Caregiver Education Details Patient Name: Date of Service: Richard [name] 10/29/2024andnbsp11:00 A M Medical Record Number: [id number] Patient Account Number: [id number] Date of Birth/Gender: Treating RN: 03-30-1934 (89 y.o. [name] Primary Care Physician: Tana Conch Other Clinician: Referring Physician: Treating Physician/Extender: Arvin Collard in Treatment: 2 Education Assessment Education Provided To: Patient Education Topics Provided Wound/Skin Impairment: Methods: Explain/Verbal Responses: Reinforcements needed, State  content correctly Electronic Signature(s) Signed: 06/11/2023 1:45:14 PM By: Samuella Bruin Entered By: Samuella Bruin on 06/11/2023 11:36:04 -------------------------------------------------------------------------------- Wound Assessment Details Patient Name: Date of Service: [name], [name]. 06/11/2023 11:00 A M Medical Record Number: [id number] Patient Account Number: [id number] Date of Birth/Sex: Treating RN: 1934-03-08 (89 y.o. [name] Primary Care Alazay Leicht: Tana Conch Other Clinician: Referring Kerman Pfost: Treating Vibha Ferdig/Extender: Arvin Collard in Treatment: 2 Wound Status Wound Number: 1 Primary Trauma, Other Etiology: Wound Location: Left, Lateral Lower Leg Wound Open Wounding Event: Trauma Status: Date Acquired: 05/14/2023 Comorbid Arrhythmia, Congestive Heart Failure, Coronary Artery Disease, Weeks Of Treatment: 2 History: Hypertension, Myocardial Infarction Clustered Wound: No Photos Wound Measurements Length: (cm) 12.3 Width: (cm) 4 [name], [name] ([id number]) Depth: (cm) 0.7 Area: (cm) 38.642 Volume: (cm) 27.049 % Reduction in Area: 30.8% % Reduction in Volume: -142.4% [id number] Page 7 of 8 Epithelialization: Small (1-33%) Tunneling: No Undermining: No Wound Description Classification: Full Thickness Without Exposed Support Structures Wound Margin: Distinct, outline attached Exudate Amount: Large Exudate Type: Serosanguineous Exudate Color: red, brown Foul Odor After Cleansing: No Slough/Fibrino Yes Wound Bed Granulation Amount: Large (67-100%) Exposed Structure Granulation Quality: Red, Hyper-granulation Fascia Exposed: No Necrotic Amount: Small (1-33%) Fat Layer (Subcutaneous Tissue) Exposed: Yes Necrotic Quality: Eschar, Adherent Slough Tendon Exposed: No Muscle Exposed: No Joint Exposed: No Bone Exposed: No Periwound Skin Texture Texture Color No  Abnormalities Noted: Yes No Abnormalities Noted: No Rubor: Yes Moisture No Abnormalities Noted: Yes Temperature / Pain Temperature: No Abnormality Tenderness on Palpation: Yes Treatment Notes Wound #1 (Lower Leg) Wound Laterality: Left, Lateral Cleanser Soap and Water Discharge Instruction: May shower and wash wound with dial antibacterial soap and water prior to dressing change. Wound Cleanser Discharge Instruction: Cleanse the wound with  CHARLEY, LAFRANCE ([id number]) [id number] Page 1 of 8 Visit Report for 06/11/2023 Arrival Information Details Patient Name: Date of Service: [name] [name]. 06/11/2023 11:00 A M Medical Record Number: [id number] Patient Account Number: [id number] Date of Birth/Sex: Treating RN: 03/28/1934 (89 y.o. [name] Primary Care Yashar Inclan: Tana Conch Other Clinician: Referring Nahal Wanless: Treating Shyah Cadmus/Extender: Arvin Collard in Treatment: 2 Visit Information History Since Last Visit Added or deleted any medications: No Patient Arrived: Ambulatory Any new allergies or adverse reactions: No Arrival Time: 11:11 Had a fall or experienced change in No Accompanied By: self activities of daily living that may affect Transfer Assistance: None risk of falls: Patient Identification Verified: Yes Signs or symptoms of abuse/neglect since last visito No Secondary Verification Process Completed: Yes Hospitalized since last visit: No Patient Requires Transmission-Based Precautions: No Implantable device outside of the clinic excluding No Patient Has Alerts: No cellular tissue based products placed in the center since last visit: Has Dressing in Place as Prescribed: Yes Has Compression in Place as Prescribed: Yes Pain Present Now: No Electronic Signature(s) Signed: 06/11/2023 1:45:14 PM By: Samuella Bruin Entered By: Samuella Bruin on 06/11/2023 11:14:03 -------------------------------------------------------------------------------- Compression Therapy Details Patient Name: Date of Service: [name], [name]. 06/11/2023 11:00 A M Medical Record Number: [id number] Patient Account Number: [id number] Date of Birth/Sex: Treating RN: October 05, 1933 (89 y.o. [name] Primary Care Everson Mott: Tana Conch Other Clinician: Referring Dois Juarbe: Treating Nayan Proch/Extender: Arvin Collard in  Treatment: 2 Compression Therapy Performed for Wound Assessment: Wound #1 Left,Lateral Lower Leg Performed By: Clinician Samuella Bruin, RN Compression Type: Double Layer Post Procedure Diagnosis Same as Pre-procedure Electronic Signature(s) Signed: 06/11/2023 1:45:14 PM By: Gelene Mink By: Samuella Bruin on 06/11/2023 11:37:00 Hoston, Dereon [name] ([id number]) [id number] Page 2 of 8 -------------------------------------------------------------------------------- Encounter Discharge Information Details Patient Name: Date of Service: Richard Ellwood Dense [name]. 06/11/2023 11:00 A M Medical Record Number: [id number] Patient Account Number: [id number] Date of Birth/Sex: Treating RN: 1933-10-14 (88 y.o. [name] Primary Care Javarious Elsayed: Tana Conch Other Clinician: Referring Hassel Uphoff: Treating Cedrica Brune/Extender: Arvin Collard in Treatment: 2 Encounter Discharge Information Items Post Procedure Vitals Discharge Condition: Stable Temperature (F): 97.9 Ambulatory Status: Ambulatory Pulse (bpm): 87 Discharge Destination: Home Respiratory Rate (breaths/min): 16 Transportation: Private Auto Blood Pressure (mmHg): 115/70 Accompanied By: self Schedule Follow-up Appointment: Yes Clinical Summary of Care: Patient Declined Electronic Signature(s) Signed: 06/11/2023 1:45:14 PM By: Samuella Bruin Entered By: Samuella Bruin on 06/11/2023 11:49:26 -------------------------------------------------------------------------------- Lower Extremity Assessment Details Patient Name: Date of Service: [name], [name]. 06/11/2023 11:00 A M Medical Record Number: [id number] Patient Account Number: [id number] Date of Birth/Sex: Treating RN: 11-28-1933 (89 y.o. [name] Primary Care Kady Toothaker: Tana Conch Other Clinician: Referring Aland Chestnutt: Treating Evah Rashid/Extender: Arvin Collard in Treatment: 2 Edema Assessment Assessed: Kyra Searles: No] [Right: No] Edema: [Left: Ye] [Right: s] Calf Left: Right: Point of Measurement: From Medial Instep 39 cm Ankle Left: Right: Point of Measurement: From Medial Instep 26 cm Vascular Assessment Pulses: Dorsalis Pedis Palpable: [Left:Yes] Extremity colors, hair growth, and conditions: Extremity Color: [Left:Hyperpigmented] Hair Growth on Extremity: [Left:No] Temperature of Extremity: [Left:Warm] Capillary Refill: [Left:< 3 seconds] Dependent Rubor: [Left:No No] Electronic Signature(s) Signed: 06/11/2023 1:45:14 PM By: Samuella Bruin Entered By: Samuella Bruin on 06/11/2023 11:20:17 Porr, Caldwell [name] ([id number]) [id number] Page 3 of 8 -------------------------------------------------------------------------------- Multi Wound Chart Details Patient Name: Date of Service: [name], Richard V ID  wound cleanser prior to applying a clean dressing using gauze sponges, not tissue or cotton balls. Peri-Wound Care Sween Lotion (Moisturizing lotion) Discharge Instruction: Apply moisturizing lotion as directed Topical Primary Dressing IODOFLEX 0.9% Cadexomer Iodine Pad 4x6 cm Discharge Instruction: Apply to wound bed as instructed Secondary Dressing ABD Pad, 8x10 Discharge Instruction: Apply over primary dressing as directed. Zetuvit Plus 4x8 in Discharge Instruction: Apply over primary dressing as directed. Secured With Compression Wrap Urgo K2 Lite, (equivalent to a 3 layer) two layer compression system, regular Discharge Instruction: Apply Urgo K2 Lite as directed (alternative to 3 layer compression). Compression Stockings Add-Ons Electronic Signature(s) Signed: 06/11/2023 1:45:14 PM By: Samuella Bruin Entered By: Samuella Bruin on 06/11/2023 11:37:11 Pettibone, Braiden [name] ([id number]) [id number] Page 8 of 8 -------------------------------------------------------------------------------- Vitals Details Patient Name: Date of Service: Richard Ellwood Dense [name]. 06/11/2023 11:00 A M Medical Record Number: [id number] Patient Account Number: [id number] Date of Birth/Sex: Treating RN: 08-Apr-1934 (89 y.o. [name] Primary Care Kadden Osterhout: Tana Conch Other Clinician: Referring Sanika Brosious: Treating Yareliz Thorstenson/Extender: Arvin Collard in Treatment: 2 Vital Signs Time Taken:  11:14 Temperature (F): 97.9 Height (in): 75 Pulse (bpm): 87 Weight (lbs): 238 Respiratory Rate (breaths/min): 16 Body Mass Index (BMI): 29.7 Blood Pressure (mmHg): 115/70 Reference Range: 80 - 120 mg / dl Electronic Signature(s) Signed: 06/11/2023 1:45:14 PM By: Samuella Bruin Entered By: Samuella Bruin on 06/11/2023 11:14:23

## 2023-06-11 NOTE — Progress Notes (Signed)
The culture that I took last week returned with E. coli and staph species, sensitive to the Levaquin I had prescribed. He is currently taking this. Electronic Signature(s) Signed: 06/11/2023 12:09:27 PM By: [name] Entered By: Duanne Guess on 06/11/2023 12:09:26 -------------------------------------------------------------------------------- Physical Exam Details Patient Name: Date of Service: [name], [name]. 06/11/2023 11:00 A M Medical Record Number: [id number] Patient Account Number: [id number] Date of Birth/Sex: Treating RN: 15-May-1934 (89 y.o. M) Primary Care Provider: Tana Conch Other Clinician: Referring Provider: Treating Provider/Extender: Arvin Collard in Treatment: 2 Constitutional . . . . no acute distress. Respiratory Normal work of breathing on room air.. Notes 06/11/2023: The wound is much cleaner this week. There is no longer any odor. There is still a fair amount of slough present with eschar around the edges. IGNAZIO, MOSCONE ([id number]) [id number] Page 3 of 8 Electronic Signature(s) Signed: 06/11/2023 12:10:27 PM By: [name] Entered By: Duanne Guess on 06/11/2023 12:10:26 -------------------------------------------------------------------------------- Physician Orders Details Patient Name: Date of Service: [name], [name]. 06/11/2023 11:00 A M Medical Record Number: [id number] Patient Account Number: [id number] Date of Birth/Sex: Treating RN: 09-24-1933  (89 y.o. [name] Primary Care Provider: Tana Conch Other Clinician: Referring Provider: Treating Provider/Extender: Arvin Collard in Treatment: 2 The following information was scribed by: Samuella Bruin The information was scribed for: Duanne Guess Verbal / Phone Orders: No Diagnosis Coding Follow-up Appointments ppointment in 1 week. - Dr. Lady Gary - RM 1 Return A Anesthetic (In clinic) Topical Lidocaine 4% applied to wound bed Bathing/ Shower/ Hygiene May shower with protection but do not get wound dressing(s) wet. Protect dressing(s) with water repellant cover (for example, large plastic bag) or a cast cover and may then take shower. Negative Presssure Wound Therapy Other: - run IVR for peel and place vac Additional Orders / Instructions Other: - Call us for any fever, chills or copious drainage. Lymphedema Treatment Plan - Exercise, Compression and Elevation Exercise daily as tolerated. (Walking, ROM, Calf Pumps and Toe Taps) Compression Wraps as ordered Elevate legs 30 - 60 minutes at or above heart level at least 3 - 4 times daily as able/tolerated Avoid standing for long periods and elevate leg(s) parallel to the floor when sitting Wound Treatment Wound #1 - Lower Leg Wound Laterality: Left, Lateral Cleanser: Soap and Water 1 x Per Week/30 Days Discharge Instructions: May shower and wash wound with dial antibacterial soap and water prior to dressing change. Cleanser: Wound Cleanser 1 x Per Week/30 Days Discharge Instructions: Cleanse the wound with wound cleanser prior to applying a clean dressing using gauze sponges, not tissue or cotton balls. Peri-Wound Care: Sween Lotion (Moisturizing lotion) 1 x Per Week/30 Days Discharge Instructions: Apply moisturizing lotion as directed Prim Dressing: IODOFLEX 0.9% Cadexomer Iodine Pad 4x6 cm 1 x Per Week/30 Days ary Discharge Instructions: Apply to wound bed as  instructed Secondary Dressing: ABD Pad, 8x10 1 x Per Week/30 Days Discharge Instructions: Apply over primary dressing as directed. Secondary Dressing: Zetuvit Plus 4x8 in 1 x Per Week/30 Days Discharge Instructions: Apply over primary dressing as directed. Compression Wrap: Urgo K2 Lite, (equivalent to a 3 layer) two layer compression system, regular 1 x Per Week/30 Days Discharge Instructions: Apply Urgo K2 Lite as directed (alternative to 3 layer compression). Patient Medications llergies: penicillin, lisinopril, Novocain A Notifications Medication Indication [address] PINTO, Givanni [name] ([id number]) [id number] Page 4 of 8 06/11/2023 lidocaine DOSE topical 4 %  The culture that I took last week returned with E. coli and staph species, sensitive to the Levaquin I had prescribed. He is currently taking this. Electronic Signature(s) Signed: 06/11/2023 12:09:27 PM By: [name] Entered By: Duanne Guess on 06/11/2023 12:09:26 -------------------------------------------------------------------------------- Physical Exam Details Patient Name: Date of Service: [name], [name]. 06/11/2023 11:00 A M Medical Record Number: [id number] Patient Account Number: [id number] Date of Birth/Sex: Treating RN: 15-May-1934 (89 y.o. M) Primary Care Provider: Tana Conch Other Clinician: Referring Provider: Treating Provider/Extender: Arvin Collard in Treatment: 2 Constitutional . . . . no acute distress. Respiratory Normal work of breathing on room air.. Notes 06/11/2023: The wound is much cleaner this week. There is no longer any odor. There is still a fair amount of slough present with eschar around the edges. IGNAZIO, MOSCONE ([id number]) [id number] Page 3 of 8 Electronic Signature(s) Signed: 06/11/2023 12:10:27 PM By: [name] Entered By: Duanne Guess on 06/11/2023 12:10:26 -------------------------------------------------------------------------------- Physician Orders Details Patient Name: Date of Service: [name], [name]. 06/11/2023 11:00 A M Medical Record Number: [id number] Patient Account Number: [id number] Date of Birth/Sex: Treating RN: 09-24-1933  (89 y.o. [name] Primary Care Provider: Tana Conch Other Clinician: Referring Provider: Treating Provider/Extender: Arvin Collard in Treatment: 2 The following information was scribed by: Samuella Bruin The information was scribed for: Duanne Guess Verbal / Phone Orders: No Diagnosis Coding Follow-up Appointments ppointment in 1 week. - Dr. Lady Gary - RM 1 Return A Anesthetic (In clinic) Topical Lidocaine 4% applied to wound bed Bathing/ Shower/ Hygiene May shower with protection but do not get wound dressing(s) wet. Protect dressing(s) with water repellant cover (for example, large plastic bag) or a cast cover and may then take shower. Negative Presssure Wound Therapy Other: - run IVR for peel and place vac Additional Orders / Instructions Other: - Call us for any fever, chills or copious drainage. Lymphedema Treatment Plan - Exercise, Compression and Elevation Exercise daily as tolerated. (Walking, ROM, Calf Pumps and Toe Taps) Compression Wraps as ordered Elevate legs 30 - 60 minutes at or above heart level at least 3 - 4 times daily as able/tolerated Avoid standing for long periods and elevate leg(s) parallel to the floor when sitting Wound Treatment Wound #1 - Lower Leg Wound Laterality: Left, Lateral Cleanser: Soap and Water 1 x Per Week/30 Days Discharge Instructions: May shower and wash wound with dial antibacterial soap and water prior to dressing change. Cleanser: Wound Cleanser 1 x Per Week/30 Days Discharge Instructions: Cleanse the wound with wound cleanser prior to applying a clean dressing using gauze sponges, not tissue or cotton balls. Peri-Wound Care: Sween Lotion (Moisturizing lotion) 1 x Per Week/30 Days Discharge Instructions: Apply moisturizing lotion as directed Prim Dressing: IODOFLEX 0.9% Cadexomer Iodine Pad 4x6 cm 1 x Per Week/30 Days ary Discharge Instructions: Apply to wound bed as  instructed Secondary Dressing: ABD Pad, 8x10 1 x Per Week/30 Days Discharge Instructions: Apply over primary dressing as directed. Secondary Dressing: Zetuvit Plus 4x8 in 1 x Per Week/30 Days Discharge Instructions: Apply over primary dressing as directed. Compression Wrap: Urgo K2 Lite, (equivalent to a 3 layer) two layer compression system, regular 1 x Per Week/30 Days Discharge Instructions: Apply Urgo K2 Lite as directed (alternative to 3 layer compression). Patient Medications llergies: penicillin, lisinopril, Novocain A Notifications Medication Indication [address] PINTO, Givanni [name] ([id number]) [id number] Page 4 of 8 06/11/2023 lidocaine DOSE topical 4 %  CONSTANTINOS, ARBALLO ([id number]) [id number] Page 1 of 8 Visit Report for 06/11/2023 Chief Complaint Document Details Patient Name: Date of Service: [name] [name]. 06/11/2023 11:00 A M Medical Record Number: [id number] Patient Account Number: [id number] Date of Birth/Sex: Treating RN: 04/19/1934 (89 y.o. M) Primary Care Provider: Tana Conch Other Clinician: Referring Provider: Treating Provider/Extender: Arvin Collard in Treatment: 2 Information Obtained from: Patient Chief Complaint 05/27/2023; patient comes in today with a traumatic wound on the left anterior lower leg Electronic Signature(s) Signed: 06/11/2023 12:08:37 PM By: [name] Entered By: Duanne Guess on 06/11/2023 12:08:37 -------------------------------------------------------------------------------- Debridement Details Patient Name: Date of Service: [name], [name]. 06/11/2023 11:00 A M Medical Record Number: [id number] Patient Account Number: [id number] Date of Birth/Sex: Treating RN: 1933-09-12 (89 y.o. [name] Primary Care Provider: Tana Conch Other Clinician: Referring Provider: Treating Provider/Extender: Arvin Collard in Treatment: 2 Debridement Performed for Assessment: Wound #1 Left,Lateral Lower Leg Performed By: Physician Duanne Guess, MD The following information was scribed by: Samuella Bruin The information was scribed for: Duanne Guess Debridement Type: Debridement Level of Consciousness (Pre-procedure): Awake and Alert Pre-procedure Verification/Time Out Yes - 11:27 Taken: Start Time: 11:27 Pain Control: Lidocaine 4% Topical Solution Percent of Wound Bed Debrided: 100% T Area Debrided (cm): otal 38.62 Tissue and other material debrided: Non-Viable, Eschar, Slough, Subcutaneous, Slough Level: Skin/Subcutaneous Tissue Debridement Description:  Excisional Instrument: Curette Bleeding: Minimum Hemostasis Achieved: Pressure Response to Treatment: Procedure was tolerated well Level of Consciousness (Post- Awake and Alert procedure): Post Debridement Measurements of Total Wound Length: (cm) 12.3 Width: (cm) 4 Depth: (cm) 0.7 Volume: (cm) 27.049 Character of Wound/Ulcer Post Debridement: Improved Post Procedure Diagnosis Braud, Harrie [name] ([id number]) [id number] Page 2 of 8 Same as Pre-procedure Electronic Signature(s) Signed: 06/11/2023 12:21:27 PM By: [name] Signed: 06/11/2023 1:45:14 PM By: Gelene Mink By: Samuella Bruin on 06/11/2023 11:36:51 -------------------------------------------------------------------------------- HPI Details Patient Name: Date of Service: [name], [name]. 06/11/2023 11:00 A M Medical Record Number: [id number] Patient Account Number: [id number] Date of Birth/Sex: Treating RN: 26-Oct-1933 (89 y.o. M) Primary Care Provider: Tana Conch Other Clinician: Referring Provider: Treating Provider/Extender: Arvin Collard in Treatment: 2 History of Present Illness HPI Description: ADMISSION 05/27/2023; The patient is a 89 year old man who traumatized his left leg on a bed frame in [place] on . The wound was initially repaired in the ER with 17 sutures. He has been seen twice in urgent care on 10/8. He was given mupirocin and doxycycline. He was again seen in urgent care on 10/10 felt to have cellulitis and had Cipro added to the doxycycline which she is still taking. The patient is on Eliquis for history of sick sinus syndrome and atrial fibrillation. The patient is not complaining of a lot of pain. He is able to show me pictures of progressive skin necrosis just lateral to the original wound. Past medical history includes sick sinus syndrome, coronary artery disease, aortic stenosis, chronic kidney disease.  He is on Eliquis ABI in our clinic at 1.04 on the right and 1.06 on the left 06/03/2023: The cellulitis is still fairly persistent. There are multiple sutures still intact but they are pulling through the soft tissue. There is an odor coming from the wound. 06/11/2023: The wound is much cleaner this week. There is no longer any odor. There is still a fair amount of slough present with eschar around the edges.  1 x Per Week/30 Days Discharge Instructions: Cleanse the wound with wound cleanser prior to applying a clean dressing using gauze sponges, not tissue or cotton balls. Peri-Wound Care: Sween Lotion (Moisturizing lotion) 1 x Per Week/30 Days Discharge Instructions: Apply moisturizing lotion as directed Prim Dressing: IODOFLEX 0.9% Cadexomer Iodine Pad 4x6 cm 1 x Per Week/30 Days ary Discharge Instructions: Apply to wound bed as instructed Secondary Dressing: ABD Pad, 8x10 1 x Per Week/30 Days Discharge Instructions: Apply over primary dressing as directed. Secondary Dressing: Zetuvit Plus 4x8 in 1 x Per Week/30 Days Discharge Instructions: Apply over primary dressing as directed. Com pression Wrap: Urgo K2 Lite, (equivalent to a 3 layer) two layer compression system, regular 1 x Per Week/30 Days Discharge Instructions: Apply Urgo K2 Lite as directed (alternative to 3 layer compression). 06/11/2023: The wound is  much cleaner this week. There is no longer any odor. There is still a fair amount of slough present with eschar around the edges. The culture that I took last week returned with E. coli and staph species, sensitive to the Levaquin I had prescribed. He is currently taking this. I used a curette to debride slough, subcutaneous tissue, and eschar from his wound. I think 1 more week of Iodoflex should get this clean enough that we can look at applying negative pressure wound therapy. We are going to submit an IVR to his insurance for this. He will complete the course of oral levofloxacin. Continue Urgo light compression wrap. Follow-up in 1 week. Electronic Signature(s) Signed: 06/11/2023 12:16:54 PM By: [name] Entered By: Duanne Guess on 06/11/2023 12:16:54 Ildefonso, Adolph [name] ([id number]) [id number] Page 7 of 8 -------------------------------------------------------------------------------- HxROS Details Patient Name: Date of Service: Richard Ellwood Dense [name]. 06/11/2023 11:00 A M Medical Record Number: [id number] Patient Account Number: [id number] Date of Birth/Sex: Treating RN: 1933/12/19 (89 y.o. M) Primary Care Provider: Tana Conch Other Clinician: Referring Provider: Treating Provider/Extender: Arvin Collard in Treatment: 2 Information Obtained From Chart Respiratory Medical History: Past Medical History Notes: Pulmonary Hypertension Cardiovascular Medical History: Positive for: Arrhythmia - a fib; Congestive Heart Failure; Coronary Artery Disease; Hypertension; Myocardial Infarction Genitourinary Medical History: Past Medical History Notes: BPH, CKD Immunizations Pneumococcal Vaccine: Received Pneumococcal Vaccination: No Implantable Devices No devices added Hospitalization / Surgery History Type of Hospitalization/Surgery cardiac cath cardiac angiogram angioplasty joint replacement knee  arthroscopy tonsillectomy lap chole cataract surgery pilonidal cystectomy Family and Social History Cancer: Yes - Mother; Heart Disease: Yes - Father; Never smoker; Alcohol Use: Never; Drug Use: No History; Financial Concerns: No; Food, Clothing or Shelter Needs: No; Support System Lacking: No; Transportation Concerns: No Electronic Signature(s) Signed: 06/11/2023 12:21:27 PM By: [name] Entered By: Duanne Guess on 06/11/2023 12:09:47 -------------------------------------------------------------------------------- SuperBill Details Patient Name: Date of Service: [name], [name]. 06/11/2023 Dietrick, Devaun [name] ([id number]) [id number] Page 8 of 8 Medical Record Number: [id number] Patient Account Number: [id number] Date of Birth/Sex: Treating RN: 1933/09/27 (89 y.o. M) Primary Care Provider: Tana Conch Other Clinician: Referring Provider: Treating Provider/Extender: Arvin Collard in Treatment: 2 Diagnosis Coding ICD-10 Codes Code Description [phone] Non-pressure chronic ulcer of other part of left lower leg with other specified severity S80.12XD Contusion of left lower leg, subsequent encounter L03.116 Cellulitis of left lower limb T45.515S Adverse effect of anticoagulants, sequela Facility Procedures : CPT4 Code: [id number] Description: 11042 - DEB SUBQ TISSUE 20 SQ CM/< ICD-10 Diagnosis Description L97.828 Non-pressure chronic ulcer of other part of left  The culture that I took last week returned with E. coli and staph species, sensitive to the Levaquin I had prescribed. He is currently taking this. Electronic Signature(s) Signed: 06/11/2023 12:09:27 PM By: [name] Entered By: Duanne Guess on 06/11/2023 12:09:26 -------------------------------------------------------------------------------- Physical Exam Details Patient Name: Date of Service: [name], [name]. 06/11/2023 11:00 A M Medical Record Number: [id number] Patient Account Number: [id number] Date of Birth/Sex: Treating RN: 15-May-1934 (89 y.o. M) Primary Care Provider: Tana Conch Other Clinician: Referring Provider: Treating Provider/Extender: Arvin Collard in Treatment: 2 Constitutional . . . . no acute distress. Respiratory Normal work of breathing on room air.. Notes 06/11/2023: The wound is much cleaner this week. There is no longer any odor. There is still a fair amount of slough present with eschar around the edges. IGNAZIO, MOSCONE ([id number]) [id number] Page 3 of 8 Electronic Signature(s) Signed: 06/11/2023 12:10:27 PM By: [name] Entered By: Duanne Guess on 06/11/2023 12:10:26 -------------------------------------------------------------------------------- Physician Orders Details Patient Name: Date of Service: [name], [name]. 06/11/2023 11:00 A M Medical Record Number: [id number] Patient Account Number: [id number] Date of Birth/Sex: Treating RN: 09-24-1933  (89 y.o. [name] Primary Care Provider: Tana Conch Other Clinician: Referring Provider: Treating Provider/Extender: Arvin Collard in Treatment: 2 The following information was scribed by: Samuella Bruin The information was scribed for: Duanne Guess Verbal / Phone Orders: No Diagnosis Coding Follow-up Appointments ppointment in 1 week. - Dr. Lady Gary - RM 1 Return A Anesthetic (In clinic) Topical Lidocaine 4% applied to wound bed Bathing/ Shower/ Hygiene May shower with protection but do not get wound dressing(s) wet. Protect dressing(s) with water repellant cover (for example, large plastic bag) or a cast cover and may then take shower. Negative Presssure Wound Therapy Other: - run IVR for peel and place vac Additional Orders / Instructions Other: - Call us for any fever, chills or copious drainage. Lymphedema Treatment Plan - Exercise, Compression and Elevation Exercise daily as tolerated. (Walking, ROM, Calf Pumps and Toe Taps) Compression Wraps as ordered Elevate legs 30 - 60 minutes at or above heart level at least 3 - 4 times daily as able/tolerated Avoid standing for long periods and elevate leg(s) parallel to the floor when sitting Wound Treatment Wound #1 - Lower Leg Wound Laterality: Left, Lateral Cleanser: Soap and Water 1 x Per Week/30 Days Discharge Instructions: May shower and wash wound with dial antibacterial soap and water prior to dressing change. Cleanser: Wound Cleanser 1 x Per Week/30 Days Discharge Instructions: Cleanse the wound with wound cleanser prior to applying a clean dressing using gauze sponges, not tissue or cotton balls. Peri-Wound Care: Sween Lotion (Moisturizing lotion) 1 x Per Week/30 Days Discharge Instructions: Apply moisturizing lotion as directed Prim Dressing: IODOFLEX 0.9% Cadexomer Iodine Pad 4x6 cm 1 x Per Week/30 Days ary Discharge Instructions: Apply to wound bed as  instructed Secondary Dressing: ABD Pad, 8x10 1 x Per Week/30 Days Discharge Instructions: Apply over primary dressing as directed. Secondary Dressing: Zetuvit Plus 4x8 in 1 x Per Week/30 Days Discharge Instructions: Apply over primary dressing as directed. Compression Wrap: Urgo K2 Lite, (equivalent to a 3 layer) two layer compression system, regular 1 x Per Week/30 Days Discharge Instructions: Apply Urgo K2 Lite as directed (alternative to 3 layer compression). Patient Medications llergies: penicillin, lisinopril, Novocain A Notifications Medication Indication [address] PINTO, Givanni [name] ([id number]) [id number] Page 4 of 8 06/11/2023 lidocaine DOSE topical 4 %  1 x Per Week/30 Days Discharge Instructions: Cleanse the wound with wound cleanser prior to applying a clean dressing using gauze sponges, not tissue or cotton balls. Peri-Wound Care: Sween Lotion (Moisturizing lotion) 1 x Per Week/30 Days Discharge Instructions: Apply moisturizing lotion as directed Prim Dressing: IODOFLEX 0.9% Cadexomer Iodine Pad 4x6 cm 1 x Per Week/30 Days ary Discharge Instructions: Apply to wound bed as instructed Secondary Dressing: ABD Pad, 8x10 1 x Per Week/30 Days Discharge Instructions: Apply over primary dressing as directed. Secondary Dressing: Zetuvit Plus 4x8 in 1 x Per Week/30 Days Discharge Instructions: Apply over primary dressing as directed. Com pression Wrap: Urgo K2 Lite, (equivalent to a 3 layer) two layer compression system, regular 1 x Per Week/30 Days Discharge Instructions: Apply Urgo K2 Lite as directed (alternative to 3 layer compression). 06/11/2023: The wound is  much cleaner this week. There is no longer any odor. There is still a fair amount of slough present with eschar around the edges. The culture that I took last week returned with E. coli and staph species, sensitive to the Levaquin I had prescribed. He is currently taking this. I used a curette to debride slough, subcutaneous tissue, and eschar from his wound. I think 1 more week of Iodoflex should get this clean enough that we can look at applying negative pressure wound therapy. We are going to submit an IVR to his insurance for this. He will complete the course of oral levofloxacin. Continue Urgo light compression wrap. Follow-up in 1 week. Electronic Signature(s) Signed: 06/11/2023 12:16:54 PM By: [name] Entered By: Duanne Guess on 06/11/2023 12:16:54 Ildefonso, Adolph [name] ([id number]) [id number] Page 7 of 8 -------------------------------------------------------------------------------- HxROS Details Patient Name: Date of Service: Richard Ellwood Dense [name]. 06/11/2023 11:00 A M Medical Record Number: [id number] Patient Account Number: [id number] Date of Birth/Sex: Treating RN: 1933/12/19 (89 y.o. M) Primary Care Provider: Tana Conch Other Clinician: Referring Provider: Treating Provider/Extender: Arvin Collard in Treatment: 2 Information Obtained From Chart Respiratory Medical History: Past Medical History Notes: Pulmonary Hypertension Cardiovascular Medical History: Positive for: Arrhythmia - a fib; Congestive Heart Failure; Coronary Artery Disease; Hypertension; Myocardial Infarction Genitourinary Medical History: Past Medical History Notes: BPH, CKD Immunizations Pneumococcal Vaccine: Received Pneumococcal Vaccination: No Implantable Devices No devices added Hospitalization / Surgery History Type of Hospitalization/Surgery cardiac cath cardiac angiogram angioplasty joint replacement knee  arthroscopy tonsillectomy lap chole cataract surgery pilonidal cystectomy Family and Social History Cancer: Yes - Mother; Heart Disease: Yes - Father; Never smoker; Alcohol Use: Never; Drug Use: No History; Financial Concerns: No; Food, Clothing or Shelter Needs: No; Support System Lacking: No; Transportation Concerns: No Electronic Signature(s) Signed: 06/11/2023 12:21:27 PM By: [name] Entered By: Duanne Guess on 06/11/2023 12:09:47 -------------------------------------------------------------------------------- SuperBill Details Patient Name: Date of Service: [name], [name]. 06/11/2023 Dietrick, Devaun [name] ([id number]) [id number] Page 8 of 8 Medical Record Number: [id number] Patient Account Number: [id number] Date of Birth/Sex: Treating RN: 1933/09/27 (89 y.o. M) Primary Care Provider: Tana Conch Other Clinician: Referring Provider: Treating Provider/Extender: Arvin Collard in Treatment: 2 Diagnosis Coding ICD-10 Codes Code Description [phone] Non-pressure chronic ulcer of other part of left lower leg with other specified severity S80.12XD Contusion of left lower leg, subsequent encounter L03.116 Cellulitis of left lower limb T45.515S Adverse effect of anticoagulants, sequela Facility Procedures : CPT4 Code: [id number] Description: 11042 - DEB SUBQ TISSUE 20 SQ CM/< ICD-10 Diagnosis Description L97.828 Non-pressure chronic ulcer of other part of left

## 2023-06-16 NOTE — Progress Notes (Unsigned)
Cardiology Office Note Date:  06/16/2023  Patient ID:  [name], [name] 10/06/1933, MRN [id number] PCP:  Shelva Majestic, MD  Cardiologist:  Dr. Anne Fu Electrophysiologist: Dr. Elberta Fortis   Chief Complaint:  *** 6 mo  History of Present Illness: [name] is a 89 y.o. male with history of CAD (PCI with drug-eluting stents to the RCA and LAD 11/20/16), CKD (IV), renal calculi, HTN, HLD, SSSx w/PPM, dilated aortic root, NSVT, PEs, DVT, NSVT, AFib, RBBB  He saw Dr. Anne Fu 06/02/20.  Planned to monitor root via echos annually.  Discussed ongoing SOB, following with pulmonary had PFT shows mixed obstructive and restrictive disease.  Has an elevated right hemidiaphragm.  Fairly unexplained multifactoral shortness of breath.  He did uncover obstructive lung disease probably mild asthma.  Albuterol utilized. No changes were made.  ---  Pt messaged Jan 2023 that in his visit with pulmonary was discussed that adjustment in his pacemaker may provide some benefit to his SOB and advised to come in  In review of Dr. Kavin Leech note: "at least some component of deconditioning.  He does have restriction and obstruction on pulmonary function testing, unclear how much the obstruction is contributing.  I think will be reasonable to do a trial of ICS/LABA to see if his breathing improves.  We will start Symbicort 80.  He may benefit going forward from pulmonary rehab.  May also need to follow with Dr. Anne Fu, consider echocardiogram, evaluation of his pacer, etc. to ensure everything optimized."  I saw him 10/06/21 He reports his SOB is chronic, goes back years, wether is is getting worse, hard for him to say, not better. No CP, no palpitations He has not yet gotten authorization from his Insurance for the Symbicort He [place], gets SOB with walking eventually, cant walk as far as could years ago,, does ride a stationary bike, not as SOB with this He has edema R>L that has  been there for years, 2/2 to an old fracture he got in the [employer] and a cyst or clot behind his knee No near syncope or syncope. No bleeding or signs of bleeding Rate response was ON, ambulating in the office with good HR excursion to the 80's, not felt to have programming options to improve his DOE VP% low at 16% SOB sounded chronic, though planned to revisit echo/his AS Planned to reduce eliquis dose for Creat/age  Echo with preserved LVEF, grade II DD, RV ok, RVSP 45, AS described as moderate and unchanged from 2021  Saw Dr. Elberta Fortis Jan 2024, had a recent hospitalization at [facility], diastolic HF, AFib, diuresed and started on amiodarone Feeling better though still fatigued., SOB better Toprol stopped in effort to reduce fatigue.  Seeing cards team a few times since, most recently D. Dunn, PA-C 05/22/23, stable DOE, none with routine activity.recent laceration to his leg being managed with wound clinic. New RBBB, LAD, and prolonged QT, on d/w Dr. Elberta Fortis, amiodarone stopped  Not felt needed to repeat ischemic eval with no symptoms.   06/06/23: EKG SR 61bpm, QTC 493, not accounting for QRS  *** symptoms *** AF burden *** volume *** eliquis, bleeding, dose, labs  Device information MDT dual chamber PPM implanted 08/28/2017   AAD hx Amiodarone started Jan 2024 > Stopped Oct 2024 2/2 prolonged QT   Past Medical History:  Diagnosis Date   Arthritis    "back, knees" (11/21/2016)   Bladder stones    CAD (coronary  artery disease)    11/20/16 PCI with DES--> LAD/RCA   Chronic lower back pain    CKD (chronic kidney disease), stage IV (HCC)    Hattie Perch 11/08/2016   Dilatation of aorta (HCC)    Elevated hemidiaphragm    History of gout    History of kidney stones 12/02/2008   Hyperlipidemia    Hypertension    Myocardial infarction [facility])    "silent" (11/21/2016)   NSVT (nonsustained ventricular tachycardia) (HCC)    Pacemaker    PAF (paroxysmal atrial fibrillation) (HCC)    PSA,  INCREASED 12/05/2009   Pulmonary disease    Pulmonary emboli (HCC)    Pulmonary hypertension (HCC)    Recurrent deep venous thrombosis (HCC)    UTI (urinary tract infection)     Past Surgical History:  Procedure Laterality Date   CARDIAC CATHETERIZATION  11/14/2016   CATARACT EXTRACTION W/ INTRAOCULAR LENS  IMPLANT, BILATERAL     CORONARY ANGIOPLASTY WITH STENT PLACEMENT  11/21/2016   "2 stents"    CORONARY ATHERECTOMY N/A 11/21/2016   Procedure: Coronary Atherectomy;  Surgeon: Marykay Lex, MD;  Location: [facility] INVASIVE CV LAB;  Service: Cardiovascular;  Laterality: N/A;  RCA and LAD   CORONARY PRESSURE/FFR STUDY N/A 05/22/2019   Procedure: INTRAVASCULAR PRESSURE WIRE/FFR STUDY;  Surgeon: Yvonne Kendall, MD;  Location: [facility] INVASIVE CV LAB;  Service: Cardiovascular;  Laterality: N/A;   CORONARY STENT INTERVENTION N/A 11/21/2016   Procedure: Coronary Stent Intervention;  Surgeon: Marykay Lex, MD;  Location: [facility] INVASIVE CV LAB;  Service: Cardiovascular;  Laterality: N/A;  RCA and LAD   CYSTOSCOPY W/ STONE MANIPULATION     JOINT REPLACEMENT     KNEE ARTHROSCOPY Left    LAPAROSCOPIC CHOLECYSTECTOMY     LEFT HEART CATH N/A 11/21/2016   Procedure: Left Heart Cath;  Surgeon: Marykay Lex, MD;  Location: [facility] INVASIVE CV LAB;  Service: Cardiovascular;  Laterality: N/A;   LITHOTRIPSY     PILONIDAL CYST EXCISION  56   RIGHT/LEFT HEART CATH AND CORONARY ANGIOGRAPHY N/A 11/14/2016   Procedure: Right/Left Heart Cath and Coronary Angiography;  Surgeon: Marykay Lex, MD;  Location: [facility] INVASIVE CV LAB;  Service: Cardiovascular;  Laterality: N/A;   RIGHT/LEFT HEART CATH AND CORONARY ANGIOGRAPHY N/A 05/22/2019   Procedure: RIGHT/LEFT HEART CATH AND CORONARY ANGIOGRAPHY;  Surgeon: Yvonne Kendall, MD;  Location: [facility] INVASIVE CV LAB;  Service: Cardiovascular;  Laterality: N/A;   TEMPORARY PACEMAKER N/A 11/21/2016   Procedure: Temporary Pacemaker;  Surgeon: Marykay Lex, MD;  Location: [facility] INVASIVE CV  LAB;  Service: Cardiovascular;  Laterality: N/A;   TONSILLECTOMY     TOTAL KNEE ARTHROPLASTY Left 09/22/2012   Procedure: TOTAL KNEE ARTHROPLASTY;  Surgeon: Nilda Simmer, MD;  Location: [facility] OR;  Service: Orthopedics;  Laterality: Left;    Current Outpatient Medications  Medication Sig Dispense Refill   allopurinol (ZYLOPRIM) 300 MG tablet TAKE 1 TABLET DAILY 90 tablet 3   atorvastatin (LIPITOR) 40 MG tablet TAKE 1 TABLET DAILY 90 tablet 3   diphenhydramine-acetaminophen (TYLENOL PM) 25-500 MG TABS tablet Take 1 tablet by mouth at bedtime as needed (sleep).     doxycycline (ADOXA) 100 MG tablet Take 100 mg by mouth 2 (two) times daily.     ELIQUIS 2.5 MG TABS tablet TAKE 1 TABLET TWICE A DAY 180 tablet 3   finasteride (PROSCAR) 5 MG tablet Take 5 mg by mouth daily.     furosemide (LASIX) 20 MG tablet Take 1 tablet (20 mg  total) by mouth 2 (two) times daily. 180 tablet 3   metoprolol succinate (TOPROL-XL) 50 MG 24 hr tablet Take 1 tablet (50 mg total) by mouth daily. Take with or immediately following a meal. 90 tablet 3   nitroGLYCERIN (NITROSTAT) 0.4 MG SL tablet Place 1 tablet (0.4 mg total) under the tongue every 5 (five) minutes as needed for chest pain. 25 tablet 11   Tamsulosin HCl (FLOMAX) 0.4 MG CAPS Take 0.4 mg by mouth at bedtime.      No current facility-administered medications for this visit.    Allergies:   Epinephrine, Penicillins, Lisinopril, and Novocain [procaine]   Social History:  The patient  reports that he has never smoked. He has never used smokeless tobacco. He reports that he does not currently use alcohol. He reports that he does not use drugs.   Family History:  The patient's family history includes Breast cancer in his mother; Heart attack in his father.  ROS:  Please see the history of present illness.    All other systems are reviewed and otherwise negative.   PHYSICAL EXAM:  VS:  There were no vitals taken for this visit. BMI: There is no height or  weight on file to calculate BMI. Well nourished, well developed, in no acute distress HEENT: normocephalic, atraumatic Neck: no JVD, carotid bruits or masses Cardiac:  *** RRR; 1-2/6 SM, no rubs, or gallops Lungs:  *** CTA b/l, no wheezing, rhonchi or rales Abd: soft, nontender MS: no deformity or atrophy Ext: *** 1++ LLE, trace on R, chronic looking skin changes Skin: warm and dry, no rash Neuro:  No gross deficits appreciated Psych: euthymic mood, full affect  *** PPM site is stable, no tethering or discomfort   EKG:  not done today  Device interrogation done today and reviewed by myself:  *** Battery and lead measurements are good ***   01/20/2020: TTE IMPRESSIONS   1. Left ventricular ejection fraction, by estimation, is 60 to 65%. The  left ventricle has normal function. The left ventricle has no regional  wall motion abnormalities. Left ventricular diastolic parameters are  consistent with Grade I diastolic  dysfunction (impaired relaxation). The average left ventricular global  longitudinal strain is -17.7 %.   2. Right ventricular systolic function is normal. The right ventricular  size is normal. There is moderately elevated pulmonary artery systolic  pressure. The estimated right ventricular systolic pressure is 46.3 mmHg.   3. The mitral valve is normal in structure. Mild mitral valve  regurgitation. No evidence of mitral stenosis.   4. Tricuspid valve regurgitation is moderate.   5. The aortic valve is normal in structure. Aortic valve regurgitation is  mild. Moderate aortic valve stenosis. Aortic valve mean gradient measures  20.0 mmHg. Aortic valve Vmax measures 2.95 m/s.   6. Pulmonic valve regurgitation is moderate.   7. The inferior vena cava is normal in size with greater than 50%  respiratory variability, suggesting right atrial pressure of 3 mmHg.   Comparison(s): 02/03/19 EF 60-65%. Moderate AS mean PG, peak  PG. When compared to prior,  mild increase in aortic stenosis. Repeat ECHO  in one year.     05/22/2019: [facility] Conclusions: Significant single-vessel coronary artery disease with 60% in-stent restenosis of proximal RCA stent that is mildly abnormal by hemodynamic assessment (DFR 0.89; abnormal if less than 0.90). Mild to moderate, non-obstructive coronary artery disease involving the LAD and LCx; LAD stent is widely patent. Mildly elevated left heart, right heart,  and pulmonary artery pressures. Mild to moderate aortic valve stenosis. Mildly reduced Fick cardiac output/index.   Recommendations: Optimize medical therapy, including aggressive antianginal therapy.  We will add isosorbide mononitrate 30 mg daily, to be escalated as tolerated. If symptoms persist/worsen despite maximal tolerated doses of at least two antianginal agents, PCI to proximal RCA in-stent restenosis will need to be considered. Aggressive secondary prevention. Restart apixaban tomorrow morning if no evidence of bleeding or vascular injury at catheterization sites.    Recent Labs: 05/22/2023: ALT 24; BUN 32; Creatinine, Ser 1.98; Hemoglobin 13.4; Magnesium 2.1; Platelets 180; Potassium 4.3; Sodium 143; TSH 1.460  No results found for requested labs within last 365 days.   CrCl cannot be calculated (Patient's most recent lab result is older than the maximum 21 days allowed.).   Wt Readings from Last 3 Encounters:  06/06/23 226 lb 9.6 oz (102.8 kg)  05/22/23 233 lb 12.8 oz (106.1 kg)  03/25/23 221 lb (100.2 kg)     Other studies reviewed: Additional studies/records reviewed today include: summarized above  ASSESSMENT AND PLAN:  1. PPM     *** Intact function     *** No programming changes made  2. CAD     *** No ischemic symptoms     *** On BB, ranexa, statin     C/w Dr. Lalla Brothers  3. HTN     *** No changes   4. VHD      Mod AS     ***  5. NSVT     Known for him  6. PAFib 7. DVT hx (2018, recurrent in 11/2020 while off  OAC)     Feb 2023 eliquis dose reduced for chronic management of AFib     *** % burden     CHA2DS2Vasc is 4, on Eliquis , *** appropriately dosed for age/creat   7. DOE Perhaps a slow up-tick, but not new for years ***  Disposition: ***   Current medicines are reviewed at length with the patient today.  The patient did not have any concerns regarding medicines.  Norma Fredrickson, PA-C 06/16/2023 9:44 AM     [facility] [address] [place] [address] [phone] (office)  [phone] (fax)

## 2023-06-18 ENCOUNTER — Encounter (HOSPITAL_BASED_OUTPATIENT_CLINIC_OR_DEPARTMENT_OTHER): Payer: Medicare Other | Attending: General Surgery | Admitting: General Surgery

## 2023-06-18 DIAGNOSIS — L97828 Non-pressure chronic ulcer of other part of left lower leg with other specified severity: Secondary | ICD-10-CM | POA: Diagnosis not present

## 2023-06-18 DIAGNOSIS — Z7901 Long term (current) use of anticoagulants: Secondary | ICD-10-CM | POA: Insufficient documentation

## 2023-06-18 DIAGNOSIS — S8012XA Contusion of left lower leg, initial encounter: Secondary | ICD-10-CM | POA: Diagnosis not present

## 2023-06-18 DIAGNOSIS — I495 Sick sinus syndrome: Secondary | ICD-10-CM | POA: Diagnosis not present

## 2023-06-18 DIAGNOSIS — I130 Hypertensive heart and chronic kidney disease with heart failure and stage 1 through stage 4 chronic kidney disease, or unspecified chronic kidney disease: Secondary | ICD-10-CM | POA: Insufficient documentation

## 2023-06-18 DIAGNOSIS — I350 Nonrheumatic aortic (valve) stenosis: Secondary | ICD-10-CM | POA: Diagnosis not present

## 2023-06-18 DIAGNOSIS — I4891 Unspecified atrial fibrillation: Secondary | ICD-10-CM | POA: Insufficient documentation

## 2023-06-18 DIAGNOSIS — N189 Chronic kidney disease, unspecified: Secondary | ICD-10-CM | POA: Insufficient documentation

## 2023-06-18 DIAGNOSIS — I2510 Atherosclerotic heart disease of native coronary artery without angina pectoris: Secondary | ICD-10-CM | POA: Insufficient documentation

## 2023-06-18 DIAGNOSIS — I272 Pulmonary hypertension, unspecified: Secondary | ICD-10-CM | POA: Diagnosis not present

## 2023-06-18 DIAGNOSIS — N400 Enlarged prostate without lower urinary tract symptoms: Secondary | ICD-10-CM | POA: Diagnosis not present

## 2023-06-18 DIAGNOSIS — L03116 Cellulitis of left lower limb: Secondary | ICD-10-CM | POA: Insufficient documentation

## 2023-06-18 DIAGNOSIS — I509 Heart failure, unspecified: Secondary | ICD-10-CM | POA: Insufficient documentation

## 2023-06-18 DIAGNOSIS — T45515A Adverse effect of anticoagulants, initial encounter: Secondary | ICD-10-CM | POA: Diagnosis not present

## 2023-06-18 DIAGNOSIS — I252 Old myocardial infarction: Secondary | ICD-10-CM | POA: Diagnosis not present

## 2023-06-18 DIAGNOSIS — S81802A Unspecified open wound, left lower leg, initial encounter: Secondary | ICD-10-CM | POA: Diagnosis not present

## 2023-06-18 DIAGNOSIS — X58XXXA Exposure to other specified factors, initial encounter: Secondary | ICD-10-CM | POA: Diagnosis not present

## 2023-06-18 NOTE — Progress Notes (Signed)
[name], [name] ([id number]) [id number] Page 1 of 8 Visit Report for 06/18/2023 Chief Complaint Document Details Patient Name: Date of Service: [name] [name]. 06/18/2023 11:15 A M Medical Record Number: [id number] Patient Account Number: [id number] Date of Birth/Sex: Treating RN: 20-Nov-1933 (89 y.o. M) Primary Care Provider: Tana Conch Other Clinician: Referring Provider: Treating Provider/Extender: Arvin Collard in Treatment: 3 Information Obtained from: Patient Chief Complaint 05/27/2023; patient comes in today with a traumatic wound on the left anterior lower leg Electronic Signature(s) Signed: 06/18/2023 11:48:10 AM By: [name] Entered By: Duanne Guess on 06/18/2023 11:48:10 -------------------------------------------------------------------------------- HPI Details Patient Name: Date of Service: [name], Richard V [name]. 06/18/2023 11:15 A M Medical Record Number: [id number] Patient Account Number: [id number] Date of Birth/Sex: Treating RN: June 28, 1934 (89 y.o. M) Primary Care Provider: Tana Conch Other Clinician: Referring Provider: Treating Provider/Extender: Arvin Collard in Treatment: 3 History of Present Illness HPI Description: ADMISSION 05/27/2023; The patient is a 89 year old man who traumatized his left leg on a bed frame in [place] on . The wound was initially repaired in the ER with 17 sutures. He has been seen twice in urgent care on 10/8. He was given mupirocin and doxycycline. He was again seen in urgent care on 10/10 felt to have cellulitis and had Cipro added to the doxycycline which she is still taking. The patient is on Eliquis for history of sick sinus syndrome and atrial fibrillation. The patient is not complaining of a lot of pain. He is able to show me pictures of progressive skin necrosis just lateral to the original wound. Past medical history  includes sick sinus syndrome, coronary artery disease, aortic stenosis, chronic kidney disease. He is on Eliquis ABI in our clinic at 1.04 on the right and 1.06 on the left 06/03/2023: The cellulitis is still fairly persistent. There are multiple sutures still intact but they are pulling through the soft tissue. There is an odor coming from the wound. 06/11/2023: The wound is much cleaner this week. There is no longer any odor. There is still a fair amount of slough present with eschar around the edges. The culture that I took last week returned with E. coli and staph species, sensitive to the Levaquin I had prescribed. He is currently taking this. 06/18/2023: The wound is very clean this week but the granulation tissue is quite hypertrophic. There is no odor. Edema control is good. The wound VAC is being delivered today. Electronic Signature(s) Signed: 06/18/2023 11:48:55 AM By: [name] Entered By: Duanne Guess on 06/18/2023 11:48:54 [name], [name] ([id number]) [id number] Page 2 of 8 -------------------------------------------------------------------------------- Chemical Cauterization Details Patient Name: Date of Service: [name] [name]. 06/18/2023 11:15 A M Medical Record Number: [id number] Patient Account Number: [id number] Date of Birth/Sex: Treating RN: Jul 23, 1934 (89 y.o. [name] Primary Care Provider: Tana Conch Other Clinician: Referring Provider: Treating Provider/Extender: Arvin Collard in Treatment: 3 Procedure Performed for: Wound #1 Left,Lateral Lower Leg Performed By: Physician Duanne Guess, MD The following information was scribed by: [name] The information was scribed for: Duanne Guess Post Procedure Diagnosis Same as Pre-procedure Notes using silver nitrate sticks Electronic Signature(s) Signed: 06/18/2023 11:56:54 AM By: [name] Signed:  06/18/2023 5:09:47 PM By: [name] RN, BSN Entered By: [name] on 06/18/2023 11:39:12 -------------------------------------------------------------------------------- Physical Exam Details Patient Name: Date of Service: [name], Richard V [name]. 06/18/2023 11:15 A M  Medical Record Number: [id number] Patient Account Number: [id number] Date of Birth/Sex: Treating RN: 02/15/1934 (89 y.o. M) Primary Care Provider: Tana Conch Other Clinician: Referring Provider: Treating Provider/Extender: Arvin Collard in Treatment: 3 Constitutional . . . . no acute distress. Respiratory Normal work of breathing on room air.. Notes 06/18/2023: The wound is very clean this week but the granulation tissue is quite hypertrophic. There is no odor. Edema control is good. Electronic Signature(s) Signed: 06/18/2023 11:49:27 AM By: [name] Entered By: Duanne Guess on 06/18/2023 11:49:27 -------------------------------------------------------------------------------- Physician Orders Details Patient Name: Date of Service: [name], Richard V [name]. 06/18/2023 11:15 A M Medical Record Number: [id number] Patient Account Number: [id number] [name], [name] ([id number]) [id number] Page 3 of 8 Date of Birth/Sex: Treating RN: Sep 01, 1933 (89 y.o. [name] Primary Care Provider: Other Clinician: Tana Conch Referring Provider: Treating Provider/Extender: Arvin Collard in Treatment: 3 The following information was scribed by: [name] The information was scribed for: Duanne Guess Verbal / Phone Orders: No Diagnosis Coding ICD-10 Coding Code Description [phone] Non-pressure chronic ulcer of other part of left lower leg with other specified severity S80.12XD Contusion of left lower leg, subsequent encounter L03.116 Cellulitis of left lower limb T45.515S Adverse effect of anticoagulants,  sequela Follow-up Appointments ppointment in 1 week. - Dr. Lady Gary - RM 3 Return A Tues 11/12 @ 11:00 am Nurse Visit: - Friday 11/8 @ 09:30 am for [facility] placement Anesthetic (In clinic) Topical Lidocaine 4% applied to wound bed Bathing/ Shower/ Hygiene May shower with protection but do not get wound dressing(s) wet. Protect dressing(s) with water repellant cover (for example, large plastic bag) or a cast cover and may then take shower. Negative Presssure Wound Therapy Wound Vac to wound continuously at 185mm/hg pressure - apply at nurse visit 11/8 peel and place dressing Edema Control - Orders / Instructions Left Lower Extremity Elevate legs to the level of the heart or above for 30 minutes daily and/or when sitting for 3-4 times a day throughout the day. Avoid standing for long periods of time. Exercise regularly Lymphedema Treatment Plan - Exercise, Compression and Elevation Exercise daily as tolerated. (Walking, ROM, Calf Pumps and Toe Taps) Compression Wraps as ordered Elevate legs 30 - 60 minutes at or above heart level at least 3 - 4 times daily as able/tolerated Avoid standing for long periods and elevate leg(s) parallel to the floor when sitting Wound Treatment Wound #1 - Lower Leg Wound Laterality: Left, Lateral Cleanser: Soap and Water 1 x Per Week/30 Days Discharge Instructions: May shower and wash wound with dial antibacterial soap and water prior to dressing change. Cleanser: Wound Cleanser 1 x Per Week/30 Days Discharge Instructions: Cleanse the wound with wound cleanser prior to applying a clean dressing using gauze sponges, not tissue or cotton balls. Peri-Wound Care: Sween Lotion (Moisturizing lotion) 1 x Per Week/30 Days Discharge Instructions: Apply moisturizing lotion as directed Prim Dressing: Hydrofera Blue Ready Transfer Foam, 4x5 (in/in) 1 x Per Week/30 Days ary Discharge Instructions: Apply to wound bed as instructed Secondary Dressing: ABD Pad, 8x10 1 x Per  Week/30 Days Discharge Instructions: Apply over primary dressing as directed. Secondary Dressing: Zetuvit Plus 4x8 in 1 x Per Week/30 Days Discharge Instructions: Apply over primary dressing as directed. Compression Wrap: Urgo K2 Lite, (equivalent to a 3 layer) two layer compression system, regular 1 x Per Week/30 Days Discharge Instructions: Apply Urgo K2 Lite as directed (alternative to 3 layer compression). Electronic Signature(s) Signed:  06/18/2023 11:56:54 AM By: [name] Entered By: Duanne Guess on 06/18/2023 11:49:50 [place], [name] ([id number]) [id number] Page 4 of 8 -------------------------------------------------------------------------------- Problem List Details Patient Name: Date of Service: [name] [name]. 06/18/2023 11:15 A M Medical Record Number: [id number] Patient Account Number: [id number] Date of Birth/Sex: Treating RN: Dec 17, 1933 (89 y.o. [name] Primary Care Provider: Tana Conch Other Clinician: Referring Provider: Treating Provider/Extender: Arvin Collard in Treatment: 3 Active Problems ICD-10 Encounter Code Description Active Date MDM Diagnosis L97.828 Non-pressure chronic ulcer of other part of left lower leg with other specified 05/27/2023 No Yes severity S80.12XD Contusion of left lower leg, subsequent encounter 05/27/2023 No Yes L03.116 Cellulitis of left lower limb 05/27/2023 No Yes T45.515S Adverse effect of anticoagulants, sequela 05/27/2023 No Yes Inactive Problems Resolved Problems Electronic Signature(s) Signed: 06/18/2023 11:36:19 AM By: [name] Entered By: Duanne Guess on 06/18/2023 11:36:19 -------------------------------------------------------------------------------- Progress Note Details Patient Name: Date of Service: [name], Richard V [name]. 06/18/2023 11:15 A M Medical Record Number: [id number] Patient Account Number:  [id number] Date of Birth/Sex: Treating RN: May 05, 1934 (89 y.o. M) Primary Care Provider: Tana Conch Other Clinician: Referring Provider: Treating Provider/Extender: Arvin Collard in Treatment: 3 Subjective Chief Complaint Information obtained from Patient 05/27/2023; patient comes in today with a traumatic wound on the left anterior lower leg History of Present Illness (HPI) ADMISSION 05/27/2023; The patient is a 89 year old man who traumatized his left leg on a bed frame in [place] on . The wound was initially repaired in the ER with 17 sutures. TERRAL, COOKS ([id number]) [id number] Page 5 of 8 He has been seen twice in urgent care on 10/8. He was given mupirocin and doxycycline. He was again seen in urgent care on 10/10 felt to have cellulitis and had Cipro added to the doxycycline which she is still taking. The patient is on Eliquis for history of sick sinus syndrome and atrial fibrillation. The patient is not complaining of a lot of pain. He is able to show me pictures of progressive skin necrosis just lateral to the original wound. Past medical history includes sick sinus syndrome, coronary artery disease, aortic stenosis, chronic kidney disease. He is on Eliquis ABI in our clinic at 1.04 on the right and 1.06 on the left 06/03/2023: The cellulitis is still fairly persistent. There are multiple sutures still intact but they are pulling through the soft tissue. There is an odor coming from the wound. 06/11/2023: The wound is much cleaner this week. There is no longer any odor. There is still a fair amount of slough present with eschar around the edges. The culture that I took last week returned with E. coli and staph species, sensitive to the Levaquin I had prescribed. He is currently taking this. 06/18/2023: The wound is very clean this week but the granulation tissue is quite hypertrophic. There is no odor. Edema control  is good. The wound VAC is being delivered today. Patient History Information obtained from Chart. Family History Cancer - Mother, Heart Disease - Father. Social History Never smoker, Alcohol Use - Never, Drug Use - No History. Medical History Cardiovascular Patient has history of Arrhythmia - a fib, Congestive Heart Failure, Coronary Artery Disease, Hypertension, Myocardial Infarction Hospitalization/Surgery History - cardiac cath. - cardiac angiogram. - angioplasty. - joint replacement. - knee arthroscopy. - tonsillectomy. - lap chole. - cataract surgery. - pilonidal cystectomy. Medical A Surgical History Notes nd Respiratory Pulmonary Hypertension Genitourinary BPH,  CKD Objective Constitutional no acute distress. Vitals Time Taken: 11:10 AM, Height: 75 in, Weight: 238 lbs, BMI: 29.7, Temperature: 97.6 F, Pulse: 89 bpm, Respiratory Rate: 18 breaths/min, Blood Pressure: 100/56 mmHg. Respiratory Normal work of breathing on room air.. General Notes: 06/18/2023: The wound is very clean this week but the granulation tissue is quite hypertrophic. There is no odor. Edema control is good. Integumentary (Hair, Skin) Wound #1 status is Open. Original cause of wound was Trauma. The date acquired was: 05/14/2023. The wound has been in treatment 3 weeks. The wound is located on the Left,Lateral Lower Leg. The wound measures 11.5cm length x 4cm width x 0.5cm depth; 36.128cm^2 area and 18.064cm^3 volume. There is Fat Layer (Subcutaneous Tissue) exposed. There is no tunneling or undermining noted. There is a large amount of serosanguineous drainage noted. The wound margin is distinct with the outline attached to the wound base. There is large (67-100%) red, hyper - granulation within the wound bed. There is a small (1-33%) amount of necrotic tissue within the wound bed including Adherent Slough. The periwound skin appearance had no abnormalities noted for texture. The periwound skin appearance had no  abnormalities noted for moisture. The periwound skin appearance exhibited: Hemosiderin Staining. The periwound skin appearance did not exhibit: Rubor. Periwound temperature was noted as No Abnormality. The periwound has tenderness on palpation. Assessment Active Problems ICD-10 Non-pressure chronic ulcer of other part of left lower leg with other specified severity Contusion of left lower leg, subsequent encounter Cellulitis of left lower limb Adverse effect of anticoagulants, sequela [name], [name] ([id number]) [id number] Page 6 of 8 Procedures Wound #1 Pre-procedure diagnosis of Wound #1 is a Trauma, Other located on the Left,Lateral Lower Leg . There was a Double Layer Compression Therapy Procedure by [name], RN. Post procedure Diagnosis Wound #1: Same as Pre-Procedure Notes: urgo lite. Pre-procedure diagnosis of Wound #1 is a Trauma, Other located on the Left,Lateral Lower Leg . An Chemical Cauterization procedure was performed by Duanne Guess, MD. Post procedure Diagnosis Wound #1: Same as Pre-Procedure Notes: using silver nitrate sticks Plan Follow-up Appointments: Return Appointment in 1 week. - Dr. Lady Gary - RM 3 Tues 11/12 @ 11:00 am Nurse Visit: - Friday 11/8 @ 09:30 am for [facility] placement Anesthetic: (In clinic) Topical Lidocaine 4% applied to wound bed Bathing/ Shower/ Hygiene: May shower with protection but do not get wound dressing(s) wet. Protect dressing(s) with water repellant cover (for example, large plastic bag) or a cast cover and may then take shower. Negative Presssure Wound Therapy: Wound Vac to wound continuously at 14mm/hg pressure - apply at nurse visit 11/8 peel and place dressing Edema Control - Orders / Instructions: Elevate legs to the level of the heart or above for 30 minutes daily and/or when sitting for 3-4 times a day throughout the day. Avoid standing for long periods of time. Exercise regularly Lymphedema  Treatment Plan - Exercise, Compression and Elevation: Exercise daily as tolerated. (Walking, ROM, Calf Pumps and T T oe aps) Compression Wraps as ordered Elevate legs 30 - 60 minutes at or above heart level at least 3 - 4 times daily as able/tolerated Avoid standing for long periods and elevate leg(s) parallel to the floor when sitting WOUND #1: - Lower Leg Wound Laterality: Left, Lateral Cleanser: Soap and Water 1 x Per Week/30 Days Discharge Instructions: May shower and wash wound with dial antibacterial soap and water prior to dressing change. Cleanser: Wound Cleanser 1 x Per Week/30 Days Discharge Instructions: Cleanse the wound  with wound cleanser prior to applying a clean dressing using gauze sponges, not tissue or cotton balls. Peri-Wound Care: Sween Lotion (Moisturizing lotion) 1 x Per Week/30 Days Discharge Instructions: Apply moisturizing lotion as directed Prim Dressing: Hydrofera Blue Ready Transfer Foam, 4x5 (in/in) 1 x Per Week/30 Days ary Discharge Instructions: Apply to wound bed as instructed Secondary Dressing: ABD Pad, 8x10 1 x Per Week/30 Days Discharge Instructions: Apply over primary dressing as directed. Secondary Dressing: Zetuvit Plus 4x8 in 1 x Per Week/30 Days Discharge Instructions: Apply over primary dressing as directed. Com pression Wrap: Urgo K2 Lite, (equivalent to a 3 layer) two layer compression system, regular 1 x Per Week/30 Days Discharge Instructions: Apply Urgo K2 Lite as directed (alternative to 3 layer compression). 06/18/2023: The wound is very clean this week but the granulation tissue is quite hypertrophic. There is no odor. Edema control is good. The wound VAC is being delivered today. No debridement was necessary today. I did chemically cauterize the hypertrophic granulation tissue using silver nitrate. We will change his contact layer to [facility]. Continue 3 layer compression/equivalent. We will set up a nurse visit for him on Friday to  have his wound VAC applied. Follow-up with me in a week. Electronic Signature(s) Signed: 06/18/2023 11:50:39 AM By: [name] Entered By: Duanne Guess on 06/18/2023 11:50:38 -------------------------------------------------------------------------------- HxROS Details Patient Name: Date of Service: [name], Richard V [name]. 06/18/2023 11:15 A M Medical Record Number: [id number] Patient Account Number: [id number] [name], [name] ([id number]) [id number] Page 7 of 8 Date of Birth/Sex: Treating RN: March 14, 1934 (89 y.o. M) Primary Care Provider: Other Clinician: Tana Conch Referring Provider: Treating Provider/Extender: Arvin Collard in Treatment: 3 Information Obtained From Chart Respiratory Medical History: Past Medical History Notes: Pulmonary Hypertension Cardiovascular Medical History: Positive for: Arrhythmia - a fib; Congestive Heart Failure; Coronary Artery Disease; Hypertension; Myocardial Infarction Genitourinary Medical History: Past Medical History Notes: BPH, CKD Immunizations Pneumococcal Vaccine: Received Pneumococcal Vaccination: No Implantable Devices No devices added Hospitalization / Surgery History Type of Hospitalization/Surgery cardiac cath cardiac angiogram angioplasty joint replacement knee arthroscopy tonsillectomy lap chole cataract surgery pilonidal cystectomy Family and Social History Cancer: Yes - Mother; Heart Disease: Yes - Father; Never smoker; Alcohol Use: Never; Drug Use: No History; Financial Concerns: No; Food, Clothing or Shelter Needs: No; Support System Lacking: No; Transportation Concerns: No Electronic Signature(s) Signed: 06/18/2023 11:56:54 AM By: [name] Entered By: Duanne Guess on 06/18/2023 11:49:00 -------------------------------------------------------------------------------- SuperBill Details Patient Name: Date of Service: [name],  Richard V [name]. 06/18/2023 Medical Record Number: [id number] Patient Account Number: [id number] Date of Birth/Sex: Treating RN: 1934/04/22 (89 y.o. M) Primary Care Provider: Tana Conch Other Clinician: Referring Provider: Treating Provider/Extender: Arvin Collard in Treatment: 3 Diagnosis Coding ICD-10 Codes Code Description [name], [name] ([id number]) [id number] Page 8 of 8 [phone] Non-pressure chronic ulcer of other part of left lower leg with other specified severity S80.12XD Contusion of left lower leg, subsequent encounter L03.116 Cellulitis of left lower limb T45.515S Adverse effect of anticoagulants, sequela Facility Procedures : CPT4 Code: 84132440 Description: 17250 - CHEM CAUT GRANULATION TISS ICD-10 Diagnosis Description L97.828 Non-pressure chronic ulcer of other part of left lower leg with other specified sever Modifier: ity Quantity: 1 : CPT4 Code: 10272536 Description: (Facility Use Only) 29581LT - APPLY MULTLAY COMPRS LWR LT LEG Modifier: Quantity: 1 Physician Procedures : CPT4 Code Description Modifier [id number] - WC PHYS LEVEL 3 - EST PT ICD-10 Diagnosis Description  Z61.096 Non-pressure chronic ulcer of other part of left lower leg with other specified severity S80.12XD Contusion of left lower leg, subsequent  encounter L03.116 Cellulitis of left lower limb T45.515S Adverse effect of anticoagulants, sequela Quantity: 1 : [id number] 17250 - WC PHYS CHEM CAUT GRAN TISSUE ICD-10 Diagnosis Description L97.828 Non-pressure chronic ulcer of other part of left lower leg with other specified severity Quantity: 1 Electronic Signature(s) Signed: 06/18/2023 12:25:29 PM By: [name] Signed: 06/18/2023 5:09:47 PM By: [name] RN, BSN Previous Signature: 06/18/2023 11:51:02 AM Version By: [name] Entered By: [name] on 06/18/2023 11:57:33

## 2023-06-18 NOTE — Progress Notes (Signed)
RIDDICK, NUON ([id number]) [id number] Page 1 of 8 Visit Report for 06/18/2023 Arrival Information Details Patient Name: Date of Service: [name] ID H. 06/18/2023 11:15 A M Medical Record Number: [id number] Patient Account Number: [id number] Date of Birth/Sex: Treating RN: 09/06/1933 (89 y.o. M) Primary Care Leandrew Keech: Tana Conch Other Clinician: Referring Tijuan Dantes: Treating Kalynne Womac/Extender: Arvin Collard in Treatment: 3 Visit Information History Since Last Visit Added or deleted any medications: No Patient Arrived: Ambulatory Any new allergies or adverse reactions: No Arrival Time: 11:10 Had a fall or experienced change in No Accompanied By: self activities of daily living that may affect Transfer Assistance: None risk of falls: Patient Identification Verified: Yes Signs or symptoms of abuse/neglect since last visito No Secondary Verification Process Completed: Yes Hospitalized since last visit: No Patient Requires Transmission-Based Precautions: No Implantable device outside of the clinic excluding No Patient Has Alerts: No cellular tissue based products placed in the center since last visit: Has Dressing in Place as Prescribed: Yes Has Compression in Place as Prescribed: Yes Pain Present Now: No Electronic Signature(s) Unsigned Previous Signature: 06/18/2023 11:15:05 AM Version By: Dayton Scrape Entered By: [name] on 06/18/2023 11:24:43 -------------------------------------------------------------------------------- Compression Therapy Details Patient Name: Date of Service: [name] ID H. 06/18/2023 11:15 A M Medical Record Number: [id number] Patient Account Number: [id number] Date of Birth/Sex: Treating RN: [birth year] (89 y.o. [name] Primary Care Florentino Laabs: Tana Conch Other Clinician: Referring Lisset Ketchem: Treating Ashrita Chrismer/Extender: Arvin Collard in  Treatment: 3 Compression Therapy Performed for Wound Assessment: Wound #1 Left,Lateral Lower Leg Performed By: Clinician [name], RN Compression Type: Double Layer Post Procedure Diagnosis Same as Pre-procedure Notes urgo lite Electronic Signature(s) Unsigned Entered ByZenaida Deed on 06/18/2023 11:38:22 Signature(s): Margerum, Yosef H ([id number]) [phone] Date(s): [id number] Page 2 of 8 -------------------------------------------------------------------------------- Encounter Discharge Information Details Patient Name: Date of Service: HA Asencion Noble 06/18/2023 11:15 A M Medical Record Number: [id number] Patient Account Number: [id number] Date of Birth/Sex: Treating RN: Nov 16, 1933 (89 y.o. [name] Primary Care Yvonnia Tango: Tana Conch Other Clinician: Referring Nichael Ehly: Treating Merlon Alcorta/Extender: Arvin Collard in Treatment: 3 Encounter Discharge Information Items Discharge Condition: Stable Ambulatory Status: Ambulatory Discharge Destination: Home Transportation: Private Auto Accompanied By: self Schedule Follow-up Appointment: Yes Clinical Summary of Care: Patient Declined Electronic Signature(s) Unsigned Entered By: [name] on 06/18/2023 11:58:03 -------------------------------------------------------------------------------- Lower Extremity Assessment Details Patient Name: Date of Service: HA Ellwood Dense ID H. 06/18/2023 11:15 A M Medical Record Number: [id number] Patient Account Number: [id number] Date of Birth/Sex: Treating RN: [birth year] (89 y.o. [name] Primary Care Sewell Pitner: Tana Conch Other Clinician: Referring Shakenna Herrero: Treating Una Yeomans/Extender: Arvin Collard in Treatment: 3 Edema Assessment Assessed: [Left: No] [Right: No] Edema: [Left: Ye] [Right: s] Calf Left: Right: Point of Measurement: From Medial Instep 38.4 cm Ankle Left:  Right: Point of Measurement: From Medial Instep 26.5 cm Vascular Assessment Pulses: Dorsalis Pedis Palpable: [Left:Yes] Extremity colors, hair growth, and conditions: Extremity Color: [Left:Hyperpigmented] Hair Growth on Extremity: [Left:No] Temperature of Extremity: [Left:Warm] Capillary Refill: [Left:< 3 seconds] Dependent Rubor: [Left:No] Lipodermatosclerosis: [Left:No] [Right:[id number] Page 3 of 8] Electronic Signature(s) Unsigned Entered By: [name] on 06/18/2023 11:30:09 -------------------------------------------------------------------------------- Multi Wound Chart Details Patient Name: Date of Service: HA Ellwood Dense ID H. 06/18/2023 11:15 A M Medical Record Number: [id number] Patient Account Number: [id number] Date of Birth/Sex: Treating RN: 1933/11/14 (89 y.o. M) Primary Care Denetta Fei: Tana Conch Other Clinician: Referring Jermaine Neuharth:  Treating Farris Blash/Extender: Arvin Collard in Treatment: 3 Vital Signs Height(in): 75 Pulse(bpm): 89 Weight(lbs): 238 Blood Pressure(mmHg): 100/56 Body Mass Index(BMI): 29.7 Temperature(F): 97.6 Respiratory Rate(breaths/min): 18 [1:Photos:] [N/A:N/A] Left, Lateral Lower Leg N/A N/A Wound Location: Trauma N/A N/A Wounding Event: Trauma, Other N/A N/A Primary Etiology: Arrhythmia, Congestive Heart Failure, N/A N/A Comorbid History: Coronary Artery Disease, Hypertension, Myocardial Infarction 05/14/2023 N/A N/A Date Acquired: 3 N/A N/A Weeks of Treatment: Open N/A N/A Wound Status: No N/A N/A Wound Recurrence: 11.5x4x0.5 N/A N/A Measurements L x W x D (cm) 36.128 N/A N/A A (cm) : rea 18.064 N/A N/A Volume (cm) : 35.30% N/A N/A % Reduction in Area: -61.80% N/A N/A % Reduction in Volume: Full Thickness Without Exposed N/A N/A Classification: Support Structures Large N/A N/A Exudate Amount: Serosanguineous N/A N/A Exudate Type: red, brown N/A  N/A Exudate Color: Distinct, outline attached N/A N/A Wound Margin: Large (67-100%) N/A N/A Granulation Amount: Red, Hyper-granulation N/A N/A Granulation Quality: Small (1-33%) N/A N/A Necrotic Amount: Fat Layer (Subcutaneous Tissue): Yes N/A N/A Exposed Structures: Fascia: No Tendon: No Muscle: No Joint: No Bone: No Small (1-33%) N/A N/A Epithelialization: Scarring: No N/A N/A Periwound Skin Texture: Maceration: No N/A N/A Periwound Skin Moisture: Dry/Scaly: No Hemosiderin Staining: Yes N/A N/A Periwound Skin Color: Rubor: No No Abnormality N/A N/A TemperatureJaser Fullen, Sherwin Rexene Edison ([id number]) [id number] Page 4 of 8 Yes N/A N/A Tenderness on Palpation: Chemical Cauterization N/A N/A Procedures Performed: Compression Therapy Treatment Notes Electronic Signature(s) Signed: 06/18/2023 11:48:05 AM By: [name] Entered By: Duanne Guess on 06/18/2023 11:48:05 -------------------------------------------------------------------------------- Multi-Disciplinary Care Plan Details Patient Name: Date of Service: [name], [name] 06/18/2023 11:15 A M Medical Record Number: [id number] Patient Account Number: [id number] Date of Birth/Sex: Treating RN: August 25, 1933 (89 y.o. [name] Primary Care Malajah Oceguera: Tana Conch Other Clinician: Referring Ostin Mathey: Treating Edla Para/Extender: Arvin Collard in Treatment: 3 Multidisciplinary Care Plan reviewed with physician Active Inactive Venous Leg Ulcer Nursing Diagnoses: Knowledge deficit related to disease process and management Potential for venous Insuffiency (use before diagnosis confirmed) Goals: Patient will maintain optimal edema control Date Initiated: 06/18/2023 Target Resolution Date: 07/16/2023 Goal Status: Active Interventions: Assess peripheral edema status every visit. Compression as ordered Treatment Activities: Therapeutic compression  applied : 06/18/2023 Notes: Wound/Skin Impairment Nursing Diagnoses: Knowledge deficit related to smoking impact on wound healing Goals: Patient/caregiver will verbalize understanding of skin care regimen Date Initiated: 05/27/2023 Target Resolution Date: 07/13/2023 Goal Status: Active Interventions: Assess patient/caregiver ability to obtain necessary supplies Assess patient/caregiver ability to perform ulcer/skin care regimen upon admission and as needed Assess ulceration(s) every visit Provide education on ulcer and skin care Screen for HBO Treatment Activities: Skin care regimen initiated : 05/27/2023 Topical wound management initiated : 05/27/2023 Notes: CAVON, NICOLLS ([id number]) [phone].pdf Page 5 of 8 Electronic Signature(s) Unsigned Entered By: [name] on 06/18/2023 11:34:12 -------------------------------------------------------------------------------- Pain Assessment Details Patient Name: Date of Service: [name] ID H. 06/18/2023 11:15 A M Medical Record Number: [id number] Patient Account Number: [id number] Date of Birth/Sex: Treating RN: [birth year] (89 y.o. M) Primary Care Abilene Mcphee: Tana Conch Other Clinician: Referring Bebe Moncure: Treating Ayo Smoak/Extender: Arvin Collard in Treatment: 3 Active Problems Location of Pain Severity and Description of Pain Patient Has Paino No Site Locations Rate the pain. Current Pain Level: 0 Pain Management and Medication Current Pain Management: Electronic Signature(s) Unsigned Previous Signature: 06/18/2023 11:15:05 AM Version By: Dayton Scrape Entered By: [name] on 06/18/2023 11:25:19 --------------------------------------------------------------------------------  Patient/Caregiver Education Details Patient Name: Date of Service: HA Asencion Noble 11/5/2024andnbsp11:15 A M Medical Record Number: [id number] Patient Account Number:  [id number] Date of Birth/Gender: Treating RN: Jan 06, 1934 (89 y.o. [name] Primary Care Physician: Tana Conch Other Clinician: Referring Physician: Treating Physician/Extender: Arvin Collard in Treatment: 3 Education Assessment [place], Keri H ([id number]) [id number] Page 6 of 8 Education Provided To: Patient Education Topics Provided Venous: Methods: Explain/Verbal Responses: Reinforcements needed, State content correctly Wound/Skin Impairment: Methods: Explain/Verbal Responses: Reinforcements needed, State content correctly Electronic Signature(s) Unsigned Entered By: [name] on 06/18/2023 11:34:35 -------------------------------------------------------------------------------- Wound Assessment Details Patient Name: Date of Service: HA Ellwood Dense ID H. 06/18/2023 11:15 A M Medical Record Number: [id number] Patient Account Number: [id number] Date of Birth/Sex: Treating RN: 1934-03-27 (89 y.o. [name] Primary Care Jarrad Mclees: Tana Conch Other Clinician: Referring Marlo Arriola: Treating Kavir Savoca/Extender: Arvin Collard in Treatment: 3 Wound Status Wound Number: 1 Primary Trauma, Other Etiology: Wound Location: Left, Lateral Lower Leg Wound Open Wounding Event: Trauma Status: Date Acquired: 05/14/2023 Comorbid Arrhythmia, Congestive Heart Failure, Coronary Artery Disease, Weeks Of Treatment: 3 History: Hypertension, Myocardial Infarction Clustered Wound: No Photos Wound Measurements Length: (cm) 11.5 Width: (cm) 4 Depth: (cm) 0.5 Area: (cm) 36.128 Volume: (cm) 18.064 % Reduction in Area: 35.3% % Reduction in Volume: -61.8% Epithelialization: Small (1-33%) Tunneling: No Undermining: No Wound Description Classification: Full Thickness Without Exposed Support Wound Margin: Distinct, outline attached Exudate Amount: Large Exudate Type: Serosanguineous Exudate  Color: red, brown Frampton, Shamond H ([id number]) Wound Bed Granulation Amount: Large (67-100%) Granulation Quality: Red, Hyper-granul Necrotic Amount: Small (1-33%) Necrotic Quality: Adherent Slough Structures Foul Odor After Cleansing: No Slough/Fibrino Yes [phone].pdf Page 7 of 8 Exposed Structure ation Fascia Exposed: No Fat Layer (Subcutaneous Tissue) Exposed: Yes Tendon Exposed: No Muscle Exposed: No Joint Exposed: No Bone Exposed: No Periwound Skin Texture Texture Color No Abnormalities Noted: Yes No Abnormalities Noted: No Hemosiderin Staining: Yes Moisture Rubor: No No Abnormalities Noted: Yes Temperature / Pain Temperature: No Abnormality Tenderness on Palpation: Yes Treatment Notes Wound #1 (Lower Leg) Wound Laterality: Left, Lateral Cleanser Soap and Water Discharge Instruction: May shower and wash wound with dial antibacterial soap and water prior to dressing change. Wound Cleanser Discharge Instruction: Cleanse the wound with wound cleanser prior to applying a clean dressing using gauze sponges, not tissue or cotton balls. Peri-Wound Care Sween Lotion (Moisturizing lotion) Discharge Instruction: Apply moisturizing lotion as directed Topical Primary Dressing Hydrofera Blue Ready Transfer Foam, 4x5 (in/in) Discharge Instruction: Apply to wound bed as instructed Secondary Dressing ABD Pad, 8x10 Discharge Instruction: Apply over primary dressing as directed. Zetuvit Plus 4x8 in Discharge Instruction: Apply over primary dressing as directed. Secured With Compression Wrap Urgo K2 Lite, (equivalent to a 3 layer) two layer compression system, regular Discharge Instruction: Apply Urgo K2 Lite as directed (alternative to 3 layer compression). Compression Stockings Add-Ons Electronic Signature(s) Unsigned Previous Signature: 06/18/2023 11:20:21 AM Version By: Dayton Scrape Previous Signature: 06/18/2023 11:15:05 AM Version By: Dayton Scrape Entered By: [name] on 06/18/2023 11:33:26 -------------------------------------------------------------------------------- Vitals Details Patient Name: Date of Service: [name], [name] 06/18/2023 11:15 A M Medical Record Number: [id number] Patient Account Number: [id number] Date of Birth/Sex: Treating RN: Jan 16, 1934 (89 y.o. [name], Oryn H ([id number]) [id number] Page 8 of 8 Primary Care Orian Amberg: Tana Conch Other Clinician: Referring Eryk Beavers: Treating Rosslyn Pasion/Extender: Arvin Collard in Treatment: 3 Vital Signs Time Taken: 11:10 Temperature (F): 97.6 Height (in):  75 Pulse (bpm): 89 Weight (lbs): 238 Respiratory Rate (breaths/min): 18 Body Mass Index (BMI): 29.7 Blood Pressure (mmHg): 100/56 Reference Range: 80 - 120 mg / dl Electronic Signature(s) Unsigned Previous Signature: 06/18/2023 11:15:05 AM Version By: Dayton Scrape Entered By: [name] on 06/18/2023 11:24:49 Signature(s): Date(s):

## 2023-06-19 ENCOUNTER — Ambulatory Visit: Payer: Medicare Other | Attending: Physician Assistant | Admitting: Physician Assistant

## 2023-06-19 ENCOUNTER — Encounter: Payer: Self-pay | Admitting: Physician Assistant

## 2023-06-19 VITALS — BP 110/68 | HR 85 | Ht 75.0 in | Wt 230.0 lb

## 2023-06-19 DIAGNOSIS — I480 Paroxysmal atrial fibrillation: Secondary | ICD-10-CM | POA: Insufficient documentation

## 2023-06-19 DIAGNOSIS — I10 Essential (primary) hypertension: Secondary | ICD-10-CM | POA: Insufficient documentation

## 2023-06-19 DIAGNOSIS — Z950 Presence of cardiac pacemaker: Secondary | ICD-10-CM | POA: Diagnosis not present

## 2023-06-19 DIAGNOSIS — I2510 Atherosclerotic heart disease of native coronary artery without angina pectoris: Secondary | ICD-10-CM | POA: Insufficient documentation

## 2023-06-19 LAB — CUP PACEART INCLINIC DEVICE CHECK
Battery Remaining Longevity: 49 mo
Battery Voltage: 2.88 V
Brady Statistic AP VP Percent: 86.67 %
Brady Statistic AP VS Percent: 13.06 %
Brady Statistic AS VP Percent: 0.1 %
Brady Statistic AS VS Percent: 0.16 %
Brady Statistic RA Percent Paced: 99.84 %
Brady Statistic RV Percent Paced: 86.77 %
Date Time Interrogation Session: 20241106104720
Implantable Lead Connection Status: 753985
Implantable Lead Connection Status: 753985
Implantable Lead Implant Date: 20190116
Implantable Lead Implant Date: 20190116
Implantable Lead Location: 753859
Implantable Lead Location: 753860
Implantable Lead Model: 5076
Implantable Lead Model: 5076
Implantable Pulse Generator Implant Date: 20190116
Lead Channel Impedance Value: 304 Ohm
Lead Channel Impedance Value: 323 Ohm
Lead Channel Impedance Value: 361 Ohm
Lead Channel Impedance Value: 437 Ohm
Lead Channel Pacing Threshold Amplitude: 1.125 V
Lead Channel Pacing Threshold Amplitude: 1.25 V
Lead Channel Pacing Threshold Pulse Width: 0.4 ms
Lead Channel Pacing Threshold Pulse Width: 0.4 ms
Lead Channel Sensing Intrinsic Amplitude: 0.125 mV
Lead Channel Sensing Intrinsic Amplitude: 0.125 mV
Lead Channel Sensing Intrinsic Amplitude: 6.25 mV
Lead Channel Sensing Intrinsic Amplitude: 7.625 mV
Lead Channel Setting Pacing Amplitude: 2.25 V
Lead Channel Setting Pacing Amplitude: 2.5 V
Lead Channel Setting Pacing Pulse Width: 0.4 ms
Lead Channel Setting Sensing Sensitivity: 1.2 mV
Zone Setting Status: 755011
Zone Setting Status: 755011

## 2023-06-19 NOTE — Patient Instructions (Signed)
Medication Instructions:  Your physician recommends that you continue on your current medications as directed. Please refer to the Current Medication list given to you today. *If you need a refill on your cardiac medications before your next appointment, please call your pharmacy*   Follow-Up: At [facility], you and your health needs are our priority.  As part of our continuing mission to provide you with exceptional heart care, we have created designated Provider Care Teams.  These Care Teams include your primary Cardiologist (physician) and Advanced Practice Providers (APPs -  Physician Assistants and Nurse Practitioners) who all work together to provide you with the care you need, when you need it.  We recommend signing up for the patient portal called "MyChart".  Sign up information is provided on this After Visit Summary.  MyChart is used to connect with patients for Virtual Visits (Telemedicine).  Patients are able to view lab/test results, encounter notes, upcoming appointments, etc.  Non-urgent messages can be sent to your provider as well.   To learn more about what you can do with MyChart, go to [email or web address].    Your next appointment:   1 year(s)  Provider:   Loman Brooklyn, MD or Francis Dowse PA-C

## 2023-06-21 ENCOUNTER — Encounter (HOSPITAL_BASED_OUTPATIENT_CLINIC_OR_DEPARTMENT_OTHER): Payer: Medicare Other | Admitting: General Surgery

## 2023-06-21 DIAGNOSIS — I272 Pulmonary hypertension, unspecified: Secondary | ICD-10-CM | POA: Diagnosis not present

## 2023-06-21 DIAGNOSIS — L97828 Non-pressure chronic ulcer of other part of left lower leg with other specified severity: Secondary | ICD-10-CM | POA: Diagnosis not present

## 2023-06-21 DIAGNOSIS — I509 Heart failure, unspecified: Secondary | ICD-10-CM | POA: Diagnosis not present

## 2023-06-21 DIAGNOSIS — I495 Sick sinus syndrome: Secondary | ICD-10-CM | POA: Diagnosis not present

## 2023-06-21 DIAGNOSIS — I130 Hypertensive heart and chronic kidney disease with heart failure and stage 1 through stage 4 chronic kidney disease, or unspecified chronic kidney disease: Secondary | ICD-10-CM | POA: Diagnosis not present

## 2023-06-21 DIAGNOSIS — I4891 Unspecified atrial fibrillation: Secondary | ICD-10-CM | POA: Diagnosis not present

## 2023-06-21 NOTE — Progress Notes (Signed)
NADEN, DOOLEN ([id number]) [id number] Page 1 of 4 Visit Report for 06/21/2023 Arrival Information Details Patient Name: Date of Service: [name] [name]. 06/21/2023 9:30 A M Medical Record Number: [id number] Patient Account Number: [id number] Date of Birth/Sex: Treating RN: February 11, 1934 (89 y.o. M) Primary Care Taji Sather: Tana Conch Other Clinician: Referring [name]: Treating [name]/Extender: Arvin Collard in Treatment: 3 Visit Information History Since Last Visit Added or deleted any medications: No Patient Arrived: Ambulatory Any new allergies or adverse reactions: No Arrival Time: 09:14 Had a fall or experienced change in No Accompanied By: self activities of daily living that may affect Transfer Assistance: None risk of falls: Patient Identification Verified: Yes Signs or symptoms of abuse/neglect since last visito No Secondary Verification Process Completed: Yes Hospitalized since last visit: No Patient Requires Transmission-Based Precautions: No Implantable device outside of the clinic excluding No Patient Has Alerts: No cellular tissue based products placed in the center since last visit: Has Dressing in Place as Prescribed: Yes Has Compression in Place as Prescribed: Yes Pain Present Now: No Electronic Signature(s) Signed: 06/21/2023 12:52:25 PM By: Thayer Dallas Entered By: Thayer Dallas on 06/21/2023 09:44:08 -------------------------------------------------------------------------------- Encounter Discharge Information Details Patient Name: Date of Service: [name], Richard V [name]. 06/21/2023 9:30 A M Medical Record Number: [id number] Patient Account Number: [id number] Date of Birth/Sex: Treating RN: September 09, 1933 (89 y.o. M) Primary Care Alastor Kneale: Tana Conch Other Clinician: Thayer Dallas Referring [name]: Treating [name]/Extender: Arvin Collard in Treatment: 3 Encounter  Discharge Information Items Discharge Condition: Stable Ambulatory Status: Ambulatory Discharge Destination: Home Transportation: Private Auto Accompanied By: self Schedule Follow-up Appointment: Yes Clinical Summary of Care: Electronic Signature(s) Signed: 06/21/2023 12:52:25 PM By: Thayer Dallas Entered By: Thayer Dallas on 06/21/2023 09:47:07 [name], [name] ([id number]) [id number] Page 2 of 4 -------------------------------------------------------------------------------- Negative Pressure Wound Therapy Application (NPWT) Details Patient Name: Date of Service: [name] [name]. 06/21/2023 9:30 A M Medical Record Number: [id number] Patient Account Number: [id number] Date of Birth/Sex: Treating RN: Aug 06, 1934 (89 y.o. M) Primary Care Tadashi Burkel: Tana Conch Other Clinician: Referring [name]: Treating [name]/Extender: Arvin Collard in Treatment: 3 NPWT Application Performed for: Wound #1 Left, Lateral Lower Leg Performed By: Thayer Dallas, Type: VAC System Coverage Size (sq cm): 46 Pressure Type: Constant Pressure Setting: 125 mmHG Drain Type: None Primary Contact: Other Quantity of Sponges/Gauze Inserted: 1 Sponge/Dressing Type: Foam- Black Date Initiated: 06/21/2023 Response to Treatment: Tolerated well Electronic Signature(s) Signed: 06/21/2023 12:52:25 PM By: Thayer Dallas Entered By: Thayer Dallas on 06/21/2023 09:45:55 -------------------------------------------------------------------------------- Patient/Caregiver Education Details Patient Name: Date of Service: [name] [name]. 11/8/2024andnbsp9:30 A M Medical Record Number: [id number] Patient Account Number: [id number] Date of Birth/Gender: Treating RN: [birth year] (89 y.o. M) Primary Care Physician: Tana Conch Other Clinician: Thayer Dallas Referring Physician: Treating Physician/Extender: Arvin Collard in Treatment:  3 Education Assessment Education Provided To: Patient Education Topics Provided Electronic Signature(s) Signed: 06/21/2023 12:52:25 PM By: Thayer Dallas Entered By: Thayer Dallas on 06/21/2023 09:46:43 -------------------------------------------------------------------------------- Wound Assessment Details Patient Name: Date of Service: [name], Richard V [name]. 06/21/2023 9:30 A M Medical Record Number: [id number] Patient Account Number: [id number] Date of Birth/Sex: Treating RN: May 11, 1934 (89 y.o. M) Primary Care Alayshia Marini: Tana Conch Other Clinician: Tiburcio [name], [name] ([id number]) [id number] Page 3 of 4 Referring Dalaya Suppa: Treating [name]/Extender: Arvin Collard in Treatment: 3 Wound Status Wound Number: 1 Primary Etiology: Trauma, Other Wound Location: Left, Lateral Lower Leg  Wound Status: Open Wounding Event: Trauma Date Acquired: 05/14/2023 Weeks Of Treatment: 3 Clustered Wound: No Wound Measurements Length: (cm) 11.5 Width: (cm) 4 Depth: (cm) 0.5 Area: (cm) 36.128 Volume: (cm) 18.064 % Reduction in Area: 35.3% % Reduction in Volume: -61.8% Wound Description Classification: Full Thickness Without Exposed Suppo Exudate Amount: Large Exudate Type: Serosanguineous Exudate Color: red, brown rt Structures Periwound Skin Texture Texture Color No Abnormalities Noted: No No Abnormalities Noted: No Moisture No Abnormalities Noted: No Treatment Notes Wound #1 (Lower Leg) Wound Laterality: Left, Lateral Cleanser Soap and Water Discharge Instruction: May shower and wash wound with dial antibacterial soap and water prior to dressing change. Wound Cleanser Discharge Instruction: Cleanse the wound with wound cleanser prior to applying a clean dressing using gauze sponges, not tissue or cotton balls. Peri-Wound Care Sween Lotion (Moisturizing lotion) Discharge Instruction: Apply moisturizing lotion as  directed Topical Primary Dressing Hydrofera Blue Ready Transfer Foam, 4x5 (in/in) Discharge Instruction: Apply to wound bed as instructed Secondary Dressing ABD Pad, 8x10 Discharge Instruction: Apply over primary dressing as directed. Zetuvit Plus 4x8 in Discharge Instruction: Apply over primary dressing as directed. Secured With Compression Wrap Urgo K2 Lite, (equivalent to a 3 layer) two layer compression system, regular Discharge Instruction: Apply Urgo K2 Lite as directed (alternative to 3 layer compression). Compression Stockings Add-Ons Notes applied wound vac per orders. Electronic Signature(s) Signed: 06/21/2023 12:52:25 PM By: Thayer Dallas Entered By: Thayer Dallas on 06/21/2023 09:44:27 [name], [name] ([id number]) [id number] Page 4 of 4 -------------------------------------------------------------------------------- Vitals Details Patient Name: Date of Service: [name] [name]. 06/21/2023 9:30 A M Medical Record Number: [id number] Patient Account Number: [id number] Date of Birth/Sex: Treating RN: January 19, 1934 (89 y.o. M) Primary Care Genice Kimberlin: Tana Conch Other Clinician: Referring Tascha Casares: Treating Mylo Choi/Extender: Arvin Collard in Treatment: 3 Vital Signs Time Taken: 11:08 Reference Range: 80 - 120 mg / dl Height (in): 75 Weight (lbs): 238 Body Mass Index (BMI): 29.7 Electronic Signature(s) Signed: 06/21/2023 12:52:25 PM By: Thayer Dallas Entered By: Thayer Dallas on 06/21/2023 09:44:19

## 2023-06-21 NOTE — Progress Notes (Signed)
HARTFORD, SKYBERG ([id number]) [id number] Page 1 of 1 Visit Report for 06/21/2023 SuperBill Details Patient Name: Date of Service: HA Asencion Noble 06/21/2023 Medical Record Number: [id number] Patient Account Number: [id number] Date of Birth/Sex: Treating RN: 02-07-1934 (89 y.o. M) Primary Care Provider: Tana Conch Other Clinician: Referring Provider: Treating Provider/Extender: Arvin Collard in Treatment: 3 Diagnosis Coding ICD-10 Codes Code Description [phone] Non-pressure chronic ulcer of other part of left lower leg with other specified severity S80.12XD Contusion of left lower leg, subsequent encounter L03.116 Cellulitis of left lower limb T45.515S Adverse effect of anticoagulants, sequela Facility Procedures CPT4 Code Description Modifier Quantity [id number] 97605 - WOUND VAC-50 SQ CM OR LESS 1 Electronic Signature(s) Signed: 06/21/2023 11:04:04 AM By: [name] Signed: 06/21/2023 12:52:25 PM By: Thayer Dallas Entered By: Thayer Dallas on 06/21/2023 09:47:20

## 2023-06-25 ENCOUNTER — Encounter (HOSPITAL_BASED_OUTPATIENT_CLINIC_OR_DEPARTMENT_OTHER): Payer: Medicare Other | Admitting: General Surgery

## 2023-06-25 DIAGNOSIS — I495 Sick sinus syndrome: Secondary | ICD-10-CM | POA: Diagnosis not present

## 2023-06-25 DIAGNOSIS — S81802A Unspecified open wound, left lower leg, initial encounter: Secondary | ICD-10-CM | POA: Diagnosis not present

## 2023-06-25 DIAGNOSIS — I130 Hypertensive heart and chronic kidney disease with heart failure and stage 1 through stage 4 chronic kidney disease, or unspecified chronic kidney disease: Secondary | ICD-10-CM | POA: Diagnosis not present

## 2023-06-25 DIAGNOSIS — I509 Heart failure, unspecified: Secondary | ICD-10-CM | POA: Diagnosis not present

## 2023-06-25 DIAGNOSIS — L97828 Non-pressure chronic ulcer of other part of left lower leg with other specified severity: Secondary | ICD-10-CM | POA: Diagnosis not present

## 2023-06-25 DIAGNOSIS — I4891 Unspecified atrial fibrillation: Secondary | ICD-10-CM | POA: Diagnosis not present

## 2023-06-25 DIAGNOSIS — I272 Pulmonary hypertension, unspecified: Secondary | ICD-10-CM | POA: Diagnosis not present

## 2023-06-25 NOTE — Progress Notes (Signed)
VITALY, JASMER ([id number]) [id number] Page 1 of 8 Visit Report for 06/25/2023 Arrival Information Details Patient Name: Date of Service: [name] [name]. 06/25/2023 11:00 A M Medical Record Number: [id number] Patient Account Number: [id number] Date of Birth/Sex: Treating RN: 15-Dec-1933 (89 y.o. Dianna Limbo Primary Care Brianna Bennett: Tana Conch Other Clinician: Referring Belia Febo: Treating Kelbie Moro/Extender: Arvin Collard in Treatment: 4 Visit Information History Since Last Visit Added or deleted any medications: No Patient Arrived: Ambulatory Any new allergies or adverse reactions: No Arrival Time: 11:40 Had a fall or experienced change in No Accompanied By: self activities of daily living that may affect Transfer Assistance: None risk of falls: Patient Identification Verified: Yes Signs or symptoms of abuse/neglect since last visito No Patient Requires Transmission-Based Precautions: No Hospitalized since last visit: No Patient Has Alerts: No Implantable device outside of the clinic excluding No cellular tissue based products placed in the center since last visit: Pain Present Now: No Electronic Signature(s) Signed: 06/25/2023 4:47:31 PM By: [name] Entered By: Karie Schwalbe on 06/25/2023 09:27:08 -------------------------------------------------------------------------------- Compression Therapy Details Patient Name: Date of Service: [name], [name] [name]. 06/25/2023 11:00 A M Medical Record Number: [id number] Patient Account Number: [id number] Date of Birth/Sex: Treating RN: [birth year] (89 y.o. Dianna Limbo Primary Care Sammy Douthitt: Tana Conch Other Clinician: Referring Zakery Normington: Treating Raylynne Cubbage/Extender: Arvin Collard in Treatment: 4 Compression Therapy Performed for Wound Assessment: Wound #1 Left,Lateral Lower Leg Performed By: Clinician Karie Schwalbe,  RN Compression Type: Three Layer Post Procedure Diagnosis Same as Pre-procedure Electronic Signature(s) Signed: 06/25/2023 4:47:31 PM By: [name] Entered By: Karie Schwalbe on 06/25/2023 09:28:16 Encounter Discharge Information Details -------------------------------------------------------------------------------- [name] ([id number]) [id number] Page 2 of 8 Patient Name: Date of Service: [name] [name]. 06/25/2023 11:00 A M Medical Record Number: [id number] Patient Account Number: [id number] Date of Birth/Sex: Treating RN: 05-08-1934 (89 y.o. Dianna Limbo Primary Care Dawnisha Marquina: Tana Conch Other Clinician: Referring Anthon Harpole: Treating Braelin Costlow/Extender: Arvin Collard in Treatment: 4 Encounter Discharge Information Items Post Procedure Vitals Discharge Condition: Stable Temperature (F): 97.7 Ambulatory Status: Ambulatory Pulse (bpm): 67 Discharge Destination: Home Respiratory Rate (breaths/min): 16 Transportation: Private Auto Blood Pressure (mmHg): 111/65 Accompanied By: self Schedule Follow-up Appointment: Yes Clinical Summary of Care: Patient Declined Electronic Signature(s) Signed: 06/25/2023 4:47:31 PM By: [name] Entered By: Karie Schwalbe on 06/25/2023 09:36:06 -------------------------------------------------------------------------------- Lower Extremity Assessment Details Patient Name: Date of Service: [name] [name]. 06/25/2023 11:00 A M Medical Record Number: [id number] Patient Account Number: [id number] Date of Birth/Sex: Treating RN: [birth year] (89 y.o. Dianna Limbo Primary Care Charlette Hennings: Tana Conch Other Clinician: Referring Chancellor Vanderloop: Treating Milbern Doescher/Extender: Arvin Collard in Treatment: 4 Edema Assessment Assessed: Kyra Searles: No] [Right: No] Edema: [Left: Ye] [Right: s] Calf Left: Right: Point of Measurement: From Medial  Instep 39 cm Ankle Left: Right: Point of Measurement: From Medial Instep 25.8 cm Vascular Assessment Pulses: Dorsalis Pedis Palpable: [Left:Yes] Extremity colors, hair growth, and conditions: Extremity Color: [Left:Hyperpigmented] Hair Growth on Extremity: [Left:No] Temperature of Extremity: [Left:Warm] Capillary Refill: [Left:< 3 seconds] Dependent Rubor: [Left:No No] Electronic Signature(s) Signed: 06/25/2023 4:47:31 PM By: [name] Entered By: Karie Schwalbe on 06/25/2023 09:27:53 [name], [name] ([id number]) [id number] Page 3 of 8 -------------------------------------------------------------------------------- Multi Wound Chart Details Patient Name: Date of Service: [name] [name]. 06/25/2023 11:00 A M Medical Record Number: [id number] Patient Account Number: [id number] Date of  Birth/Sex: Treating RN: [birth year] (89 y.o. M) Primary Care Micaela Stith: Tana Conch Other Clinician: Referring Taggert Bozzi: Treating Jaleiyah Alas/Extender: Arvin Collard in Treatment: 4 [1:Photos:] [N/A:N/A] Left, Lateral Lower Leg N/A N/A Wound Location: Trauma N/A N/A Wounding Event: Trauma, Other N/A N/A Primary Etiology: Arrhythmia, Congestive Heart Failure, N/A N/A Comorbid History: Coronary Artery Disease, Hypertension, Myocardial Infarction 05/14/2023 N/A N/A Date Acquired: 4 N/A N/A Weeks of Treatment: Open N/A N/A Wound Status: No N/A N/A Wound Recurrence: 10.9x3.2x0.2 N/A N/A Measurements L x W x D (cm) 27.395 N/A N/A A (cm) : rea 5.479 N/A N/A Volume (cm) : 50.90% N/A N/A % Reduction in Area: 50.90% N/A N/A % Reduction in Volume: Full Thickness Without Exposed N/A N/A Classification: Support Structures Large N/A N/A Exudate A mount: Serosanguineous N/A N/A Exudate Type: red, brown N/A N/A Exudate Color: Large (67-100%) N/A N/A Granulation A mount: Hyper-granulation, Friable N/A N/A Granulation  Quality: Small (1-33%) N/A N/A Necrotic A mount: Fat Layer (Subcutaneous Tissue): Yes N/A N/A Exposed Structures: Small (1-33%) N/A N/A Epithelialization: Scarring: Yes N/A N/A Periwound Skin Texture: No Abnormalities Noted N/A N/A Periwound Skin Moisture: No Abnormalities Noted N/A N/A Periwound Skin Color: No Abnormality N/A N/A Temperature: Treatment Notes Electronic Signature(s) Signed: 06/25/2023 12:09:18 PM By: [name] Entered By: Duanne Guess on 06/25/2023 09:09:18 -------------------------------------------------------------------------------- Multi-Disciplinary Care Plan Details Patient Name: Date of Service: [name], [name] [name]. 06/25/2023 11:00 A M Medical Record Number: [id number] Patient Account Number: [id number] Date of Birth/Sex: Treating RN: 1934-07-14 (89 y.o. Dianna Limbo Primary Care Timohty Renbarger: Tana Conch Other Clinician: Referring Maryn Freelove: Treating Deidre Carino/Extender: Laytin, Rubiano, Izael [name] ([id number]) [id number] Page 4 of 8 Weeks in Treatment: 4 Multidisciplinary Care Plan reviewed with physician Active Inactive Venous Leg Ulcer Nursing Diagnoses: Knowledge deficit related to disease process and management Potential for venous Insuffiency (use before diagnosis confirmed) Goals: Patient will maintain optimal edema control Date Initiated: 06/18/2023 Target Resolution Date: 09/13/2023 Goal Status: Active Interventions: Assess peripheral edema status every visit. Compression as ordered Treatment Activities: Therapeutic compression applied : 06/18/2023 Notes: Wound/Skin Impairment Nursing Diagnoses: Knowledge deficit related to smoking impact on wound healing Goals: Patient/caregiver will verbalize understanding of skin care regimen Date Initiated: 05/27/2023 Target Resolution Date: 09/13/2023 Goal Status: Active Interventions: Assess patient/caregiver ability to  obtain necessary supplies Assess patient/caregiver ability to perform ulcer/skin care regimen upon admission and as needed Assess ulceration(s) every visit Provide education on ulcer and skin care Screen for HBO Treatment Activities: Skin care regimen initiated : 05/27/2023 Topical wound management initiated : 05/27/2023 Notes: Electronic Signature(s) Signed: 06/25/2023 4:47:31 PM By: [name] Entered By: Karie Schwalbe on 06/25/2023 09:35:33 -------------------------------------------------------------------------------- Negative Pressure Wound Therapy Maintenance (NPWT) Details Patient Name: Date of Service: [name] [name]. 06/25/2023 11:00 A M Medical Record Number: [id number] Patient Account Number: [id number] Date of Birth/Sex: Treating RN: Dec 03, 1933 (89 y.o. Dianna Limbo Primary Care Varina Hulon: Tana Conch Other Clinician: Referring Royale Swamy: Treating Rayson Rando/Extender: Arvin Collard in Treatment: 4 NPWT Maintenance Performed for: Wound #1 Left, Lateral Lower Leg Performed By: Karie Schwalbe, RN Type: VAC System Coverage Size (sq cm): 34.88 Pressure Type: Constant Pressure Setting: 125 mmHG [name], [name] ([id number]) [id number] Page 5 of 8 Drain Type: None Primary Contact: Other : Sponge/Dressing Type: Foam- Black Date Initiated: 06/21/2023 Dressing Removed: Yes Quantity of Sponges/Gauze Removed: 1 Canister Changed: Yes Canister Exudate Volume: 25 Dressing Reapplied: Yes Quantity of Sponges/Gauze Inserted: 1 Respones T Treatment: o  well Days On NPWT : 5 Post Procedure Diagnosis Same as Pre-procedure Notes Peel and Place Vac System (by 3 M) Electronic Signature(s) Signed: 06/25/2023 4:47:31 PM By: [name] Entered By: Karie Schwalbe on 06/25/2023 09:32:13 -------------------------------------------------------------------------------- Pain Assessment Details Patient Name:  Date of Service: [name], [name] [name]. 06/25/2023 11:00 A M Medical Record Number: [id number] Patient Account Number: [id number] Date of Birth/Sex: Treating RN: [birth year] (89 y.o. Dianna Limbo Primary Care Irean Kendricks: Tana Conch Other Clinician: Referring Jericka Kadar: Treating Devine Klingel/Extender: Arvin Collard in Treatment: 4 Active Problems Location of Pain Severity and Description of Pain Patient Has Paino No Site Locations Pain Management and Medication Current Pain Management: Electronic Signature(s) Signed: 06/25/2023 4:47:31 PM By: [name] Entered By: Karie Schwalbe on 06/25/2023 09:27:38 [name], [name] ([id number]) [id number] Page 6 of 8 -------------------------------------------------------------------------------- Patient/Caregiver Education Details Patient Name: Date of Service: [name] 11/12/2024andnbsp11:00 A M Medical Record Number: [id number] Patient Account Number: [id number] Date of Birth/Gender: Treating RN: [birth year] (89 y.o. Dianna Limbo Primary Care Physician: Tana Conch Other Clinician: Referring Physician: Treating Physician/Extender: Arvin Collard in Treatment: 4 Education Assessment Education Provided To: Patient Education Topics Provided Wound/Skin Impairment: Methods: Explain/Verbal Responses: State content correctly Electronic Signature(s) Signed: 06/25/2023 4:47:31 PM By: [name] Entered By: Karie Schwalbe on 06/25/2023 09:33:18 -------------------------------------------------------------------------------- Wound Assessment Details Patient Name: Date of Service: [name], [name] [name]. 06/25/2023 11:00 A M Medical Record Number: [id number] Patient Account Number: [id number] Date of Birth/Sex: Treating RN: 23-Dec-1933 (89 y.o. Dianna Limbo Primary Care Zadrian Mccauley: Tana Conch Other Clinician: Referring  Marty Sadlowski: Treating Aubriauna Riner/Extender: Arvin Collard in Treatment: 4 Wound Status Wound Number: 1 Primary Trauma, Other Etiology: Wound Location: Left, Lateral Lower Leg Wound Open Wounding Event: Trauma Status: Date Acquired: 05/14/2023 Comorbid Arrhythmia, Congestive Heart Failure, Coronary Artery Disease, Weeks Of Treatment: 4 History: Hypertension, Myocardial Infarction Clustered Wound: No Photos Wound Measurements Length: (cm) 10.9 Width: (cm) 3.2 [name], [name] ([id number]) Depth: (cm) 0.2 Area: (cm) 27.395 Volume: (cm) 5.479 % Reduction in Area: 50.9% % Reduction in Volume: 50.9% [id number] Page 7 of 8 Epithelialization: Small (1-33%) Tunneling: No Undermining: No Wound Description Classification: Full Thickness Without Exposed Su Exudate Amount: Large Exudate Type: Serosanguineous Exudate Color: red, brown pport Structures Wound Bed Granulation Amount: Large (67-100%) Exposed Structure Granulation Quality: Hyper-granulation, Friable Fat Layer (Subcutaneous Tissue) Exposed: Yes Necrotic Amount: Small (1-33%) Necrotic Quality: Adherent Slough Periwound Skin Texture Texture Color No Abnormalities Noted: No No Abnormalities Noted: Yes Scarring: Yes Temperature / Pain Temperature: No Abnormality Moisture No Abnormalities Noted: Yes Treatment Notes Wound #1 (Lower Leg) Wound Laterality: Left, Lateral Cleanser Soap and Water Discharge Instruction: May shower and wash wound with dial antibacterial soap and water prior to dressing change. Wound Cleanser Discharge Instruction: Cleanse the wound with wound cleanser prior to applying a clean dressing using gauze sponges, not tissue or cotton balls. Peri-Wound Care Sween Lotion (Moisturizing lotion) Discharge Instruction: Apply moisturizing lotion as directed Topical Primary Dressing Hydrofera Blue Ready Transfer Foam, 4x5 (in/in) Discharge Instruction: Apply  to wound bed as instructed Secondary Dressing ABD Pad, 8x10 Discharge Instruction: Apply over primary dressing as directed. Zetuvit Plus 4x8 in Discharge Instruction: Apply over primary dressing as directed. Secured With Compression Wrap Urgo K2 Lite, (equivalent to a 3 layer) two layer compression system, regular Discharge Instruction: Apply Urgo K2 Lite as directed (alternative to 3 layer compression). Compression Stockings Facilities manager) Signed:  06/25/2023 4:47:31 PM By: [name] Entered By: Karie Schwalbe on 06/25/2023 08:51:15 Vitals Details -------------------------------------------------------------------------------- [name] ([id number]) [id number] Page 8 of 8 Patient Name: Date of Service: [name] [name]. 06/25/2023 11:00 A M Medical Record Number: [id number] Patient Account Number: [id number] Date of Birth/Sex: Treating RN: 1933/12/17 (89 y.o. Dianna Limbo Primary Care Sherrise Liberto: Tana Conch Other Clinician: Referring Paytience Bures: Treating Celso Granja/Extender: Arvin Collard in Treatment: 4 Vital Signs Time Taken: 11:45 Temperature (F): 97.7 Height (in): 75 Pulse (bpm): 67 Weight (lbs): 238 Respiratory Rate (breaths/min): 16 Body Mass Index (BMI): 29.7 Blood Pressure (mmHg): 111/65 Reference Range: 80 - 120 mg / dl Electronic Signature(s) Signed: 06/25/2023 4:47:31 PM By: [name] Entered By: Karie Schwalbe on 06/25/2023 09:27:32

## 2023-06-25 NOTE — Progress Notes (Addendum)
[name] ([id number]) [id number] Page 1 of 8 Visit Report for 06/25/2023 Chief Complaint Document Details Patient Name: Date of Service: [name] [name]. 06/25/2023 11:00 A M Medical Record Number: [id number] Patient Account Number: [id number] Date of Birth/Sex: Treating RN: 05-31-1934 (89 y.o. M) Primary Care Provider: Tana Conch Other Clinician: Referring Provider: Treating Provider/Extender: Arvin Collard in Treatment: 4 Information Obtained from: Patient Chief Complaint 05/27/2023; patient comes in today with a traumatic wound on the left anterior lower leg Electronic Signature(s) Signed: 06/25/2023 12:09:31 PM By: [name] Entered By: Duanne Guess on 06/25/2023 09:09:31 -------------------------------------------------------------------------------- Debridement Details Patient Name: Date of Service: [name], [name]. 06/25/2023 11:00 A M Medical Record Number: [id number] Patient Account Number: [id number] Date of Birth/Sex: Treating RN: [birth year] (89 y.o. M) Primary Care Provider: Tana Conch Other Clinician: Referring Provider: Treating Provider/Extender: Arvin Collard in Treatment: 4 Debridement Performed for Assessment: Wound #1 Left,Lateral Lower Leg Performed By: Physician Duanne Guess, MD The following information was scribed by: Karie Schwalbe The information was scribed for: Duanne Guess Debridement Type: Debridement Level of Consciousness (Pre-procedure): Awake and Alert Pre-procedure Verification/Time Out Yes - 11:50 Taken: Start Time: 11:50 Pain Control: Lidocaine 4% T opical Solution Percent of Wound Bed Debrided: 100% T Area Debrided (cm): otal 27.38 Tissue and other material debrided: Viable, Non-Viable, Slough, Subcutaneous, Slough Level: Skin/Subcutaneous Tissue Debridement Description: Excisional Instrument:  Curette Bleeding: Minimum Hemostasis Achieved: Pressure Response to Treatment: Procedure was tolerated well Level of Consciousness (Post- Awake and Alert procedure): Post Debridement Measurements of Total Wound Length: (cm) 10.9 Width: (cm) 3.2 Depth: (cm) 0.2 Volume: (cm) 5.479 Character of Wound/Ulcer Post Debridement: Improved Post Procedure Diagnosis [name], [name] ([id number]) [id number] Page 2 of 8 Same as Pre-procedure Electronic Signature(s) Signed: 06/26/2023 8:16:15 AM By: [name] Previous Signature: 06/25/2023 2:54:12 PM Version By: [name] Previous Signature: 06/25/2023 4:47:31 PM Version By: [name] Entered By: Duanne Guess on 06/26/2023 05:16:15 -------------------------------------------------------------------------------- HPI Details Patient Name: Date of Service: [name], [name]. 06/25/2023 11:00 A M Medical Record Number: [id number] Patient Account Number: [id number] Date of Birth/Sex: Treating RN: 1934/03/14 (89 y.o. M) Primary Care Provider: Tana Conch Other Clinician: Referring Provider: Treating Provider/Extender: Arvin Collard in Treatment: 4 History of Present Illness HPI Description: ADMISSION 05/27/2023; The patient is a 89 year old man who traumatized his left leg on a bed frame in [place] on . The wound was initially repaired in the ER with 17 sutures. He has been seen twice in urgent care on 10/8. He was given mupirocin and doxycycline. He was again seen in urgent care on 10/10 felt to have cellulitis and had Cipro added to the doxycycline which she is still taking. The patient is on Eliquis for history of sick sinus syndrome and atrial fibrillation. The patient is not complaining of a lot of pain. He is able to show me pictures of progressive skin necrosis just lateral to the original wound. Past medical history includes sick sinus  syndrome, coronary artery disease, aortic stenosis, chronic kidney disease. He is on Eliquis ABI in our clinic at 1.04 on the right and 1.06 on the left 06/03/2023: The cellulitis is still fairly persistent. There are multiple sutures still intact but they are pulling through the soft tissue. There is an odor coming from the wound. 06/11/2023: The wound is much cleaner this week. There is no longer any odor. There  is still a fair amount of slough present with eschar around the edges. The culture that I took last week returned with E. coli and staph species, sensitive to the Levaquin I had prescribed. He is currently taking this. 06/18/2023: The wound is very clean this week but the granulation tissue is quite hypertrophic. There is no odor. Edema control is good. The wound VAC is being delivered today. 06/25/2023: The wound has responded nicely to negative pressure wound therapy. It has contracted quite a bit. The hypertrophic granulation tissue has reaccumulated, however. Electronic Signature(s) Signed: 06/25/2023 12:10:24 PM By: [name] Entered By: Duanne Guess on 06/25/2023 09:10:24 -------------------------------------------------------------------------------- Physical Exam Details Patient Name: Date of Service: [name], [name]. 06/25/2023 11:00 A M Medical Record Number: [id number] Patient Account Number: [id number] Date of Birth/Sex: Treating RN: Jun 24, 1934 (89 y.o. M) Primary Care Provider: Tana Conch Other Clinician: Referring Provider: Treating Provider/Extender: Arvin Collard in Treatment: 4 Constitutional no acute distress. Respiratory [name] ([id number]) [id number] Page 3 of 8 Normal work of breathing on room air.. Notes 06/25/2023: The wound has responded nicely to negative pressure wound therapy. It has contracted quite a bit. The hypertrophic granulation tissue has reaccumulated,  however. Electronic Signature(s) Signed: 06/25/2023 12:12:44 PM By: [name] Entered By: Duanne Guess on 06/25/2023 09:12:44 -------------------------------------------------------------------------------- Physician Orders Details Patient Name: Date of Service: [name], [name]. 06/25/2023 11:00 A M Medical Record Number: [id number] Patient Account Number: [id number] Date of Birth/Sex: Treating RN: 1934-06-03 (89 y.o. Dianna Limbo Primary Care Provider: Tana Conch Other Clinician: Referring Provider: Treating Provider/Extender: Arvin Collard in Treatment: 4 Verbal / Phone Orders: No Diagnosis Coding ICD-10 Coding Code Description [phone] Non-pressure chronic ulcer of other part of left lower leg with other specified severity S80.12XD Contusion of left lower leg, subsequent encounter L03.116 Cellulitis of left lower limb T45.515S Adverse effect of anticoagulants, sequela Follow-up Appointments ppointment in 1 week. - Dr. Lady Gary - RM 3 Return A Tues 11/19@ 8:30am Nurse Visit: - Friday 11/8 @ 09:30 am for [facility] placement Anesthetic (In clinic) Topical Lidocaine 4% applied to wound bed Bathing/ Shower/ Hygiene May shower with protection but do not get wound dressing(s) wet. Protect dressing(s) with water repellant cover (for example, large plastic bag) or a cast cover and may then take shower. Negative Presssure Wound Therapy Wound Vac to wound continuously at 16mm/hg pressure - apply at nurse visit 11/8 Peel and Place dressing wound Vac System Edema Control - Orders / Instructions Left Lower Extremity Elevate legs to the level of the heart or above for 30 minutes daily and/or when sitting for 3-4 times a day throughout the day. Avoid standing for long periods of time. Exercise regularly Lymphedema Treatment Plan - Exercise, Compression and Elevation Exercise daily as tolerated. (Walking, ROM, Calf Pumps and Toe  Taps) Compression Wraps as ordered Elevate legs 30 - 60 minutes at or above heart level at least 3 - 4 times daily as able/tolerated Avoid standing for long periods and elevate leg(s) parallel to the floor when sitting Wound Treatment Wound #1 - Lower Leg Wound Laterality: Left, Lateral Cleanser: Soap and Water 1 x Per Week/30 Days Discharge Instructions: May shower and wash wound with dial antibacterial soap and water prior to dressing change. Cleanser: Wound Cleanser 1 x Per Week/30 Days Discharge Instructions: Cleanse the wound with wound cleanser prior to applying a clean dressing using gauze sponges, not tissue or cotton balls. Peri-Wound  Care: Sween Lotion (Moisturizing lotion) 1 x Per Week/30 Days Discharge Instructions: Apply moisturizing lotion as directed IKER, HYLAND ([id number]) [id number] Page 4 of 8 Prim Dressing: Hydrofera Blue Ready Transfer Foam, 4x5 (in/in) 1 x Per Week/30 Days ary Discharge Instructions: Apply to wound bed as instructed Prim Dressing: NPQWT 1 x Per Week/30 Days ary Discharge Instructions: Peel and Place Wound Vac system Secondary Dressing: ABD Pad, 8x10 1 x Per Week/30 Days Discharge Instructions: Apply over primary dressing as directed. Secondary Dressing: Zetuvit Plus 4x8 in 1 x Per Week/30 Days Discharge Instructions: Apply over primary dressing as directed. Compression Wrap: Urgo K2 Lite, (equivalent to a 3 layer) two layer compression system, regular 1 x Per Week/30 Days Discharge Instructions: Apply Urgo K2 Lite as directed (alternative to 3 layer compression). Electronic Signature(s) Signed: 06/25/2023 2:54:12 PM By: [name] Signed: 06/25/2023 4:47:31 PM By: [name] Entered By: Karie Schwalbe on 06/25/2023 09:35:04 -------------------------------------------------------------------------------- Problem List Details Patient Name: Date of Service: [name], [name]. 06/25/2023 11:00 A  M Medical Record Number: [id number] Patient Account Number: [id number] Date of Birth/Sex: Treating RN: [birth year] (89 y.o. M) Primary Care Provider: Tana Conch Other Clinician: Referring Provider: Treating Provider/Extender: Arvin Collard in Treatment: 4 Active Problems ICD-10 Encounter Code Description Active Date MDM Diagnosis L97.828 Non-pressure chronic ulcer of other part of left lower leg with other specified 05/27/2023 No Yes severity S80.12XD Contusion of left lower leg, subsequent encounter 05/27/2023 No Yes L03.116 Cellulitis of left lower limb 05/27/2023 No Yes T45.515S Adverse effect of anticoagulants, sequela 05/27/2023 No Yes Inactive Problems Resolved Problems Electronic Signature(s) Signed: 06/25/2023 12:09:09 PM By: [name] Entered By: Duanne Guess on 06/25/2023 09:09:09 Mennenga, Jong [name] ([id number]) [id number] Page 5 of 8 -------------------------------------------------------------------------------- Progress Note Details Patient Name: Date of Service: [name]. 06/25/2023 11:00 A M Medical Record Number: [id number] Patient Account Number: [id number] Date of Birth/Sex: Treating RN: 12/31/1933 (89 y.o. M) Primary Care Provider: Tana Conch Other Clinician: Referring Provider: Treating Provider/Extender: Arvin Collard in Treatment: 4 Subjective Chief Complaint Information obtained from Patient 05/27/2023; patient comes in today with a traumatic wound on the left anterior lower leg History of Present Illness (HPI) ADMISSION 05/27/2023; The patient is a 89 year old man who traumatized his left leg on a bed frame in [place] on . The wound was initially repaired in the ER with 17 sutures. He has been seen twice in urgent care on 10/8. He was given mupirocin and doxycycline. He was again seen in urgent care on 10/10 felt to have cellulitis  and had Cipro added to the doxycycline which she is still taking. The patient is on Eliquis for history of sick sinus syndrome and atrial fibrillation. The patient is not complaining of a lot of pain. He is able to show me pictures of progressive skin necrosis just lateral to the original wound. Past medical history includes sick sinus syndrome, coronary artery disease, aortic stenosis, chronic kidney disease. He is on Eliquis ABI in our clinic at 1.04 on the right and 1.06 on the left 06/03/2023: The cellulitis is still fairly persistent. There are multiple sutures still intact but they are pulling through the soft tissue. There is an odor coming from the wound. 06/11/2023: The wound is much cleaner this week. There is no longer any odor. There is still a fair amount of slough present with eschar around the edges. The culture that I took last  week returned with E. coli and staph species, sensitive to the Levaquin I had prescribed. He is currently taking this. 06/18/2023: The wound is very clean this week but the granulation tissue is quite hypertrophic. There is no odor. Edema control is good. The wound VAC is being delivered today. 06/25/2023: The wound has responded nicely to negative pressure wound therapy. It has contracted quite a bit. The hypertrophic granulation tissue has reaccumulated, however. Patient History Information obtained from Chart. Family History Cancer - Mother, Heart Disease - Father. Social History Never smoker, Alcohol Use - Never, Drug Use - No History. Medical History Cardiovascular Patient has history of Arrhythmia - a fib, Congestive Heart Failure, Coronary Artery Disease, Hypertension, Myocardial Infarction Hospitalization/Surgery History - cardiac cath. - cardiac angiogram. - angioplasty. - joint replacement. - knee arthroscopy. - tonsillectomy. - lap chole. - cataract surgery. - pilonidal cystectomy. Medical A Surgical History Notes nd Respiratory Pulmonary  Hypertension Genitourinary BPH, CKD Objective Constitutional no acute distress. Vitals Time Taken: 11:45 AM, Height: 75 in, Weight: 238 lbs, BMI: 29.7, Temperature: 97.7 F, Pulse: 67 bpm, Respiratory Rate: 16 breaths/min, Blood Pressure: 111/65 mmHg. ARBA, WOOTAN ([id number]) [id number] Page 6 of 8 Respiratory Normal work of breathing on room air.. General Notes: 06/25/2023: The wound has responded nicely to negative pressure wound therapy. It has contracted quite a bit. The hypertrophic granulation tissue has reaccumulated, however. Integumentary (Hair, Skin) Wound #1 status is Open. Original cause of wound was Trauma. The date acquired was: 05/14/2023. The wound has been in treatment 4 weeks. The wound is located on the Left,Lateral Lower Leg. The wound measures 10.9cm length x 3.2cm width x 0.2cm depth; 27.395cm^2 area and 5.479cm^3 volume. There is Fat Layer (Subcutaneous Tissue) exposed. There is no tunneling or undermining noted. There is a large amount of serosanguineous drainage noted. There is large (67-100%) friable, hyper - granulation within the wound bed. There is a small (1-33%) amount of necrotic tissue within the wound bed including Adherent Slough. The periwound skin appearance had no abnormalities noted for moisture. The periwound skin appearance had no abnormalities noted for color. The periwound skin appearance exhibited: Scarring. Periwound temperature was noted as No Abnormality. Assessment Active Problems ICD-10 Non-pressure chronic ulcer of other part of left lower leg with other specified severity Contusion of left lower leg, subsequent encounter Cellulitis of left lower limb Adverse effect of anticoagulants, sequela Procedures Wound #1 Pre-procedure diagnosis of Wound #1 is a Trauma, Other located on the Left,Lateral Lower Leg . There was a Excisional Skin/Subcutaneous Tissue Debridement with a total area of 27.38 sq cm performed  by Duanne Guess, MD. With the following instrument(s): Curette to remove Viable and Non-Viable tissue/material. Material removed includes Subcutaneous Tissue and Slough and after achieving pain control using Lidocaine 4% T opical Solution. No specimens were taken. A time out was conducted at 11:50, prior to the start of the procedure. A Minimum amount of bleeding was controlled with Pressure. The procedure was tolerated well. Post Debridement Measurements: 10.9cm length x 3.2cm width x 0.2cm depth; 5.479cm^3 volume. Character of Wound/Ulcer Post Debridement is improved. Post procedure Diagnosis Wound #1: Same as Pre-Procedure Pre-procedure diagnosis of Wound #1 is a Trauma, Other located on the Left,Lateral Lower Leg . There was a Three Layer Compression Therapy Procedure by Karie Schwalbe, RN. Post procedure Diagnosis Wound #1: Same as Pre-Procedure Plan Follow-up Appointments: Return Appointment in 1 week. - Dr. Lady Gary - RM 3 Tues 11/19@ 8:30am Nurse Visit: - Friday 11/8 @ 09:30 am for [facility]  placement Anesthetic: (In clinic) Topical Lidocaine 4% applied to wound bed Bathing/ Shower/ Hygiene: May shower with protection but do not get wound dressing(s) wet. Protect dressing(s) with water repellant cover (for example, large plastic bag) or a cast cover and may then take shower. Negative Presssure Wound Therapy: Wound Vac to wound continuously at 159mm/hg pressure - apply at nurse visit 11/8 Peel and Place dressing wound Vac System Edema Control - Orders / Instructions: Elevate legs to the level of the heart or above for 30 minutes daily and/or when sitting for 3-4 times a day throughout the day. Avoid standing for long periods of time. Exercise regularly Lymphedema Treatment Plan - Exercise, Compression and Elevation: Exercise daily as tolerated. (Walking, ROM, Calf Pumps and T T oe aps) Compression Wraps as ordered Elevate legs 30 - 60 minutes at or above heart level at least 3 - 4  times daily as able/tolerated Avoid standing for long periods and elevate leg(s) parallel to the floor when sitting WOUND #1: - Lower Leg Wound Laterality: Left, Lateral Cleanser: Soap and Water 1 x Per Week/30 Days Discharge Instructions: May shower and wash wound with dial antibacterial soap and water prior to dressing change. Cleanser: Wound Cleanser 1 x Per Week/30 Days Discharge Instructions: Cleanse the wound with wound cleanser prior to applying a clean dressing using gauze sponges, not tissue or cotton balls. Peri-Wound Care: Sween Lotion (Moisturizing lotion) 1 x Per Week/30 Days Discharge Instructions: Apply moisturizing lotion as directed Prim Dressing: Hydrofera Blue Ready Transfer Foam, 4x5 (in/in) 1 x Per Week/30 Days ary Discharge Instructions: Apply to wound bed as instructed Prim Dressing: NPQWT 1 x Per Week/30 Days ary Discharge Instructions: Peel and Place Wound Vac system [place], Raihan [name] ([id number]) [phone].pdf Page 7 of 8 Secondary Dressing: ABD Pad, 8x10 1 x Per Week/30 Days Discharge Instructions: Apply over primary dressing as directed. Secondary Dressing: Zetuvit Plus 4x8 in 1 x Per Week/30 Days Discharge Instructions: Apply over primary dressing as directed. Compression Wrap: Urgo K2 Lite, (equivalent to a 3 layer) two layer compression system, regular 1 x Per Week/30 Days Discharge Instructions: Apply Urgo K2 Lite as directed (alternative to 3 layer compression). 06/25/2023: The wound has responded nicely to negative pressure wound therapy. It has contracted quite a bit. The hypertrophic granulation tissue has reaccumulated, however. I used a curette to debride slough and subcutaneous tissue from the wound surface. I then chemically cauterized the hypertrophic granulation tissue with silver nitrate. We will continue negative pressure wound therapy. Follow-up in 1 week. Electronic Signature(s) Signed: 06/26/2023 8:16:30 AM By: [name] Previous Signature: 06/25/2023 12:13:30 PM Version By: [name] Entered By: Duanne Guess on 06/26/2023 05:16:30 -------------------------------------------------------------------------------- HxROS Details Patient Name: Date of Service: [name], [name]. 06/25/2023 11:00 A M Medical Record Number: [id number] Patient Account Number: [id number] Date of Birth/Sex: Treating RN: 1933-11-14 (89 y.o. M) Primary Care Provider: Tana Conch Other Clinician: Referring Provider: Treating Provider/Extender: Arvin Collard in Treatment: 4 Information Obtained From Chart Respiratory Medical History: Past Medical History Notes: Pulmonary Hypertension Cardiovascular Medical History: Positive for: Arrhythmia - a fib; Congestive Heart Failure; Coronary Artery Disease; Hypertension; Myocardial Infarction Genitourinary Medical History: Past Medical History Notes: BPH, CKD Immunizations Pneumococcal Vaccine: Received Pneumococcal Vaccination: No Implantable Devices No devices added Hospitalization / Surgery History Type of Hospitalization/Surgery cardiac cath cardiac angiogram angioplasty joint replacement knee arthroscopy tonsillectomy lap chole cataract surgery Vierling, Thedore Rexene [name] ([id number]) [id number] Page 8 of 8 pilonidal cystectomy  Family and Social History Cancer: Yes - Mother; Heart Disease: Yes - Father; Never smoker; Alcohol Use: Never; Drug Use: No History; Financial Concerns: No; Food, Clothing or Shelter Needs: No; Support System Lacking: No; Transportation Concerns: No Electronic Signature(s) Signed: 06/25/2023 12:22:49 PM By: [name] Entered By: Duanne Guess on 06/25/2023 09:12:24 -------------------------------------------------------------------------------- SuperBill Details Patient Name: Date of Service: [name], [name]. 06/25/2023 Medical Record  Number: [id number] Patient Account Number: [id number] Date of Birth/Sex: Treating RN: [birth year] (89 y.o. M) Primary Care Provider: Tana Conch Other Clinician: Referring Provider: Treating Provider/Extender: Arvin Collard in Treatment: 4 Diagnosis Coding ICD-10 Codes Code Description [phone] Non-pressure chronic ulcer of other part of left lower leg with other specified severity S80.12XD Contusion of left lower leg, subsequent encounter L03.116 Cellulitis of left lower limb T45.515S Adverse effect of anticoagulants, sequela Facility Procedures : CPT4 Code: [id number] Description: 11042 - DEB SUBQ TISSUE 20 SQ CM/< ICD-10 Diagnosis Description L97.828 Non-pressure chronic ulcer of other part of left lower leg with other specified Modifier: severity Quantity: 1 : CPT4 Code: [id number] Description: 11045 - DEB SUBQ TISS EA ADDL 20CM ICD-10 Diagnosis Description L97.828 Non-pressure chronic ulcer of other part of left lower leg with other specified Modifier: severity Quantity: 1 Physician Procedures : CPT4 Code Description Modifier [id number] 99214 - WC PHYS LEVEL 4 - EST PT ICD-10 Diagnosis Description L97.828 Non-pressure chronic ulcer of other part of left lower leg with other specified severity S80.12XD Contusion of left lower leg, subsequent  encounter T45.515S Adverse effect of anticoagulants, sequela Quantity: 1 : [id number] 11042 - WC PHYS SUBQ TISS 20 SQ CM ICD-10 Diagnosis Description L97.828 Non-pressure chronic ulcer of other part of left lower leg with other specified severity Quantity: 1 : [id number] 11045 - WC PHYS SUBQ TISS EA ADDL 20 CM ICD-10 Diagnosis Description L97.828 Non-pressure chronic ulcer of other part of left lower leg with other specified severity Quantity: 1 Electronic Signature(s) Signed: 06/25/2023 12:14:51 PM By: [name] Previous Signature: 06/25/2023 12:13:46 PM Version By: [name] Entered By: Duanne Guess on 06/25/2023 09:14:51

## 2023-06-28 ENCOUNTER — Encounter (HOSPITAL_BASED_OUTPATIENT_CLINIC_OR_DEPARTMENT_OTHER): Payer: Medicare Other | Admitting: General Surgery

## 2023-06-28 DIAGNOSIS — L97828 Non-pressure chronic ulcer of other part of left lower leg with other specified severity: Secondary | ICD-10-CM | POA: Diagnosis not present

## 2023-06-28 DIAGNOSIS — I272 Pulmonary hypertension, unspecified: Secondary | ICD-10-CM | POA: Diagnosis not present

## 2023-06-28 DIAGNOSIS — I130 Hypertensive heart and chronic kidney disease with heart failure and stage 1 through stage 4 chronic kidney disease, or unspecified chronic kidney disease: Secondary | ICD-10-CM | POA: Diagnosis not present

## 2023-06-28 DIAGNOSIS — I4891 Unspecified atrial fibrillation: Secondary | ICD-10-CM | POA: Diagnosis not present

## 2023-06-28 DIAGNOSIS — I509 Heart failure, unspecified: Secondary | ICD-10-CM | POA: Diagnosis not present

## 2023-06-28 DIAGNOSIS — I495 Sick sinus syndrome: Secondary | ICD-10-CM | POA: Diagnosis not present

## 2023-06-28 NOTE — Progress Notes (Signed)
JAYVAN, ROPER ([id number]) [id number] Page 1 of 4 Visit Report for 06/28/2023 Arrival Information Details Patient Name: Date of Service: [name] ID H. 06/28/2023 9:00 A M Medical Record Number: [id number] Patient Account Number: [id number] Date of Birth/Sex: Treating RN: 06-28-1934 (89 y.o. M) Primary Care Aiyannah Fayad: Tana Conch Other Clinician: Referring Zuleyka Kloc: Treating Edelyn Heidel/Extender: Arvin Collard in Treatment: 4 Visit Information History Since Last Visit Added or deleted any medications: No Patient Arrived: Ambulatory Any new allergies or adverse reactions: No Arrival Time: 08:56 Had a fall or experienced change in No Accompanied By: self activities of daily living that may affect Transfer Assistance: None risk of falls: Patient Identification Verified: Yes Signs or symptoms of abuse/neglect since last visito No Secondary Verification Process Completed: Yes Hospitalized since last visit: No Patient Requires Transmission-Based Precautions: No Implantable device outside of the clinic excluding No Patient Has Alerts: No cellular tissue based products placed in the center since last visit: Has Dressing in Place as Prescribed: No Has Compression in Place as Prescribed: Yes Pain Present Now: No Notes vac suction came off Electronic Signature(s) Signed: 06/28/2023 12:18:19 PM By: Thayer Dallas Entered By: Thayer Dallas on 06/28/2023 09:33:35 -------------------------------------------------------------------------------- Compression Therapy Details Patient Name: Date of Service: [name], [name] 06/28/2023 9:00 A M Medical Record Number: [id number] Patient Account Number: [id number] Date of Birth/Sex: Treating RN: 1934/07/06 (89 y.o. M) Primary Care Benino Korinek: Tana Conch Other Clinician: Referring Tanee Henery: Treating Curran Lenderman/Extender: Arvin Collard in Treatment:  4 Compression Therapy Performed for Wound Assessment: Wound #1 Left,Lateral Lower Leg Performed By: Clinician Thayer Dallas, Compression Type: Double Layer Electronic Signature(s) Signed: 06/28/2023 12:18:19 PM By: Thayer Dallas Entered By: Thayer Dallas on 06/28/2023 09:42:15 Martes, Adib H ([id number]) [id number] Page 2 of 4 -------------------------------------------------------------------------------- Encounter Discharge Information Details Patient Name: Date of Service: [name] ID H. 06/28/2023 9:00 A M Medical Record Number: [id number] Patient Account Number: [id number] Date of Birth/Sex: Treating RN: 1934-02-08 (89 y.o. M) Primary Care Margues Filippini: Tana Conch Other Clinician: Thayer Dallas Referring Vidur Knust: Treating Aadan Chenier/Extender: Arvin Collard in Treatment: 4 Encounter Discharge Information Items Discharge Condition: Stable Ambulatory Status: Ambulatory Discharge Destination: Home Transportation: Private Auto Accompanied By: self Schedule Follow-up Appointment: Yes Clinical Summary of Care: Electronic Signature(s) Signed: 06/28/2023 12:18:19 PM By: Thayer Dallas Entered By: Thayer Dallas on 06/28/2023 09:43:08 -------------------------------------------------------------------------------- Negative Pressure Wound Therapy Maintenance (NPWT) Details Patient Name: Date of Service: [name] ID H. 06/28/2023 9:00 A M Medical Record Number: [id number] Patient Account Number: [id number] Date of Birth/Sex: Treating RN: [birth year] (89 y.o. M) Primary Care Avya Flavell: Tana Conch Other Clinician: Referring Camauri Fleece: Treating Amoree Newlon/Extender: Arvin Collard in Treatment: 4 NPWT Maintenance Performed for: Wound #1 Left, Lateral Lower Leg Performed By: Thayer Dallas, Type: VAC System Coverage Size (sq cm): 34.88 Pressure Type: Constant Pressure Setting: 125 mmHG Drain  Type: None Primary Contact: Other : Sponge/Dressing Type: Foam- Black Date Initiated: 06/21/2023 Dressing Removed: Yes Quantity of Sponges/Gauze Removed: 1 black foam Canister Changed: No Canister Exudate Volume: 30 Dressing Reapplied: Yes Quantity of Sponges/Gauze Inserted: 1 black foam Days On NPWT : 8 Electronic Signature(s) Signed: 06/28/2023 12:18:19 PM By: Thayer Dallas Entered By: Thayer Dallas on 06/28/2023 09:34:59 -------------------------------------------------------------------------------- Patient/Caregiver Education Details Patient Name: Date of Service: [name], [name] 11/15/2024andnbsp9:00 A M Medical Record Number: [id number] Patient Account Number: [id number] Date of Birth/Gender: Treating RN: [birth year] (89 y.o. M) Primary Care Physician: Tana Conch Other  Clinician: Aryel, Minicucci, Korry H ([id number]) [id number] Page 3 of 4 Referring Physician: Treating Physician/Extender: Arvin Collard in Treatment: 4 Education Assessment Education Provided To: Patient Education Topics Provided Electronic Signature(s) Signed: 06/28/2023 12:18:19 PM By: Thayer Dallas Entered By: Thayer Dallas on 06/28/2023 09:42:55 -------------------------------------------------------------------------------- Wound Assessment Details Patient Name: Date of Service: Richard RRIS, [name] 06/28/2023 9:00 A M Medical Record Number: [id number] Patient Account Number: [id number] Date of Birth/Sex: Treating RN: 1933-10-28 (89 y.o. M) Primary Care Joseandres Mazer: Tana Conch Other Clinician: Referring Mayeli Bornhorst: Treating Maxene Byington/Extender: Arvin Collard in Treatment: 4 Wound Status Wound Number: 1 Primary Etiology: Trauma, Other Wound Location: Left, Lateral Lower Leg Wound Status: Open Wounding Event: Trauma Date Acquired: 05/14/2023 Weeks Of Treatment: 4 Clustered Wound: No Wound  Measurements Length: (cm) 10.9 Width: (cm) 3.2 Depth: (cm) 0.2 Area: (cm) 27.395 Volume: (cm) 5.479 % Reduction in Area: 50.9% % Reduction in Volume: 50.9% Wound Description Classification: Full Thickness Without Exposed Suppo Exudate Amount: Large Exudate Type: Serosanguineous Exudate Color: red, brown rt Structures Periwound Skin Texture Texture Color No Abnormalities Noted: No No Abnormalities Noted: No Moisture No Abnormalities Noted: No Treatment Notes Wound #1 (Lower Leg) Wound Laterality: Left, Lateral Cleanser Soap and Water Discharge Instruction: May shower and wash wound with dial antibacterial soap and water prior to dressing change. Wound Cleanser Discharge Instruction: Cleanse the wound with wound cleanser prior to applying a clean dressing using gauze sponges, not tissue or cotton balls. Peri-Wound Care Sween Lotion (Moisturizing lotion) Axon, Xyler H ([id number]) [id number] Page 4 of 4 Discharge Instruction: Apply moisturizing lotion as directed Topical Primary Dressing NPQWT Discharge Instruction: Peel and Place Wound Vac system Secondary Dressing ABD Pad, 8x10 Discharge Instruction: Apply over primary dressing as directed. Secured With Compression Wrap Urgo K2 Lite, (equivalent to a 3 layer) two layer compression system, regular Discharge Instruction: Apply Urgo K2 Lite as directed (alternative to 3 layer compression). Compression Stockings Add-Ons Electronic Signature(s) Signed: 06/28/2023 12:18:19 PM By: Thayer Dallas Entered By: Thayer Dallas on 06/28/2023 09:33:50 -------------------------------------------------------------------------------- Vitals Details Patient Name: Date of Service: Richard RRIS, [name] 06/28/2023 9:00 A M Medical Record Number: [id number] Patient Account Number: [id number] Date of Birth/Sex: Treating RN: 1933-09-11 (89 y.o. M) Primary Care Filip Luten: Tana Conch Other  Clinician: Referring Zeidy Tayag: Treating Aubreanna Percle/Extender: Arvin Collard in Treatment: 4 Vital Signs Time Taken: 09:33 Reference Range: 80 - 120 mg / dl Height (in): 75 Weight (lbs): 238 Body Mass Index (BMI): 29.7 Electronic Signature(s) Signed: 06/28/2023 12:18:19 PM By: Thayer Dallas Entered By: Thayer Dallas on 06/28/2023 09:33:41

## 2023-06-28 NOTE — Progress Notes (Signed)
TRICE, SCHMOCK ([id number]) [id number] Page 1 of 1 Visit Report for 06/28/2023 SuperBill Details Patient Name: Date of Service: [name] 06/28/2023 Medical Record Number: [id number] Patient Account Number: [id number] Date of Birth/Sex: Treating RN: [birth year] (89 y.o. M) Primary Care Provider: Tana Conch Other Clinician: Thayer Dallas Referring Provider: Treating Provider/Extender: Arvin Collard in Treatment: 4 Diagnosis Coding ICD-10 Codes Code Description [phone] Non-pressure chronic ulcer of other part of left lower leg with other specified severity S80.12XD Contusion of left lower leg, subsequent encounter L03.116 Cellulitis of left lower limb T45.515S Adverse effect of anticoagulants, sequela Facility Procedures CPT4 Code Description Modifier Quantity [id number] 97605 - WOUND VAC-50 SQ CM OR LESS 1 [id number] (Facility Use Only) 29581LT - APPLY MULTLAY COMPRS LWR LT LEG 1 Electronic Signature(s) Signed: 06/28/2023 12:18:19 PM By: Thayer Dallas Signed: 06/28/2023 12:51:55 PM By: [name] Entered By: Thayer Dallas on 06/28/2023 06:43:39

## 2023-07-01 DIAGNOSIS — Z23 Encounter for immunization: Secondary | ICD-10-CM | POA: Diagnosis not present

## 2023-07-02 ENCOUNTER — Encounter (HOSPITAL_BASED_OUTPATIENT_CLINIC_OR_DEPARTMENT_OTHER): Payer: Medicare Other | Admitting: General Surgery

## 2023-07-02 DIAGNOSIS — I509 Heart failure, unspecified: Secondary | ICD-10-CM | POA: Diagnosis not present

## 2023-07-02 DIAGNOSIS — I495 Sick sinus syndrome: Secondary | ICD-10-CM | POA: Diagnosis not present

## 2023-07-02 DIAGNOSIS — I272 Pulmonary hypertension, unspecified: Secondary | ICD-10-CM | POA: Diagnosis not present

## 2023-07-02 DIAGNOSIS — I4891 Unspecified atrial fibrillation: Secondary | ICD-10-CM | POA: Diagnosis not present

## 2023-07-02 DIAGNOSIS — L97828 Non-pressure chronic ulcer of other part of left lower leg with other specified severity: Secondary | ICD-10-CM | POA: Diagnosis not present

## 2023-07-02 DIAGNOSIS — I130 Hypertensive heart and chronic kidney disease with heart failure and stage 1 through stage 4 chronic kidney disease, or unspecified chronic kidney disease: Secondary | ICD-10-CM | POA: Diagnosis not present

## 2023-07-02 DIAGNOSIS — S81802A Unspecified open wound, left lower leg, initial encounter: Secondary | ICD-10-CM | POA: Diagnosis not present

## 2023-07-02 NOTE — Progress Notes (Signed)
WAYMON, DERENNE ([id number]) [id number] Page 1 of 8 Visit Report for 07/02/2023 Arrival Information Details Patient Name: Date of Service: [name] [name]. 07/02/2023 11:15 A M Medical Record Number: [id number] Patient Account Number: [id number] Date of Birth/Sex: Treating RN: 08-Oct-1933 (89 y.o. Male) [name] Primary Care Lalani Winkles: Tana Conch Other Clinician: Referring Zeva Leber: Treating Aleja Yearwood/Extender: Arvin Collard in Treatment: 5 Visit Information History Since Last Visit Added or deleted any medications: No Patient Arrived: Ambulatory Any new allergies or adverse reactions: No Arrival Time: 11:19 Had a fall or experienced change in No Accompanied By: self activities of daily living that may affect Transfer Assistance: None risk of falls: Patient Identification Verified: Yes Signs or symptoms of abuse/neglect since last visito No Secondary Verification Process Completed: Yes Hospitalized since last visit: No Patient Requires Transmission-Based Precautions: No Implantable device outside of the clinic excluding No Patient Has Alerts: No cellular tissue based products placed in the center since last visit: Has Dressing in Place as Prescribed: Yes Has Compression in Place as Prescribed: Yes Pain Present Now: No Electronic Signature(s) Signed: 07/02/2023 4:21:34 PM By: [name] RN, BSN Entered By: [name] on 07/02/2023 08:25:13 -------------------------------------------------------------------------------- Compression Therapy Details Patient Name: Date of Service: [name], [name]. 07/02/2023 11:15 A M Medical Record Number: [id number] Patient Account Number: [id number] Date of Birth/Sex: Treating RN: Jun 04, 1934 (89 y.o. Male) [name] Primary Care Ruven Corradi: Tana Conch Other Clinician: Referring Dellia Donnelly: Treating Keiton Cosma/Extender: Arvin Collard in  Treatment: 5 Compression Therapy Performed for Wound Assessment: Wound #1 Left,Lateral Lower Leg Performed By: Clinician [name], RN Compression Type: Double Layer Post Procedure Diagnosis Same as Pre-procedure Notes urgo lite Electronic Signature(s) Signed: 07/02/2023 4:21:34 PM By: [name] RN, BSN Entered By: [name] on 07/02/2023 08:48:11 [name], [name] ([id number]) [id number] Page 2 of 8 -------------------------------------------------------------------------------- Encounter Discharge Information Details Patient Name: Date of Service: [name] [name]. 07/02/2023 11:15 A M Medical Record Number: [id number] Patient Account Number: [id number] Date of Birth/Sex: Treating RN: [birth year] (89 y.o. Male) [name] Primary Care Mahrosh Donnell: Tana Conch Other Clinician: Referring Vietta Bonifield: Treating Daquan Crapps/Extender: Arvin Collard in Treatment: 5 Encounter Discharge Information Items Discharge Condition: Stable Ambulatory Status: Ambulatory Discharge Destination: Home Transportation: Private Auto Accompanied By: self Schedule Follow-up Appointment: Yes Clinical Summary of Care: Patient Declined Electronic Signature(s) Signed: 07/02/2023 4:21:34 PM By: [name] RN, BSN Entered By: [name] on 07/02/2023 09:12:39 -------------------------------------------------------------------------------- Lower Extremity Assessment Details Patient Name: Date of Service: [name], [name]. 07/02/2023 11:15 A M Medical Record Number: [id number] Patient Account Number: [id number] Date of Birth/Sex: Treating RN: [birth year] (89 y.o. Male) [name] Primary Care Cenia Zaragosa: Tana Conch Other Clinician: Referring Mahogani Holohan: Treating Meris Reede/Extender: Arvin Collard in Treatment: 5 Edema Assessment Assessed: [Left: No] [Right: No] Edema: [Left: Ye] [Right:  s] Calf Left: Right: Point of Measurement: From Medial Instep 37 cm Ankle Left: Right: Point of Measurement: From Medial Instep 25.5 cm Vascular Assessment Pulses: Dorsalis Pedis Palpable: [Left:Yes] Extremity colors, hair growth, and conditions: Extremity Color: [Left:Hyperpigmented] Hair Growth on Extremity: [Left:No] Temperature of Extremity: [Left:Warm] Capillary Refill: [Left:< 3 seconds] Dependent Rubor: [Left:No No] Electronic Signature(s) [name], [name] ([id number]) [id number] Page 3 of 8 Signed: 07/02/2023 4:21:34 PM By: [name] RN, BSN Entered By: [name] on 07/02/2023 08:33:45 -------------------------------------------------------------------------------- Multi Wound Chart Details Patient Name: Date of Service: [name], [name]. 07/02/2023 11:15 A M Medical  Record Number: [id number] Patient Account Number: [id number] Date of Birth/Sex: Treating RN: 06-Nov-1933 (89 y.o. Male) Primary Care Trace Wirick: Tana Conch Other Clinician: Referring Assyria Morreale: Treating Tennelle Taflinger/Extender: Arvin Collard in Treatment: 5 Vital Signs Height(in): 75 Pulse(bpm): 90 Weight(lbs): 238 Blood Pressure(mmHg): 108/70 Body Mass Index(BMI): 29.7 Temperature(F): 98 Respiratory Rate(breaths/min): 18 [1:Photos:] [N/A:N/A] Left, Lateral Lower Leg N/A N/A Wound Location: Trauma N/A N/A Wounding Event: Trauma, Other N/A N/A Primary Etiology: Arrhythmia, Congestive Heart Failure, N/A N/A Comorbid History: Coronary Artery Disease, Hypertension, Myocardial Infarction 05/14/2023 N/A N/A Date Acquired: 5 N/A N/A Weeks of Treatment: Open N/A N/A Wound Status: No N/A N/A Wound Recurrence: 9.5x2.9x0.1 N/A N/A Measurements L x W x D (cm) 21.638 N/A N/A A (cm) : rea 2.164 N/A N/A Volume (cm) : 61.20% N/A N/A % Reduction in Area: 80.60% N/A N/A % Reduction in Volume: Full Thickness Without Exposed N/A  N/A Classification: Support Structures Medium N/A N/A Exudate Amount: Serosanguineous N/A N/A Exudate Type: red, brown N/A N/A Exudate Color: Flat and Intact N/A N/A Wound Margin: Large (67-100%) N/A N/A Granulation Amount: Red N/A N/A Granulation Quality: Small (1-33%) N/A N/A Necrotic Amount: Fat Layer (Subcutaneous Tissue): Yes N/A N/A Exposed Structures: Fascia: No Tendon: No Muscle: No Joint: No Bone: No Small (1-33%) N/A N/A Epithelialization: No Abnormalities Noted N/A N/A Periwound Skin Texture: No Abnormalities Noted N/A N/A Periwound Skin Moisture: Hemosiderin Staining: Yes N/A N/A Periwound Skin Color: No Abnormality N/A N/A Temperature: Chemical Cauterization N/A N/A Procedures Performed: Compression Therapy Negative Pressure Wound Therapy Maintenance (NPWT) Treatment Notes [name], [name] ([id number]) [id number] Page 4 of 8 Wound #1 (Lower Leg) Wound Laterality: Left, Lateral Cleanser Soap and Water Discharge Instruction: May shower and wash wound with dial antibacterial soap and water prior to dressing change. Wound Cleanser Discharge Instruction: Cleanse the wound with wound cleanser prior to applying a clean dressing using gauze sponges, not tissue or cotton balls. Peri-Wound Care Sween Lotion (Moisturizing lotion) Discharge Instruction: Apply moisturizing lotion as directed Topical Primary Dressing NPWT Discharge Instruction: Peel and Place Wound Vac system Secondary Dressing Secured With Compression Wrap Urgo K2 Lite, (equivalent to a 3 layer) two layer compression system, regular Discharge Instruction: Apply Urgo K2 Lite as directed (alternative to 3 layer compression). Compression Stockings Add-Ons Electronic Signature(s) Signed: 07/02/2023 12:21:54 PM By: [name] Entered By: Duanne Guess on 07/02/2023  09:21:54 -------------------------------------------------------------------------------- Multi-Disciplinary Care Plan Details Patient Name: Date of Service: [name], [name]. 07/02/2023 11:15 A M Medical Record Number: [id number] Patient Account Number: [id number] Date of Birth/Sex: Treating RN: 1934-05-27 (89 y.o. Male) [name] Primary Care Denis Carreon: Tana Conch Other Clinician: Referring Zuha Dejonge: Treating Aribella Vavra/Extender: Arvin Collard in Treatment: 5 Multidisciplinary Care Plan reviewed with physician Active Inactive Venous Leg Ulcer Nursing Diagnoses: Knowledge deficit related to disease process and management Potential for venous Insuffiency (use before diagnosis confirmed) Goals: Patient will maintain optimal edema control Date Initiated: 06/18/2023 Target Resolution Date: 09/13/2023 Goal Status: Active Interventions: Assess peripheral edema status every visit. Compression as ordered Treatment Activities: Therapeutic compression applied : 06/18/2023 Lepera, Tracer [name] ([id number]) [id number] Page 5 of 8 Notes: Wound/Skin Impairment Nursing Diagnoses: Knowledge deficit related to smoking impact on wound healing Goals: Patient/caregiver will verbalize understanding of skin care regimen Date Initiated: 05/27/2023 Target Resolution Date: 09/13/2023 Goal Status: Active Interventions: Assess patient/caregiver ability to obtain necessary supplies Assess patient/caregiver ability to perform ulcer/skin care regimen upon admission and as needed Assess ulceration(s) every visit Provide education on ulcer  and skin care Screen for HBO Treatment Activities: Skin care regimen initiated : 05/27/2023 Topical wound management initiated : 05/27/2023 Notes: Electronic Signature(s) Signed: 07/02/2023 4:21:34 PM By: [name] RN, BSN Entered By: [name] on 07/02/2023  08:46:45 -------------------------------------------------------------------------------- Negative Pressure Wound Therapy Maintenance (NPWT) Details Patient Name: Date of Service: [name] [name]. 07/02/2023 11:15 A M Medical Record Number: [id number] Patient Account Number: [id number] Date of Birth/Sex: Treating RN: 04-06-1934 (89 y.o. Male) [name] Primary Care Nthony Lefferts: Tana Conch Other Clinician: Referring Nate Common: Treating Keyaira Clapham/Extender: Arvin Collard in Treatment: 5 NPWT Maintenance Performed for: Wound #1 Left, Lateral Lower Leg Performed By: [name], RN Type: VAC System Coverage Size (sq cm): 27.55 Pressure Type: Constant Pressure Setting: 125 mmHG Drain Type: None Primary Contact: None Sponge/Dressing Type: Foam- Black Date Initiated: 06/21/2023 Dressing Removed: Yes Quantity of Sponges/Gauze Removed: 1 Canister Changed: No Canister Exudate Volume: 30 Dressing Reapplied: Yes Quantity of Sponges/Gauze Inserted: 1 Respones T Treatment: o good Days On NPWT : 12 Post Procedure Diagnosis Same as Pre-procedure Notes peel and place Electronic Signature(s) Signed: 07/02/2023 4:21:34 PM By: [name] RN, BSN Entered By: [name] on 07/02/2023 08:51:13 [name], [name] ([id number]) [id number] Page 6 of 8 -------------------------------------------------------------------------------- Pain Assessment Details Patient Name: Date of Service: [name] [name]. 07/02/2023 11:15 A M Medical Record Number: [id number] Patient Account Number: [id number] Date of Birth/Sex: Treating RN: Aug 31, 1933 (89 y.o. Male) [name] Primary Care Josefita Weissmann: Tana Conch Other Clinician: Referring Aakash Hollomon: Treating [name]/Extender: Arvin Collard in Treatment: 5 Active Problems Location of Pain Severity and Description of Pain Patient Has Paino No Site  Locations Rate the pain. Current Pain Level: 0 Pain Management and Medication Current Pain Management: Electronic Signature(s) Signed: 07/02/2023 4:21:34 PM By: [name] RN, BSN Entered By: [name] on 07/02/2023 08:41:38 -------------------------------------------------------------------------------- Patient/Caregiver Education Details Patient Name: Date of Service: [name] 11/19/2024andnbsp11:15 A M Medical Record Number: [id number] Patient Account Number: [id number] Date of Birth/Gender: Treating RN: 04-Aug-1934 (89 y.o. Male) [name] Primary Care Physician: Tana Conch Other Clinician: Referring Physician: Treating Physician/Extender: Arvin Collard in Treatment: 5 Education Assessment Education Provided To: Patient Education Topics Provided Venous: Methods: Explain/Verbal Responses: Reinforcements needed, State content correctly [name], [name] ([id number]) [id number] Page 7 of 8 Wound/Skin Impairment: Methods: Explain/Verbal Responses: Reinforcements needed, State content correctly Electronic Signature(s) Signed: 07/02/2023 4:21:34 PM By: [name] RN, BSN Entered By: [name] on 07/02/2023 08:47:47 -------------------------------------------------------------------------------- Wound Assessment Details Patient Name: Date of Service: [name], [name]. 07/02/2023 11:15 A M Medical Record Number: [id number] Patient Account Number: [id number] Date of Birth/Sex: Treating RN: 1933-08-21 (89 y.o. Male) [name] Primary Care Jaleel Allen: Tana Conch Other Clinician: Referring Brittinee Risk: Treating [name]/Extender: Arvin Collard in Treatment: 5 Wound Status Wound Number: 1 Primary Trauma, Other Etiology: Wound Location: Left, Lateral Lower Leg Wound Open Wounding Event: Trauma Status: Date Acquired: 05/14/2023 Comorbid Arrhythmia,  Congestive Heart Failure, Coronary Artery Disease, Weeks Of Treatment: 5 History: Hypertension, Myocardial Infarction Clustered Wound: No Photos Wound Measurements Length: (cm) 9.5 Width: (cm) 2.9 Depth: (cm) 0.1 Area: (cm) 21.638 Volume: (cm) 2.164 % Reduction in Area: 61.2% % Reduction in Volume: 80.6% Epithelialization: Small (1-33%) Tunneling: No Undermining: No Wound Description Classification: Full Thickness Without Exposed Suppor Wound Margin: Flat and Intact Exudate Amount: Medium Exudate Type: Serosanguineous Exudate Color: red, brown t Structures Foul Odor After Cleansing: No Slough/Fibrino No Wound  Bed Granulation Amount: Large (67-100%) Exposed Structure Granulation Quality: Red Fascia Exposed: No Necrotic Amount: Small (1-33%) Fat Layer (Subcutaneous Tissue) Exposed: Yes Necrotic Quality: Adherent Slough Tendon Exposed: No Muscle Exposed: No Joint Exposed: No Bone Exposed: No [address] Sinai [name] ([id number]) [id number] Page 8 of 8 Texture Color No Abnormalities Noted: Yes No Abnormalities Noted: No Hemosiderin Staining: Yes Moisture No Abnormalities Noted: Yes Temperature / Pain Temperature: No Abnormality Treatment Notes Wound #1 (Lower Leg) Wound Laterality: Left, Lateral Cleanser Soap and Water Discharge Instruction: May shower and wash wound with dial antibacterial soap and water prior to dressing change. Wound Cleanser Discharge Instruction: Cleanse the wound with wound cleanser prior to applying a clean dressing using gauze sponges, not tissue or cotton balls. Peri-Wound Care Sween Lotion (Moisturizing lotion) Discharge Instruction: Apply moisturizing lotion as directed Topical Primary Dressing NPWT Discharge Instruction: Peel and Place Wound Vac system Secondary Dressing Secured With Compression Wrap Urgo K2 Lite, (equivalent to a 3 layer) two layer compression system, regular Discharge  Instruction: Apply Urgo K2 Lite as directed (alternative to 3 layer compression). Compression Stockings Add-Ons Electronic Signature(s) Signed: 07/02/2023 4:21:34 PM By: [name] RN, BSN Entered By: [name] on 07/02/2023 08:37:09 -------------------------------------------------------------------------------- Vitals Details Patient Name: Date of Service: [name], [name]. 07/02/2023 11:15 A M Medical Record Number: [id number] Patient Account Number: [id number] Date of Birth/Sex: Treating RN: 22-Jan-1934 (89 y.o. Male) [name] Primary Care Pari Lombard: Tana Conch Other Clinician: Referring Rexanne Inocencio: Treating Adiya Selmer/Extender: Arvin Collard in Treatment: 5 Vital Signs Time Taken: 11:25 Temperature (F): 98 Height (in): 75 Pulse (bpm): 90 Weight (lbs): 238 Respiratory Rate (breaths/min): 18 Body Mass Index (BMI): 29.7 Blood Pressure (mmHg): 108/70 Reference Range: 80 - 120 mg / dl Electronic Signature(s) Signed: 07/02/2023 4:21:34 PM By: [name] RN, BSN Entered By: [name] on 07/02/2023 08:25:44

## 2023-07-02 NOTE — Progress Notes (Signed)
SAHARSH, COBARRUBIAS ([id number]) [id number] Page 1 of 8 Visit Report for 07/02/2023 Chief Complaint Document Details Patient Name: Date of Service: [name] [name]. 07/02/2023 11:15 A M Medical Record Number: [id number] Patient Account Number: [id number] Date of Birth/Sex: Treating RN: May 24, 1934 (89 y.o. Male) Primary Care Provider: Tana Conch Other Clinician: Referring Provider: Treating Provider/Extender: Arvin Collard in Treatment: 5 Information Obtained from: Patient Chief Complaint 05/27/2023; patient comes in today with a traumatic wound on the left anterior lower leg Electronic Signature(s) Signed: 07/02/2023 12:22:02 PM By: [name] Entered By: Duanne Guess on 07/02/2023 09:22:01 -------------------------------------------------------------------------------- HPI Details Patient Name: Date of Service: [name], Richard V [name]. 07/02/2023 11:15 A M Medical Record Number: [id number] Patient Account Number: [id number] Date of Birth/Sex: Treating RN: 1934-01-16 (89 y.o. Male) Primary Care Provider: Tana Conch Other Clinician: Referring Provider: Treating Provider/Extender: Arvin Collard in Treatment: 5 History of Present Illness HPI Description: ADMISSION 05/27/2023; The patient is a 89 year old man who traumatized his left leg on a bed frame in [place] on . The wound was initially repaired in the ER with 17 sutures. He has been seen twice in urgent care on 10/8. He was given mupirocin and doxycycline. He was again seen in urgent care on 10/10 felt to have cellulitis and had Cipro added to the doxycycline which she is still taking. The patient is on Eliquis for history of sick sinus syndrome and atrial fibrillation. The patient is not complaining of a lot of pain. He is able to show me pictures of progressive skin necrosis just lateral to the original wound. Past medical  history includes sick sinus syndrome, coronary artery disease, aortic stenosis, chronic kidney disease. He is on Eliquis ABI in our clinic at 1.04 on the right and 1.06 on the left 06/03/2023: The cellulitis is still fairly persistent. There are multiple sutures still intact but they are pulling through the soft tissue. There is an odor coming from the wound. 06/11/2023: The wound is much cleaner this week. There is no longer any odor. There is still a fair amount of slough present with eschar around the edges. The culture that I took last week returned with E. coli and staph species, sensitive to the Levaquin I had prescribed. He is currently taking this. 06/18/2023: The wound is very clean this week but the granulation tissue is quite hypertrophic. There is no odor. Edema control is good. The wound VAC is being delivered today. 06/25/2023: The wound has responded nicely to negative pressure wound therapy. It has contracted quite a bit. The hypertrophic granulation tissue has reaccumulated, however. 07/02/2023: The wound is smaller again this week. The hypertrophic granulation tissue has built up again. Edema control is good. Electronic Signature(s) Signed: 07/02/2023 12:22:30 PM By: [name] [name], [name] ([id number]) [id number] Page 2 of 8 Entered By: Duanne Guess on 07/02/2023 09:22:30 -------------------------------------------------------------------------------- Chemical Cauterization Details Patient Name: Date of Service: [name] [name]. 07/02/2023 11:15 A M Medical Record Number: [id number] Patient Account Number: [id number] Date of Birth/Sex: Treating RN: 1933-10-21 (89 y.o. Male) [name] Primary Care Provider: Tana Conch Other Clinician: Referring Provider: Treating Provider/Extender: Arvin Collard in Treatment: 5 Procedure Performed for: Wound #1 Left,Lateral Lower Leg Performed By:  Physician Duanne Guess, MD The following information was scribed by: [name] The information was scribed for: Duanne Guess Post Procedure Diagnosis Same as Pre-procedure Notes using silver nitrate sticks Electronic Signature(s)  Signed: 07/02/2023 1:11:13 PM By: [name] Signed: 07/02/2023 4:21:34 PM By: [name] RN, BSN Entered By: [name] on 07/02/2023 08:51:56 -------------------------------------------------------------------------------- Physical Exam Details Patient Name: Date of Service: [name], Richard V [name]. 07/02/2023 11:15 A M Medical Record Number: [id number] Patient Account Number: [id number] Date of Birth/Sex: Treating RN: June 02, 1934 (89 y.o. Male) Primary Care Provider: Tana Conch Other Clinician: Referring Provider: Treating Provider/Extender: Arvin Collard in Treatment: 5 Constitutional . . . . no acute distress. Respiratory Normal work of breathing on room air.. Notes 07/02/2023: The wound is smaller again this week. The hypertrophic granulation tissue has built up again. Edema control is good. Electronic Signature(s) Signed: 07/02/2023 12:23:09 PM By: [name] Entered By: Duanne Guess on 07/02/2023 09:23:08 [name], [name] ([id number]) [id number] Page 3 of 8 -------------------------------------------------------------------------------- Physician Orders Details Patient Name: Date of Service: [name] [name]. 07/02/2023 11:15 A M Medical Record Number: [id number] Patient Account Number: [id number] Date of Birth/Sex: Treating RN: 03/16/1934 (89 y.o. Male) [name] Primary Care Provider: Tana Conch Other Clinician: Referring Provider: Treating Provider/Extender: Arvin Collard in Treatment: 5 The following information was scribed by: [name] The information was scribed for: Duanne Guess Verbal / Phone Orders: No Diagnosis Coding ICD-10 Coding Code Description [phone] Non-pressure chronic ulcer of other part of left lower leg with other specified severity S80.12XD Contusion of left lower leg, subsequent encounter L03.116 Cellulitis of left lower limb T45.515S Adverse effect of anticoagulants, sequela Follow-up Appointments ppointment in 1 week. - Dr. Lady Gary - RM 3 Return A Mon 11/25@ 11:15 am ppointment in: - Tues 12/17 @ 11:15 am RM 1 Return A Nurse Visit: - Mon 12/2 @ 11:00 am Mon 12/9 @ 10:45 am Anesthetic (In clinic) Topical Lidocaine 4% applied to wound bed Bathing/ Shower/ Hygiene May shower with protection but do not get wound dressing(s) wet. Protect dressing(s) with water repellant cover (for example, large plastic bag) or a cast cover and may then take shower. Negative Presssure Wound Therapy Wound Vac to wound continuously at 18mm/hg pressure - Peel and Place dressing wound Vac System Edema Control - Orders / Instructions Left Lower Extremity Elevate legs to the level of the heart or above for 30 minutes daily and/or when sitting for 3-4 times a day throughout the day. Avoid standing for long periods of time. Exercise regularly Lymphedema Treatment Plan - Exercise, Compression and Elevation Exercise daily as tolerated. (Walking, ROM, Calf Pumps and Toe Taps) Compression Wraps as ordered Elevate legs 30 - 60 minutes at or above heart level at least 3 - 4 times daily as able/tolerated Avoid standing for long periods and elevate leg(s) parallel to the floor when sitting Wound Treatment Wound #1 - Lower Leg Wound Laterality: Left, Lateral Cleanser: Soap and Water 1 x Per Week/30 Days Discharge Instructions: May shower and wash wound with dial antibacterial soap and water prior to dressing change. Cleanser: Wound Cleanser 1 x Per Week/30 Days Discharge Instructions: Cleanse the wound with wound cleanser prior to applying a clean dressing using  gauze sponges, not tissue or cotton balls. Peri-Wound Care: Sween Lotion (Moisturizing lotion) 1 x Per Week/30 Days Discharge Instructions: Apply moisturizing lotion as directed Prim Dressing: NPWT 1 x Per Week/30 Days ary Discharge Instructions: Peel and Place Wound Vac system Compression Wrap: Urgo K2 Lite, (equivalent to a 3 layer) two layer compression system, regular 1 x Per Week/30 Days Discharge Instructions: Apply Urgo K2 Lite as  directed (alternative to 3 layer compression). Electronic Signature(s) Signed: 07/02/2023 1:11:13 PM By: [name] Entered By: Duanne Guess on 07/02/2023 09:25:52 Gonia, Haytham [name] ([id number]) [id number] Page 4 of 8 -------------------------------------------------------------------------------- Problem List Details Patient Name: Date of Service: [name] [name]. 07/02/2023 11:15 A M Medical Record Number: [id number] Patient Account Number: [id number] Date of Birth/Sex: Treating RN: Jul 11, 1934 (89 y.o. Male) [name] Primary Care Provider: Tana Conch Other Clinician: Referring Provider: Treating Provider/Extender: Arvin Collard in Treatment: 5 Active Problems ICD-10 Encounter Code Description Active Date MDM Diagnosis L97.828 Non-pressure chronic ulcer of other part of left lower leg with other specified 05/27/2023 No Yes severity S80.12XD Contusion of left lower leg, subsequent encounter 05/27/2023 No Yes L03.116 Cellulitis of left lower limb 05/27/2023 No Yes T45.515S Adverse effect of anticoagulants, sequela 05/27/2023 No Yes Inactive Problems Resolved Problems Electronic Signature(s) Signed: 07/02/2023 12:21:46 PM By: [name] Entered By: Duanne Guess on 07/02/2023 09:21:46 -------------------------------------------------------------------------------- Progress Note Details Patient Name: Date of Service: [name], Richard V [name]. 07/02/2023  11:15 A M Medical Record Number: [id number] Patient Account Number: [id number] Date of Birth/Sex: Treating RN: 08-09-1934 (89 y.o. Male) Primary Care Provider: Tana Conch Other Clinician: Referring Provider: Treating Provider/Extender: Arvin Collard in Treatment: 5 Subjective Chief Complaint Information obtained from Patient 05/27/2023; patient comes in today with a traumatic wound on the left anterior lower leg History of Present Illness (HPI) ADMISSION 05/27/2023; The patient is a 89 year old man who traumatized his left leg on a bed frame in [place] on . The wound was initially repaired in the ER with 17 sutures. EIRIK, [name] ([id number]) [id number] Page 5 of 8 He has been seen twice in urgent care on 10/8. He was given mupirocin and doxycycline. He was again seen in urgent care on 10/10 felt to have cellulitis and had Cipro added to the doxycycline which she is still taking. The patient is on Eliquis for history of sick sinus syndrome and atrial fibrillation. The patient is not complaining of a lot of pain. He is able to show me pictures of progressive skin necrosis just lateral to the original wound. Past medical history includes sick sinus syndrome, coronary artery disease, aortic stenosis, chronic kidney disease. He is on Eliquis ABI in our clinic at 1.04 on the right and 1.06 on the left 06/03/2023: The cellulitis is still fairly persistent. There are multiple sutures still intact but they are pulling through the soft tissue. There is an odor coming from the wound. 06/11/2023: The wound is much cleaner this week. There is no longer any odor. There is still a fair amount of slough present with eschar around the edges. The culture that I took last week returned with E. coli and staph species, sensitive to the Levaquin I had prescribed. He is currently taking this. 06/18/2023: The wound is very clean this week but the  granulation tissue is quite hypertrophic. There is no odor. Edema control is good. The wound VAC is being delivered today. 06/25/2023: The wound has responded nicely to negative pressure wound therapy. It has contracted quite a bit. The hypertrophic granulation tissue has reaccumulated, however. 07/02/2023: The wound is smaller again this week. The hypertrophic granulation tissue has built up again. Edema control is good. Patient History Information obtained from Chart. Family History Cancer - Mother, Heart Disease - Father. Social History Never smoker, Alcohol Use - Never, Drug Use - No History. Medical History Cardiovascular Patient has  history of Arrhythmia - a fib, Congestive Heart Failure, Coronary Artery Disease, Hypertension, Myocardial Infarction Hospitalization/Surgery History - cardiac cath. - cardiac angiogram. - angioplasty. - joint replacement. - knee arthroscopy. - tonsillectomy. - lap chole. - cataract surgery. - pilonidal cystectomy. Medical A Surgical History Notes nd Respiratory Pulmonary Hypertension Genitourinary BPH, CKD Objective Constitutional no acute distress. Vitals Time Taken: 11:25 AM, Height: 75 in, Weight: 238 lbs, BMI: 29.7, Temperature: 98 F, Pulse: 90 bpm, Respiratory Rate: 18 breaths/min, Blood Pressure: 108/70 mmHg. Respiratory Normal work of breathing on room air.. General Notes: 07/02/2023: The wound is smaller again this week. The hypertrophic granulation tissue has built up again. Edema control is good. Integumentary (Hair, Skin) Wound #1 status is Open. Original cause of wound was Trauma. The date acquired was: 05/14/2023. The wound has been in treatment 5 weeks. The wound is located on the Left,Lateral Lower Leg. The wound measures 9.5cm length x 2.9cm width x 0.1cm depth; 21.638cm^2 area and 2.164cm^3 volume. There is Fat Layer (Subcutaneous Tissue) exposed. There is no tunneling or undermining noted. There is a medium amount of  serosanguineous drainage noted. The wound margin is flat and intact. There is large (67-100%) red granulation within the wound bed. There is a small (1-33%) amount of necrotic tissue within the wound bed including Adherent Slough. The periwound skin appearance had no abnormalities noted for texture. The periwound skin appearance had no abnormalities noted for moisture. The periwound skin appearance exhibited: Hemosiderin Staining. Periwound temperature was noted as No Abnormality. Assessment Active Problems ICD-10 Non-pressure chronic ulcer of other part of left lower leg with other specified severity Contusion of left lower leg, subsequent encounter Cellulitis of left lower limb Adverse effect of anticoagulants, sequela [name], [name] ([id number]) [id number] Page 6 of 8 Procedures Wound #1 Pre-procedure diagnosis of Wound #1 is a Trauma, Other located on the Left,Lateral Lower Leg . There was a Double Layer Compression Therapy Procedure by [name], RN. Post procedure Diagnosis Wound #1: Same as Pre-Procedure Notes: urgo lite. Pre-procedure diagnosis of Wound #1 is a Trauma, Other located on the Left,Lateral Lower Leg . An Chemical Cauterization procedure was performed by Duanne Guess, MD. Post procedure Diagnosis Wound #1: Same as Pre-Procedure Notes: using silver nitrate sticks Plan Follow-up Appointments: Return Appointment in 1 week. - Dr. Lady Gary - RM 3 Mon 11/25@ 11:15 am Return Appointment in: - Tues 12/17 @ 11:15 am RM 1 Nurse Visit: - Mon 12/2 @ 11:00 am Mon 12/9 @ 10:45 am Anesthetic: (In clinic) Topical Lidocaine 4% applied to wound bed Bathing/ Shower/ Hygiene: May shower with protection but do not get wound dressing(s) wet. Protect dressing(s) with water repellant cover (for example, large plastic bag) or a cast cover and may then take shower. Negative Presssure Wound Therapy: Wound Vac to wound continuously at 116mm/hg pressure -  Peel and Place dressing wound Vac System Edema Control - Orders / Instructions: Elevate legs to the level of the heart or above for 30 minutes daily and/or when sitting for 3-4 times a day throughout the day. Avoid standing for long periods of time. Exercise regularly Lymphedema Treatment Plan - Exercise, Compression and Elevation: Exercise daily as tolerated. (Walking, ROM, Calf Pumps and T T oe aps) Compression Wraps as ordered Elevate legs 30 - 60 minutes at or above heart level at least 3 - 4 times daily as able/tolerated Avoid standing for long periods and elevate leg(s) parallel to the floor when sitting WOUND #1: - Lower Leg Wound Laterality: Left, Lateral Cleanser:  Soap and Water 1 x Per Week/30 Days Discharge Instructions: May shower and wash wound with dial antibacterial soap and water prior to dressing change. Cleanser: Wound Cleanser 1 x Per Week/30 Days Discharge Instructions: Cleanse the wound with wound cleanser prior to applying a clean dressing using gauze sponges, not tissue or cotton balls. Peri-Wound Care: Sween Lotion (Moisturizing lotion) 1 x Per Week/30 Days Discharge Instructions: Apply moisturizing lotion as directed Prim Dressing: NPWT 1 x Per Week/30 Days ary Discharge Instructions: Peel and Place Wound Vac system Com pression Wrap: Urgo K2 Lite, (equivalent to a 3 layer) two layer compression system, regular 1 x Per Week/30 Days Discharge Instructions: Apply Urgo K2 Lite as directed (alternative to 3 layer compression). 07/02/2023: The wound is smaller again this week. The hypertrophic granulation tissue has built up again. Edema control is good. The wounds did not require debridement today. I did chemically cauterize the hypertrophic granulation tissue with silver nitrate. We will continue negative pressure wound therapy and Urgo light compression. Follow-up in 1 week. Electronic Signature(s) Signed: 07/02/2023 12:26:22 PM By: [name] Entered  By: Duanne Guess on 07/02/2023 09:26:22 -------------------------------------------------------------------------------- HxROS Details Patient Name: Date of Service: [name], Richard V [name]. 07/02/2023 11:15 A M Medical Record Number: [id number] Patient Account Number: [id number] Date of Birth/Sex: Treating RN: 16-Mar-1934 (89 y.o. Male) [name], [name] ([id number]) [id number] Page 7 of 8 Primary Care Provider: Tana Conch Other Clinician: Referring Provider: Treating Provider/Extender: Arvin Collard in Treatment: 5 Information Obtained From Chart Respiratory Medical History: Past Medical History Notes: Pulmonary Hypertension Cardiovascular Medical History: Positive for: Arrhythmia - a fib; Congestive Heart Failure; Coronary Artery Disease; Hypertension; Myocardial Infarction Genitourinary Medical History: Past Medical History Notes: BPH, CKD Immunizations Pneumococcal Vaccine: Received Pneumococcal Vaccination: No Implantable Devices No devices added Hospitalization / Surgery History Type of Hospitalization/Surgery cardiac cath cardiac angiogram angioplasty joint replacement knee arthroscopy tonsillectomy lap chole cataract surgery pilonidal cystectomy Family and Social History Cancer: Yes - Mother; Heart Disease: Yes - Father; Never smoker; Alcohol Use: Never; Drug Use: No History; Financial Concerns: No; Food, Clothing or Shelter Needs: No; Support System Lacking: No; Transportation Concerns: No Electronic Signature(s) Signed: 07/02/2023 1:11:13 PM By: [name] Entered By: Duanne Guess on 07/02/2023 09:22:36 -------------------------------------------------------------------------------- SuperBill Details Patient Name: Date of Service: [name], Richard V [name]. 07/02/2023 Medical Record Number: [id number] Patient Account Number: [id number] Date of Birth/Sex: Treating RN: 1933/11/09 (89 y.o.  Male) Primary Care Provider: Tana Conch Other Clinician: Referring Provider: Treating Provider/Extender: Arvin Collard in Treatment: 5 Diagnosis Coding ICD-10 Codes Code Description [phone] Non-pressure chronic ulcer of other part of left lower leg with other specified severity ALAMEEN, MISENCIK ([id number]) [id number] Page 8 of 8 S80.12XD Contusion of left lower leg, subsequent encounter L03.116 Cellulitis of left lower limb T45.515S Adverse effect of anticoagulants, sequela Facility Procedures : CPT4 Code: 41324401 Description: 17250 - CHEM CAUT GRANULATION TISS ICD-10 Diagnosis Description L97.828 Non-pressure chronic ulcer of other part of left lower leg with other specified sever Modifier: ity Quantity: 1 : CPT4 Code: [id number] Description: (Facility Use Only) 29581LT - APPLY MULTLAY COMPRS LWR LT LEG Modifier: 59 Quantity: 1 Physician Procedures : CPT4 Code Description Modifier [id number] 99214 - WC PHYS LEVEL 4 - EST PT ICD-10 Diagnosis Description L97.828 Non-pressure chronic ulcer of other part of left lower leg with other specified severity S80.12XD Contusion of left lower leg, subsequent  encounter T45.515S Adverse effect of anticoagulants, sequela Quantity: 1 :  [id number] 17250 - WC PHYS CHEM CAUT GRAN TISSUE ICD-10 Diagnosis Description L97.828 Non-pressure chronic ulcer of other part of left lower leg with other specified severity Quantity: 1 Electronic Signature(s) Signed: 07/02/2023 4:12:59 PM By: [name] Signed: 07/02/2023 4:21:34 PM By: [name] RN, BSN Previous Signature: 07/02/2023 12:26:44 PM Version By: [name] Entered By: [name] on 07/02/2023 12:57:07

## 2023-07-08 ENCOUNTER — Encounter (HOSPITAL_BASED_OUTPATIENT_CLINIC_OR_DEPARTMENT_OTHER): Payer: Medicare Other | Admitting: General Surgery

## 2023-07-08 DIAGNOSIS — S81802A Unspecified open wound, left lower leg, initial encounter: Secondary | ICD-10-CM | POA: Diagnosis not present

## 2023-07-08 DIAGNOSIS — L97828 Non-pressure chronic ulcer of other part of left lower leg with other specified severity: Secondary | ICD-10-CM | POA: Diagnosis not present

## 2023-07-08 DIAGNOSIS — I130 Hypertensive heart and chronic kidney disease with heart failure and stage 1 through stage 4 chronic kidney disease, or unspecified chronic kidney disease: Secondary | ICD-10-CM | POA: Diagnosis not present

## 2023-07-08 DIAGNOSIS — I495 Sick sinus syndrome: Secondary | ICD-10-CM | POA: Diagnosis not present

## 2023-07-08 DIAGNOSIS — I272 Pulmonary hypertension, unspecified: Secondary | ICD-10-CM | POA: Diagnosis not present

## 2023-07-08 DIAGNOSIS — I4891 Unspecified atrial fibrillation: Secondary | ICD-10-CM | POA: Diagnosis not present

## 2023-07-08 DIAGNOSIS — I509 Heart failure, unspecified: Secondary | ICD-10-CM | POA: Diagnosis not present

## 2023-07-08 NOTE — Progress Notes (Signed)
MARQUE, GUILMETTE ([id number]) [id number] Page 1 of 8 Visit Report for 07/08/2023 Arrival Information Details Patient Name: Date of Service: [name] [name]. 07/08/2023 11:15 A M Medical Record Number: [id number] Patient Account Number: [id number] Date of Birth/Sex: Treating RN: 08-Apr-1934 (89 y.o. M) Primary Care [name]: [name] Other Clinician: Referring [name]: Treating [name]/Extender: [name] in Treatment: 6 Visit Information History Since Last Visit Added or deleted any medications: No Patient Arrived: Ambulatory Any new allergies or adverse reactions: No Arrival Time: 10:57 Had a fall or experienced change in No Accompanied By: self activities of daily living that may affect Transfer Assistance: None risk of falls: Patient Identification Verified: Yes Signs or symptoms of abuse/neglect since last visito No Secondary Verification Process Completed: Yes Hospitalized since last visit: No Patient Requires Transmission-Based Precautions: No Pain Present Now: No Patient Has Alerts: No Electronic Signature(s) Signed: 07/08/2023 2:57:56 PM By: [name] Entered By: [name] on 07/08/2023 07:58:20 -------------------------------------------------------------------------------- Compression Therapy Details Patient Name: Date of Service: [name]. 07/08/2023 11:15 A M Medical Record Number: [id number] Patient Account Number: [id number] Date of Birth/Sex: Treating RN: February 10, 1934 (89 y.o. [name]: [name] Other Clinician: Referring Jourdin Gens: Treating [name]/Extender: [name] in Treatment: 6 Compression Therapy Performed for Wound Assessment: Wound #1 Left,Lateral Lower Leg Performed By: Clinician Richard Schwalbe, RN Compression Type: Three Layer Post Procedure Diagnosis Same as Pre-procedure Electronic  Signature(s) Signed: 07/08/2023 11:59:44 AM By: Richard Schwalbe RN Entered By: [name] on 07/08/2023 08:19:44 -------------------------------------------------------------------------------- Encounter Discharge Information Details Patient Name: Date of Service: [name]. 07/08/2023 11:15 A M Medical Record Number: [id number] Patient Account Number: [id number] Date of Birth/Sex: Treating RN: [birth year] (89 y.o. [name], [name], [name] ([id number]) [id number] Page 2 of 8 Primary Care [name]: [name] Other Clinician: Referring [name]: Treating [name]/Extender: [name] in Treatment: 6 Encounter Discharge Information Items Discharge Condition: Stable Ambulatory Status: Ambulatory Discharge Destination: Home Transportation: Private Auto Accompanied By: self Schedule Follow-up Appointment: Yes Clinical Summary of Care: Patient Declined Electronic Signature(s) Signed: 07/08/2023 11:59:44 AM By: Richard Schwalbe RN Entered By: [name] on 07/08/2023 08:56:57 -------------------------------------------------------------------------------- Lower Extremity Assessment Details Patient Name: Date of Service: [name]. 07/08/2023 11:15 A M Medical Record Number: [id number] Patient Account Number: [id number] Date of Birth/Sex: Treating RN: [birth year] (89 y.o. [name] Primary Care Omolara Carol: [name] Other Clinician: Referring [name]: Treating [name]/Extender: [name] in Treatment: 6 Edema Assessment Assessed: [name]: No] [Right: No] Edema: [Left: Ye] [Right: s] Calf Left: Right: Point of Measurement: From Medial Instep 37 cm Ankle Left: Right: Point of Measurement: From Medial Instep 25.5 cm Vascular Assessment Pulses: Dorsalis Pedis Palpable: [Left:Yes] Extremity colors, hair growth, and conditions: Extremity Color:  [Left:Hyperpigmented] Hair Growth on Extremity: [Left:No] Temperature of Extremity: [Left:Warm] Capillary Refill: [Left:< 3 seconds] Dependent Rubor: [Left:No No] Electronic Signature(s) Signed: 07/08/2023 11:59:44 AM By: Richard Schwalbe RN Entered By: [name] on 07/08/2023 08:11:37 [name], [name] ([id number]) [id number] Page 3 of 8 -------------------------------------------------------------------------------- Multi Wound Chart Details Patient Name: Date of Service: [name]. 07/08/2023 11:15 A M Medical Record Number: [id number] Patient Account Number: [id number] Date of Birth/Sex: Treating RN: 1933/12/20 (89 y.o. M) Primary Care [name]: [name] Other Clinician: Referring [name]: Treating [name]/Extender: [name] in Treatment: 6 Vital Signs Height(in): 75 Pulse(bpm): 92 Weight(lbs): 238  Blood Pressure(mmHg): 103/65 Body Mass Index(BMI): 29.7 Temperature(F): 97.8 Respiratory Rate(breaths/min): 18 [1:Photos:] [N/A:N/A] Left, Lateral Lower Leg N/A N/A Wound Location: Trauma N/A N/A Wounding Event: Trauma, Other N/A N/A Primary Etiology: Arrhythmia, Congestive Heart Failure, N/A N/A Comorbid History: Coronary Artery Disease, Hypertension, Myocardial Infarction 05/14/2023 N/A N/A Date Acquired: 6 N/A N/A Weeks of Treatment: Open N/A N/A Wound Status: No N/A N/A Wound Recurrence: 9x2.7x0.1 N/A N/A Measurements L x W x D (cm) 19.085 N/A N/A A (cm) : rea 1.909 N/A N/A Volume (cm) : 65.80% N/A N/A % Reduction in Area: 82.90% N/A N/A % Reduction in Volume: Full Thickness Without Exposed N/A N/A Classification: Support Structures Medium N/A N/A Exudate Amount: Serosanguineous N/A N/A Exudate Type: red, brown N/A N/A Exudate Color: Flat and Intact N/A N/A Wound Margin: Large (67-100%) N/A N/A Granulation Amount: Red N/A N/A Granulation Quality: Small (1-33%) N/A  N/A Necrotic Amount: Fat Layer (Subcutaneous Tissue): Yes N/A N/A Exposed Structures: Fascia: No Tendon: No Muscle: No Joint: No Bone: No Small (1-33%) N/A N/A Epithelialization: No Abnormalities Noted N/A N/A Periwound Skin Texture: No Abnormalities Noted N/A N/A Periwound Skin Moisture: Hemosiderin Staining: Yes N/A N/A Periwound Skin Color: No Abnormality N/A N/A Temperature: Chemical Cauterization N/A N/A Procedures Performed: Compression Therapy Treatment Notes Electronic Signature(s) Signed: 07/08/2023 11:23:55 AM By: [name] Entered By: Duanne Guess on 07/08/2023 08:23:54 Urbanczyk, Garron [name] ([id number]) [id number] Page 4 of 8 -------------------------------------------------------------------------------- Multi-Disciplinary Care Plan Details Patient Name: Date of Service: [name] [name]. 07/08/2023 11:15 A M Medical Record Number: [id number] Patient Account Number: [id number] Date of Birth/Sex: Treating RN: 1934-03-25 (89 y.o. [name] Primary Care Ayeisha Lindenberger: [name] Other Clinician: Referring Amayia Ciano: Treating Gena Laski/Extender: [name] in Treatment: 6 Multidisciplinary Care Plan reviewed with physician Active Inactive Venous Leg Ulcer Nursing Diagnoses: Knowledge deficit related to disease process and management Potential for venous Insuffiency (use before diagnosis confirmed) Goals: Patient will maintain optimal edema control Date Initiated: 06/18/2023 Target Resolution Date: 09/13/2023 Goal Status: Active Interventions: Assess peripheral edema status every visit. Compression as ordered Treatment Activities: Therapeutic compression applied : 06/18/2023 Notes: Wound/Skin Impairment Nursing Diagnoses: Knowledge deficit related to smoking impact on wound healing Goals: Patient/caregiver will verbalize understanding of skin care regimen Date Initiated:  05/27/2023 Target Resolution Date: 09/13/2023 Goal Status: Active Interventions: Assess patient/caregiver ability to obtain necessary supplies Assess patient/caregiver ability to perform ulcer/skin care regimen upon admission and as needed Assess ulceration(s) every visit Provide education on ulcer and skin care Screen for HBO Treatment Activities: Skin care regimen initiated : 05/27/2023 Topical wound management initiated : 05/27/2023 Notes: Electronic Signature(s) Signed: 07/08/2023 11:59:44 AM By: Richard Schwalbe RN Entered By: [name] on 07/08/2023 08:55:19 [name], [name] ([id number]) [id number] Page 5 of 8 -------------------------------------------------------------------------------- Negative Pressure Wound Therapy Maintenance (NPWT) Details Patient Name: Date of Service: [name] [name]. 07/08/2023 11:15 A M Medical Record Number: [id number] Patient Account Number: [id number] Date of Birth/Sex: Treating RN: 1934/01/28 (89 y.o. [name] Primary Care Natia Fahmy: [name] Other Clinician: Referring Meka Lewan: Treating Avante Carneiro/Extender: [name] in Treatment: 6 NPWT Maintenance Performed for: Wound #1 Left, Lateral Lower Leg Performed By: Richard Schwalbe, RN Type: VAC System Coverage Size (sq cm): 24.3 Pressure Type: Constant Pressure Setting: 125 mmHG Drain Type: None Primary Contact: None Sponge/Dressing Type: Foam- Black Date Initiated: 06/21/2023 Dressing Removed: Yes Quantity of Sponges/Gauze Removed: 1 Canister Changed: Yes Canister Exudate Volume: 30 Dressing Reapplied: Yes Quantity of  Sponges/Gauze Inserted: 1 Respones T Treatment: o good Days On NPWT : 18 Post Procedure Diagnosis Same as Pre-procedure Electronic Signature(s) Signed: 07/08/2023 11:59:44 AM By: Richard Schwalbe RN Entered By: [name] on 07/08/2023  08:55:09 -------------------------------------------------------------------------------- Pain Assessment Details Patient Name: Date of Service: [name]. 07/08/2023 11:15 A M Medical Record Number: [id number] Patient Account Number: [id number] Date of Birth/Sex: Treating RN: 22-Mar-1934 (89 y.o. M) Primary Care Ahleah Simko: [name] Other Clinician: Referring Tandre Conly: Treating Biran Mayberry/Extender: [name] in Treatment: 6 Active Problems Location of Pain Severity and Description of Pain Patient Has Paino No Site Locations [name], [name] ([id number]) [id number] Page 6 of 8 Pain Management and Medication Current Pain Management: Electronic Signature(s) Signed: 07/08/2023 2:57:56 PM By: [name] Entered By: [name] on 07/08/2023 07:58:48 -------------------------------------------------------------------------------- Patient/Caregiver Education Details Patient Name: Date of Service: [name], [name]. 11/25/2024andnbsp11:15 A M Medical Record Number: [id number] Patient Account Number: [id number] Date of Birth/Gender: Treating RN: 03-16-1934 (89 y.o. [name] Primary Care Physician: [name] Other Clinician: Referring Physician: Treating Physician/Extender: [name] in Treatment: 6 Education Assessment Education Provided To: Patient Education Topics Provided Wound/Skin Impairment: Methods: Demonstration Responses: State content correctly Electronic Signature(s) Signed: 07/08/2023 11:59:44 AM By: Richard Schwalbe RN Entered By: [name] on 07/08/2023 08:55:33 -------------------------------------------------------------------------------- Wound Assessment Details Patient Name: Date of Service: [name]. 07/08/2023 11:15 A M Medical Record Number: [id number] Patient Account Number: [id number] Date of Birth/Sex: Treating RN: 1933/10/07 (89  y.o. M) Primary Care Merle Whitehorn: [name] Other Clinician: Referring Nija Koopman: Treating Jewelz Kobus/Extender: [name] in Treatment: 6 Wound Status Wound Number: 1 Primary Trauma, Other Etiology: Wound Location: Left, Lateral Lower Leg Wound Open Wounding Event: Trauma Status: Date Acquired: 05/14/2023 Comorbid Arrhythmia, Congestive Heart Failure, Coronary Artery Disease, Weeks Of Treatment: 6 History: Hypertension, Myocardial Infarction Clustered Wound: No Photos [name], Richard ([id number]) [id number] Page 7 of 8 Wound Measurements Length: (cm) 9 Width: (cm) 2.7 Depth: (cm) 0.1 Area: (cm) 19.085 Volume: (cm) 1.909 % Reduction in Area: 65.8% % Reduction in Volume: 82.9% Epithelialization: Small (1-33%) Tunneling: No Undermining: No Wound Description Classification: Full Thickness Without Exposed Support Structures Wound Margin: Flat and Intact Exudate Amount: Medium Exudate Type: Serosanguineous Exudate Color: red, brown Foul Odor After Cleansing: No Slough/Fibrino Yes Wound Bed Granulation Amount: Large (67-100%) Exposed Structure Granulation Quality: Red Fascia Exposed: No Necrotic Amount: Small (1-33%) Fat Layer (Subcutaneous Tissue) Exposed: Yes Necrotic Quality: Adherent Slough Tendon Exposed: No Muscle Exposed: No Joint Exposed: No Bone Exposed: No Periwound Skin Texture Texture Color No Abnormalities Noted: Yes No Abnormalities Noted: No Hemosiderin Staining: Yes Moisture No Abnormalities Noted: Yes Temperature / Pain Temperature: No Abnormality Treatment Notes Wound #1 (Lower Leg) Wound Laterality: Left, Lateral Cleanser Soap and Water Discharge Instruction: May shower and wash wound with dial antibacterial soap and water prior to dressing change. Wound Cleanser Discharge Instruction: Cleanse the wound with wound cleanser prior to applying a clean dressing using gauze sponges, not tissue  or cotton balls. Peri-Wound Care Sween Lotion (Moisturizing lotion) Discharge Instruction: Apply moisturizing lotion as directed Topical Primary Dressing NPWT Discharge Instruction: Peel and Place Wound Vac system Secondary Dressing Secured With Compression Wrap Urgo K2 Lite, (equivalent to a 3 layer) two layer compression system, regular Discharge Instruction: Apply Urgo K2 Lite as directed (alternative to 3 layer compression). Compression Stockings Add-Ons [name], [name] ([id number]) [id number] Page 8 of 8 Electronic Signature(s) Signed: 07/08/2023  11:59:44 AM By: Richard Schwalbe RN Entered By: [name] on 07/08/2023 08:16:59 -------------------------------------------------------------------------------- Vitals Details Patient Name: Date of Service: [name]. 07/08/2023 11:15 A M Medical Record Number: [id number] Patient Account Number: [id number] Date of Birth/Sex: Treating RN: 05-29-1934 (89 y.o. M) Primary Care Almir Botts: [name] Other Clinician: Referring Ruchy Wildrick: Treating Shae Hinnenkamp/Extender: [name] in Treatment: 6 Vital Signs Time Taken: 10:58 Temperature (F): 97.8 Height (in): 75 Pulse (bpm): 92 Weight (lbs): 238 Respiratory Rate (breaths/min): 18 Body Mass Index (BMI): 29.7 Blood Pressure (mmHg): 103/65 Reference Range: 80 - 120 mg / dl Electronic Signature(s) Signed: 07/08/2023 2:57:56 PM By: [name] Entered By: [name] on 07/08/2023 07:58:43

## 2023-07-08 NOTE — Progress Notes (Signed)
CATHY, FURY ([id number]) [id number] Page 1 of 8 Visit Report for 07/08/2023 Chief Complaint Document Details Patient Name: Date of Service: [name] [name]. 07/08/2023 11:15 A M Medical Record Number: [id number] Patient Account Number: [id number] Date of Birth/Sex: Treating RN: 1934/01/13 (89 y.o. M) Primary Care Provider: Tana Conch Other Clinician: Referring Provider: Treating Provider/Extender: Arvin Collard in Treatment: 6 Information Obtained from: Patient Chief Complaint 05/27/2023; patient comes in today with a traumatic wound on the left anterior lower leg Electronic Signature(s) Signed: 07/08/2023 11:24:01 AM By: [name] Entered By: Duanne Guess on 07/08/2023 11:24:00 -------------------------------------------------------------------------------- HPI Details Patient Name: Date of Service: [name], Richard V [name]. 07/08/2023 11:15 A M Medical Record Number: [id number] Patient Account Number: [id number] Date of Birth/Sex: Treating RN: March 02, 1934 (89 y.o. M) Primary Care Provider: Tana Conch Other Clinician: Referring Provider: Treating Provider/Extender: Arvin Collard in Treatment: 6 History of Present Illness HPI Description: ADMISSION 05/27/2023; The patient is a 89 year old man who traumatized his left leg on a bed frame in [place] on . The wound was initially repaired in the ER with 17 sutures. He has been seen twice in urgent care on 10/8. He was given mupirocin and doxycycline. He was again seen in urgent care on 10/10 felt to have cellulitis and had Cipro added to the doxycycline which she is still taking. The patient is on Eliquis for history of sick sinus syndrome and atrial fibrillation. The patient is not complaining of a lot of pain. He is able to show me pictures of progressive skin necrosis just lateral to the original wound. Past medical  history includes sick sinus syndrome, coronary artery disease, aortic stenosis, chronic kidney disease. He is on Eliquis ABI in our clinic at 1.04 on the right and 1.06 on the left 06/03/2023: The cellulitis is still fairly persistent. There are multiple sutures still intact but they are pulling through the soft tissue. There is an odor coming from the wound. 06/11/2023: The wound is much cleaner this week. There is no longer any odor. There is still a fair amount of slough present with eschar around the edges. The culture that I took last week returned with E. coli and staph species, sensitive to the Levaquin I had prescribed. He is currently taking this. 06/18/2023: The wound is very clean this week but the granulation tissue is quite hypertrophic. There is no odor. Edema control is good. The wound VAC is being delivered today. 06/25/2023: The wound has responded nicely to negative pressure wound therapy. It has contracted quite a bit. The hypertrophic granulation tissue has reaccumulated, however. 07/02/2023: The wound is smaller again this week. The hypertrophic granulation tissue has built up again. Edema control is good. 07/08/2023: The wound continues to contract. Edema control is good. The hypertrophic granulation tissue has reaccumulated, but not to the extent that it has previously. [name], [name] ([id number]) [id number] Page 2 of 8 Electronic Signature(s) Signed: 07/08/2023 11:24:29 AM By: [name] Entered By: Duanne Guess on 07/08/2023 11:24:29 -------------------------------------------------------------------------------- Chemical Cauterization Details Patient Name: Date of Service: [name], Richard V [name]. 07/08/2023 11:15 A M Medical Record Number: [id number] Patient Account Number: [id number] Date of Birth/Sex: Treating RN: [birth year] (89 y.o. [name] Primary Care Provider: Tana Conch Other Clinician: Referring  Provider: Treating Provider/Extender: Arvin Collard in Treatment: 6 Procedure Performed for: Wound #1 Left,Lateral Lower Leg Performed By: Physician Duanne Guess, MD The following information  was scribed by: Karie Schwalbe The information was scribed for: Duanne Guess Post Procedure Diagnosis Same as Pre-procedure Electronic Signature(s) Signed: 07/08/2023 11:59:44 AM By: [name] Signed: 07/08/2023 12:19:03 PM By: [name] Entered By: Karie Schwalbe on 07/08/2023 11:19:14 -------------------------------------------------------------------------------- Physical Exam Details Patient Name: Date of Service: [name], Richard V [name]. 07/08/2023 11:15 A M Medical Record Number: [id number] Patient Account Number: [id number] Date of Birth/Sex: Treating RN: November 15, 1933 (89 y.o. M) Primary Care Provider: Tana Conch Other Clinician: Referring Provider: Treating Provider/Extender: Arvin Collard in Treatment: 6 Constitutional . . . . no acute distress. Respiratory . Notes 07/08/2023: The wound continues to contract. Edema control is good. The hypertrophic granulation tissue has reaccumulated, but not to the extent that it has previously. Electronic Signature(s) Signed: 07/08/2023 11:24:54 AM By: [name] Entered By: Duanne Guess on 07/08/2023 11:24:54 [name], [name] ([id number]) [id number] Page 3 of 8 -------------------------------------------------------------------------------- Physician Orders Details Patient Name: Date of Service: [name] [name]. 07/08/2023 11:15 A M Medical Record Number: [id number] Patient Account Number: [id number] Date of Birth/Sex: Treating RN: 29-Feb-1934 (89 y.o. [name] Primary Care Provider: Tana Conch Other Clinician: Referring Provider: Treating Provider/Extender: Arvin Collard  in Treatment: 6 Verbal / Phone Orders: No Diagnosis Coding ICD-10 Coding Code Description [phone] Non-pressure chronic ulcer of other part of left lower leg with other specified severity S80.12XD Contusion of left lower leg, subsequent encounter L03.116 Cellulitis of left lower limb T45.515S Adverse effect of anticoagulants, sequela Follow-up Appointments ppointment in 1 week. - Dr. Lady Gary 07/30/23 at 11:15am Return A ppointment in: - 07/15/23 at 11am and 07/22/23 at 10:45am Return A Nurse Visit: - Mon 12/2 @ 11:00 am Mon 12/9 @ 10:45 am Anesthetic (In clinic) Topical Lidocaine 4% applied to wound bed Bathing/ Shower/ Hygiene May shower with protection but do not get wound dressing(s) wet. Protect dressing(s) with water repellant cover (for example, large plastic bag) or a cast cover and may then take shower. Negative Presssure Wound Therapy Wound Vac to wound continuously at 168mm/hg pressure - Peel and Place dressing wound Vac System Edema Control - Orders / Instructions Left Lower Extremity Elevate legs to the level of the heart or above for 30 minutes daily and/or when sitting for 3-4 times a day throughout the day. Avoid standing for long periods of time. Exercise regularly Lymphedema Treatment Plan - Exercise, Compression and Elevation Exercise daily as tolerated. (Walking, ROM, Calf Pumps and Toe Taps) Compression Wraps as ordered Elevate legs 30 - 60 minutes at or above heart level at least 3 - 4 times daily as able/tolerated Avoid standing for long periods and elevate leg(s) parallel to the floor when sitting Wound Treatment Wound #1 - Lower Leg Wound Laterality: Left, Lateral Cleanser: Soap and Water 1 x Per Week/30 Days Discharge Instructions: May shower and wash wound with dial antibacterial soap and water prior to dressing change. Cleanser: Wound Cleanser 1 x Per Week/30 Days Discharge Instructions: Cleanse the wound with wound cleanser prior to applying a clean  dressing using gauze sponges, not tissue or cotton balls. Peri-Wound Care: Sween Lotion (Moisturizing lotion) 1 x Per Week/30 Days Discharge Instructions: Apply moisturizing lotion as directed Prim Dressing: NPWT 1 x Per Week/30 Days ary Discharge Instructions: Peel and Place Wound Vac system Compression Wrap: Urgo K2 Lite, (equivalent to a 3 layer) two layer compression system, regular 1 x Per Week/30 Days Discharge Instructions: Apply Urgo K2 Lite as directed (  alternative to 3 layer compression). Electronic Signature(s) Signed: 07/08/2023 11:59:44 AM By: [name] Signed: 07/08/2023 12:19:03 PM By: [name] Entered By: Karie Schwalbe on 07/08/2023 11:43:17 [name], [name] ([id number]) [id number] Page 4 of 8 -------------------------------------------------------------------------------- Problem List Details Patient Name: Date of Service: [name] [name]. 07/08/2023 11:15 A M Medical Record Number: [id number] Patient Account Number: [id number] Date of Birth/Sex: Treating RN: [birth year] (89 y.o. M) Primary Care Provider: Tana Conch Other Clinician: Referring Provider: Treating Provider/Extender: Arvin Collard in Treatment: 6 Active Problems ICD-10 Encounter Code Description Active Date MDM Diagnosis L97.828 Non-pressure chronic ulcer of other part of left lower leg with other specified 05/27/2023 No Yes severity S80.12XD Contusion of left lower leg, subsequent encounter 05/27/2023 No Yes L03.116 Cellulitis of left lower limb 05/27/2023 No Yes T45.515S Adverse effect of anticoagulants, sequela 05/27/2023 No Yes Inactive Problems Resolved Problems Electronic Signature(s) Signed: 07/08/2023 11:23:45 AM By: [name] Entered By: Duanne Guess on 07/08/2023 11:23:45 -------------------------------------------------------------------------------- Progress Note Details Patient  Name: Date of Service: [name], Richard V [name]. 07/08/2023 11:15 A M Medical Record Number: [id number] Patient Account Number: [id number] Date of Birth/Sex: Treating RN: [birth year] (89 y.o. M) Primary Care Provider: Tana Conch Other Clinician: Referring Provider: Treating Provider/Extender: Arvin Collard in Treatment: 6 Subjective Chief Complaint Information obtained from Patient 05/27/2023; patient comes in today with a traumatic wound on the left anterior lower leg History of Present Illness (HPI) ADMISSION 05/27/2023; The patient is a 89 year old man who traumatized his left leg on a bed frame in [place] on . The wound was initially repaired in the ER with 17 sutures. TANDY, LUCKADOO ([id number]) [id number] Page 5 of 8 He has been seen twice in urgent care on 10/8. He was given mupirocin and doxycycline. He was again seen in urgent care on 10/10 felt to have cellulitis and had Cipro added to the doxycycline which she is still taking. The patient is on Eliquis for history of sick sinus syndrome and atrial fibrillation. The patient is not complaining of a lot of pain. He is able to show me pictures of progressive skin necrosis just lateral to the original wound. Past medical history includes sick sinus syndrome, coronary artery disease, aortic stenosis, chronic kidney disease. He is on Eliquis ABI in our clinic at 1.04 on the right and 1.06 on the left 06/03/2023: The cellulitis is still fairly persistent. There are multiple sutures still intact but they are pulling through the soft tissue. There is an odor coming from the wound. 06/11/2023: The wound is much cleaner this week. There is no longer any odor. There is still a fair amount of slough present with eschar around the edges. The culture that I took last week returned with E. coli and staph species, sensitive to the Levaquin I had prescribed. He is currently taking  this. 06/18/2023: The wound is very clean this week but the granulation tissue is quite hypertrophic. There is no odor. Edema control is good. The wound VAC is being delivered today. 06/25/2023: The wound has responded nicely to negative pressure wound therapy. It has contracted quite a bit. The hypertrophic granulation tissue has reaccumulated, however. 07/02/2023: The wound is smaller again this week. The hypertrophic granulation tissue has built up again. Edema control is good. 07/08/2023: The wound continues to contract. Edema control is good. The hypertrophic granulation tissue has reaccumulated, but not to the extent that it has previously. Patient History Information  obtained from Chart. Family History Cancer - Mother, Heart Disease - Father. Social History Never smoker, Alcohol Use - Never, Drug Use - No History. Medical History Cardiovascular Patient has history of Arrhythmia - a fib, Congestive Heart Failure, Coronary Artery Disease, Hypertension, Myocardial Infarction Hospitalization/Surgery History - cardiac cath. - cardiac angiogram. - angioplasty. - joint replacement. - knee arthroscopy. - tonsillectomy. - lap chole. - cataract surgery. - pilonidal cystectomy. Medical A Surgical History Notes nd Respiratory Pulmonary Hypertension Genitourinary BPH, CKD Objective Constitutional no acute distress. Vitals Time Taken: 10:58 AM, Height: 75 in, Weight: 238 lbs, BMI: 29.7, Temperature: 97.8 F, Pulse: 92 bpm, Respiratory Rate: 18 breaths/min, Blood Pressure: 103/65 mmHg. General Notes: 07/08/2023: The wound continues to contract. Edema control is good. The hypertrophic granulation tissue has reaccumulated, but not to the extent that it has previously. Integumentary (Hair, Skin) Wound #1 status is Open. Original cause of wound was Trauma. The date acquired was: 05/14/2023. The wound has been in treatment 6 weeks. The wound is located on the Left,Lateral Lower Leg. The wound  measures 9cm length x 2.7cm width x 0.1cm depth; 19.085cm^2 area and 1.909cm^3 volume. There is Fat Layer (Subcutaneous Tissue) exposed. There is no tunneling or undermining noted. There is a medium amount of serosanguineous drainage noted. The wound margin is flat and intact. There is large (67-100%) red granulation within the wound bed. There is a small (1-33%) amount of necrotic tissue within the wound bed including Adherent Slough. The periwound skin appearance had no abnormalities noted for texture. The periwound skin appearance had no abnormalities noted for moisture. The periwound skin appearance exhibited: Hemosiderin Staining. Periwound temperature was noted as No Abnormality. Assessment Active Problems ICD-10 Non-pressure chronic ulcer of other part of left lower leg with other specified severity Contusion of left lower leg, subsequent encounter Cellulitis of left lower limb [name], [name] ([id number]) [id number] Page 6 of 8 Adverse effect of anticoagulants, sequela Procedures Wound #1 Pre-procedure diagnosis of Wound #1 is a Trauma, Other located on the Left,Lateral Lower Leg . There was a Three Layer Compression Therapy Procedure by Karie Schwalbe, RN. Post procedure Diagnosis Wound #1: Same as Pre-Procedure Pre-procedure diagnosis of Wound #1 is a Trauma, Other located on the Left,Lateral Lower Leg . An Chemical Cauterization procedure was performed by Duanne Guess, MD. Post procedure Diagnosis Wound #1: Same as Pre-Procedure Plan 07/08/2023: The wound continues to contract. Edema control is good. The hypertrophic granulation tissue has reaccumulated, but not to the extent that it has previously. No debridement was necessary today. I chemically cauterized the hypertrophic granulation tissue using silver nitrate. We will continue negative pressure wound therapy and Urgo light compression. Follow-up in 1 week. Electronic Signature(s) Signed:  07/08/2023 11:25:42 AM By: [name] Entered By: Duanne Guess on 07/08/2023 11:25:42 -------------------------------------------------------------------------------- HxROS Details Patient Name: Date of Service: [name], Richard V [name]. 07/08/2023 11:15 A M Medical Record Number: [id number] Patient Account Number: [id number] Date of Birth/Sex: Treating RN: 1933-11-15 (89 y.o. M) Primary Care Provider: Tana Conch Other Clinician: Referring Provider: Treating Provider/Extender: Arvin Collard in Treatment: 6 Information Obtained From Chart Respiratory Medical History: Past Medical History Notes: Pulmonary Hypertension Cardiovascular Medical History: Positive for: Arrhythmia - a fib; Congestive Heart Failure; Coronary Artery Disease; Hypertension; Myocardial Infarction Genitourinary Medical History: Past Medical History Notes: BPH, CKD Immunizations Pneumococcal Vaccine: [name], [name] ([id number]) [id number] Page 7 of 8 Received Pneumococcal Vaccination: No Implantable Devices No devices added Hospitalization / Surgery History Type of Hospitalization/Surgery  cardiac cath cardiac angiogram angioplasty joint replacement knee arthroscopy tonsillectomy lap chole cataract surgery pilonidal cystectomy Family and Social History Cancer: Yes - Mother; Heart Disease: Yes - Father; Never smoker; Alcohol Use: Never; Drug Use: No History; Financial Concerns: No; Food, Clothing or Shelter Needs: No; Support System Lacking: No; Transportation Concerns: No Electronic Signature(s) Signed: 07/08/2023 12:19:03 PM By: [name] Entered By: Duanne Guess on 07/08/2023 11:24:34 -------------------------------------------------------------------------------- SuperBill Details Patient Name: Date of Service: [name], Richard V [name]. 07/08/2023 Medical Record Number: [id number] Patient Account Number:  [id number] Date of Birth/Sex: Treating RN: 06-08-1934 (89 y.o. M) Primary Care Provider: Tana Conch Other Clinician: Referring Provider: Treating Provider/Extender: Arvin Collard in Treatment: 6 Diagnosis Coding ICD-10 Codes Code Description [id number] Non-pressure chronic ulcer of other part of left lower leg with other specified severity S80.12XD Contusion of left lower leg, subsequent encounter L03.116 Cellulitis of left lower limb T45.515S Adverse effect of anticoagulants, sequela Facility Procedures : CPT4 Code: [id number] Description: 17250 - CHEM CAUT GRANULATION TISS ICD-10 Diagnosis Description L97.828 Non-pressure chronic ulcer of other part of left lower leg with other specified s Modifier: everity Quantity: 1 Physician Procedures : CPT4 Code Description Modifier [id number] - WC PHYS LEVEL 4 - EST PT 25 ICD-10 Diagnosis Description L97.828 Non-pressure chronic ulcer of other part of left lower leg with other specified severity S80.12XD Contusion of left lower leg, subsequent  encounter T45.515S Adverse effect of anticoagulants, sequela Quantity: 1 : [id number] 17250 - WC PHYS CHEM CAUT GRAN TISSUE ICD-10 Diagnosis Description L97.828 Non-pressure chronic ulcer of other part of left lower leg with other specified severity [name], [name] ([id number]) [id number] Pa Quantity: 1 ge 8 of 8 Electronic Signature(s) Signed: 07/08/2023 11:26:35 AM By: [name] Entered By: Duanne Guess on 07/08/2023 11:26:35

## 2023-07-15 ENCOUNTER — Encounter (HOSPITAL_BASED_OUTPATIENT_CLINIC_OR_DEPARTMENT_OTHER): Payer: Medicare Other | Attending: Internal Medicine | Admitting: Internal Medicine

## 2023-07-15 DIAGNOSIS — S8012XD Contusion of left lower leg, subsequent encounter: Secondary | ICD-10-CM | POA: Diagnosis not present

## 2023-07-15 DIAGNOSIS — W2203XS Walked into furniture, sequela: Secondary | ICD-10-CM | POA: Diagnosis not present

## 2023-07-15 DIAGNOSIS — I495 Sick sinus syndrome: Secondary | ICD-10-CM | POA: Insufficient documentation

## 2023-07-15 DIAGNOSIS — I129 Hypertensive chronic kidney disease with stage 1 through stage 4 chronic kidney disease, or unspecified chronic kidney disease: Secondary | ICD-10-CM | POA: Insufficient documentation

## 2023-07-15 DIAGNOSIS — N189 Chronic kidney disease, unspecified: Secondary | ICD-10-CM | POA: Diagnosis not present

## 2023-07-15 DIAGNOSIS — L97828 Non-pressure chronic ulcer of other part of left lower leg with other specified severity: Secondary | ICD-10-CM | POA: Diagnosis not present

## 2023-07-15 DIAGNOSIS — I4891 Unspecified atrial fibrillation: Secondary | ICD-10-CM | POA: Insufficient documentation

## 2023-07-15 DIAGNOSIS — Z7901 Long term (current) use of anticoagulants: Secondary | ICD-10-CM | POA: Diagnosis not present

## 2023-07-15 DIAGNOSIS — I2510 Atherosclerotic heart disease of native coronary artery without angina pectoris: Secondary | ICD-10-CM | POA: Insufficient documentation

## 2023-07-15 DIAGNOSIS — T45515S Adverse effect of anticoagulants, sequela: Secondary | ICD-10-CM | POA: Insufficient documentation

## 2023-07-15 DIAGNOSIS — I350 Nonrheumatic aortic (valve) stenosis: Secondary | ICD-10-CM | POA: Insufficient documentation

## 2023-07-15 NOTE — Progress Notes (Signed)
NOLLIE, DIPIRRO ([id number]) [id number] Page 1 of 4 Visit Report for 07/15/2023 Arrival Information Details Patient Name: Date of Service: [name] [name]. 07/15/2023 11:00 A M Medical Record Number: [id number] Patient Account Number: [id number] Date of Birth/Sex: Treating RN: 19-Mar-1934 (89 y.o. M) Primary Care Alleyah Twombly: Tana Conch Other Clinician: Thayer Dallas Referring Nikkol Pai: Treating Jennefer Kopp/Extender: Jaquelyn Bitter in Treatment: 7 Visit Information History Since Last Visit Added or deleted any medications: No Patient Arrived: Ambulatory Any new allergies or adverse reactions: No Arrival Time: 10:55 Had a fall or experienced change in No Accompanied By: self activities of daily living that may affect Transfer Assistance: None risk of falls: Patient Identification Verified: Yes Signs or symptoms of abuse/neglect since last visito No Secondary Verification Process Completed: Yes Hospitalized since last visit: No Patient Requires Transmission-Based Precautions: No Implantable device outside of the clinic excluding No Patient Has Alerts: No cellular tissue based products placed in the center since last visit: Has Dressing in Place as Prescribed: Yes Has Compression in Place as Prescribed: Yes Pain Present Now: No Electronic Signature(s) Signed: 07/15/2023 3:18:23 PM By: Thayer Dallas Entered By: Thayer Dallas on 07/15/2023 12:04:08 -------------------------------------------------------------------------------- Compression Therapy Details Patient Name: Date of Service: [name], [name]. 07/15/2023 11:00 A M Medical Record Number: [id number] Patient Account Number: [id number] Date of Birth/Sex: Treating RN: 05-15-1934 (89 y.o. M) Primary Care Kayde Atkerson: Tana Conch Other Clinician: Referring Jakylah Bassinger: Treating Nathanyl Andujo/Extender: Jaquelyn Bitter in Treatment: 7 Compression Therapy  Performed for Wound Assessment: Wound #1 Left,Lateral Lower Leg Performed By: Clinician Thayer Dallas, Compression Type: Double Layer Electronic Signature(s) Signed: 07/15/2023 3:18:23 PM By: Thayer Dallas Entered By: Thayer Dallas on 07/15/2023 12:05:48 -------------------------------------------------------------------------------- Encounter Discharge Information Details Patient Name: Date of Service: [name], [name]. 07/15/2023 11:00 A M Medical Record Number: [id number] Patient Account Number: [id number] [name], [name] ([id number]) [phone].pdf Page 2 of 4 Date of Birth/Sex: Treating RN: [birth year] (89 y.o. M) Primary Care Hisao Doo: Other Clinician: Stanton Kidney Referring Sahmya Arai: Treating Tuyen Uncapher/Extender: Jaquelyn Bitter in Treatment: 7 Encounter Discharge Information Items Discharge Condition: Stable Ambulatory Status: Ambulatory Discharge Destination: Home Transportation: Private Auto Accompanied By: self Schedule Follow-up Appointment: Yes Clinical Summary of Care: Electronic Signature(s) Signed: 07/15/2023 3:18:23 PM By: Thayer Dallas Entered By: Thayer Dallas on 07/15/2023 12:06:29 -------------------------------------------------------------------------------- Negative Pressure Wound Therapy Maintenance (NPWT) Details Patient Name: Date of Service: [name] [name]. 07/15/2023 11:00 A M Medical Record Number: [id number] Patient Account Number: [id number] Date of Birth/Sex: Treating RN: 1933/09/22 (89 y.o. M) Primary Care Aisia Correira: Tana Conch Other Clinician: Referring Shereece Wellborn: Treating Georgeann Brinkman/Extender: Jaquelyn Bitter in Treatment: 7 NPWT Maintenance Performed for: Wound #1 Left, Lateral Lower Leg Performed By: Thayer Dallas, Type: VAC System Coverage Size (sq cm): 24.3 Pressure Type: Constant Pressure Setting: 125 mmHG Drain Type: None Primary Contact:  None Sponge/Dressing Type: Foam- Black Date Initiated: 06/21/2023 Dressing Removed: Yes Quantity of Sponges/Gauze Removed: 1 black foam Canister Changed: Yes Canister Exudate Volume: 30 Dressing Reapplied: Yes Quantity of Sponges/Gauze Inserted: 1 black foam Respones T Treatment: o tolerated well Days On NPWT : 25 Electronic Signature(s) Signed: 07/15/2023 3:18:23 PM By: Thayer Dallas Entered By: Thayer Dallas on 07/15/2023 12:05:31 -------------------------------------------------------------------------------- Patient/Caregiver Education Details Patient Name: Date of Service: [name] ID Rexene Edison 12/2/2024andnbsp11:00 A M Medical Record Number: [id number] Patient Account Number: [id number] Date of Birth/Gender: Treating RN: 08-Dec-1933 (89 y.o. M) Primary Care Physician: Durene Cal,  Jeannett Senior Other Clinician: Thayer Dallas Referring Physician: Treating Physician/Extender: Jaquelyn Bitter in Treatment: [address] Mandrell [name] ([id number]) [id number] Page 3 of 4 Education Assessment Education Provided To: Patient Education Topics Provided Psychologist, prison and probation services) Signed: 07/15/2023 3:18:23 PM By: Thayer Dallas Entered By: Thayer Dallas on 07/15/2023 12:06:10 -------------------------------------------------------------------------------- Wound Assessment Details Patient Name: Date of Service: [name], [name]. 07/15/2023 11:00 A M Medical Record Number: [id number] Patient Account Number: [id number] Date of Birth/Sex: Treating RN: 12-20-1933 (89 y.o. M) Primary Care Gurnie Duris: Tana Conch Other Clinician: Referring Johnnette Laux: Treating Chrislynn Mosely/Extender: Jaquelyn Bitter in Treatment: 7 Wound Status Wound Number: 1 Primary Etiology: Trauma, Other Wound Location: Left, Lateral Lower Leg Wound Status: Open Wounding Event: Trauma Date Acquired: 05/14/2023 Weeks Of Treatment: 7 Clustered Wound: No Wound  Measurements Length: (cm) 9 Width: (cm) 2.7 Depth: (cm) 0.1 Area: (cm) 19.085 Volume: (cm) 1.909 % Reduction in Area: 65.8% % Reduction in Volume: 82.9% Wound Description Classification: Full Thickness Without Exposed Suppor Exudate Amount: Medium Exudate Type: Serosanguineous Exudate Color: red, brown t Structures Periwound Skin Texture Texture Color No Abnormalities Noted: No No Abnormalities Noted: No Moisture No Abnormalities Noted: No Treatment Notes Wound #1 (Lower Leg) Wound Laterality: Left, Lateral Cleanser Soap and Water Discharge Instruction: May shower and wash wound with dial antibacterial soap and water prior to dressing change. Wound Cleanser Discharge Instruction: Cleanse the wound with wound cleanser prior to applying a clean dressing using gauze sponges, not tissue or cotton balls. Peri-Wound Care Sween Lotion (Moisturizing lotion) Discharge Instruction: Apply moisturizing lotion as directed Topical Wyche, Oluwatosin [name] ([id number]) [id number] Page 4 of 4 Primary Dressing NPWT Discharge Instruction: Peel and Place Wound Vac system Secondary Dressing Secured With Compression Wrap Urgo K2 Lite, (equivalent to a 3 layer) two layer compression system, regular Discharge Instruction: Apply Urgo K2 Lite as directed (alternative to 3 layer compression). Compression Stockings Add-Ons Electronic Signature(s) Signed: 07/15/2023 3:18:23 PM By: Thayer Dallas Entered By: Thayer Dallas on 07/15/2023 12:04:26 -------------------------------------------------------------------------------- Vitals Details Patient Name: Date of Service: [name], [name]. 07/15/2023 11:00 A M Medical Record Number: [id number] Patient Account Number: [id number] Date of Birth/Sex: Treating RN: Jan 17, 1934 (89 y.o. M) Primary Care Sultan Pargas: Tana Conch Other Clinician: Referring Coleta Grosshans: Treating Chantale Leugers/Extender: Jaquelyn Bitter in  Treatment: 7 Vital Signs Time Taken: 11:00 Reference Range: 80 - 120 mg / dl Height (in): 75 Weight (lbs): 238 Body Mass Index (BMI): 29.7 Electronic Signature(s) Signed: 07/15/2023 3:18:23 PM By: Thayer Dallas Entered By: Thayer Dallas on 07/15/2023 12:04:18

## 2023-07-16 ENCOUNTER — Encounter (HOSPITAL_BASED_OUTPATIENT_CLINIC_OR_DEPARTMENT_OTHER): Payer: Medicare Other | Admitting: Internal Medicine

## 2023-07-16 ENCOUNTER — Ambulatory Visit (INDEPENDENT_AMBULATORY_CARE_PROVIDER_SITE_OTHER): Payer: Medicare Other

## 2023-07-16 DIAGNOSIS — I495 Sick sinus syndrome: Secondary | ICD-10-CM | POA: Diagnosis not present

## 2023-07-16 LAB — CUP PACEART REMOTE DEVICE CHECK
Battery Remaining Longevity: 45 mo
Battery Voltage: 2.88 V
Brady Statistic AP VP Percent: 82.7 %
Brady Statistic AP VS Percent: 16.55 %
Brady Statistic AS VP Percent: 0.12 %
Brady Statistic AS VS Percent: 0.63 %
Brady Statistic RA Percent Paced: 99.82 %
Brady Statistic RV Percent Paced: 82.82 %
Date Time Interrogation Session: 20241130194019
Implantable Lead Connection Status: 753985
Implantable Lead Connection Status: 753985
Implantable Lead Implant Date: 20190116
Implantable Lead Implant Date: 20190116
Implantable Lead Location: 753859
Implantable Lead Location: 753860
Implantable Lead Model: 5076
Implantable Lead Model: 5076
Implantable Pulse Generator Implant Date: 20190116
Lead Channel Impedance Value: 285 Ohm
Lead Channel Impedance Value: 304 Ohm
Lead Channel Impedance Value: 342 Ohm
Lead Channel Impedance Value: 399 Ohm
Lead Channel Pacing Threshold Amplitude: 1 V
Lead Channel Pacing Threshold Amplitude: 1.125 V
Lead Channel Pacing Threshold Pulse Width: 0.4 ms
Lead Channel Pacing Threshold Pulse Width: 0.4 ms
Lead Channel Sensing Intrinsic Amplitude: 0.125 mV
Lead Channel Sensing Intrinsic Amplitude: 0.125 mV
Lead Channel Sensing Intrinsic Amplitude: 10.5 mV
Lead Channel Sensing Intrinsic Amplitude: 10.5 mV
Lead Channel Setting Pacing Amplitude: 2 V
Lead Channel Setting Pacing Amplitude: 2.25 V
Lead Channel Setting Pacing Pulse Width: 0.4 ms
Lead Channel Setting Sensing Sensitivity: 1.2 mV
Zone Setting Status: 755011
Zone Setting Status: 755011

## 2023-07-16 NOTE — Progress Notes (Signed)
ADDIE, SISTARE ([id number]) [id number] Page 1 of 1 Visit Report for 07/15/2023 SuperBill Details Patient Name: Date of Service: [name] 07/15/2023 Medical Record Number: [id number] Patient Account Number: [id number] Date of Birth/Sex: Treating RN: [birth year] (89 y.o. M) Primary Care Provider: Tana Conch Other Clinician: Thayer Dallas Referring Provider: Treating Provider/Extender: Jaquelyn Bitter in Treatment: 7 Diagnosis Coding ICD-10 Codes Code Description [phone] Non-pressure chronic ulcer of other part of left lower leg with other specified severity S80.12XD Contusion of left lower leg, subsequent encounter L03.116 Cellulitis of left lower limb T45.515S Adverse effect of anticoagulants, sequela Facility Procedures CPT4 Code Description Modifier Quantity [id number] 97605 - WOUND VAC-50 SQ CM OR LESS 1 [id number] (Facility Use Only) 29581LT - APPLY MULTLAY COMPRS LWR LT LEG 1 Electronic Signature(s) Signed: 07/15/2023 3:18:23 PM By: Thayer Dallas Signed: 07/15/2023 5:24:09 PM By: Baltazar Najjar MD Entered By: Thayer Dallas on 07/15/2023 12:07:08

## 2023-07-22 ENCOUNTER — Encounter (HOSPITAL_BASED_OUTPATIENT_CLINIC_OR_DEPARTMENT_OTHER): Payer: Medicare Other | Admitting: Internal Medicine

## 2023-07-22 DIAGNOSIS — I2510 Atherosclerotic heart disease of native coronary artery without angina pectoris: Secondary | ICD-10-CM | POA: Diagnosis not present

## 2023-07-22 DIAGNOSIS — I129 Hypertensive chronic kidney disease with stage 1 through stage 4 chronic kidney disease, or unspecified chronic kidney disease: Secondary | ICD-10-CM | POA: Diagnosis not present

## 2023-07-22 DIAGNOSIS — I495 Sick sinus syndrome: Secondary | ICD-10-CM | POA: Diagnosis not present

## 2023-07-22 DIAGNOSIS — Z7901 Long term (current) use of anticoagulants: Secondary | ICD-10-CM | POA: Diagnosis not present

## 2023-07-22 DIAGNOSIS — N189 Chronic kidney disease, unspecified: Secondary | ICD-10-CM | POA: Diagnosis not present

## 2023-07-22 DIAGNOSIS — L97828 Non-pressure chronic ulcer of other part of left lower leg with other specified severity: Secondary | ICD-10-CM | POA: Diagnosis not present

## 2023-07-22 NOTE — Progress Notes (Signed)
KORDAI, SKEMP ([id number]) [id number] Page 1 of 4 Visit Report for 07/22/2023 Arrival Information Details Patient Name: Date of Service: [name] ID H. 07/22/2023 10:45 A M Medical Record Number: [id number] Patient Account Number: [id number] Date of Birth/Sex: Treating RN: 01-25-1934 (89 y.o. M) Primary Care Alaria Oconnor: Tana Conch Other Clinician: Thayer Dallas Referring Lorry Furber: Treating Haden Suder/Extender: Jaquelyn Bitter in Treatment: 8 Visit Information History Since Last Visit Added or deleted any medications: No Patient Arrived: Ambulatory Any new allergies or adverse reactions: No Arrival Time: 10:30 Had a fall or experienced change in No Accompanied By: self activities of daily living that may affect Transfer Assistance: None risk of falls: Patient Identification Verified: Yes Signs or symptoms of abuse/neglect since last visito No Secondary Verification Process Completed: Yes Hospitalized since last visit: No Patient Requires Transmission-Based Precautions: No Implantable device outside of the clinic excluding No Patient Has Alerts: No cellular tissue based products placed in the center since last visit: Has Dressing in Place as Prescribed: Yes Has Compression in Place as Prescribed: Yes Pain Present Now: No Electronic Signature(s) Signed: 07/22/2023 4:12:01 PM By: Thayer Dallas Entered By: Thayer Dallas on 07/22/2023 10:38:03 -------------------------------------------------------------------------------- Compression Therapy Details Patient Name: Date of Service: Richard RRIS, [name] 07/22/2023 10:45 A M Medical Record Number: [id number] Patient Account Number: [id number] Date of Birth/Sex: Treating RN: [birth year] (89 y.o. M) Primary Care Eileen Kangas: Tana Conch Other Clinician: Referring Ladonya Jerkins: Treating Senai Kingsley/Extender: Jaquelyn Bitter in Treatment: 8 Compression Therapy  Performed for Wound Assessment: Wound #1 Left,Lateral Lower Leg Performed By: Clinician Thayer Dallas, Compression Type: Double Layer Electronic Signature(s) Signed: 07/22/2023 4:12:01 PM By: Thayer Dallas Entered By: Thayer Dallas on 07/22/2023 11:04:31 -------------------------------------------------------------------------------- Encounter Discharge Information Details Patient Name: Date of Service: Richard RRIS, [name] 07/22/2023 10:45 A M Medical Record Number: [id number] Patient Account Number: [id number] [name] ([id number]) [id number] Page 2 of 4 Date of Birth/Sex: Treating RN: 1934-07-03 (89 y.o. M) Primary Care Hrithik Boschee: Other Clinician: Stanton Kidney Referring Aevah Stansbery: Treating Alaze Garverick/Extender: Jaquelyn Bitter in Treatment: 8 Encounter Discharge Information Items Discharge Condition: Stable Ambulatory Status: Ambulatory Discharge Destination: Home Transportation: Private Auto Accompanied By: self Schedule Follow-up Appointment: Yes Clinical Summary of Care: Electronic Signature(s) Signed: 07/22/2023 4:12:01 PM By: Thayer Dallas Entered By: Thayer Dallas on 07/22/2023 11:05:54 -------------------------------------------------------------------------------- Negative Pressure Wound Therapy Maintenance (NPWT) Details Patient Name: Date of Service: [name] ID H. 07/22/2023 10:45 A M Medical Record Number: [id number] Patient Account Number: [id number] Date of Birth/Sex: Treating RN: 07-Mar-1934 (89 y.o. M) Primary Care Philippa Vessey: Tana Conch Other Clinician: Referring Venesha Petraitis: Treating Crystalyn Delia/Extender: Jaquelyn Bitter in Treatment: 8 NPWT Maintenance Performed for: Wound #1 Left, Lateral Lower Leg Performed By: Thayer Dallas, Type: VAC System Coverage Size (sq cm): 24.3 Pressure Type: Constant Pressure Setting: 125 mmHG Drain Type: None Primary Contact:  None Sponge/Dressing Type: Foam- Black Date Initiated: 06/21/2023 Dressing Removed: Yes Quantity of Sponges/Gauze Removed: 1 black foam Canister Changed: No Canister Exudate Volume: 10 Dressing Reapplied: Yes Quantity of Sponges/Gauze Inserted: 1 black Respones T Treatment: o tolerated well Days On NPWT : 32 Electronic Signature(s) Signed: 07/22/2023 4:12:01 PM By: Thayer Dallas Entered By: Thayer Dallas on 07/22/2023 11:04:16 -------------------------------------------------------------------------------- Patient/Caregiver Education Details Patient Name: Date of Service: [name] ID H. 12/9/2024andnbsp10:45 A M Medical Record Number: [id number] Patient Account Number: [id number] Date of Birth/Gender: Treating RN: 16-Jan-1934 (89 y.o. M) Primary Care Physician: Tana Conch  Other Clinician: Thayer Dallas Referring Physician: Treating Physician/Extender: Jaquelyn Bitter in Treatment: [address] [place] H ([id number]) [id number] Page 3 of 4 Education Assessment Education Provided To: Patient Education Topics Provided Psychologist, prison and probation services) Signed: 07/22/2023 4:12:01 PM By: Thayer Dallas Entered By: Thayer Dallas on 07/22/2023 11:05:39 -------------------------------------------------------------------------------- Wound Assessment Details Patient Name: Date of Service: Richard RRIS, [name] 07/22/2023 10:45 A M Medical Record Number: [id number] Patient Account Number: [id number] Date of Birth/Sex: Treating RN: 08-21-1933 (89 y.o. M) Primary Care Jaimin Krupka: Tana Conch Other Clinician: Referring Safiyya Stokes: Treating Dimitriy Carreras/Extender: Jaquelyn Bitter in Treatment: 8 Wound Status Wound Number: 1 Primary Etiology: Trauma, Other Wound Location: Left, Lateral Lower Leg Wound Status: Open Wounding Event: Trauma Date Acquired: 05/14/2023 Weeks Of Treatment: 8 Clustered Wound: No Wound  Measurements Length: (cm) 9 Width: (cm) 2.7 Depth: (cm) 0.1 Area: (cm) 19.085 Volume: (cm) 1.909 % Reduction in Area: 65.8% % Reduction in Volume: 82.9% Wound Description Classification: Full Thickness Without Exposed Suppor Exudate Amount: Medium Exudate Type: Serosanguineous Exudate Color: red, brown t Structures Periwound Skin Texture Texture Color No Abnormalities Noted: No No Abnormalities Noted: No Moisture No Abnormalities Noted: No Treatment Notes Wound #1 (Lower Leg) Wound Laterality: Left, Lateral Cleanser Soap and Water Discharge Instruction: May shower and wash wound with dial antibacterial soap and water prior to dressing change. Wound Cleanser Discharge Instruction: Cleanse the wound with wound cleanser prior to applying a clean dressing using gauze sponges, not tissue or cotton balls. Peri-Wound Care Sween Lotion (Moisturizing lotion) Discharge Instruction: Apply moisturizing lotion as directed Topical Lorenzen, Nathanyl H ([id number]) [id number] Page 4 of 4 Primary Dressing NPWT Discharge Instruction: Peel and Place Wound Vac system Secondary Dressing Secured With Compression Wrap Urgo K2 Lite, (equivalent to a 3 layer) two layer compression system, regular Discharge Instruction: Apply Urgo K2 Lite as directed (alternative to 3 layer compression). Compression Stockings Add-Ons Electronic Signature(s) Signed: 07/22/2023 4:12:01 PM By: Thayer Dallas Entered By: Thayer Dallas on 07/22/2023 10:38:27 -------------------------------------------------------------------------------- Vitals Details Patient Name: Date of Service: Richard RRIS, [name] 07/22/2023 10:45 A M Medical Record Number: [id number] Patient Account Number: [id number] Date of Birth/Sex: Treating RN: [birth year] (89 y.o. M) Primary Care Zay Yeargan: Tana Conch Other Clinician: Referring Laderius Valbuena: Treating Bradi Arbuthnot/Extender: Jaquelyn Bitter in  Treatment: 8 Vital Signs Time Taken: 10:38 Reference Range: 80 - 120 mg / dl Height (in): 75 Weight (lbs): 238 Body Mass Index (BMI): 29.7 Electronic Signature(s) Signed: 07/22/2023 4:12:01 PM By: Thayer Dallas Entered By: Thayer Dallas on 07/22/2023 10:38:18

## 2023-07-22 NOTE — Progress Notes (Signed)
TOMMEY, OUIMETTE ([id number]) [id number] Page 1 of 1 Visit Report for 07/22/2023 SuperBill Details Patient Name: Date of Service: [name] 07/22/2023 Medical Record Number: [id number] Patient Account Number: [id number] Date of Birth/Sex: Treating RN: 08/16/1933 (89 y.o. M) Primary Care Provider: Tana Conch Other Clinician: Referring Provider: Treating Provider/Extender: Jaquelyn Bitter in Treatment: 8 Diagnosis Coding ICD-10 Codes Code Description [phone] Non-pressure chronic ulcer of other part of left lower leg with other specified severity S80.12XD Contusion of left lower leg, subsequent encounter L03.116 Cellulitis of left lower limb T45.515S Adverse effect of anticoagulants, sequela Facility Procedures CPT4 Code Description Modifier Quantity [id number] 97605 - WOUND VAC-50 SQ CM OR LESS 1 [id number] (Facility Use Only) 29581LT - APPLY MULTLAY COMPRS LWR LT LEG 1 Electronic Signature(s) Signed: 07/22/2023 4:12:01 PM By: Thayer Dallas Signed: 07/22/2023 4:43:54 PM By: Baltazar Najjar MD Entered By: Thayer Dallas on 07/22/2023 11:06:40

## 2023-07-30 ENCOUNTER — Encounter (HOSPITAL_BASED_OUTPATIENT_CLINIC_OR_DEPARTMENT_OTHER): Payer: Medicare Other | Admitting: General Surgery

## 2023-07-30 DIAGNOSIS — I2510 Atherosclerotic heart disease of native coronary artery without angina pectoris: Secondary | ICD-10-CM | POA: Diagnosis not present

## 2023-07-30 DIAGNOSIS — Z7901 Long term (current) use of anticoagulants: Secondary | ICD-10-CM | POA: Diagnosis not present

## 2023-07-30 DIAGNOSIS — N189 Chronic kidney disease, unspecified: Secondary | ICD-10-CM | POA: Diagnosis not present

## 2023-07-30 DIAGNOSIS — I495 Sick sinus syndrome: Secondary | ICD-10-CM | POA: Diagnosis not present

## 2023-07-30 DIAGNOSIS — L97828 Non-pressure chronic ulcer of other part of left lower leg with other specified severity: Secondary | ICD-10-CM | POA: Diagnosis not present

## 2023-07-30 DIAGNOSIS — I129 Hypertensive chronic kidney disease with stage 1 through stage 4 chronic kidney disease, or unspecified chronic kidney disease: Secondary | ICD-10-CM | POA: Diagnosis not present

## 2023-07-30 DIAGNOSIS — S81802A Unspecified open wound, left lower leg, initial encounter: Secondary | ICD-10-CM | POA: Diagnosis not present

## 2023-07-31 NOTE — Progress Notes (Signed)
[name], [name] ([id number]) [id number] Page 1 of 7 Visit Report for 07/30/2023 Arrival Information Details Patient Name: Date of Service: [name] [name]. 07/30/2023 11:15 A M Medical Record Number: [id number] Patient Account Number: [id number] Date of Birth/Sex: Treating RN: 06-02-1934 (89 y.o. M) Primary Care [name]: [name] Other Clinician: Referring [name]: Treating [name]/Extender: [name] in Treatment: 9 Visit Information History Since Last Visit Added or deleted any medications: No Patient Arrived: Ambulatory Any new allergies or adverse reactions: No Arrival Time: 10:54 Had a fall or experienced change in No Accompanied By: self activities of daily living that may affect Transfer Assistance: None risk of falls: Patient Requires Transmission-Based Precautions: No Signs or symptoms of abuse/neglect since last visito No Patient Has Alerts: No Hospitalized since last visit: No Implantable device outside of the clinic excluding No cellular tissue based products placed in the center since last visit: Has Dressing in Place as Prescribed: Yes Has Compression in Place as Prescribed: Yes Pain Present Now: No Electronic Signature(s) Signed: 07/30/2023 5:11:40 PM By: [name] RN, BSN Entered By: [name] on 07/30/2023 11:17:54 -------------------------------------------------------------------------------- Compression Therapy Details Patient Name: Date of Service: [name], [name]. 07/30/2023 11:15 A M Medical Record Number: [id number] Patient Account Number: [id number] Date of Birth/Sex: Treating RN: Jan 18, 1934 (89 y.o. [name] Primary Care [name]: [name] Other Clinician: Referring [name]: Treating [name]/Extender: [name] in Treatment: 9 Compression Therapy Performed for Wound Assessment: Wound #1 Left,Lateral Lower  Leg Performed By: Clinician [name], RN Compression Type: Double Layer Post Procedure Diagnosis Same as Pre-procedure Notes urgo lite Electronic Signature(s) Signed: 07/30/2023 5:11:40 PM By: [name] RN, BSN Entered By: [name] on 07/30/2023 11:25:57 [name], [name] ([id number]) [id number] Page 2 of 7 -------------------------------------------------------------------------------- Encounter Discharge Information Details Patient Name: Date of Service: [name] 07/30/2023 11:15 A M Medical Record Number: [id number] Patient Account Number: [id number] Date of Birth/Sex: Treating RN: 1933-09-12 (89 y.o. [name] Primary Care [name]: [name] Other Clinician: Referring [name]: Treating [name]/Extender: [name] in Treatment: 9 Encounter Discharge Information Items Post Procedure Vitals Discharge Condition: Stable Temperature (F): 97.7 Ambulatory Status: Ambulatory Pulse (bpm): 70 Discharge Destination: Home Respiratory Rate (breaths/min): 18 Transportation: Private Auto Blood Pressure (mmHg): 118/73 Accompanied By: self Schedule Follow-up Appointment: Yes Clinical Summary of Care: Patient Declined Electronic Signature(s) Signed: 07/30/2023 5:11:40 PM By: [name] RN, BSN Entered By: [name] on 07/30/2023 11:42:54 -------------------------------------------------------------------------------- Lower Extremity Assessment Details Patient Name: Date of Service: [name], [name]. 07/30/2023 11:15 A M Medical Record Number: [id number] Patient Account Number: [id number] Date of Birth/Sex: Treating RN: 02-12-1934 (89 y.o. [name] Primary Care [name]: [name] Other Clinician: Referring [name]: Treating [name]/Extender: [name] in Treatment: 9 Edema Assessment Assessed: [name]: No] [Right: No] Edema:  [Left: Ye] [Right: s] Calf Left: Right: Point of Measurement: From Medial Instep 38.6 cm Ankle Left: Right: Point of Measurement: From Medial Instep 24.7 cm Vascular Assessment Pulses: Dorsalis Pedis Palpable: [Left:Yes] Extremity colors, hair growth, and conditions: Extremity Color: [Left:Hyperpigmented] Hair Growth on Extremity: [Left:No] Temperature of Extremity: [Left:Warm] Capillary Refill: [Left:< 3 seconds] Dependent Rubor: [Left:No No] Electronic Signature(s) [name], [name] ([id number]) [id number] Page 3 of 7 Signed: 07/30/2023 5:11:40 PM By: [name] RN, BSN Entered By: [name] on 07/30/2023 11:19:49 -------------------------------------------------------------------------------- Multi Wound Chart Details Patient Name: Date of Service: [name], Richard V ID  [name]. 07/30/2023 11:15 A M Medical Record Number: [id number] Patient Account Number: [id number] Date of Birth/Sex: Treating RN: 09-May-1934 (89 y.o. M) Primary Care Dala Breault: [name] Other Clinician: Referring [name]: Treating [name]/Extender: [name] in Treatment: 9 Vital Signs Height(in): 75 Pulse(bpm): 70 Weight(lbs): 238 Blood Pressure(mmHg): 118/73 Body Mass Index(BMI): 29.7 Temperature(F): 97.7 Respiratory Rate(breaths/min): 18 [1:Photos:] [N/A:N/A] Left, Lateral Lower Leg N/A N/A Wound Location: Trauma N/A N/A Wounding Event: Trauma, Other N/A N/A Primary Etiology: Arrhythmia, Congestive Heart Failure, N/A N/A Comorbid History: Coronary Artery Disease, Hypertension, Myocardial Infarction 05/14/2023 N/A N/A Date Acquired: 9 N/A N/A Weeks of Treatment: Open N/A N/A Wound Status: No N/A N/A Wound Recurrence: 1.5x2x0.1 N/A N/A Measurements L x W x D (cm) 2.356 N/A N/A A (cm) : rea 0.236 N/A N/A Volume (cm) : 95.80% N/A N/A % Reduction in Area: 97.90% N/A N/A % Reduction in Volume: Full Thickness Without  Exposed N/A N/A Classification: Support Structures Medium N/A N/A Exudate Amount: Serosanguineous N/A N/A Exudate Type: red, brown N/A N/A Exudate Color: Treatment Notes Electronic Signature(s) Signed: 07/30/2023 12:03:42 PM By: [name] Entered By: Duanne Guess on 07/30/2023 11:11:11 Multi-Disciplinary Care Plan Details -------------------------------------------------------------------------------- [name] ([id number]) [id number] Page 4 of 7 Patient Name: Date of Service: [name] [name]. 07/30/2023 11:15 A M Medical Record Number: [id number] Patient Account Number: [id number] Date of Birth/Sex: Treating RN: 02-28-1934 (89 y.o. [name] Primary Care Ulis Kaps: [name] Other Clinician: Referring Alecsander Hattabaugh: Treating Adalberto Metzgar/Extender: [name] in Treatment: 9 Multidisciplinary Care Plan reviewed with physician Active Inactive Venous Leg Ulcer Nursing Diagnoses: Knowledge deficit related to disease process and management Potential for venous Insuffiency (use before diagnosis confirmed) Goals: Patient will maintain optimal edema control Date Initiated: 06/18/2023 Target Resolution Date: 09/13/2023 Goal Status: Active Interventions: Assess peripheral edema status every visit. Compression as ordered Treatment Activities: Therapeutic compression applied : 06/18/2023 Notes: Wound/Skin Impairment Nursing Diagnoses: Knowledge deficit related to smoking impact on wound healing Goals: Patient/caregiver will verbalize understanding of skin care regimen Date Initiated: 05/27/2023 Target Resolution Date: 09/13/2023 Goal Status: Active Interventions: Assess patient/caregiver ability to obtain necessary supplies Assess patient/caregiver ability to perform ulcer/skin care regimen upon admission and as needed Assess ulceration(s) every visit Provide education on ulcer and skin  care Screen for HBO Treatment Activities: Skin care regimen initiated : 05/27/2023 Topical wound management initiated : 05/27/2023 Notes: Electronic Signature(s) Signed: 07/30/2023 5:11:40 PM By: [name] RN, BSN Entered By: [name] on 07/30/2023 11:26:09 -------------------------------------------------------------------------------- Pain Assessment Details Patient Name: Date of Service: [name], [name]. 07/30/2023 11:15 A M Medical Record Number: [id number] Patient Account Number: [id number] Date of Birth/Sex: Treating RN: 11/08/1933 (89 y.o. M) Primary Care Tabias Swayze: [name] Other Clinician: Referring Neel Buffone: Treating Enslie Sahota/Extender: [name] in Treatment: [id number], Anthony [name] ([id number]) [id number] Page 5 of 7 Active Problems Location of Pain Severity and Description of Pain Patient Has Paino No Site Locations Rate the pain. Current Pain Level: 0 Pain Management and Medication Current Pain Management: Electronic Signature(s) Signed: 07/30/2023 5:11:40 PM By: [name] RN, BSN Entered By: [name] on 07/30/2023 11:18:17 -------------------------------------------------------------------------------- Patient/Caregiver Education Details Patient Name: Date of Service: [name] [name]. 12/17/2024andnbsp11:15 A M Medical Record Number: [id number] Patient Account Number: [id number] Date of Birth/Gender: Treating RN: 08-24-1933 (89 y.o. [name] Primary Care Physician: [name] Other Clinician: Referring Physician: Treating Physician/Extender: [name] in Treatment: 9  Education Assessment Education Provided To: Patient Education Topics Provided Venous: Methods: Explain/Verbal Responses: Reinforcements needed, State content correctly Electronic Signature(s) Signed: 07/30/2023 5:11:40 PM By: [name] RN, BSN Entered By:  [name] on 07/30/2023 11:26:40 [name], [name] ([id number]) [id number] Page 6 of 7 -------------------------------------------------------------------------------- Wound Assessment Details Patient Name: Date of Service: [name] [name]. 07/30/2023 11:15 A M Medical Record Number: [id number] Patient Account Number: [id number] Date of Birth/Sex: Treating RN: [birth year] (89 y.o. [name] Primary Care Donato Studley: [name] Other Clinician: Referring Shelah Heatley: Treating Xylia Scherger/Extender: [name] in Treatment: 9 Wound Status Wound Number: 1 Primary Trauma, Other Etiology: Wound Location: Left, Lateral Lower Leg Wound Open Wounding Event: Trauma Status: Date Acquired: 05/14/2023 Comorbid Arrhythmia, Congestive Heart Failure, Coronary Artery Disease, Weeks Of Treatment: 9 History: Hypertension, Myocardial Infarction Clustered Wound: No Photos Wound Measurements Length: (cm) 2.5 Width: (cm) 1.4 Depth: (cm) 0.1 Area: (cm) 2.749 Volume: (cm) 0.275 % Reduction in Area: 95.1% % Reduction in Volume: 97.5% Epithelialization: Medium (34-66%) Tunneling: No Undermining: No Wound Description Classification: Full Thickness Without Exposed Support Structures Wound Margin: Flat and Intact Exudate Amount: Medium Exudate Type: Serosanguineous Exudate Color: red, brown Foul Odor After Cleansing: No Slough/Fibrino No Wound Bed Granulation Amount: Large (67-100%) Exposed Structure Granulation Quality: Red, Hyper-granulation Fascia Exposed: No Necrotic Amount: None Present (0%) Fat Layer (Subcutaneous Tissue) Exposed: Yes Tendon Exposed: No Muscle Exposed: No Joint Exposed: No Bone Exposed: No Periwound Skin Texture Texture Color No Abnormalities Noted: Yes No Abnormalities Noted: No Hemosiderin Staining: Yes Moisture No Abnormalities Noted: No Temperature / Pain Dry / Scaly: Yes Temperature: No  Abnormality Treatment Notes Wound #1 (Lower Leg) Wound Laterality: Left, Lateral Cleanser Soap and Water Discharge Instruction: May shower and wash wound with dial antibacterial soap and water prior to dressing change. Wound Cleanser Discharge Instruction: Cleanse the wound with wound cleanser prior to applying a clean dressing using gauze sponges, not tissue or cotton balls. Peri-Wound Care [name], [name] ([id number]) [id number] Page 7 of 7 Sween Lotion (Moisturizing lotion) Discharge Instruction: Apply moisturizing lotion as directed Topical Primary Dressing Hydrofera Blue Ready Transfer Foam, 2.5x2.5 (in/in) Discharge Instruction: Apply directly to wound bed as directed Secondary Dressing ABD Pad, 8x10 Discharge Instruction: Apply over primary dressing as directed. Secured With Compression Wrap Urgo K2 Lite, (equivalent to a 3 layer) two layer compression system, regular Discharge Instruction: Apply Urgo K2 Lite as directed (alternative to 3 layer compression). Compression Stockings Add-Ons Electronic Signature(s) Signed: 07/30/2023 5:11:40 PM By: [name] RN, BSN Entered By: [name] on 07/30/2023 11:21:12 -------------------------------------------------------------------------------- Vitals Details Patient Name: Date of Service: [name], [name]. 07/30/2023 11:15 A M Medical Record Number: [id number] Patient Account Number: [id number] Date of Birth/Sex: Treating RN: 08/03/1934 (89 y.o. M) Primary Care Raequon Catanzaro: [name] Other Clinician: Referring Alyanna Stoermer: Treating Amely Voorheis/Extender: [name] in Treatment: 9 Vital Signs Time Taken: 10:54 Temperature (F): 97.7 Height (in): 75 Pulse (bpm): 70 Weight (lbs): 238 Respiratory Rate (breaths/min): 18 Body Mass Index (BMI): 29.7 Blood Pressure (mmHg): 118/73 Reference Range: 80 - 120 mg / dl Electronic Signature(s) Signed: 07/30/2023 5:11:40  PM By: [name] RN, BSN Entered By: [name] on 07/30/2023 11:18:01

## 2023-07-31 NOTE — Progress Notes (Signed)
DALIAN, SMID ([id number]) [id number] Page 1 of 7 Visit Report for 07/30/2023 Debridement Details Patient Name: Date of Service: [name] ID H. 07/30/2023 11:15 A M Medical Record Number: [id number] Patient Account Number: [id number] Date of Birth/Sex: Treating RN: 1933-10-17 (89 y.o. [name] Primary Care Provider: Tana Conch Other Clinician: Referring Provider: Treating Provider/Extender: Arvin Collard in Treatment: 9 Debridement Performed for Assessment: Wound #1 Left,Lateral Lower Leg Performed By: Physician Duanne Guess, MD The following information was scribed by: [name] The information was scribed for: Duanne Guess Debridement Type: Debridement Level of Consciousness (Pre-procedure): Awake and Alert Pre-procedure Verification/Time Out Yes - 11:25 Taken: Start Time: 11:26 Percent of Wound Bed Debrided: 100% T Area Debrided (cm): otal 2.75 Tissue and other material debrided: Viable, Non-Viable, Slough, Subcutaneous, Slough Level: Skin/Subcutaneous Tissue Debridement Description: Excisional Instrument: Curette Bleeding: Minimum Hemostasis Achieved: Pressure Procedural Pain: 0 Post Procedural Pain: 0 Response to Treatment: Procedure was tolerated well Level of Consciousness (Post- Awake and Alert procedure): Post Debridement Measurements of Total Wound Length: (cm) 2.5 Width: (cm) 1.4 Depth: (cm) 0.1 Volume: (cm) 0.275 Character of Wound/Ulcer Post Debridement: Improved Post Procedure Diagnosis Same as Pre-procedure Electronic Signature(s) Signed: 07/30/2023 12:03:42 PM By: [name] Signed: 07/30/2023 5:11:40 PM By: [name] RN, BSN Entered By: [name] on 07/30/2023 08:28:16 -------------------------------------------------------------------------------- HPI Details Patient Name: Date of Service: [name], [name] 07/30/2023 11:15 A  M Medical Record Number: [id number] Patient Account Number: [id number] Date of Birth/Sex: Treating RN: [birth year] (89 y.o. M) Primary Care Provider: Tana Conch Other Clinician: Referring Provider: Treating Provider/Extender: Arvin Collard in Treatment: 9 History of Present Illness Crespi, [name] ([id number]) [id number] Page 2 of 7 HPI Description: ADMISSION 05/27/2023; The patient is a 89 year old man who traumatized his left leg on a bed frame in [place] on . The wound was initially repaired in the ER with 17 sutures. He has been seen twice in urgent care on 10/8. He was given mupirocin and doxycycline. He was again seen in urgent care on 10/10 felt to have cellulitis and had Cipro added to the doxycycline which she is still taking. The patient is on Eliquis for history of sick sinus syndrome and atrial fibrillation. The patient is not complaining of a lot of pain. He is able to show me pictures of progressive skin necrosis just lateral to the original wound. Past medical history includes sick sinus syndrome, coronary artery disease, aortic stenosis, chronic kidney disease. He is on Eliquis ABI in our clinic at 1.04 on the right and 1.06 on the left 06/03/2023: The cellulitis is still fairly persistent. There are multiple sutures still intact but they are pulling through the soft tissue. There is an odor coming from the wound. 06/11/2023: The wound is much cleaner this week. There is no longer any odor. There is still a fair amount of slough present with eschar around the edges. The culture that I took last week returned with E. coli and staph species, sensitive to the Levaquin I had prescribed. He is currently taking this. 06/18/2023: The wound is very clean this week but the granulation tissue is quite hypertrophic. There is no odor. Edema control is good. The wound VAC is being delivered today. 06/25/2023: The wound has  responded nicely to negative pressure wound therapy. It has contracted quite a bit. The hypertrophic granulation tissue has reaccumulated, however. 07/02/2023: The wound is smaller again this week. The hypertrophic granulation tissue  has built up again. Edema control is good. 07/08/2023: The wound continues to contract. Edema control is good. The hypertrophic granulation tissue has reaccumulated, but not to the extent that it has previously. 07/30/2023: The wound has decreased by at least half the size of when I last saw it. It is flush with the surrounding skin. There is a bit of slough accumulation and the granulation tissue is somewhat hypertrophic. Electronic Signature(s) Signed: 07/30/2023 12:03:42 PM By: [name] Entered By: Duanne Guess on 07/30/2023 57:84:69 -------------------------------------------------------------------------------- Physical Exam Details Patient Name: Date of Service: [name], [name] 07/30/2023 11:15 A M Medical Record Number: [id number] Patient Account Number: [id number] Date of Birth/Sex: Treating RN: August 27, 1933 (89 y.o. M) Primary Care Provider: Tana Conch Other Clinician: Referring Provider: Treating Provider/Extender: Arvin Collard in Treatment: 9 Constitutional . . . . no acute distress. Respiratory Normal work of breathing on room air.. Notes 07/30/2023: The wound has decreased by at least half the size of when I last saw it. It is flush with the surrounding skin. There is a bit of slough accumulation and the granulation tissue is somewhat hypertrophic. Electronic Signature(s) Signed: 07/30/2023 12:03:42 PM By: [name] Entered By: Duanne Guess on 07/30/2023 08:30:46 -------------------------------------------------------------------------------- Physician Orders Details Patient Name: Date of Service: [name], [name] 07/30/2023 11:15 A [name], [name] ([id number])  [id number] Page 3 of 7 Medical Record Number: [id number] Patient Account Number: [id number] Date of Birth/Sex: Treating RN: 09-15-1933 (89 y.o. [name] Primary Care Provider: Tana Conch Other Clinician: Referring Provider: Treating Provider/Extender: Arvin Collard in Treatment: 9 The following information was scribed by: [name] The information was scribed for: Duanne Guess Verbal / Phone Orders: No Diagnosis Coding ICD-10 Coding Code Description [phone] Non-pressure chronic ulcer of other part of left lower leg with other specified severity S80.12XD Contusion of left lower leg, subsequent encounter T45.515S Adverse effect of anticoagulants, sequela Follow-up Appointments ppointment in 1 week. - Dr. Lady Gary 08/06/23 at 10:00 am Return A Anesthetic (In clinic) Topical Lidocaine 4% applied to wound bed Bathing/ Shower/ Hygiene May shower with protection but do not get wound dressing(s) wet. Protect dressing(s) with water repellant cover (for example, large plastic bag) or a cast cover and may then take shower. Negative Presssure Wound Therapy Discontinue wound vac. Call the number on the machine for the company to pick up. Edema Control - Orders / Instructions Left Lower Extremity Elevate legs to the level of the heart or above for 30 minutes daily and/or when sitting for 3-4 times a day throughout the day. Avoid standing for long periods of time. Exercise regularly Lymphedema Treatment Plan - Exercise, Compression and Elevation Exercise daily as tolerated. (Walking, ROM, Calf Pumps and Toe Taps) Compression Wraps as ordered Elevate legs 30 - 60 minutes at or above heart level at least 3 - 4 times daily as able/tolerated Avoid standing for long periods and elevate leg(s) parallel to the floor when sitting Wound Treatment Wound #1 - Lower Leg Wound Laterality: Left, Lateral Cleanser: Soap and Water 1 x  Per Week/30 Days Discharge Instructions: May shower and wash wound with dial antibacterial soap and water prior to dressing change. Cleanser: Wound Cleanser 1 x Per Week/30 Days Discharge Instructions: Cleanse the wound with wound cleanser prior to applying a clean dressing using gauze sponges, not tissue or cotton balls. Peri-Wound Care: Sween Lotion (Moisturizing lotion) 1 x Per Week/30 Days Discharge Instructions: Apply moisturizing  lotion as directed Prim Dressing: Hydrofera Blue Ready Transfer Foam, 2.5x2.5 (in/in) 1 x Per Week/30 Days ary Discharge Instructions: Apply directly to wound bed as directed Secondary Dressing: ABD Pad, 8x10 1 x Per Week/30 Days Discharge Instructions: Apply over primary dressing as directed. Compression Wrap: Urgo K2 Lite, (equivalent to a 3 layer) two layer compression system, regular 1 x Per Week/30 Days Discharge Instructions: Apply Urgo K2 Lite as directed (alternative to 3 layer compression). Electronic Signature(s) Signed: 07/30/2023 12:03:42 PM By: [name] Signed: 07/30/2023 5:11:40 PM By: [name] RN, BSN Entered By: [name] on 07/30/2023 08:32:58 Ramiro, Anup H ([id number]) [id number] Page 4 of 7 -------------------------------------------------------------------------------- Problem List Details Patient Name: Date of Service: [name] ID H. 07/30/2023 11:15 A M Medical Record Number: [id number] Patient Account Number: [id number] Date of Birth/Sex: Treating RN: 1933-09-12 (89 y.o. M) Primary Care Provider: Tana Conch Other Clinician: Referring Provider: Treating Provider/Extender: Arvin Collard in Treatment: 9 Active Problems ICD-10 Encounter Code Description Active Date MDM Diagnosis L97.828 Non-pressure chronic ulcer of other part of left lower leg with other specified 05/27/2023 No Yes severity S80.12XD Contusion of left lower leg, subsequent  encounter 05/27/2023 No Yes T45.515S Adverse effect of anticoagulants, sequela 05/27/2023 No Yes Inactive Problems Resolved Problems ICD-10 Code Description Active Date Resolved Date L03.116 Cellulitis of left lower limb 05/27/2023 05/27/2023 Electronic Signature(s) Signed: 07/30/2023 12:03:42 PM By: [name] Entered By: Duanne Guess on 07/30/2023 08:11:00 -------------------------------------------------------------------------------- Progress Note Details Patient Name: Date of Service: [name], [name] 07/30/2023 11:15 A M Medical Record Number: [id number] Patient Account Number: [id number] Date of Birth/Sex: Treating RN: March 18, 1934 (89 y.o. M) Primary Care Provider: Tana Conch Other Clinician: Referring Provider: Treating Provider/Extender: Arvin Collard in Treatment: 9 Subjective History of Present Illness (HPI) ADMISSION 05/27/2023; The patient is a 89 year old man who traumatized his left leg on a bed frame in [place] on . The wound was initially repaired in the ER with 17 sutures. He has been seen twice in urgent care on 10/8. He was given mupirocin and doxycycline. He was again seen in urgent care on 10/10 felt to have cellulitis and had Cipro added to the doxycycline which she is still taking. The patient is on Eliquis for history of sick sinus syndrome and atrial fibrillation. The patient is not complaining of a lot of pain. He is able to show me pictures of progressive skin necrosis just lateral to the original wound. OLEH, KEATH ([id number]) [id number] Page 5 of 7 Past medical history includes sick sinus syndrome, coronary artery disease, aortic stenosis, chronic kidney disease. He is on Eliquis ABI in our clinic at 1.04 on the right and 1.06 on the left 06/03/2023: The cellulitis is still fairly persistent. There are multiple sutures still intact but they are pulling through the  soft tissue. There is an odor coming from the wound. 06/11/2023: The wound is much cleaner this week. There is no longer any odor. There is still a fair amount of slough present with eschar around the edges. The culture that I took last week returned with E. coli and staph species, sensitive to the Levaquin I had prescribed. He is currently taking this. 06/18/2023: The wound is very clean this week but the granulation tissue is quite hypertrophic. There is no odor. Edema control is good. The wound VAC is being delivered today. 06/25/2023: The wound has responded nicely to negative pressure wound therapy. It has  contracted quite a bit. The hypertrophic granulation tissue has reaccumulated, however. 07/02/2023: The wound is smaller again this week. The hypertrophic granulation tissue has built up again. Edema control is good. 07/08/2023: The wound continues to contract. Edema control is good. The hypertrophic granulation tissue has reaccumulated, but not to the extent that it has previously. 07/30/2023: The wound has decreased by at least half the size of when I last saw it. It is flush with the surrounding skin. There is a bit of slough accumulation and the granulation tissue is somewhat hypertrophic. Objective Constitutional no acute distress. Vitals Time Taken: 10:54 AM, Height: 75 in, Weight: 238 lbs, BMI: 29.7, Temperature: 97.7 F, Pulse: 70 bpm, Respiratory Rate: 18 breaths/min, Blood Pressure: 118/73 mmHg. Respiratory Normal work of breathing on room air.. General Notes: 07/30/2023: The wound has decreased by at least half the size of when I last saw it. It is flush with the surrounding skin. There is a bit of slough accumulation and the granulation tissue is somewhat hypertrophic. Integumentary (Hair, Skin) Wound #1 status is Open. Original cause of wound was Trauma. The date acquired was: 05/14/2023. The wound has been in treatment 9 weeks. The wound is located on the Left,Lateral Lower  Leg. The wound measures 2.5cm length x 1.4cm width x 0.1cm depth; 2.749cm^2 area and 0.275cm^3 volume. There is Fat Layer (Subcutaneous Tissue) exposed. There is no tunneling or undermining noted. There is a medium amount of serosanguineous drainage noted. The wound margin is flat and intact. There is large (67-100%) red, hyper - granulation within the wound bed. There is no necrotic tissue within the wound bed. The periwound skin appearance had no abnormalities noted for texture. The periwound skin appearance exhibited: Dry/Scaly, Hemosiderin Staining. Periwound temperature was noted as No Abnormality. Assessment Active Problems ICD-10 Non-pressure chronic ulcer of other part of left lower leg with other specified severity Contusion of left lower leg, subsequent encounter Adverse effect of anticoagulants, sequela Procedures Wound #1 Pre-procedure diagnosis of Wound #1 is a Trauma, Other located on the Left,Lateral Lower Leg . There was a Excisional Skin/Subcutaneous Tissue Debridement with a total area of 2.75 sq cm performed by Duanne Guess, MD. With the following instrument(s): Curette to remove Viable and Non-Viable tissue/material. Material removed includes Subcutaneous Tissue and Slough and. No specimens were taken. A time out was conducted at 11:25, prior to the start of the procedure. A Minimum amount of bleeding was controlled with Pressure. The procedure was tolerated well with a pain level of 0 throughout and a pain level of 0 following the procedure. Post Debridement Measurements: 2.5cm length x 1.4cm width x 0.1cm depth; 0.275cm^3 volume. Character of Wound/Ulcer Post Debridement is improved. Post procedure Diagnosis Wound #1: Same as Pre-Procedure Pre-procedure diagnosis of Wound #1 is a Trauma, Other located on the Left,Lateral Lower Leg . There was a Double Layer Compression Therapy Procedure by [name], RN. Post procedure Diagnosis Wound #1: Same as  Pre-Procedure Gaede, Millard H ([id number]) [phone].pdf Page 6 of 7 Notes: urgo lite. Plan Follow-up Appointments: Return Appointment in 1 week. - Dr. Lady Gary 08/06/23 at 10:00 am Anesthetic: (In clinic) Topical Lidocaine 4% applied to wound bed Bathing/ Shower/ Hygiene: May shower with protection but do not get wound dressing(s) wet. Protect dressing(s) with water repellant cover (for example, large plastic bag) or a cast cover and may then take shower. Negative Presssure Wound Therapy: Discontinue wound vac. Call the number on the machine for the company to pick up. Edema Control - Orders / Instructions:  Elevate legs to the level of the heart or above for 30 minutes daily and/or when sitting for 3-4 times a day throughout the day. Avoid standing for long periods of time. Exercise regularly Lymphedema Treatment Plan - Exercise, Compression and Elevation: Exercise daily as tolerated. (Walking, ROM, Calf Pumps and T T oe aps) Compression Wraps as ordered Elevate legs 30 - 60 minutes at or above heart level at least 3 - 4 times daily as able/tolerated Avoid standing for long periods and elevate leg(s) parallel to the floor when sitting WOUND #1: - Lower Leg Wound Laterality: Left, Lateral Cleanser: Soap and Water 1 x Per Week/30 Days Discharge Instructions: May shower and wash wound with dial antibacterial soap and water prior to dressing change. Cleanser: Wound Cleanser 1 x Per Week/30 Days Discharge Instructions: Cleanse the wound with wound cleanser prior to applying a clean dressing using gauze sponges, not tissue or cotton balls. Peri-Wound Care: Sween Lotion (Moisturizing lotion) 1 x Per Week/30 Days Discharge Instructions: Apply moisturizing lotion as directed Prim Dressing: Hydrofera Blue Ready Transfer Foam, 2.5x2.5 (in/in) 1 x Per Week/30 Days ary Discharge Instructions: Apply directly to wound bed as directed Secondary Dressing: ABD Pad, 8x10 1 x Per  Week/30 Days Discharge Instructions: Apply over primary dressing as directed. Com pression Wrap: Urgo K2 Lite, (equivalent to a 3 layer) two layer compression system, regular 1 x Per Week/30 Days Discharge Instructions: Apply Urgo K2 Lite as directed (alternative to 3 layer compression). 07/30/2023: The wound has decreased by at least half the size of when I last saw it. It is flush with the surrounding skin. There is a bit of slough accumulation and the granulation tissue is somewhat hypertrophic. I used a curette to debride slough and subcutaneous tissue from the wound. I then chemically cauterized the hypertrophic granulation tissue with silver nitrate. I think we can discontinue the negative pressure wound therapy at this point. I am going to use Hydrofera Blue with Urgo light compression. Follow-up in 1 week. Electronic Signature(s) Signed: 07/31/2023 5:35:56 PM By: Shawn Stall RN, BSN Signed: 08/01/2023 8:41:18 AM By: [name] Previous Signature: 07/30/2023 12:03:42 PM Version By: [name] Entered By: Shawn Stall on 07/31/2023 14:20:01 -------------------------------------------------------------------------------- SuperBill Details Patient Name: Date of Service: [name], [name] 07/30/2023 Medical Record Number: [id number] Patient Account Number: [id number] Date of Birth/Sex: Treating RN: 10-30-1933 (89 y.o. M) Primary Care Provider: Tana Conch Other Clinician: Referring Provider: Treating Provider/Extender: Arvin Collard in Treatment: 9 Diagnosis Coding ICD-10 Codes Code Description [id number] Non-pressure chronic ulcer of other part of left lower leg with other specified severity S80.12XD Contusion of left lower leg, subsequent encounter RYDAN, NELLES ([id number]) [id number] Page 7 of 7 T45.515S Adverse effect of anticoagulants, sequela Facility Procedures : CPT4 Code:  [id number] Description: 11042 - DEB SUBQ TISSUE 20 SQ CM/< ICD-10 Diagnosis Description L97.828 Non-pressure chronic ulcer of other part of left lower leg with other specified Modifier: severity Quantity: 1 Physician Procedures : CPT4 Code Description Modifier [id number] - WC PHYS LEVEL 3 - EST PT ICD-10 Diagnosis Description L97.828 Non-pressure chronic ulcer of other part of left lower leg with other specified severity S80.12XD Contusion of left lower leg, subsequent  encounter T45.515S Adverse effect of anticoagulants, sequela Quantity: 1 : [id number] 11042 - WC PHYS SUBQ TISS 20 SQ CM ICD-10 Diagnosis Description L97.828 Non-pressure chronic ulcer of other part of left lower leg with other specified severity Quantity: 1 Electronic Signature(s) Signed: 07/30/2023  12:03:42 PM By: [name] Entered By: Duanne Guess on 07/30/2023 08:41:24

## 2023-08-06 ENCOUNTER — Encounter (HOSPITAL_BASED_OUTPATIENT_CLINIC_OR_DEPARTMENT_OTHER): Payer: Medicare Other | Admitting: General Surgery

## 2023-08-06 DIAGNOSIS — L97828 Non-pressure chronic ulcer of other part of left lower leg with other specified severity: Secondary | ICD-10-CM | POA: Diagnosis not present

## 2023-08-06 DIAGNOSIS — N189 Chronic kidney disease, unspecified: Secondary | ICD-10-CM | POA: Diagnosis not present

## 2023-08-06 DIAGNOSIS — Z7901 Long term (current) use of anticoagulants: Secondary | ICD-10-CM | POA: Diagnosis not present

## 2023-08-06 DIAGNOSIS — I495 Sick sinus syndrome: Secondary | ICD-10-CM | POA: Diagnosis not present

## 2023-08-06 DIAGNOSIS — I2510 Atherosclerotic heart disease of native coronary artery without angina pectoris: Secondary | ICD-10-CM | POA: Diagnosis not present

## 2023-08-06 DIAGNOSIS — I129 Hypertensive chronic kidney disease with stage 1 through stage 4 chronic kidney disease, or unspecified chronic kidney disease: Secondary | ICD-10-CM | POA: Diagnosis not present

## 2023-08-06 DIAGNOSIS — S81802A Unspecified open wound, left lower leg, initial encounter: Secondary | ICD-10-CM | POA: Diagnosis not present

## 2023-08-06 NOTE — Progress Notes (Signed)
ELBY, STRADFORD ([id number]) [id number] Page 1 of 7 Visit Report for 08/06/2023 Chief Complaint Document Details Patient Name: Date of Service: [name] ID H. 08/06/2023 10:00 A M Medical Record Number: [id number] Patient Account Number: [id number] Date of Birth/Sex: Treating RN: 1933/12/13 (89 y.o. M) Primary Care Provider: Tana Conch Other Clinician: Referring Provider: Treating Provider/Extender: Arvin Collard in Treatment: 10 Information Obtained from: Patient Chief Complaint 05/27/2023; patient comes in today with a traumatic wound on the left anterior lower leg Electronic Signature(s) Signed: 08/06/2023 10:30:31 AM By: [name] Entered By: Duanne Guess on 08/06/2023 07:23:54 -------------------------------------------------------------------------------- Debridement Details Patient Name: Date of Service: [name], [name] 08/06/2023 10:00 A M Medical Record Number: [id number] Patient Account Number: [id number] Date of Birth/Sex: Treating RN: 1934/01/31 (89 y.o. Cline Cools Primary Care Provider: Tana Conch Other Clinician: Referring Provider: Treating Provider/Extender: Arvin Collard in Treatment: 10 Debridement Performed for Assessment: Wound #1 Left,Lateral Lower Leg Performed By: Physician Duanne Guess, MD The following information was scribed by: Redmond Pulling The information was scribed for: Duanne Guess Debridement Type: Debridement Level of Consciousness (Pre-procedure): Awake and Alert Pre-procedure Verification/Time Out Yes - 10:15 Taken: Start Time: 10:15 Pain Control: Lidocaine 5% topical ointment Percent of Wound Bed Debrided: 100% T Area Debrided (cm): otal 0.66 Tissue and other material debrided: Non-Viable, Eschar, Slough, Subcutaneous, Slough Level: Skin/Subcutaneous Tissue Debridement Description: Excisional Instrument:  Curette Bleeding: Minimum Hemostasis Achieved: Pressure Response to Treatment: Procedure was tolerated well Level of Consciousness (Post- Awake and Alert procedure): Post Debridement Measurements of Total Wound Length: (cm) 1.4 Width: (cm) 0.6 Depth: (cm) 0.1 Volume: (cm) 0.066 Character of Wound/Ulcer Post Debridement: Improved Post Procedure Diagnosis Zacarias, Shon Rexene Edison ([id number]) [id number] Page 2 of 7 Same as Pre-procedure Electronic Signature(s) Signed: 08/06/2023 10:30:31 AM By: [name] Signed: 08/06/2023 12:06:25 PM By: Redmond Pulling RN, BSN Entered By: Redmond Pulling on 08/06/2023 07:16:03 -------------------------------------------------------------------------------- HPI Details Patient Name: Date of Service: [name], [name] 08/06/2023 10:00 A M Medical Record Number: [id number] Patient Account Number: [id number] Date of Birth/Sex: Treating RN: 03-03-1934 (89 y.o. M) Primary Care Provider: Tana Conch Other Clinician: Referring Provider: Treating Provider/Extender: Arvin Collard in Treatment: 10 History of Present Illness HPI Description: ADMISSION 05/27/2023; The patient is a 89 year old man who traumatized his left leg on a bed frame in [place] on . The wound was initially repaired in the ER with 17 sutures. He has been seen twice in urgent care on 10/8. He was given mupirocin and doxycycline. He was again seen in urgent care on 10/10 felt to have cellulitis and had Cipro added to the doxycycline which she is still taking. The patient is on Eliquis for history of sick sinus syndrome and atrial fibrillation. The patient is not complaining of a lot of pain. He is able to show me pictures of progressive skin necrosis just lateral to the original wound. Past medical history includes sick sinus syndrome, coronary artery disease, aortic stenosis, chronic kidney disease. He is on Eliquis ABI  in our clinic at 1.04 on the right and 1.06 on the left 06/03/2023: The cellulitis is still fairly persistent. There are multiple sutures still intact but they are pulling through the soft tissue. There is an odor coming from the wound. 06/11/2023: The wound is much cleaner this week. There is no longer any odor. There is still a fair amount of slough present with eschar around  the edges. The culture that I took last week returned with E. coli and staph species, sensitive to the Levaquin I had prescribed. He is currently taking this. 06/18/2023: The wound is very clean this week but the granulation tissue is quite hypertrophic. There is no odor. Edema control is good. The wound VAC is being delivered today. 06/25/2023: The wound has responded nicely to negative pressure wound therapy. It has contracted quite a bit. The hypertrophic granulation tissue has reaccumulated, however. 07/02/2023: The wound is smaller again this week. The hypertrophic granulation tissue has built up again. Edema control is good. 07/08/2023: The wound continues to contract. Edema control is good. The hypertrophic granulation tissue has reaccumulated, but not to the extent that it has previously. 07/30/2023: The wound has decreased by at least half the size of when I last saw it. It is flush with the surrounding skin. There is a bit of slough accumulation and the granulation tissue is somewhat hypertrophic. 08/06/2023: There has been more epithelialization of the wound. There is some thin brittle eschar and slough on the surface. No concern for infection and no reaccumulation of the hypertrophic granulation tissue. Electronic Signature(s) Signed: 08/06/2023 10:30:31 AM By: [name] Entered By: Duanne Guess on 08/06/2023 07:26:41 -------------------------------------------------------------------------------- Physical Exam Details Patient Name: Date of Service: [name], [name] 08/06/2023 10:00 A  M Medical Record Number: [id number] Patient Account Number: [id number] Date of Birth/Sex: Treating RN: Jul 20, 1934 (89 y.o. M) Primary Care Provider: Tana Conch Other Clinician: Tiburcio Pea, Kevyn Rexene Edison ([id number]) [id number] Page 3 of 7 Referring Provider: Treating Provider/Extender: Arvin Collard in Treatment: 10 Constitutional . . . . no acute distress. Respiratory Normal work of breathing on room air.. Notes 08/06/2023: There has been more epithelialization of the wound. There is some thin brittle eschar and slough on the surface. No concern for infection and no reaccumulation of the hypertrophic granulation tissue. Electronic Signature(s) Signed: 08/06/2023 10:30:31 AM By: [name] Entered By: Duanne Guess on 08/06/2023 07:28:35 -------------------------------------------------------------------------------- Physician Orders Details Patient Name: Date of Service: [name], [name] 08/06/2023 10:00 A M Medical Record Number: [id number] Patient Account Number: [id number] Date of Birth/Sex: Treating RN: February 13, 1934 (89 y.o. Cline Cools Primary Care Provider: Tana Conch Other Clinician: Referring Provider: Treating Provider/Extender: Arvin Collard in Treatment: 10 Verbal / Phone Orders: No Diagnosis Coding ICD-10 Coding Code Description [phone] Non-pressure chronic ulcer of other part of left lower leg with other specified severity S80.12XD Contusion of left lower leg, subsequent encounter T45.515S Adverse effect of anticoagulants, sequela Follow-up Appointments ppointment in 1 week. - Dr. Lady Gary 08/13/23 at 10:00 am Return A Anesthetic (In clinic) Topical Lidocaine 4% applied to wound bed Bathing/ Shower/ Hygiene May shower with protection but do not get wound dressing(s) wet. Protect dressing(s) with water repellant cover (for example, large plastic bag) or a cast  cover and may then take shower. Negative Presssure Wound Therapy Discontinue wound vac. Call the number on the machine for the company to pick up. Edema Control - Orders / Instructions Left Lower Extremity Elevate legs to the level of the heart or above for 30 minutes daily and/or when sitting for 3-4 times a day throughout the day. Avoid standing for long periods of time. Exercise regularly Lymphedema Treatment Plan - Exercise, Compression and Elevation Exercise daily as tolerated. (Walking, ROM, Calf Pumps and Toe Taps) Compression Wraps as ordered Elevate legs 30 - 60 minutes at or above heart level  at least 3 - 4 times daily as able/tolerated Avoid standing for long periods and elevate leg(s) parallel to the floor when sitting Wound Treatment Wound #1 - Lower Leg Wound Laterality: Left, Lateral Cleanser: Soap and Water 1 x Per Week/30 Days Discharge Instructions: May shower and wash wound with dial antibacterial soap and water prior to dressing change. Cleanser: Wound Cleanser 1 x Per Week/30 Days Discharge Instructions: Cleanse the wound with wound cleanser prior to applying a clean dressing using gauze sponges, not tissue or cotton balls. DAQUAIN, CONTORNO ([id number]) [id number] Page 4 of 7 Peri-Wound Care: Sween Lotion (Moisturizing lotion) 1 x Per Week/30 Days Discharge Instructions: Apply moisturizing lotion as directed Prim Dressing: Hydrofera Blue Ready Transfer Foam, 2.5x2.5 (in/in) 1 x Per Week/30 Days ary Discharge Instructions: Apply directly to wound bed as directed Secondary Dressing: ABD Pad, 8x10 1 x Per Week/30 Days Discharge Instructions: Apply over primary dressing as directed. Compression Wrap: Urgo K2 Lite, (equivalent to a 3 layer) two layer compression system, regular 1 x Per Week/30 Days Discharge Instructions: Apply Urgo K2 Lite as directed (alternative to 3 layer compression). Electronic Signature(s) Signed: 08/06/2023 10:30:31 AM  By: [name] Entered By: Duanne Guess on 08/06/2023 07:29:00 -------------------------------------------------------------------------------- Problem List Details Patient Name: Date of Service: [name], [name] 08/06/2023 10:00 A M Medical Record Number: [id number] Patient Account Number: [id number] Date of Birth/Sex: Treating RN: 04-11-1934 (89 y.o. M) Primary Care Provider: Tana Conch Other Clinician: Referring Provider: Treating Provider/Extender: Arvin Collard in Treatment: 10 Active Problems ICD-10 Encounter Code Description Active Date MDM Diagnosis L97.828 Non-pressure chronic ulcer of other part of left lower leg with other specified 05/27/2023 No Yes severity S80.12XD Contusion of left lower leg, subsequent encounter 05/27/2023 No Yes T45.515S Adverse effect of anticoagulants, sequela 05/27/2023 No Yes Inactive Problems Resolved Problems ICD-10 Code Description Active Date Resolved Date L03.116 Cellulitis of left lower limb 05/27/2023 05/27/2023 Electronic Signature(s) Signed: 08/06/2023 10:30:31 AM By: [name] Entered By: Duanne Guess on 08/06/2023 07:21:46 Thall, Ashtin H ([id number]) [id number] Page 5 of 7 -------------------------------------------------------------------------------- Progress Note Details Patient Name: Date of Service: HA Ellwood Dense ID H. 08/06/2023 10:00 A M Medical Record Number: [id number] Patient Account Number: [id number] Date of Birth/Sex: Treating RN: [birth year] (89 y.o. M) Primary Care Provider: Tana Conch Other Clinician: Referring Provider: Treating Provider/Extender: Arvin Collard in Treatment: 10 Subjective Chief Complaint Information obtained from Patient 05/27/2023; patient comes in today with a traumatic wound on the left anterior lower leg History of Present Illness  (HPI) ADMISSION 05/27/2023; The patient is a 89 year old man who traumatized his left leg on a bed frame in [place] on . The wound was initially repaired in the ER with 17 sutures. He has been seen twice in urgent care on 10/8. He was given mupirocin and doxycycline. He was again seen in urgent care on 10/10 felt to have cellulitis and had Cipro added to the doxycycline which she is still taking. The patient is on Eliquis for history of sick sinus syndrome and atrial fibrillation. The patient is not complaining of a lot of pain. He is able to show me pictures of progressive skin necrosis just lateral to the original wound. Past medical history includes sick sinus syndrome, coronary artery disease, aortic stenosis, chronic kidney disease. He is on Eliquis ABI in our clinic at 1.04 on the right and 1.06 on the left 06/03/2023: The cellulitis is still fairly persistent. There are  multiple sutures still intact but they are pulling through the soft tissue. There is an odor coming from the wound. 06/11/2023: The wound is much cleaner this week. There is no longer any odor. There is still a fair amount of slough present with eschar around the edges. The culture that I took last week returned with E. coli and staph species, sensitive to the Levaquin I had prescribed. He is currently taking this. 06/18/2023: The wound is very clean this week but the granulation tissue is quite hypertrophic. There is no odor. Edema control is good. The wound VAC is being delivered today. 06/25/2023: The wound has responded nicely to negative pressure wound therapy. It has contracted quite a bit. The hypertrophic granulation tissue has reaccumulated, however. 07/02/2023: The wound is smaller again this week. The hypertrophic granulation tissue has built up again. Edema control is good. 07/08/2023: The wound continues to contract. Edema control is good. The hypertrophic granulation tissue has reaccumulated, but not to  the extent that it has previously. 07/30/2023: The wound has decreased by at least half the size of when I last saw it. It is flush with the surrounding skin. There is a bit of slough accumulation and the granulation tissue is somewhat hypertrophic. 08/06/2023: There has been more epithelialization of the wound. There is some thin brittle eschar and slough on the surface. No concern for infection and no reaccumulation of the hypertrophic granulation tissue. Objective Constitutional no acute distress. Vitals Time Taken: 9:59 AM, Height: 75 in, Weight: 238 lbs, BMI: 29.7, Temperature: 97.6 F, Pulse: 80 bpm, Respiratory Rate: 18 breaths/min, Blood Pressure: 122/72 mmHg. Respiratory Normal work of breathing on room air.. General Notes: 08/06/2023: There has been more epithelialization of the wound. There is some thin brittle eschar and slough on the surface. No concern for infection and no reaccumulation of the hypertrophic granulation tissue. Integumentary (Hair, Skin) Wound #1 status is Open. Original cause of wound was Trauma. The date acquired was: 05/14/2023. The wound has been in treatment 10 weeks. The wound is located on the Left,Lateral Lower Leg. The wound measures 1.4cm length x 0.6cm width x 0.1cm depth; 0.66cm^2 area and 0.066cm^3 volume. There is a medium amount of serosanguineous drainage noted. OVIDIO, PREUSSER ([id number]) [id number] Page 6 of 7 Assessment Active Problems ICD-10 Non-pressure chronic ulcer of other part of left lower leg with other specified severity Contusion of left lower leg, subsequent encounter Adverse effect of anticoagulants, sequela Procedures Wound #1 Pre-procedure diagnosis of Wound #1 is a Trauma, Other located on the Left,Lateral Lower Leg . There was a Excisional Skin/Subcutaneous Tissue Debridement with a total area of 0.66 sq cm performed by Duanne Guess, MD. With the following instrument(s): Curette to remove  Non-Viable tissue/material. Material removed includes Eschar, Subcutaneous Tissue, and Slough after achieving pain control using Lidocaine 5% topical ointment. No specimens were taken. A time out was conducted at 10:15, prior to the start of the procedure. A Minimum amount of bleeding was controlled with Pressure. The procedure was tolerated well. Post Debridement Measurements: 1.4cm length x 0.6cm width x 0.1cm depth; 0.066cm^3 volume. Character of Wound/Ulcer Post Debridement is improved. Post procedure Diagnosis Wound #1: Same as Pre-Procedure Pre-procedure diagnosis of Wound #1 is a Trauma, Other located on the Left,Lateral Lower Leg . There was a Three Layer Compression Therapy Procedure by Redmond Pulling, RN. Post procedure Diagnosis Wound #1: Same as Pre-Procedure Plan Follow-up Appointments: Return Appointment in 1 week. - Dr. Lady Gary 08/13/23 at 10:00 am Anesthetic: (In clinic) Topical Lidocaine  4% applied to wound bed Bathing/ Shower/ Hygiene: May shower with protection but do not get wound dressing(s) wet. Protect dressing(s) with water repellant cover (for example, large plastic bag) or a cast cover and may then take shower. Negative Presssure Wound Therapy: Discontinue wound vac. Call the number on the machine for the company to pick up. Edema Control - Orders / Instructions: Elevate legs to the level of the heart or above for 30 minutes daily and/or when sitting for 3-4 times a day throughout the day. Avoid standing for long periods of time. Exercise regularly Lymphedema Treatment Plan - Exercise, Compression and Elevation: Exercise daily as tolerated. (Walking, ROM, Calf Pumps and T T oe aps) Compression Wraps as ordered Elevate legs 30 - 60 minutes at or above heart level at least 3 - 4 times daily as able/tolerated Avoid standing for long periods and elevate leg(s) parallel to the floor when sitting WOUND #1: - Lower Leg Wound Laterality: Left, Lateral Cleanser: Soap and  Water 1 x Per Week/30 Days Discharge Instructions: May shower and wash wound with dial antibacterial soap and water prior to dressing change. Cleanser: Wound Cleanser 1 x Per Week/30 Days Discharge Instructions: Cleanse the wound with wound cleanser prior to applying a clean dressing using gauze sponges, not tissue or cotton balls. Peri-Wound Care: Sween Lotion (Moisturizing lotion) 1 x Per Week/30 Days Discharge Instructions: Apply moisturizing lotion as directed Prim Dressing: Hydrofera Blue Ready Transfer Foam, 2.5x2.5 (in/in) 1 x Per Week/30 Days ary Discharge Instructions: Apply directly to wound bed as directed Secondary Dressing: ABD Pad, 8x10 1 x Per Week/30 Days Discharge Instructions: Apply over primary dressing as directed. Com pression Wrap: Urgo K2 Lite, (equivalent to a 3 layer) two layer compression system, regular 1 x Per Week/30 Days Discharge Instructions: Apply Urgo K2 Lite as directed (alternative to 3 layer compression). 08/06/2023: There has been more epithelialization of the wound. There is some thin brittle eschar and slough on the surface. No concern for infection and no reaccumulation of the hypertrophic granulation tissue. I used a curette to debride eschar, slough, and subcutaneous tissue from the wound. We will continue Hydrofera Blue and Urgo light compression. Follow-up in 1 week. Electronic Signature(s) Signed: 08/06/2023 10:30:31 AM By: [name] Pedro, Kemari H ([id number]) [id number] Page 7 of 7 Signed: 08/06/2023 10:30:31 AM By: [name] Entered By: Duanne Guess on 08/06/2023 07:30:02 -------------------------------------------------------------------------------- SuperBill Details Patient Name: Date of Service: [name], [name] 08/06/2023 Medical Record Number: [id number] Patient Account Number: [id number] Date of Birth/Sex: Treating RN: 1933/10/29 (89 y.o. M) Primary Care Provider:  Tana Conch Other Clinician: Referring Provider: Treating Provider/Extender: Arvin Collard in Treatment: 10 Diagnosis Coding ICD-10 Codes Code Description [phone] Non-pressure chronic ulcer of other part of left lower leg with other specified severity S80.12XD Contusion of left lower leg, subsequent encounter T45.515S Adverse effect of anticoagulants, sequela Facility Procedures : CPT4 Code: [id number] Description: 11042 - DEB SUBQ TISSUE 20 SQ CM/< ICD-10 Diagnosis Description L97.828 Non-pressure chronic ulcer of other part of left lower leg with other specified S80.12XD Contusion of left lower leg, subsequent encounter T45.515S Adverse effect of  anticoagulants, sequela Modifier: severity Quantity: 1 Physician Procedures : CPT4 Code Description Modifier [id number] 11042 - WC PHYS SUBQ TISS 20 SQ CM ICD-10 Diagnosis Description L97.828 Non-pressure chronic ulcer of other part of left lower leg with other specified severity S80.12XD Contusion of left lower leg, subsequent  encounter T45.515S Adverse effect of anticoagulants, sequela  Quantity: 1 Electronic Signature(s) Signed: 08/06/2023 10:30:31 AM By: [name] Entered By: Duanne Guess on 08/06/2023 07:30:11

## 2023-08-06 NOTE — Progress Notes (Signed)
MARQUIESE, CASSANI ([id number]) [id number] Page 1 of 7 Visit Report for 08/06/2023 Arrival Information Details Patient Name: Date of Service: [name] ID H. 08/06/2023 10:00 A M Medical Record Number: [id number] Patient Account Number: [id number] Date of Birth/Sex: Treating RN: [birth year] (89 y.o. M) Primary Care Junella Domke: Tana Conch Other Clinician: Referring Jaiven Graveline: Treating Joda Braatz/Extender: Arvin Collard in Treatment: 10 Visit Information History Since Last Visit Added or deleted any medications: No Patient Arrived: Ambulatory Any new allergies or adverse reactions: No Arrival Time: 09:55 Had a fall or experienced change in No Accompanied By: self activities of daily living that may affect Transfer Assistance: None risk of falls: Patient Identification Verified: Yes Signs or symptoms of abuse/neglect since last visito No Secondary Verification Process Completed: Yes Hospitalized since last visit: No Patient Requires Transmission-Based Precautions: No Implantable device outside of the clinic excluding No Patient Has Alerts: No cellular tissue based products placed in the center since last visit: Pain Present Now: No Electronic Signature(s) Signed: 08/06/2023 11:17:23 AM By: Dayton Scrape Entered By: Dayton Scrape on 08/06/2023 06:59:28 -------------------------------------------------------------------------------- Compression Therapy Details Patient Name: Date of Service: [name], [name] 08/06/2023 10:00 A M Medical Record Number: [id number] Patient Account Number: [id number] Date of Birth/Sex: Treating RN: [birth year] (89 y.o. Cline Cools Primary Care Jessi Jessop: Tana Conch Other Clinician: Referring Emi Lymon: Treating Antero Derosia/Extender: Arvin Collard in Treatment: 10 Compression Therapy Performed for Wound Assessment: Wound #1 Left,Lateral Lower Leg Performed By: Clinician  Redmond Pulling, RN Compression Type: Three Layer Post Procedure Diagnosis Same as Pre-procedure Electronic Signature(s) Signed: 08/06/2023 12:06:25 PM By: Redmond Pulling RN, BSN Entered By: Redmond Pulling on 08/06/2023 07:16:17 Encounter Discharge Information Details -------------------------------------------------------------------------------- [name] ([id number]) [id number] Page 2 of 7 Patient Name: Date of Service: [name] ID H. 08/06/2023 10:00 A M Medical Record Number: [id number] Patient Account Number: [id number] Date of Birth/Sex: Treating RN: Dec 04, 1933 (89 y.o. Cline Cools Primary Care Lorilyn Laitinen: Tana Conch Other Clinician: Referring Omauri Boeve: Treating Dyshawn Cangelosi/Extender: Arvin Collard in Treatment: 10 Encounter Discharge Information Items Post Procedure Vitals Discharge Condition: Stable Temperature (F): 97.6 Ambulatory Status: Ambulatory Pulse (bpm): 80 Discharge Destination: Home Respiratory Rate (breaths/min): 18 Transportation: Private Auto Blood Pressure (mmHg): 122/72 Accompanied By: self Schedule Follow-up Appointment: Yes Clinical Summary of Care: Patient Declined Electronic Signature(s) Signed: 08/06/2023 12:06:25 PM By: Redmond Pulling RN, BSN Entered By: Redmond Pulling on 08/06/2023 07:24:36 -------------------------------------------------------------------------------- Lower Extremity Assessment Details Patient Name: Date of Service: [name], [name] 08/06/2023 10:00 A M Medical Record Number: [id number] Patient Account Number: [id number] Date of Birth/Sex: Treating RN: [birth year] (89 y.o. Cline Cools Primary Care Tonimarie Gritz: Tana Conch Other Clinician: Referring Zayvon Alicea: Treating Bob Eastwood/Extender: Arvin Collard in Treatment: 10 Edema Assessment Assessed: Kyra Searles: No] Franne Forts: No] Edema: [Left: Ye] [Right: s] Calf Left: Right: Point of  Measurement: From Medial Instep 37.5 cm Ankle Left: Right: Point of Measurement: From Medial Instep 24 cm Vascular Assessment Pulses: Dorsalis Pedis Palpable: [Left:Yes] Extremity colors, hair growth, and conditions: Extremity Color: [Left:Hyperpigmented] Hair Growth on Extremity: [Left:No] Temperature of Extremity: [Left:Warm] Capillary Refill: [Left:< 3 seconds] Dependent Rubor: [Left:No No] Electronic Signature(s) Signed: 08/06/2023 12:06:25 PM By: Redmond Pulling RN, BSN Entered By: Redmond Pulling on 08/06/2023 07:10:12 Ridinger, Massie H ([id number]) [id number] Page 3 of 7 -------------------------------------------------------------------------------- Multi Wound Chart Details Patient Name: Date of Service: HA Ellwood Dense ID H. 08/06/2023 10:00 A M Medical Record Number: [id number] Patient  Account Number: [id number] Date of Birth/Sex: Treating RN: May 01, 1934 (89 y.o. M) Primary Care Almadelia Looman: Tana Conch Other Clinician: Referring Rowene Suto: Treating Trev Boley/Extender: Arvin Collard in Treatment: 10 Vital Signs Height(in): 75 Pulse(bpm): 80 Weight(lbs): 238 Blood Pressure(mmHg): 122/72 Body Mass Index(BMI): 29.7 Temperature(F): 97.6 Respiratory Rate(breaths/min): 18 [1:Photos: No Photos Left, Lateral Lower Leg Wound Location: Trauma Wounding Event: Trauma, Other Primary Etiology: 05/14/2023 Date Acquired: 10 Weeks of Treatment: Open Wound Status: No Wound Recurrence: 1.4x0.6x0.1 Measurements L x W x D (cm) 0.66 A (cm) : rea  0.066 Volume (cm) : 98.80% % Reduction in A rea: 99.40% % Reduction in Volume: Full Thickness Without Exposed Classification: Support Structures Medium Exudate Amount: Serosanguineous Exudate Type: red, brown Exudate Color: Debridement - Excisional  Debridement: Pre-procedure Verification/Time Out 10:15 Taken: Lidocaine 5% topical ointment Pain Control: Necrotic/Eschar, Subcutaneous, Tissue Debrided:  Slough Skin/Subcutaneous Tissue Level: 0.66 Debridement A (sq cm): rea Curette Instrument: Minimum  Bleeding: Pressure Hemostasis Achieved: Debridement Treatment Response: Procedure was tolerated well Post Debridement Measurements L x 1.4x0.6x0.1 W x D (cm) 0.066 Post Debridement Volume: (cm) Compression Therapy Procedures Performed: Debridement]  [N/A:N/A N/A N/A N/A N/A N/A N/A N/A N/A N/A N/A N/A N/A N/A N/A N/A N/A N/A N/A N/A N/A N/A N/A N/A N/A N/A N/A N/A N/A N/A] Treatment Notes Electronic Signature(s) Signed: 08/06/2023 10:30:31 AM By: [name] Entered By: Duanne Guess on 08/06/2023 07:23:48 -------------------------------------------------------------------------------- Multi-Disciplinary Care Plan Details Patient Name: Date of Service: [name], [name] 08/06/2023 10:00 A [name], Arlind H ([id number]) [id number] Page 4 of 7 Medical Record Number: [id number] Patient Account Number: [id number] Date of Birth/Sex: Treating RN: 1933-08-19 (89 y.o. Cline Cools Primary Care Kirke Breach: Tana Conch Other Clinician: Referring Niah Heinle: Treating Rorey Bisson/Extender: Arvin Collard in Treatment: 10 Multidisciplinary Care Plan reviewed with physician Active Inactive Venous Leg Ulcer Nursing Diagnoses: Knowledge deficit related to disease process and management Potential for venous Insuffiency (use before diagnosis confirmed) Goals: Patient will maintain optimal edema control Date Initiated: 06/18/2023 Target Resolution Date: 09/13/2023 Goal Status: Active Interventions: Assess peripheral edema status every visit. Compression as ordered Treatment Activities: Therapeutic compression applied : 06/18/2023 Notes: Wound/Skin Impairment Nursing Diagnoses: Knowledge deficit related to smoking impact on wound healing Goals: Patient/caregiver will verbalize understanding of skin care regimen Date Initiated:  05/27/2023 Target Resolution Date: 09/13/2023 Goal Status: Active Interventions: Assess patient/caregiver ability to obtain necessary supplies Assess patient/caregiver ability to perform ulcer/skin care regimen upon admission and as needed Assess ulceration(s) every visit Provide education on ulcer and skin care Screen for HBO Treatment Activities: Skin care regimen initiated : 05/27/2023 Topical wound management initiated : 05/27/2023 Notes: Electronic Signature(s) Signed: 08/06/2023 12:06:25 PM By: Redmond Pulling RN, BSN Entered By: Redmond Pulling on 08/06/2023 07:06:10 -------------------------------------------------------------------------------- Pain Assessment Details Patient Name: Date of Service: [name], [name] 08/06/2023 10:00 A M Medical Record Number: [id number] Patient Account Number: [id number] Date of Birth/Sex: Treating RN: [birth year] (89 y.o. M) Primary Care Marvell Stavola: Tana Conch Other Clinician: Referring Rahiem Schellinger: Treating Kalicia Dufresne/Extender: Arvin Collard in Treatment: [address] [place], Louis H ([id number]) [id number] Page 5 of 7 Location of Pain Severity and Description of Pain Patient Has Paino No Site Locations Pain Management and Medication Current Pain Management: Electronic Signature(s) Signed: 08/06/2023 11:17:23 AM By: Dayton Scrape Entered By: Dayton Scrape on 08/06/2023 06:59:56 -------------------------------------------------------------------------------- Patient/Caregiver Education Details Patient Name: Date of Service: [name], [name] 12/24/2024andnbsp10:00 A M Medical Record Number: [id number]  Patient Account Number: [id number] Date of Birth/Gender: Treating RN: 10/21/1933 (89 y.o. Cline Cools Primary Care Physician: Tana Conch Other Clinician: Referring Physician: Treating Physician/Extender: Arvin Collard in Treatment: 10 Education  Assessment Education Provided To: Patient Education Topics Provided Wound/Skin Impairment: Methods: Explain/Verbal Responses: State content correctly [employer]) Signed: 08/06/2023 12:06:25 PM By: Redmond Pulling RN, BSN Entered By: Redmond Pulling on 08/06/2023 16:10:96 Wound Assessment Details -------------------------------------------------------------------------------- [name] ([id number]) [id number] Page 6 of 7 Patient Name: Date of Service: [name] ID H. 08/06/2023 10:00 A M Medical Record Number: [id number] Patient Account Number: [id number] Date of Birth/Sex: Treating RN: Mar 02, 1934 (89 y.o. M) Primary Care Da Authement: Tana Conch Other Clinician: Referring Ainhoa Rallo: Treating Kreston Ahrendt/Extender: Arvin Collard in Treatment: 10 Wound Status Wound Number: 1 Primary Etiology: Trauma, Other Wound Location: Left, Lateral Lower Leg Wound Status: Open Wounding Event: Trauma Date Acquired: 05/14/2023 Weeks Of Treatment: 10 Clustered Wound: No Wound Measurements Length: (cm) 1.4 % Reduction in Area: 98.8% Width: (cm) 0.6 % Reduction in Volume: 99.4% Depth: (cm) 0.1 Area: (cm) 0.66 Volume: (cm) 0.066 Wound Description Classification: Full Thickness Without Exposed Support Structures Exudate Amount: Medium Exudate Type: Serosanguineous Exudate Color: red, brown Periwound Skin Texture Texture Color No Abnormalities Noted: No No Abnormalities Noted: No Moisture No Abnormalities Noted: No Treatment Notes Wound #1 (Lower Leg) Wound Laterality: Left, Lateral Cleanser Soap and Water Discharge Instruction: May shower and wash wound with dial antibacterial soap and water prior to dressing change. Wound Cleanser Discharge Instruction: Cleanse the wound with wound cleanser prior to applying a clean dressing using gauze sponges, not tissue or cotton balls. Peri-Wound Care Sween Lotion (Moisturizing  lotion) Discharge Instruction: Apply moisturizing lotion as directed Topical Primary Dressing Hydrofera Blue Ready Transfer Foam, 2.5x2.5 (in/in) Discharge Instruction: Apply directly to wound bed as directed Secondary Dressing ABD Pad, 8x10 Discharge Instruction: Apply over primary dressing as directed. Secured With Compression Wrap Urgo K2 Lite, (equivalent to a 3 layer) two layer compression system, regular Discharge Instruction: Apply Urgo K2 Lite as directed (alternative to 3 layer compression). Compression Stockings Add-Ons Electronic Signature(s) Signed: 08/06/2023 11:17:23 AM By: Dayton Scrape Entered By: Dayton Scrape on 08/06/2023 07:02:47 Spargo, Kaidyn H ([id number]) [id number] Page 7 of 7 -------------------------------------------------------------------------------- Vitals Details Patient Name: Date of Service: HA [place] Dense ID H. 08/06/2023 10:00 A M Medical Record Number: [id number] Patient Account Number: [id number] Date of Birth/Sex: Treating RN: 1933-12-30 (89 y.o. M) Primary Care Arlena Marsan: Tana Conch Other Clinician: Referring Macguire Holsinger: Treating Samary Shatz/Extender: Arvin Collard in Treatment: 10 Vital Signs Time Taken: 09:59 Temperature (F): 97.6 Height (in): 75 Pulse (bpm): 80 Weight (lbs): 238 Respiratory Rate (breaths/min): 18 Body Mass Index (BMI): 29.7 Blood Pressure (mmHg): 122/72 Reference Range: 80 - 120 mg / dl Electronic Signature(s) Signed: 08/06/2023 11:17:23 AM By: Dayton Scrape Entered By: Dayton Scrape on 08/06/2023 06:59:50

## 2023-08-13 ENCOUNTER — Encounter (HOSPITAL_BASED_OUTPATIENT_CLINIC_OR_DEPARTMENT_OTHER): Payer: Medicare Other | Admitting: General Surgery

## 2023-08-13 DIAGNOSIS — I2510 Atherosclerotic heart disease of native coronary artery without angina pectoris: Secondary | ICD-10-CM | POA: Diagnosis not present

## 2023-08-13 DIAGNOSIS — N189 Chronic kidney disease, unspecified: Secondary | ICD-10-CM | POA: Diagnosis not present

## 2023-08-13 DIAGNOSIS — Z7901 Long term (current) use of anticoagulants: Secondary | ICD-10-CM | POA: Diagnosis not present

## 2023-08-13 DIAGNOSIS — I495 Sick sinus syndrome: Secondary | ICD-10-CM | POA: Diagnosis not present

## 2023-08-13 DIAGNOSIS — L97828 Non-pressure chronic ulcer of other part of left lower leg with other specified severity: Secondary | ICD-10-CM | POA: Diagnosis not present

## 2023-08-13 DIAGNOSIS — I129 Hypertensive chronic kidney disease with stage 1 through stage 4 chronic kidney disease, or unspecified chronic kidney disease: Secondary | ICD-10-CM | POA: Diagnosis not present

## 2023-08-13 DIAGNOSIS — S81802A Unspecified open wound, left lower leg, initial encounter: Secondary | ICD-10-CM | POA: Diagnosis not present

## 2023-08-14 NOTE — Progress Notes (Signed)
 [name], [name] ([id number]) [id number] Page 1 of 5 Visit Report for 08/13/2023 Chief Complaint Document Details Patient Name: Date of Service: [name] ID H. 08/13/2023 10:00 A M Medical Record Number: [id number] Patient Account Number: [id number] Date of Birth/Sex: Treating RN: January 29, 1934 (89 y.o. M) Primary Care Provider: Katrinka [name] Other Clinician: Referring Provider: Treating Provider/Extender: [name] [name] [name] in Treatment: 11 Information Obtained from: Patient Chief Complaint 05/27/2023; patient comes in today with a traumatic wound on the left anterior lower leg Electronic Signature(s) Signed: 08/13/2023 12:57:02 PM By: Richard Delon MD FACS Entered By: [name] on 08/13/2023 10:12:21 -------------------------------------------------------------------------------- HPI Details Patient Name: Date of Service: [name] ID H. 08/13/2023 10:00 A M Medical Record Number: [id number] Patient Account Number: [id number] Date of Birth/Sex: Treating RN: 07/12/1934 (89 y.o. M) Primary Care Provider: Katrinka [name] Other Clinician: Referring Provider: Treating Provider/Extender: [name] [name] [name] in Treatment: 11 History of Present Illness HPI Description: ADMISSION 05/27/2023; The patient is a 89 year old man who traumatized his left leg on a bed frame in [place]  on . The wound was initially repaired in the ER with 17 sutures. He has been seen twice in urgent care on 10/8. He was given mupirocin and doxycycline . He was again seen in urgent care on 10/10 felt to have cellulitis and had Cipro added to the doxycycline  which she is still taking. The patient is on Eliquis  for history of sick sinus syndrome and atrial fibrillation. The patient is not complaining of a lot of pain. He is able to show me pictures of progressive skin necrosis just lateral to the original wound. Past medical  history includes sick sinus syndrome, coronary artery disease, aortic stenosis, chronic kidney disease. He is on Eliquis  ABI in our clinic at 1.04 on the right and 1.06 on the left 06/03/2023: The cellulitis is still fairly persistent. There are multiple sutures still intact but they are pulling through the soft tissue. There is an odor coming from the wound. 06/11/2023: The wound is much cleaner this week. There is no longer any odor. There is still a fair amount of slough present with eschar around the edges. The culture that I took last week returned with E. coli and staph species, sensitive to the Levaquin  I had prescribed. He is currently taking this. 06/18/2023: The wound is very clean this week but the granulation tissue is quite hypertrophic. There is no odor. Edema control is good. The wound VAC is being delivered today. 06/25/2023: The wound has responded nicely to negative pressure wound therapy. It has contracted quite a bit. The hypertrophic granulation tissue has reaccumulated, however. 07/02/2023: The wound is smaller again this week. The hypertrophic granulation tissue has built up again. Edema control is good. 07/08/2023: The wound continues to contract. Edema control is good. The hypertrophic granulation tissue has reaccumulated, but not to the extent that it has previously. 07/30/2023: The wound has decreased by at least half the size of when I last saw it. It is flush with the surrounding skin. There is a bit of slough accumulation and the granulation tissue is somewhat hypertrophic. Richard, [name] ([id number]) [id number] Page 2 of 5 08/06/2023: There has been more epithelialization of the wound. There is some thin brittle eschar and slough on the surface. No concern for infection and no reaccumulation of the hypertrophic granulation tissue. 08/13/2023: His wound is healed. Electronic Signature(s) Signed: 08/13/2023 12:57:02 PM By: Richard Delon  MD FACS Entered By: [name] on 08/13/2023  10:12:51 -------------------------------------------------------------------------------- Physical Exam Details Patient Name: Date of Service: [name] ID H. 08/13/2023 10:00 A M Medical Record Number: [id number] Patient Account Number: [id number] Date of Birth/Sex: Treating RN: [birth year] (89 y.o. M) Primary Care Provider: Katrinka [name] Other Clinician: Referring Provider: Treating Provider/Extender: [name] [name] [name] in Treatment: 11 Constitutional . . . . no acute distress. Respiratory Normal work of breathing on room air.. Notes 08/13/2023: His wound is healed. Electronic Signature(s) Signed: 08/13/2023 12:57:02 PM By: Richard Delon MD FACS Entered By: [name] on 08/13/2023 10:13:23 -------------------------------------------------------------------------------- Physician Orders Details Patient Name: Date of Service: [name] ID H. 08/13/2023 10:00 A M Medical Record Number: [id number] Patient Account Number: [id number] Date of Birth/Sex: Treating RN: 1934-05-01 (89 y.o. NETTY [name] Primary Care Provider: Katrinka [name] Other Clinician: Referring Provider: Treating Provider/Extender: [name] [name] [name] in Treatment: 11 The following information was scribed by: [name] The information was scribed for: [name] Verbal / Phone Orders: No Diagnosis Coding ICD-10 Coding Code Description [phone] Non-pressure chronic ulcer of other part of left lower leg with other specified severity S80.12XD Contusion of left lower leg, subsequent encounter T45.515S Adverse effect of anticoagulants, sequela Discharge From [facility] Discharge from Wound Care Center - Congratulations! Bathing/ Shower/ Hygiene May shower and wash wound with soap and water. [name] ([id number]) [id number] Page 3 of 5 Edema Control -  Orders / Instructions Left Lower Extremity Elevate legs to the level of the heart or above for 30 minutes daily and/or when sitting for 3-4 times a day throughout the day. Avoid standing for long periods of time. Exercise regularly Moisturize legs daily. Electronic Signature(s) Signed: 08/13/2023 12:57:02 PM By: Richard Delon MD FACS Signed: 08/13/2023 4:46:54 PM By: Richard Handing RN, BSN Entered By: Boehlein, Linda on 08/13/2023 10:13:43 -------------------------------------------------------------------------------- Problem List Details Patient Name: Date of Service: [name] ID H. 08/13/2023 10:00 A M Medical Record Number: [id number] Patient Account Number: [id number] Date of Birth/Sex: Treating RN: 22-Jul-1934 (89 y.o. NETTY [name] Primary Care Provider: Katrinka [name] Other Clinician: Referring Provider: Treating Provider/Extender: [name] [name] [name] in Treatment: 11 Active Problems ICD-10 Encounter Code Description Active Date MDM Diagnosis L97.828 Non-pressure chronic ulcer of other part of left lower leg with other specified 05/27/2023 No Yes severity S80.12XD Contusion of left lower leg, subsequent encounter 05/27/2023 No Yes T45.515S Adverse effect of anticoagulants, sequela 05/27/2023 No Yes Inactive Problems Resolved Problems ICD-10 Code Description Active Date Resolved Date L03.116 Cellulitis of left lower limb 05/27/2023 05/27/2023 Electronic Signature(s) Signed: 08/13/2023 12:57:02 PM By: Richard Delon MD FACS Entered By: [name] on 08/13/2023 10:11:58 -------------------------------------------------------------------------------- Progress Note Details Patient Name: Date of Service: [name] ID H. 08/13/2023 10:00 A M Medical Record Number: [id number] Patient Account Number: [id number] [name] ([id number]) [phone].pdf Page 4 of 5 Date of Birth/Sex: Treating RN: Feb 06, 1934  (89 y.o. M) Primary Care Provider: Other Clinician: Katrinka [name] Referring Provider: Treating Provider/Extender: [name] [name] [name] in Treatment: 11 Subjective Chief Complaint Information obtained from Patient 05/27/2023; patient comes in today with a traumatic wound on the left anterior lower leg History of Present Illness (HPI) ADMISSION 05/27/2023; The patient is a 89 year old man who traumatized his left leg on a bed frame in [place]  on . The wound was initially repaired in the ER with 17 sutures. He has been seen twice in urgent care on 10/8. He was given mupirocin and doxycycline . He was again  seen in urgent care on 10/10 felt to have cellulitis and had Cipro added to the doxycycline  which she is still taking. The patient is on Eliquis  for history of sick sinus syndrome and atrial fibrillation. The patient is not complaining of a lot of pain. He is able to show me pictures of progressive skin necrosis just lateral to the original wound. Past medical history includes sick sinus syndrome, coronary artery disease, aortic stenosis, chronic kidney disease. He is on Eliquis  ABI in our clinic at 1.04 on the right and 1.06 on the left 06/03/2023: The cellulitis is still fairly persistent. There are multiple sutures still intact but they are pulling through the soft tissue. There is an odor coming from the wound. 06/11/2023: The wound is much cleaner this week. There is no longer any odor. There is still a fair amount of slough present with eschar around the edges. The culture that I took last week returned with E. coli and staph species, sensitive to the Levaquin  I had prescribed. He is currently taking this. 06/18/2023: The wound is very clean this week but the granulation tissue is quite hypertrophic. There is no odor. Edema control is good. The wound VAC is being delivered today. 06/25/2023: The wound has responded nicely to negative pressure wound therapy. It  has contracted quite a bit. The hypertrophic granulation tissue has reaccumulated, however. 07/02/2023: The wound is smaller again this week. The hypertrophic granulation tissue has built up again. Edema control is good. 07/08/2023: The wound continues to contract. Edema control is good. The hypertrophic granulation tissue has reaccumulated, but not to the extent that it has previously. 07/30/2023: The wound has decreased by at least half the size of when I last saw it. It is flush with the surrounding skin. There is a bit of slough accumulation and the granulation tissue is somewhat hypertrophic. 08/06/2023: There has been more epithelialization of the wound. There is some thin brittle eschar and slough on the surface. No concern for infection and no reaccumulation of the hypertrophic granulation tissue. 08/13/2023: His wound is healed. Objective Constitutional no acute distress. Vitals Time Taken: 9:55 AM, Height: 75 in, Weight: 238 lbs, BMI: 29.7, Temperature: 97.7 F, Pulse: 76 bpm, Respiratory Rate: 18 breaths/min, Blood Pressure: 109/63 mmHg. Respiratory Normal work of breathing on room air.. General Notes: 08/13/2023: His wound is healed. Integumentary (Hair, Skin) Wound #1 status is Healed - Epithelialized. Original cause of wound was Trauma. The date acquired was: 05/14/2023. The wound has been in treatment 11 weeks. The wound is located on the Left,Lateral Lower Leg. The wound measures 0cm length x 0cm width x 0cm depth; 0cm^2 area and 0cm^3 volume. There is Fat Layer (Subcutaneous Tissue) exposed. There is no tunneling or undermining noted. There is a small amount of serosanguineous drainage noted. The wound margin is flat and intact. There is small (1-33%) red granulation within the wound bed. There is no necrotic tissue within the wound bed. The periwound skin appearance had no abnormalities noted for moisture. The periwound skin appearance had no abnormalities noted for color. The  periwound skin appearance exhibited: Scarring. Periwound temperature was noted as No Abnormality. Assessment [name], [name] ([id number]) [id number] Page 5 of 5 Active Problems ICD-10 Non-pressure chronic ulcer of other part of left lower leg with other specified severity Contusion of left lower leg, subsequent encounter Adverse effect of anticoagulants, sequela Plan 08/13/2023: His wound is healed. I have suggested that he keep the area covered with a simple bandage for the next week  or so to protect the freshly healed site. We will discharge him from the wound care center. He may follow-up in the future, should the need arise. Electronic Signature(s) Signed: 08/13/2023 12:57:02 PM By: Richard Nest MD FACS Entered By: Richard Nest on 08/13/2023 10:14:05 -------------------------------------------------------------------------------- SuperBill Details Patient Name: Date of Service: [name] ID H. 08/13/2023 Medical Record Number: [id number] Patient Account Number: [id number] Date of Birth/Sex: Treating RN: [birth year] (89 y.o. M) Primary Care Provider: Katrinka [name] Other Clinician: Referring Provider: Treating Provider/Extender: Richard Nest [name] [name] in Treatment: 11 Diagnosis Coding ICD-10 Codes Code Description [phone] Non-pressure chronic ulcer of other part of left lower leg with other specified severity S80.12XD Contusion of left lower leg, subsequent encounter T45.515S Adverse effect of anticoagulants, sequela Facility Procedures : CPT4 Code: [id number] Description: 99213 - WOUND CARE VISIT-LEV 3 EST PT Modifier: Quantity: 1 Physician Procedures : CPT4 Code Description Modifier [id number] - WC PHYS LEVEL 2 - EST PT ICD-10 Diagnosis Description L97.828 Non-pressure chronic ulcer of other part of left lower leg with other specified severity S80.12XD Contusion of left lower leg, subsequent  encounter T45.515S  Adverse effect of anticoagulants, sequela Quantity: 1 Electronic Signature(s) Signed: 08/13/2023 12:57:02 PM By: Richard Nest MD FACS Signed: 08/13/2023 4:46:54 PM By: Richard Handing RN, BSN Entered By: [name] on 08/13/2023 10:15:44

## 2023-08-14 NOTE — Progress Notes (Signed)
 TYNELL, WINCHELL ([id number]) [id number] Page 1 of 8 Visit Report for 08/13/2023 Arrival Information Details Patient Name: Date of Service: [name] [name]. 08/13/2023 10:00 A M Medical Record Number: [id number] Patient Account Number: [id number] Date of Birth/Sex: Treating RN: 04/19/1934 (89 y.o. M) Primary Care Auria Mckinlay: Katrinka Senior Other Clinician: Referring Jeannelle Wiens: Treating Lular Letson/Extender: Marolyn Delon Katrinka Senior Devra in Treatment: 11 Visit Information History Since Last Visit Added or deleted any medications: No Patient Arrived: Ambulatory Any new allergies or adverse reactions: No Arrival Time: 09:55 Had a fall or experienced change in No Accompanied By: self activities of daily living that may affect Transfer Assistance: None risk of falls: Patient Identification Verified: Yes Signs or symptoms of abuse/neglect since last visito No Secondary Verification Process Completed: Yes Hospitalized since last visit: No Patient Requires Transmission-Based Precautions: No Implantable device outside of the clinic excluding No Patient Has Alerts: No cellular tissue based products placed in the center since last visit: Has Dressing in Place as Prescribed: Yes Has Compression in Place as Prescribed: Yes Pain Present Now: No Electronic Signature(s) Signed: 08/13/2023 4:46:54 PM By: Merleen Handing RN, BSN Entered By: Boehlein, Linda on 08/13/2023 10:05:46 -------------------------------------------------------------------------------- Clinic Level of Care Assessment Details Patient Name: Date of Service: [name], Richard V [name]. 08/13/2023 10:00 A M Medical Record Number: [id number] Patient Account Number: [id number] Date of Birth/Sex: Treating RN: 1933-12-04 (89 y.o. [name] Merleen Handing Primary Care Verona Hartshorn: Katrinka Senior Other Clinician: Referring Thaine Garriga: Treating Gurney Balthazor/Extender: Marolyn Delon Katrinka Senior Devra in Treatment:  11 Clinic Level of Care Assessment Items TOOL 4 Quantity Score []  - 0 Use when only an EandM is performed on FOLLOW-UP visit ASSESSMENTS - Nursing Assessment / Reassessment X- 1 10 Reassessment of Co-morbidities (includes updates in patient status) X- 1 5 Reassessment of Adherence to Treatment Plan ASSESSMENTS - Wound and Skin A ssessment / Reassessment X - Simple Wound Assessment / Reassessment - one wound 1 5 []  - 0 Complex Wound Assessment / Reassessment - multiple wounds []  - 0 Dermatologic / Skin Assessment (not related to wound area) ASSESSMENTS - Focused Assessment X- 1 5 Circumferential Edema Measurements - multi extremities []  - 0 Nutritional Assessment / Counseling / Intervention [name], [name] ([id number]) [id number] Page 2 of 8 X- 1 5 Lower Extremity Assessment (monofilament, tuning fork, pulses) []  - 0 Peripheral Arterial Disease Assessment (using hand held doppler) ASSESSMENTS - Ostomy and/or Continence Assessment and Care []  - 0 Incontinence Assessment and Management []  - 0 Ostomy Care Assessment and Management (repouching, etc.) PROCESS - Coordination of Care X - Simple Patient / Family Education for ongoing care 1 15 []  - 0 Complex (extensive) Patient / Family Education for ongoing care X- 1 10 Staff obtains Chiropractor, Records, T Results / Process Orders est []  - 0 Staff telephones HHA, Nursing Homes / Clarify orders / etc []  - 0 Routine Transfer to another Facility (non-emergent condition) []  - 0 Routine Hospital Admission (non-emergent condition) []  - 0 New Admissions / Manufacturing Engineer / Ordering NPWT Apligraf, etc. , []  - 0 Emergency Hospital Admission (emergent condition) X- 1 10 Simple Discharge Coordination []  - 0 Complex (extensive) Discharge Coordination PROCESS - Special Needs []  - 0 Pediatric / Minor Patient Management []  - 0 Isolation Patient Management []  - 0 Hearing / Language / Visual special  needs []  - 0 Assessment of Community assistance (transportation, D/C planning, etc.) []  - 0 Additional assistance / Altered mentation []  - 0 Support Surface(s) Assessment (bed, cushion, seat, etc.)  INTERVENTIONS - Wound Cleansing / Measurement X - Simple Wound Cleansing - one wound 1 5 []  - 0 Complex Wound Cleansing - multiple wounds X- 1 5 Wound Imaging (photographs - any number of wounds) []  - 0 Wound Tracing (instead of photographs) X- 1 5 Simple Wound Measurement - one wound []  - 0 Complex Wound Measurement - multiple wounds INTERVENTIONS - Wound Dressings X - Small Wound Dressing one or multiple wounds 1 10 []  - 0 Medium Wound Dressing one or multiple wounds []  - 0 Large Wound Dressing one or multiple wounds []  - 0 Application of Medications - topical []  - 0 Application of Medications - injection INTERVENTIONS - Miscellaneous []  - 0 External ear exam []  - 0 Specimen Collection (cultures, biopsies, blood, body fluids, etc.) []  - 0 Specimen(s) / Culture(s) sent or taken to Lab for analysis []  - 0 Patient Transfer (multiple staff / Nurse, Adult / Similar devices) []  - 0 Simple Staple / Suture removal (25 or less) []  - 0 Complex Staple / Suture removal (26 or more) []  - 0 Hypo / Hyperglycemic Management (close monitor of Blood Glucose) [name], [name] ([id number]) [id number] Page 3 of 8 []  - 0 Ankle / Brachial Index (ABI) - do not check if billed separately X- 1 5 Vital Signs Has the patient been seen at the hospital within the last three years: Yes Total Score: 95 Level Of Care: New/Established - Level 3 Electronic Signature(s) Signed: 08/13/2023 4:46:54 PM By: Merleen Handing RN, BSN Entered By: Boehlein, Linda on 08/13/2023 10:15:35 -------------------------------------------------------------------------------- Encounter Discharge Information Details Patient Name: Date of Service: [name], Richard V [name]. 08/13/2023 10:00 A M Medical  Record Number: [id number] Patient Account Number: [id number] Date of Birth/Sex: Treating RN: 10-Sep-1933 (89 y.o. [name] Merleen Handing Primary Care Eleyna Brugh: Katrinka Senior Other Clinician: Referring Kerie Badger: Treating Shaletta Hinostroza/Extender: Marolyn Delon Katrinka Senior Devra in Treatment: 11 Encounter Discharge Information Items Discharge Condition: Stable Ambulatory Status: Ambulatory Discharge Destination: Home Transportation: Private Auto Accompanied By: self Schedule Follow-up Appointment: Yes Clinical Summary of Care: Patient Declined Electronic Signature(s) Signed: 08/13/2023 4:46:54 PM By: Merleen Handing RN, BSN Entered By: Merleen Handing on 08/13/2023 10:16:14 -------------------------------------------------------------------------------- Lower Extremity Assessment Details Patient Name: Date of Service: [name], Richard V [name]. 08/13/2023 10:00 A M Medical Record Number: [id number] Patient Account Number: [id number] Date of Birth/Sex: Treating RN: [birth year] (89 y.o. [name] Merleen Handing Primary Care Abelino Tippin: Katrinka Senior Other Clinician: Referring Rorey Bisson: Treating Tommie Bohlken/Extender: Marolyn Delon Katrinka Senior Devra in Treatment: 11 Edema Assessment Assessed: Colletta: No] Glenis: No] Edema: [Left: Ye] [Right: s] Calf Left: Right: Point of Measurement: From Medial Instep 37.5 cm Ankle Left: Right: Point of Measurement: From Medial Instep 25.5 cm Vascular Assessment Left: [[id number] Page 4 of 8Right:] Pulses: Dorsalis Pedis Palpable: [[id number] Page 4 of 8Yes] Extremity colors, hair growth, and conditions: Extremity Color: [id number] Page 4 of 8Hyperpigmented] Hair Growth on Extremity: [[id number] Page 4 of 8No] Temperature of Extremity: [[id number] Page 4 of 8Warm] Capillary Refill: [id number]  Page 4 of 8< 3 seconds] Dependent Rubor: [[id number] Page 4 of 8No No] Electronic Signature(s) Signed: 08/13/2023 4:46:54 PM By: Merleen Handing RN, BSN Entered By: Boehlein, Linda on 08/13/2023 10:06:58 -------------------------------------------------------------------------------- Multi Wound Chart Details Patient Name: Date of Service: [name], Richard V [name]. 08/13/2023 10:00 A M Medical Record Number: [id number] Patient Account Number: [id number] Date of Birth/Sex: Treating RN: Aug 19, 1933 (89 y.o. M) Primary Care Song Garris: Katrinka Senior Other Clinician: Referring Leovanni Bjorkman: Treating Karys Meckley/Extender: Marolyn Delon Katrinka Senior Devra in Treatment:  11 Vital Signs Height(in): 75 Pulse(bpm): 76 Weight(lbs): 238 Blood Pressure(mmHg): 109/63 Body Mass Index(BMI): 29.7 Temperature(F): 97.7 Respiratory Rate(breaths/min): 18 [1:Photos:] [N/A:N/A] Left, Lateral Lower Leg N/A N/A Wound Location: Trauma N/A N/A Wounding Event: Trauma, Other N/A N/A Primary Etiology: Arrhythmia, Congestive Heart Failure, N/A N/A Comorbid History: Coronary Artery Disease, Hypertension, Myocardial Infarction 05/14/2023 N/A N/A Date Acquired: 11 N/A N/A Weeks of Treatment: Healed - Epithelialized N/A N/A Wound Status: No N/A N/A Wound Recurrence: 0x0x0 N/A N/A Measurements L x W x D (cm) 0 N/A N/A A (cm) : rea 0 N/A N/A Volume (cm) : 100.00% N/A N/A % Reduction in Area: 100.00% N/A N/A % Reduction in Volume: Full Thickness Without Exposed N/A N/A Classification: Support Structures Small N/A N/A Exudate Amount: Serosanguineous N/A N/A Exudate Type: red, brown N/A N/A Exudate Color: Flat and Intact N/A N/A Wound Margin: Small (1-33%) N/A N/A Granulation Amount: Red N/A N/A Granulation Quality: None Present (0%) N/A N/A Necrotic Amount: Fat Layer (Subcutaneous Tissue): Yes N/A N/A Exposed Structures: Fascia: No [name], [name] ([id number])  [id number] Page 5 of 8 Tendon: No Muscle: No Joint: No Bone: No Large (67-100%) N/A N/A Epithelialization: Scarring: Yes N/A N/A Periwound Skin Texture: No Abnormalities Noted N/A N/A Periwound Skin Moisture: No Abnormalities Noted N/A N/A Periwound Skin Color: No Abnormality N/A N/A Temperature: Treatment Notes Electronic Signature(s) Signed: 08/13/2023 12:57:02 PM By: Marolyn Nest MD FACS Entered By: Marolyn Nest on 08/13/2023 10:12:14 -------------------------------------------------------------------------------- Multi-Disciplinary Care Plan Details Patient Name: Date of Service: [name], Richard V [name]. 08/13/2023 10:00 A M Medical Record Number: [id number] Patient Account Number: [id number] Date of Birth/Sex: Treating RN: May 30, 1934 (89 y.o. [name] Merleen Handing Primary Care Nahjae Hoeg: Katrinka Senior Other Clinician: Referring Sora Olivo: Treating Anay Walter/Extender: Marolyn Nest Katrinka Senior Devra in Treatment: 11 Multidisciplinary Care Plan reviewed with physician Active Inactive Electronic Signature(s) Signed: 08/13/2023 4:46:54 PM By: Merleen Handing RN, BSN Entered By: Boehlein, Linda on 08/13/2023 10:14:54 -------------------------------------------------------------------------------- Pain Assessment Details Patient Name: Date of Service: [name], Richard V [name]. 08/13/2023 10:00 A M Medical Record Number: [id number] Patient Account Number: [id number] Date of Birth/Sex: Treating RN: Nov 03, 1933 (89 y.o. M) Primary Care Wilkins Elpers: Katrinka Senior Other Clinician: Referring Jarquavious Fentress: Treating Guinn Delarosa/Extender: Marolyn Nest Katrinka Senior Devra in Treatment: 11 Active Problems Location of Pain Severity and Description of Pain Patient Has Paino No Site Locations Rate the pain. ARDITH, LEWMAN ([id number]) [id number] Page 6 of 8 Rate the pain. Current Pain Level: 0 Pain Management and  Medication Current Pain Management: Electronic Signature(s) Signed: 08/13/2023 4:46:54 PM By: Merleen Handing RN, BSN Entered By: Merleen Handing on 08/13/2023 10:06:06 -------------------------------------------------------------------------------- Patient/Caregiver Education Details Patient Name: Date of Service: [name] BENNA HILARIO 12/31/2024andnbsp10:00 A M Medical Record Number: [id number] Patient Account Number: [id number] Date of Birth/Gender: Treating RN: 1934/03/26 (89 y.o. [name] Merleen Handing Primary Care Physician: Katrinka Senior Other Clinician: Referring Physician: Treating Physician/Extender: Marolyn Nest Katrinka Senior Devra in Treatment: 11 Education Assessment Education Provided To: Patient Education Topics Provided Venous: Methods: Explain/Verbal Responses: Reinforcements needed, State content correctly Wound/Skin Impairment: Methods: Explain/Verbal Responses: Reinforcements needed, State content correctly Electronic Signature(s) Signed: 08/13/2023 4:46:54 PM By: Merleen Handing RN, BSN Entered By: Boehlein, Linda on 08/13/2023 10:02:55 -------------------------------------------------------------------------------- Wound Assessment Details Patient Name: Date of Service: [name], Richard V [name]. 08/13/2023 10:00 A [name], [name] ([id number]) [id number] Page 7 of 8 Medical Record Number: [id number] Patient Account Number: [id number] Date of Birth/Sex: Treating RN: 1933/12/29 (89 y.o. [name] Merleen Handing Primary Care Ouita Nish:  Katrinka Senior Other Clinician: Referring Anzel Kearse: Treating Lassie Demorest/Extender: Marolyn Delon Katrinka Senior Devra in Treatment: 11 Wound Status Wound Number: 1 Primary Trauma, Other Etiology: Wound Location: Left, Lateral Lower Leg Wound Healed - Epithelialized Wounding Event: Trauma Status: Date Acquired: 05/14/2023 Comorbid Arrhythmia, Congestive Heart Failure, Coronary Artery Disease, Weeks Of  Treatment: 11 History: Hypertension, Myocardial Infarction Clustered Wound: No Photos Wound Measurements Length: (cm) Width: (cm) Depth: (cm) Area: (cm) Volume: (cm) 0 % Reduction in Area: 100% 0 % Reduction in Volume: 100% 0 Epithelialization: Large (67-100%) 0 Tunneling: No 0 Undermining: No Wound Description Classification: Full Thickness Without Exposed Support Structures Wound Margin: Flat and Intact Exudate Amount: Small Exudate Type: Serosanguineous Exudate Color: red, brown Foul Odor After Cleansing: No Slough/Fibrino No Wound Bed Granulation Amount: Small (1-33%) Exposed Structure Granulation Quality: Red Fascia Exposed: No Necrotic Amount: None Present (0%) Fat Layer (Subcutaneous Tissue) Exposed: Yes Tendon Exposed: No Muscle Exposed: No Joint Exposed: No Bone Exposed: No Periwound Skin Texture Texture Color No Abnormalities Noted: No No Abnormalities Noted: Yes Scarring: Yes Temperature / Pain Temperature: No Abnormality Moisture No Abnormalities Noted: Yes Electronic Signature(s) Signed: 08/13/2023 4:46:54 PM By: Merleen Handing RN, BSN Previous Signature: 08/13/2023 10:07:40 AM Version By: Okey Bonier Entered By: Merleen Handing on 08/13/2023 10:12:11 Vitals Details -------------------------------------------------------------------------------- [name] ([id number]) [id number] Page 8 of 8 Patient Name: Date of Service: Richard [name] Richard [name]. 08/13/2023 10:00 A M Medical Record Number: [id number] Patient Account Number: [id number] Date of Birth/Sex: Treating RN: 1934-04-03 (89 y.o. M) Primary Care Jezreel Sisk: Katrinka Senior Other Clinician: Referring Ayris Carano: Treating Janeil Schexnayder/Extender: Marolyn Delon Katrinka Senior Devra in Treatment: 11 Vital Signs Time Taken: 09:55 Temperature (F): 97.7 Height (in): 75 Pulse (bpm): 76 Weight (lbs): 238 Respiratory Rate (breaths/min): 18 Body Mass Index (BMI): 29.7 Blood  Pressure (mmHg): 109/63 Reference Range: 80 - 120 mg / dl Electronic Signature(s) Signed: 08/13/2023 4:46:54 PM By: Merleen Handing RN, BSN Entered By: Boehlein, Linda on 08/13/2023 10:05:53

## 2023-08-20 ENCOUNTER — Ambulatory Visit (HOSPITAL_BASED_OUTPATIENT_CLINIC_OR_DEPARTMENT_OTHER): Payer: Medicare Other | Admitting: General Surgery

## 2023-08-26 ENCOUNTER — Encounter: Payer: Self-pay | Admitting: Cardiology

## 2023-08-26 ENCOUNTER — Ambulatory Visit (HOSPITAL_BASED_OUTPATIENT_CLINIC_OR_DEPARTMENT_OTHER): Payer: Medicare Other | Admitting: General Surgery

## 2023-08-28 ENCOUNTER — Ambulatory Visit: Payer: Medicare Other | Admitting: Physician Assistant

## 2023-09-04 ENCOUNTER — Other Ambulatory Visit: Payer: Self-pay | Admitting: Cardiology

## 2023-09-04 DIAGNOSIS — I2782 Chronic pulmonary embolism: Secondary | ICD-10-CM

## 2023-09-05 NOTE — Telephone Encounter (Signed)
Prescription refill request for Eliquis received. Indication: Afib  Last office visit: 08/18/22 Keitha Butte)  Scr: 1.87 (05/26/23)  Age: 89 Weight: 104.3kg  Appropriate dose. Refill sent.

## 2023-10-14 ENCOUNTER — Other Ambulatory Visit: Payer: Self-pay | Admitting: Cardiology

## 2023-10-15 ENCOUNTER — Ambulatory Visit (INDEPENDENT_AMBULATORY_CARE_PROVIDER_SITE_OTHER): Payer: Medicare Other

## 2023-10-15 DIAGNOSIS — I495 Sick sinus syndrome: Secondary | ICD-10-CM | POA: Diagnosis not present

## 2023-10-16 LAB — CUP PACEART REMOTE DEVICE CHECK
Battery Remaining Longevity: 45 mo
Battery Voltage: 2.88 V
Brady Statistic AP VP Percent: 74.5 %
Brady Statistic AP VS Percent: 23.52 %
Brady Statistic AS VP Percent: 0.54 %
Brady Statistic AS VS Percent: 1.44 %
Brady Statistic RA Percent Paced: 98.95 %
Brady Statistic RV Percent Paced: 75.04 %
Date Time Interrogation Session: 20250228215023
Implantable Lead Connection Status: 753985
Implantable Lead Connection Status: 753985
Implantable Lead Implant Date: 20190116
Implantable Lead Implant Date: 20190116
Implantable Lead Location: 753859
Implantable Lead Location: 753860
Implantable Lead Model: 5076
Implantable Lead Model: 5076
Implantable Pulse Generator Implant Date: 20190116
Lead Channel Impedance Value: 285 Ohm
Lead Channel Impedance Value: 304 Ohm
Lead Channel Impedance Value: 342 Ohm
Lead Channel Impedance Value: 399 Ohm
Lead Channel Pacing Threshold Amplitude: 1 V
Lead Channel Pacing Threshold Amplitude: 1.125 V
Lead Channel Pacing Threshold Pulse Width: 0.4 ms
Lead Channel Pacing Threshold Pulse Width: 0.4 ms
Lead Channel Sensing Intrinsic Amplitude: 0.25 mV
Lead Channel Sensing Intrinsic Amplitude: 0.25 mV
Lead Channel Sensing Intrinsic Amplitude: 7.25 mV
Lead Channel Sensing Intrinsic Amplitude: 7.25 mV
Lead Channel Setting Pacing Amplitude: 2 V
Lead Channel Setting Pacing Amplitude: 2.25 V
Lead Channel Setting Pacing Pulse Width: 0.4 ms
Lead Channel Setting Sensing Sensitivity: 1.2 mV
Zone Setting Status: 755011
Zone Setting Status: 755011

## 2023-10-21 ENCOUNTER — Other Ambulatory Visit: Payer: Self-pay | Admitting: Cardiology

## 2023-10-21 ENCOUNTER — Other Ambulatory Visit: Payer: Self-pay | Admitting: Family Medicine

## 2023-11-04 ENCOUNTER — Ambulatory Visit: Payer: Medicare Other | Attending: Cardiology | Admitting: Cardiology

## 2023-11-04 ENCOUNTER — Encounter: Payer: Self-pay | Admitting: Cardiology

## 2023-11-04 VITALS — BP 110/86 | HR 88 | Ht 75.0 in | Wt 229.0 lb

## 2023-11-04 DIAGNOSIS — I350 Nonrheumatic aortic (valve) stenosis: Secondary | ICD-10-CM | POA: Diagnosis not present

## 2023-11-04 DIAGNOSIS — I4729 Other ventricular tachycardia: Secondary | ICD-10-CM | POA: Insufficient documentation

## 2023-11-04 DIAGNOSIS — Z950 Presence of cardiac pacemaker: Secondary | ICD-10-CM | POA: Insufficient documentation

## 2023-11-04 DIAGNOSIS — I2510 Atherosclerotic heart disease of native coronary artery without angina pectoris: Secondary | ICD-10-CM | POA: Diagnosis not present

## 2023-11-04 DIAGNOSIS — I480 Paroxysmal atrial fibrillation: Secondary | ICD-10-CM | POA: Diagnosis not present

## 2023-11-04 DIAGNOSIS — I77810 Thoracic aortic ectasia: Secondary | ICD-10-CM | POA: Insufficient documentation

## 2023-11-04 DIAGNOSIS — I495 Sick sinus syndrome: Secondary | ICD-10-CM | POA: Diagnosis not present

## 2023-11-04 NOTE — Patient Instructions (Signed)
 Medication Instructions:  The current medical regimen is effective;  continue present plan and medications.  *If you need a refill on your cardiac medications before your next appointment, please call your pharmacy*  Testing/Procedures: Your physician has requested that you have an echocardiogram. Echocardiography is a painless test that uses sound waves to create images of your heart. It provides your doctor with information about the size and shape of your heart and how well your heart's chambers and valves are working. This procedure takes approximately one hour. There are no restrictions for this procedure. Please do NOT wear cologne, perfume, aftershave, or lotions (deodorant is allowed). Please arrive 15 minutes prior to your appointment time.  Please note: We ask at that you not bring children with you during ultrasound (echo/ vascular) testing. Due to room size and safety concerns, children are not allowed in the ultrasound rooms during exams. Our front office staff cannot provide observation of children in our lobby area while testing is being conducted. An adult accompanying a patient to their appointment will only be allowed in the ultrasound room at the discretion of the ultrasound technician under special circumstances. We apologize for any inconvenience.  Follow-Up: At [facility], you and your health needs are our priority.  As part of our continuing mission to provide you with exceptional heart care, we have created designated Provider Care Teams.  These Care Teams include your primary Cardiologist (physician) and Advanced Practice Providers (APPs -  Physician Assistants and Nurse Practitioners) who all work together to provide you with the care you need, when you need it.  We recommend signing up for the patient portal called "MyChart".  Sign up information is provided on this After Visit Summary.  MyChart is used to connect with patients for Virtual Visits (Telemedicine).   Patients are able to view lab/test results, encounter notes, upcoming appointments, etc.  Non-urgent messages can be sent to your provider as well.   To learn more about what you can do with MyChart, go to [email or web address].    Your next appointment:   1 year(s)  Provider:   Donato Schultz, MD       1st Floor: - Lobby - Registration  - Pharmacy  - Lab - Cafe  2nd Floor: - PV Lab - Diagnostic Testing (echo, CT, nuclear med)  3rd Floor: - Vacant  4th Floor: - TCTS (cardiothoracic surgery) - AFib Clinic - Structural Heart Clinic - Vascular Surgery  - Vascular Ultrasound  5th Floor: - HeartCare Cardiology (general and EP) - Clinical Pharmacy for coumadin, hypertension, lipid, weight-loss medications, and med management appointments    Valet parking services will be available as well.

## 2023-11-04 NOTE — Progress Notes (Signed)
 Cardiology Office Note:  .   Date:  11/04/2023  ID:  [name], DOB 1933/12/11, MRN [id number] PCP: Shelva Majestic, MD  [facility] Providers Cardiologist:  Donato Schultz, MD Electrophysiologist:  Will Jorja Loa, MD     History of Present Illness: .   [name] is a 89 y.o. male Discussed the use of AI scribe software for clinical note transcription with the patient, who gave verbal consent to proceed.  History of Present Illness [name] is an 89 year old male with coronary artery disease and chronic kidney disease who presents with shortness of breath.  He experiences shortness of breath, which he attributes to an elevated diaphragm. The symptom has been persistent, but he does not provide a specific timeline for its onset or progression. It is bothersome, but he does not elaborate on its impact on daily activities.  He has a history of coronary artery disease with percutaneous coronary intervention and drug-eluting stents to the right coronary artery and left anterior descending artery. A left heart catheterization in 2020 led to continued medical management. He has proximal right coronary artery stent re-stenosis. Current medications include Eliquis 2.5 mg twice a day, atorvastatin 40 mg daily, Lasix 20 mg twice a day, and Toprol 50 mg daily. Amiodarone was discontinued in October 2024 due to prolonged QT syndrome.  He has moderate aortic stenosis, which has been stable. An echocardiogram on October 25, 2021, showed an ascending aorta measuring 39 mm, which is stable. The aortic valve mean gradient is 22 mmHg.  He has a history of paroxysmal atrial fibrillation, first noted during a deep vein thrombosis in 2018, with a recurrent episode in April 2022 while off anticoagulation. His CHADS-VASc score is 4. He is currently on Eliquis 2.5 mg twice a day for anticoagulation, adjusted for renal function.  He has chronic kidney disease stage four, with a creatinine  level of 1.9, necessitating a dose adjustment of Eliquis. He also has a history of renal calculi, hypertension, hyperlipidemia, and a pacemaker for sick sinus syndrome, which is functioning well with no non-sustained ventricular tachycardia on the device.  Socially, he spends a lot of time at his [place] house in [place], which he has maintained since 1974. He owns a recumbent bicycle, which he acknowledges has 'a little dust on it right now.' He has a 2001 Honda CRV with 140,000 miles, which he uses for travel to the beach.      ROS: no cp  Studies Reviewed: .        Results LABS Creatinine: 1.9 LDL: 58  DIAGNOSTIC Echocardiogram: Ascending aorta 39 mm stable, aortic valve mean gradient 22 mmHg (10/25/2021) Left heart catheterization: Proximal RCA stent re-stenosis (2020) Risk Assessment/Calculations:            Physical Exam:   VS:  BP 110/86   Pulse 88   Ht 6\' 3"  (1.905 m)   Wt 229 lb (103.9 kg) Comment: Per pt "weighed this morning"  SpO2 96%   BMI 28.62 kg/m    Wt Readings from Last 3 Encounters:  11/04/23 229 lb (103.9 kg)  06/19/23 230 lb (104.3 kg)  06/06/23 226 lb 9.6 oz (102.8 kg)    GEN: Well nourished, well developed in no acute distress NECK: No JVD; No carotid bruits CARDIAC: RRR, 2/6 SM, no rubs, no gallops, pacer RESPIRATORY:  Clear to auscultation without rales, wheezing or rhonchi  ABDOMEN: Soft, non-tender, non-distended EXTREMITIES:  No edema; No deformity  ASSESSMENT AND PLAN: .    Assessment and Plan Assessment & Plan Moderate aortic stenosis Aortic stenosis with a mean gradient of 22 mmHg. Monitoring for progression to severe stenosis is ongoing. Valve replacement via catheterization is preferred over open surgery due to its less invasive nature, involving access through the leg using a larger catheter. - Order echocardiogram to assess the current status of the aortic valve.  Paroxysmal atrial fibrillation Paroxysmal atrial  fibrillation with episodes in 2018 and 2022. On Eliquis for anticoagulation with a CHADS-VASc score of 4, indicating moderate stroke risk. Eliquis dose is adjusted for renal function. - Continue Eliquis 2.5 mg twice a day for anticoagulation.  Coronary artery disease Coronary artery disease with PCI and DES to the RCA and LAD. Proximal RCA stent re-stenosis is managed medically. LDL is well-controlled at 58 mg/dL with atorvastatin. - Continue atorvastatin 40 mg daily for lipid management.  Sick sinus syndrome status post [employer] pacemaker Pacemaker is functioning well with no NSVT noted. Implanted in 2019 for sick sinus syndrome. - Continue regular pacemaker checks.  Chronic kidney disease, stage IV Stage IV chronic kidney disease with creatinine at 1.9 mg/dL. Eliquis dose adjusted accordingly. - Monitor renal function regularly.  Fatigue and shortness of breath Shortness of breath likely related to elevated diaphragm causing restrictive lung issues. Consideration of aortic valve progression was noted, but diaphragm issue is a contributing factor. - Encourage staying active and using the recumbent bicycle to improve physical conditioning.           Signed, Donato Schultz, MD

## 2023-11-19 NOTE — Addendum Note (Signed)
 Addended by: Geralyn Flash D on: 11/19/2023 09:59 AM   Modules accepted: Orders

## 2023-11-19 NOTE — Progress Notes (Signed)
 Remote pacemaker transmission.

## 2023-12-03 ENCOUNTER — Ambulatory Visit (HOSPITAL_COMMUNITY): Attending: Cardiovascular Disease

## 2023-12-03 DIAGNOSIS — I350 Nonrheumatic aortic (valve) stenosis: Secondary | ICD-10-CM | POA: Insufficient documentation

## 2023-12-03 LAB — ECHOCARDIOGRAM COMPLETE
AR max vel: 0.56 cm2
AV Area VTI: 0.63 cm2
AV Area mean vel: 0.53 cm2
AV Mean grad: 48 mmHg
AV Peak grad: 73.3 mmHg
Ao pk vel: 4.28 m/s
Area-P 1/2: 6.02 cm2
MV M vel: 4.41 m/s
MV Peak grad: 77.8 mmHg
P 1/2 time: 708 ms
Radius: 0.6 cm
S' Lateral: 2.8 cm

## 2023-12-04 ENCOUNTER — Encounter: Payer: Self-pay | Admitting: Cardiology

## 2023-12-04 DIAGNOSIS — I350 Nonrheumatic aortic (valve) stenosis: Secondary | ICD-10-CM

## 2023-12-14 NOTE — Progress Notes (Unsigned)
 Patient ID: [name] MRN: [id number] DOB/AGE: [birth year] 89 y.o.  Primary Care Physician:Hunter, Saverio Curling, MD Primary Cardiologist: Renna Cary  CC:  Aortic valvular disease management     FOCUSED PROBLEM LIST:   AS AVA 0.63, MG 48, V-max 4.3, EF 60-65% TTE April 2025 CAD S/p DES RCA and LAD 2018 Moderate ISR RCA DES angio 2020 SSS S/p MDT PPM PAF CV2 4 On Eliquis  HL Aortic atheroclerosis CXR 2023 HTN CKD IIIb BMI 28/BSA 2.14 Dec 2023:  Patient consents to use of AI scribe.*** The patient is a 89 year old male with above listed medical problems referred by Dr. Renna Cary for recommendations regarding the patient's severe aortic stenosis.  Last time he was seen he reported shortness of breath.  Echocardiogram demonstrated progression of moderate to severe aortic stenosis.         Past Medical History:  Diagnosis Date   Arthritis    "back, knees" (11/21/2016)   Bladder stones    CAD (coronary artery disease)    11/20/16 PCI with DES--> LAD/RCA   Chronic lower back pain    CKD (chronic kidney disease), stage IV (HCC)    Maximo Spar 11/08/2016   Dilatation of aorta (HCC)    Elevated hemidiaphragm    History of gout    History of kidney stones 12/02/2008   Hyperlipidemia    Hypertension    Myocardial infarction (HCC)    "silent" (11/21/2016)   NSVT (nonsustained ventricular tachycardia) (HCC)    Pacemaker    PAF (paroxysmal atrial fibrillation) (HCC)    PSA, INCREASED 12/05/2009   Pulmonary disease    Pulmonary emboli (HCC)    Pulmonary hypertension (HCC)    Recurrent deep venous thrombosis (HCC)    UTI (urinary tract infection)     Past Surgical History:  Procedure Laterality Date   CARDIAC CATHETERIZATION  11/14/2016   CATARACT EXTRACTION W/ INTRAOCULAR LENS  IMPLANT, BILATERAL     CORONARY ANGIOPLASTY WITH STENT PLACEMENT  11/21/2016   "2 stents"    CORONARY ATHERECTOMY N/A 11/21/2016   Procedure: Coronary Atherectomy;  Surgeon: Arleen Lacer, MD;   Location[facility] INVASIVE CV LAB;  Service: Cardiovascular;  Laterality: N/A;  RCA and LAD   CORONARY PRESSURE/FFR STUDY N/A 05/22/2019   Procedure: INTRAVASCULAR PRESSURE WIRE/FFR STUDY;  Surgeon: Sammy Crisp, MD;  Location: [facility] INVASIVE CV LAB;  Service: Cardiovascular;  Laterality: N/A;   CORONARY STENT INTERVENTION N/A 11/21/2016   Procedure: Coronary Stent Intervention;  Surgeon: Arleen Lacer, MD;  Location: [facility] INVASIVE CV LAB;  Service: Cardiovascular;  Laterality: N/A;  RCA and LAD   CYSTOSCOPY W/ STONE MANIPULATION     JOINT REPLACEMENT     KNEE ARTHROSCOPY Left    LAPAROSCOPIC CHOLECYSTECTOMY     LEFT HEART CATH N/A 11/21/2016   Procedure: Left Heart Cath;  Surgeon: Arleen Lacer, MD;  Location: [facility] INVASIVE CV LAB;  Service: Cardiovascular;  Laterality: N/A;   LITHOTRIPSY     PILONIDAL CYST EXCISION  58   RIGHT/LEFT HEART CATH AND CORONARY ANGIOGRAPHY N/A 11/14/2016   Procedure: Right/Left Heart Cath and Coronary Angiography;  Surgeon: Arleen Lacer, MD;  Location: [facility] INVASIVE CV LAB;  Service: Cardiovascular;  Laterality: N/A;   RIGHT/LEFT HEART CATH AND CORONARY ANGIOGRAPHY N/A 05/22/2019   Procedure: RIGHT/LEFT HEART CATH AND CORONARY ANGIOGRAPHY;  Surgeon: Sammy Crisp, MD;  Location: [facility] INVASIVE CV LAB;  Service: Cardiovascular;  Laterality: N/A;   TEMPORARY PACEMAKER N/A 11/21/2016   Procedure: Temporary Pacemaker;  Surgeon: Vashti Gentles  Addie Holstein, MD;  Location: [facility] INVASIVE CV LAB;  Service: Cardiovascular;  Laterality: N/A;   TONSILLECTOMY     TOTAL KNEE ARTHROPLASTY Left 09/22/2012   Procedure: TOTAL KNEE ARTHROPLASTY;  Surgeon: Genevie Kerns, MD;  Location: [facility] OR;  Service: Orthopedics;  Laterality: Left;    Family History  Problem Relation Age of Onset   Breast cancer Mother    Heart attack Father        38, smoker    Social History   Socioeconomic History   Marital status: Married    Spouse name: Not on file   Number of children: Not on file   Years of education:  Not on file   Highest education level: Not on file  Occupational History   Not on file  Tobacco Use   Smoking status: Never   Smokeless tobacco: Never  Vaping Use   Vaping status: Never Used  Substance and Sexual Activity   Alcohol use: Not Currently    Comment: 11/21/2016 "nothing since 2015"   Drug use: No   Sexual activity: Yes    Partners: Female  Other Topics Concern   Not on file  Social History Narrative   Married (Wife June patient of Dr. Arlene Ben). 2 sons. 4 grandkids.    Originally from [place], [place]    Lives at [employer] 6-7 months of the year      Retired from [employer] long time. 27 years of service.       Hobbies: golf   Used to [employer], run [employer]   Social Drivers of [employer]: Low Risk  (03/25/2023)   Overall Financial Resource Strain (CARDIA)    Difficulty of Paying Living Expenses: Not hard at all  Food Insecurity: No Food Insecurity (03/25/2023)   Hunger Vital Sign    Worried About Running Out of Food in the Last Year: Never true    Ran Out of Food in the Last Year: Never true  Transportation Needs: No Transportation Needs (03/25/2023)   PRAPARE - Administrator, Civil Service (Medical): No    Lack of Transportation (Non-Medical): No  Physical Activity: Sufficiently Active (03/25/2023)   Exercise Vital Sign    Days of Exercise per Week: 7 days    Minutes of Exercise per Session: 30 min  Stress: No Stress Concern Present (03/25/2023)   [employer] of Occupational Health - Occupational Stress Questionnaire    Feeling of Stress : Not at all  Social Connections: Socially Integrated (03/25/2023)   Social Connection and Isolation Panel [NHANES]    Frequency of Communication with Friends and Family: More than three times a week    Frequency of Social Gatherings with Friends and Family: More than three times a week    Attends Religious Services: More than 4 times per year    Active Member of [employer]  or Organizations: Yes    Attends Banker Meetings: 1 to 4 times per year    Marital Status: Married  Catering manager Violence: Not At Risk (05/26/2023)   Received from [facility]   HITS    Over the last 12 months how often did your partner physically hurt you?: Never    Over the last 12 months how often did your partner insult you or talk down to you?: Never    Over the last 12 months how often did your partner threaten you with physical harm?: Never    Over the last 12 months how  often did your partner scream or curse at you?: Never     Prior to Admission medications   Medication Sig Start Date End Date Taking? Authorizing Provider  allopurinol  (ZYLOPRIM ) 300 MG tablet TAKE 1 TABLET DAILY 10/21/23   Almira Jaeger, MD  apixaban  (ELIQUIS ) 2.5 MG TABS tablet TAKE 1 TABLET TWICE A DAY 09/05/23   Hugh Madura, MD  atorvastatin  (LIPITOR) 40 MG tablet TAKE 1 TABLET DAILY 02/21/23   Almira Jaeger, MD  diphenhydramine -acetaminophen  (TYLENOL  PM) 25-500 MG TABS tablet Take 1 tablet by mouth at bedtime as needed (sleep).    [provider]  finasteride (PROSCAR) 5 MG tablet Take 5 mg by mouth daily. 11/11/20   [provider]  furosemide  (LASIX ) 20 MG tablet TAKE 1 TABLET TWICE A DAY 09/05/23   Hugh Madura, MD  metoprolol  succinate (TOPROL -XL) 50 MG 24 hr tablet Take 1 tablet (50 mg total) by mouth daily. Take with or immediately following a meal. 10/23/23   Hugh Madura, MD  nitroGLYCERIN  (NITROSTAT ) 0.4 MG SL tablet Place 1 tablet (0.4 mg total) under the tongue every 5 (five) minutes as needed for chest pain. 12/12/20   Hugh Madura, MD  Tamsulosin  HCl (FLOMAX ) 0.4 MG CAPS Take 0.4 mg by mouth at bedtime.     [provider]    Allergies  Allergen Reactions   Epinephrine  Palpitations and Other (See Comments)    "Heart rate races"   Penicillins Hives    Has patient had a PCN reaction causing immediate rash, facial/tongue/throat swelling, SOB or  lightheadedness with hypotension: No Has patient had a PCN reaction causing severe rash involving mucus membranes or skin necrosis: no Has patient had a PCN reaction that required hospitalization: No Has patient had a PCN reaction occurring within the last 10 years: No If all of the above answers are "NO", then may proceed with Cephalosporin use.    Lisinopril Cough   Novocain [Procaine] Palpitations and Other (See Comments)    Makes the heart rate    REVIEW OF SYSTEMS:  General: no fevers/chills/night sweats Eyes: no blurry vision, diplopia, or amaurosis ENT: no sore throat or hearing loss Resp: no cough, wheezing, or hemoptysis CV: no edema or palpitations GI: no abdominal pain, nausea, vomiting, diarrhea, or constipation GU: no dysuria, frequency, or hematuria Skin: no rash Neuro: no headache, numbness, tingling, or weakness of extremities Musculoskeletal: no joint pain or swelling Heme: no bleeding, DVT, or easy bruising Endo: no polydipsia or polyuria  There were no vitals taken for this visit.  PHYSICAL EXAM: GEN:  AO x 3 in no acute distress HEENT: normal Dentition: Normal*** Neck: JVP normal. +2***carotid upstrokes without bruits. No thyromegaly. Lungs: equal expansion, clear bilaterally CV: Apex is discrete and nondisplaced, RRR without murmur or gallop*** Abd: soft, non-tender, non-distended; no bruit; positive bowel sounds Ext: no edema, ecchymoses, or cyanosis Vascular: 2+ femoral pulses, 2+ radial pulses       Skin: warm and dry without rash Neuro: CN II-XII grossly intact; motor and sensory grossly intact    DATA AND STUDIES:  EKG: 2024 EKG AV pacing  EKG Interpretation Date/Time:    Ventricular Rate:    PR Interval:    QRS Duration:    QT Interval:    QTC Calculation:   R Axis:      Text Interpretation:          Cardiac Studies & Procedures    ______________________________________________________________________________________________ CARDIAC CATHETERIZATION  CARDIAC CATHETERIZATION 05/22/2019  Conclusion Conclusions: 1. Significant single-vessel coronary artery disease with 60% in-stent restenosis of proximal RCA stent that is mildly abnormal by hemodynamic assessment (DFR 0.89; abnormal if less than 0.90). 2. Mild to moderate, non-obstructive coronary artery disease involving the LAD and LCx; LAD stent is widely patent. 3. Mildly elevated left heart, right heart, and pulmonary artery pressures. 4. Mild to moderate aortic valve stenosis. 5. Mildly reduced Fick cardiac output/index.  Recommendations: 1. Optimize medical therapy, including aggressive antianginal therapy.  We will add isosorbide  mononitrate 30 mg daily, to be escalated as tolerated. 2. If symptoms persist/worsen despite maximal tolerated doses of at least two antianginal agents, PCI to proximal RCA in-stent restenosis will need to be considered. 3. Aggressive secondary prevention. 4. Restart apixaban  tomorrow morning if no evidence of bleeding or vascular injury at catheterization sites.  Sammy Crisp, MD [facility] Pager: [phone]  Findings Coronary Findings Diagnostic  Dominance: Right  Left Main Vessel is large. The vessel is moderately calcified.  Left Anterior Descending The vessel is moderately calcified. Proximal calcification Ost LAD lesion is 5% stenosed. The lesion is tubular. The lesion is moderately calcified. Previously placed Prox LAD drug eluting stent is widely patent. The lesion is concentric and irregular. The lesion is moderately calcified.  First Diagonal Branch Vessel is moderate in size. Ost 1st Diag to 1st Diag lesion is 50% stenosed. The lesion is eccentric. 1st Diag lesion is 40% stenosed.  Lateral First Diagonal Branch Vessel is small in size.  First Septal Branch Vessel is small in size.  Second  Diagonal Branch Vessel is small in size.  Second Septal Branch Vessel is small in size.  Third Diagonal Branch Vessel is small in size.  Third Septal Branch Vessel is small in size.  Ramus Intermedius The vessel exhibits minimal luminal irregularities.  Left Circumflex Prox Cx to Mid Cx lesion is 20% stenosed. The lesion is moderately calcified.  First Obtuse Marginal Branch Vessel is small in size.  Lateral Second Obtuse Marginal Branch Vessel is small in size.  Third Obtuse Marginal Branch Vessel is small in size.  Right Coronary Artery Vessel is large. The vessel exhibits minimal luminal irregularities. The vessel is severely calcified. Prox RCA lesion is 60% stenosed. Vessel is the culprit lesion. The lesion is located at the major branch, eccentric and irregular. The lesion is severely calcified. The lesion was previously treated using a drug eluting stent over 2 years ago. Previously placed stent displays restenosis. Pressure wire/FFR was performed on the lesion. DFR 0.89.  Acute Marginal Branch Vessel is small in size.  Inferior Septal Vessel is small in size.  Right Posterior Atrioventricular Artery RPAV lesion is 50% stenosed.  First Right Posterolateral Branch Vessel is moderate in size.  Second Right Posterolateral Branch Vessel is moderate in size.  Intervention  No interventions have been documented.   CARDIAC CATHETERIZATION  CARDIAC CATHETERIZATION 11/21/2016  Conclusion Images from the original result were not included.   LESION #1  Prox RCA lesion, 80 %stenosed.  Ost 1st Diag to 1st Diag lesion, 50 %stenosed. Stable.  Post intervention, there is a 0% residual stenosis.  After ORBITAL ATHERECTOMY, A STENT RESOLUTE ONYX 3.5X22 drug eluting stent was successfully placed.  Lesion #2  Prox LAD lesion, 70 %stenosed - calcified, crossed 1st Diag.  After ORBITAL ATHERECTOMY, A STENT RESOLUTE EXBM8.4X32 drug eluting stent was  successfully placed.  Post intervention, there is a 0% residual stenosis.  __________________  LV end diastolic pressure is normal.  Successful 2 vessel  oral atherectomy based DES PCI on RCA and LAD.   PLAN:  Overnight observation for IV hydration - continue 125 ML per hour for 10 hours, then reduce to 75 ML per hour overnight.  Continue dual antiplatelet therapy for minimum one year (could stop aspirin  after 2-3 months if necessary)  Closely monitor tomorrow or determine stability for discharge, if renal function is stable, he should be fine for discharge tomorrow with plan to check an outpatient chemical panel early next week.   He will follow-up with Dr. Leonel Ram, M.D., M.S. Interventional Cardiologist  Pager # [phone] Phone # [phone] [address] [place], [place] [address]  Findings Coronary Findings Diagnostic  Dominance: Right  Left Main Vessel is large. The vessel is moderately calcified.  Left Anterior Descending The vessel is moderately calcified. Proximal calcification The lesion is tubular. The lesion is moderately calcified. The lesion is concentric and irregular. The lesion is moderately calcified.  First Diagonal Branch Vessel is moderate in size. The lesion is eccentric.  Lateral First Diagonal Branch Vessel is small in size.  First Septal Branch Vessel is small in size.  Second Diagonal Branch Vessel is small in size.  Second Septal Branch Vessel is small in size.  Third Diagonal Branch Vessel is small in size.  Third Septal Branch Vessel is small in size.  Ramus Intermedius Vessel is moderate in size.  Left Circumflex The lesion is moderately calcified.  First Obtuse Marginal Branch Vessel is small in size.  Lateral Second Obtuse Marginal Branch Vessel is small in size.  Third Obtuse Marginal Branch Vessel is small in size.  Right Coronary Artery Vessel is large. The vessel  exhibits minimal luminal irregularities. The vessel is severely calcified. Culprit lesion. The lesion is located at the major branch, eccentric and irregular. The lesion is severely calcified.  Acute Marginal Branch Vessel is small in size.  Inferior Septal Vessel is small in size.  First Right Posterolateral Branch Vessel is moderate in size.  Second Right Posterolateral Branch Vessel is moderate in size.  Intervention  Prox LAD lesion Atherectomy Orbital atherectomy was performed using a CROWN DIAMONDBACK CLASSIC 1.25. The intervention was successful. Angioplasty Lesion length: 36 mm. Lesion crossed with guidewire using a WIRE ASAHI PROWATER 300CM. Pre-stent angioplasty was performed using a BALLOON MAVERICK OTW 3.0X15. Maximum pressure: 12 atm. Inflation time: 20 sec. A STENT RESOLUTE [id number] drug eluting stent was successfully placed. Minimum lumen area: 3.6 mm. Stent strut is well apposed. Post-stent angioplasty was performed using a BALLOON [place] EMERGE MR 3.5X20. Maximum pressure: 16 atm. Inflation time: 20 sec. The pre-interventional distal flow is normal (TIMI 3).  The post-interventional distal flow is normal (TIMI 3). The intervention was successful . No complications occurred at this lesion. CATHETER LAUNCHER 6FREBU 3.75 (Yes) - WIRE VIPER ADVANCE COR .012TIP (Yes) used for Atherectomy, exchanged over OTW Balloon for PCI. There is a 0% residual stenosis post intervention.  Prox RCA lesion Atherectomy Orbital atherectomy was performed using a CROWN DIAMONDBACK CLASSIC 1.25. Atherectomy device crossed the lesion. The intervention was successful. Angioplasty Lesion length: 20 mm. Lesion crossed with guidewire using a WIRE ASAHI PROWATER 300CM. Pre-stent angioplasty was performed using a BALLOON MAVERICK OTW 3.0X15. Maximum pressure: 12 atm. Inflation time: 20 sec. A STENT RESOLUTE ONYX 3.5X22 drug eluting stent was successfully placed. Minimum lumen area: 4.1 mm. Stent strut is  well apposed. Post-stent angioplasty was performed using a BALLOON [place] EUPHORA RX4.0X15. Maximum pressure: 18 atm. Inflation time:  20 sec. The pre-interventional distal flow is normal (TIMI 3).  The post-interventional distal flow is normal (TIMI 3). The intervention was successful . No complications occurred at this lesion. 6Fr AL 0.75 Guide Catheter -WIRE ASAHI PROWATER 300CM (Yes), exchanged over OTW balloon for WIRE HI TORQ VERSACORE-J 145CM &amp; for Atherectomy &amp; then back for PTCA &amp; PCI. There is a 0% residual stenosis post intervention.   STRESS TESTS  MYOCARDIAL PERFUSION IMAGING 11/01/2016  Narrative  Nuclear stress EF: 44%.  There was no ST segment deviation noted during stress.  The left ventricular ejection fraction is moderately decreased (30-44%).  This is an intermediate risk study.  Findings consistent with prior myocardial infarction.  Small mid and basal inferior wall infarct Small apical infarct No ischemia EF 44% diffuse hypokinesis   ECHOCARDIOGRAM  ECHOCARDIOGRAM COMPLETE 12/03/2023  Narrative ECHOCARDIOGRAM REPORT    Patient Name:   [name] Date of Exam: 12/03/2023 Medical Rec #:  [id number]      Height:       75.0 in Accession #:    [id number]     Weight:       229.0 lb Date of Birth:  Jul 13, 1934      BSA:          2.324 m Patient Age:    89 years       BP:           120/74 mmHg Patient Gender: M              HR:           59 bpm. Exam Location:  [address]  Procedure: 2D Echo, Cardiac Doppler and Color Doppler (Both Spectral and Color Flow Doppler were utilized during procedure).  Indications:    I35.0 Aortic Stenosis  History:        Patient has prior history of Echocardiogram examinations, most recent 10/25/2021. CAD, Pacemaker, CKD, Arrythmias:Atrial Fibrillation; Signs/Symptoms:Shortness of Breath.  Sonographer:    Juventino Oppenheim RCS Referring Phys: [id number] MARK C SKAINS  IMPRESSIONS   1. Left ventricular ejection  fraction, by estimation, is 60 to 65%. The left ventricle has normal function. The left ventricle has no regional wall motion abnormalities. There is mild concentric left ventricular hypertrophy. Left ventricular diastolic parameters are indeterminate. 2. Right ventricular systolic function is normal. The right ventricular size is normal. There is normal pulmonary artery systolic pressure. The estimated right ventricular systolic pressure is 35.0 mmHg. 3. Left atrial size was moderately dilated. 4. The mitral valve is degenerative. Mild to moderate mitral valve regurgitation. No evidence of mitral stenosis. Moderate mitral annular calcification. 5. Tricuspid valve regurgitation is moderate. 6. The aortic valve has an indeterminant number of cusps. There is severe calcifcation of the aortic valve. There is moderate thickening of the aortic valve. Aortic valve regurgitation is mild. Severe aortic valve stenosis. Aortic valve mean gradient measures 48.0 mmHg. Aortic valve Vmax measures 4.28 m/s. Aortic valve acceleration time measures 116 msec. 7. The inferior vena cava is normal in size with greater than 50% respiratory variability, suggesting right atrial pressure of 3 mmHg.  Comparison(s): Prior images reviewed side by side. Aortic stenosis has worsened and is now severe.  FINDINGS Left Ventricle: Left ventricular ejection fraction, by estimation, is 60 to 65%. The left ventricle has normal function. The left ventricle has no regional wall motion abnormalities. The left ventricular internal cavity size was normal in size. There is mild concentric left ventricular hypertrophy. Left ventricular diastolic parameters are indeterminate.  Normal left ventricular filling pressure.  Right Ventricle: The right ventricular size is normal. No increase in right ventricular wall thickness. Right ventricular systolic function is normal. There is normal pulmonary artery systolic pressure. The tricuspid regurgitant  velocity is 2.83 m/s, and with an assumed right atrial pressure of 3 mmHg, the estimated right ventricular systolic pressure is 35.0 mmHg.  Left Atrium: Left atrial size was moderately dilated.  Right Atrium: Right atrial size was normal in size.  Pericardium: There is no evidence of pericardial effusion.  Mitral Valve: The mitral valve is degenerative in appearance. Moderate mitral annular calcification. Mild to moderate mitral valve regurgitation, with centrally-directed jet. No evidence of mitral valve stenosis.  Tricuspid Valve: The tricuspid valve is normal in structure. Tricuspid valve regurgitation is moderate.  Aortic Valve: The aortic valve has an indeterminant number of cusps. There is severe calcifcation of the aortic valve. There is moderate thickening of the aortic valve. Aortic valve regurgitation is mild. Aortic regurgitation PHT measures 708 msec. Severe aortic stenosis is present. Aortic valve mean gradient measures 48.0 mmHg. Aortic valve peak gradient measures 73.3 mmHg. Aortic valve area, by VTI measures 0.63 cm.  Pulmonic Valve: The pulmonic valve was grossly normal. Pulmonic valve regurgitation is trivial. No evidence of pulmonic stenosis.  Aorta: The aortic root and ascending aorta are structurally normal, with no evidence of dilitation.  Venous: The inferior vena cava is normal in size with greater than 50% respiratory variability, suggesting right atrial pressure of 3 mmHg.  IAS/Shunts: No atrial level shunt detected by color flow Doppler.  Additional Comments: A device lead is visualized in the right ventricle and right atrium.   LEFT VENTRICLE PLAX 2D LVIDd:         4.90 cm   Diastology LVIDs:         2.80 cm   LV e' medial:    7.29 cm/s LV PW:         1.30 cm   LV E/e' medial:  10.5 LV IVS:        1.40 cm   LV e' lateral:   10.10 cm/s LVOT diam:     2.00 cm   LV E/e' lateral: 7.6 LV SV:         68 LV SV Index:   29 LVOT Area:     3.14 cm   RIGHT  VENTRICLE RV Basal diam:  3.70 cm RV S prime:     9.46 cm/s TAPSE (M-mode): 2.1 cm RVSP:           35.0 mmHg  LEFT ATRIUM             Index        RIGHT ATRIUM           Index LA diam:        5.10 cm 2.19 cm/m   RA Pressure: 3.00 mmHg LA Vol (A2C):   88.3 ml 37.99 ml/m  RA Area:     17.80 cm LA Vol (A4C):   77.9 ml 33.51 ml/m  RA Volume:   46.30 ml  19.92 ml/m LA Biplane Vol: 86.9 ml 37.39 ml/m AORTIC VALVE AV Area (Vmax):    0.56 cm AV Area (Vmean):   0.53 cm AV Area (VTI):     0.63 cm AV Vmax:           428.00 cm/s AV Vmean:          333.000 cm/s AV VTI:  1.080 m AV Peak Grad:      73.3 mmHg AV Mean Grad:      48.0 mmHg LVOT Vmax:         76.07 cm/s LVOT Vmean:        56.433 cm/s LVOT VTI:          0.217 m LVOT/AV VTI ratio: 0.20 AI PHT:            708 msec  AORTA Ao Root diam: 3.50 cm Ao Asc diam:  3.50 cm  MITRAL VALVE                  TRICUSPID VALVE MV Area (PHT):                TR Peak grad:   32.0 mmHg MV Decel Time:                TR Vmax:        283.00 cm/s MR Peak grad:    77.8 mmHg    Estimated RAP:  3.00 mmHg MR Mean grad:    52.0 mmHg    RVSP:           35.0 mmHg MR Vmax:         441.00 cm/s MR Vmean:        325.0 cm/s   SHUNTS MR PISA:         2.26 cm     Systemic VTI:  0.22 m MR PISA Eff ROA: 15 mm       Systemic Diam: 2.00 cm MR PISA Radius:  0.60 cm MV E velocity: 76.50 cm/s MV A velocity: 87.00 cm/s MV E/A ratio:  0.88  Mihai Croitoru MD Electronically signed by Luana Rumple MD Signature Date/Time: 12/03/2023/2:43:15 PM    Final          ______________________________________________________________________________________________      05/22/2023: ALT 24; BUN 32; Creatinine, Ser 1.98; Hemoglobin 13.4; Magnesium 2.1; Platelets 180; Potassium 4.3; Sodium 143; TSH 1.460   STS RISK CALCULATOR: Pending  NHYA CLASS: ***    ASSESSMENT AND PLAN:   1. Aortic valve stenosis, etiology of cardiac valve disease  unspecified   2. CAD in native artery   3. Cardiac pacemaker in situ   4. Paroxysmal atrial fibrillation (HCC)   5. Secondary hypercoagulable state (HCC)   6. Hyperlipidemia LDL goal <70   7. Aortic atherosclerosis (HCC)   8. Primary hypertension   9. Stage 3b chronic kidney disease (HCC)     Aortic stenosis:*** CAD: Status post PCI of the right coronary artery and LAD.  Continue Eliquis  2.5 mg twice daily, atorvastatin  40 mg daily, as needed nitroglycerin , Toprol  XL 50 mg daily. Status post permanent pacemaker: Given indwelling pacemaker and elevated BSA if TAVR is pursued then a Medtronic valve should be strongly considered.  Given his history of PCI a Medtronic FX plus valve would be desirable. PAF: Continue Eliquis  2.5 mg twice daily, Toprol -XL 50 mg daily. Secondary hypercoagulable state: Continue Eliquis  2.5 mg twice daily. Hyperlipidemia: Continue atorvastatin  40 mg daily; this is being followed by the patient's primary cardiologist. Aortic atherosclerosis: Continue Eliquis  2.5 mg twice daily and atorvastatin  1 mg daily. Hypertension: Continue Toprol  50 mg daily; would refrain from aggressive blood pressure control at this time; can be pursued after aortic stenosis has been managed. Stage IIIb CKD: May benefit from ARB and SGLT2 inhibitor in the future.   I have personally reviewed the patients imaging data as summarized above.  I  have reviewed the natural history of aortic stenosis with the patient and family members who are present today. We have discussed the limitations of medical therapy and the poor prognosis associated with symptomatic aortic stenosis. We have also reviewed potential treatment options, including palliative medical therapy, conventional surgical aortic valve replacement, and transcatheter aortic valve replacement. We discussed treatment options in the context of this patient's specific comorbid medical conditions.   All of the patient's questions were answered  today. Will make further recommendations based on the results of studies outlined above.   I spent *** minutes reviewing all clinical data during and prior to this visit including all relevant imaging studies, laboratories, clinical information from other health systems and prior notes from both Cardiology and other specialties, interviewing the patient, conducting a complete physical examination, and coordinating care in order to formulate a comprehensive and personalized evaluation and treatment plan.   Arrin Ishler K Lailyn Appelbaum, MD  12/14/2023 6:49 PM    [facility] [address] [place], [place], [place]  [address] Phone: [phone]; Fax: [phone]

## 2023-12-14 NOTE — H&P (View-Only) (Signed)
 Patient ID: [name] MRN: [id number] DOB/AGE: October 30, 1933 89 y.o.  Primary Care Physician:Hunter, Saverio Curling, MD Primary Cardiologist: Renna Cary  CC:  Aortic valvular disease management     FOCUSED PROBLEM LIST:   AS AVA 0.63, MG 48, V-max 4.3, EF 60-65% TTE April 2025 CAD S/p DES RCA and LAD 2018 Moderate ISR RCA DES angio 2020 SSS S/p MDT PPM PAF CV2 4 On Eliquis  HL Aortic atheroclerosis CXR 2023 HTN CKD IIIb BMI 28/BSA 2.14 Dec 2023:  Patient consents to use of AI scribe. The patient is a 89 year old male with above listed medical problems referred by Dr. Renna Cary for recommendations regarding the patient's severe aortic stenosis.  Last time he was seen he reported shortness of breath.  An echocardiogram demonstrated progression of aortic valvular disease to severe aortic stenosis.  He has experienced worsening shortness of breath over the past year, which has been ongoing for approximately two years. Activities such as climbing stairs and walking a block require him to stop and rest due to breathlessness.  The symptoms of gotten worse over the last year or so.  No chest discomfort, lightheadedness, or blacking out spells. He is not short of breath while sitting or lying flat.  No issues with chest pain and no significant bleeding while on Eliquis , although minor skin tears and bruising easily.  His shortness of breath has impacted his ability to engage in activities he enjoys, such as playing golf and working in the yard. Two years ago, he was able to perform these activities without difficulty.  He would like to be able to get back to golfing.  He denies any dental complaints.      Past Medical History:  Diagnosis Date   Arthritis    "back, knees" (11/21/2016)   Bladder stones    CAD (coronary artery disease)    11/20/16 PCI with DES--> LAD/RCA   Chronic lower back pain    CKD (chronic kidney disease), stage IV (HCC)    Maximo Spar 11/08/2016   Dilatation of aorta  (HCC)    Elevated hemidiaphragm    History of gout    History of kidney stones 12/02/2008   Hyperlipidemia    Hypertension    Myocardial infarction (HCC)    "silent" (11/21/2016)   NSVT (nonsustained ventricular tachycardia) (HCC)    Pacemaker    PAF (paroxysmal atrial fibrillation) (HCC)    PSA, INCREASED 12/05/2009   Pulmonary disease    Pulmonary emboli (HCC)    Pulmonary hypertension (HCC)    Recurrent deep venous thrombosis (HCC)    UTI (urinary tract infection)     Past Surgical History:  Procedure Laterality Date   CARDIAC CATHETERIZATION  11/14/2016   CATARACT EXTRACTION W/ INTRAOCULAR LENS  IMPLANT, BILATERAL     CORONARY ANGIOPLASTY WITH STENT PLACEMENT  11/21/2016   "2 stents"    CORONARY ATHERECTOMY N/A 11/21/2016   Procedure: Coronary Atherectomy;  Surgeon: Arleen Lacer, MD;  Location: [facility] INVASIVE CV LAB;  Service: Cardiovascular;  Laterality: N/A;  RCA and LAD   CORONARY PRESSURE/FFR STUDY N/A 05/22/2019   Procedure: INTRAVASCULAR PRESSURE WIRE/FFR STUDY;  Surgeon: Sammy Crisp, MD;  Location: [facility] INVASIVE CV LAB;  Service: Cardiovascular;  Laterality: N/A;   CORONARY STENT INTERVENTION N/A 11/21/2016   Procedure: Coronary Stent Intervention;  Surgeon: Arleen Lacer, MD;  Location: [facility] INVASIVE CV LAB;  Service: Cardiovascular;  Laterality: N/A;  RCA and LAD   CYSTOSCOPY W/ STONE MANIPULATION     JOINT REPLACEMENT  KNEE ARTHROSCOPY Left    LAPAROSCOPIC CHOLECYSTECTOMY     LEFT HEART CATH N/A 11/21/2016   Procedure: Left Heart Cath;  Surgeon: Arleen Lacer, MD;  Location: [facility] INVASIVE CV LAB;  Service: Cardiovascular;  Laterality: N/A;   LITHOTRIPSY     PILONIDAL CYST EXCISION  58   RIGHT/LEFT HEART CATH AND CORONARY ANGIOGRAPHY N/A 11/14/2016   Procedure: Right/Left Heart Cath and Coronary Angiography;  Surgeon: Arleen Lacer, MD;  Location: [facility] INVASIVE CV LAB;  Service: Cardiovascular;  Laterality: N/A;   RIGHT/LEFT HEART CATH AND CORONARY ANGIOGRAPHY N/A  05/22/2019   Procedure: RIGHT/LEFT HEART CATH AND CORONARY ANGIOGRAPHY;  Surgeon: Sammy Crisp, MD;  Location: [facility] INVASIVE CV LAB;  Service: Cardiovascular;  Laterality: N/A;   TEMPORARY PACEMAKER N/A 11/21/2016   Procedure: Temporary Pacemaker;  Surgeon: Arleen Lacer, MD;  Location: [facility] INVASIVE CV LAB;  Service: Cardiovascular;  Laterality: N/A;   TONSILLECTOMY     TOTAL KNEE ARTHROPLASTY Left 09/22/2012   Procedure: TOTAL KNEE ARTHROPLASTY;  Surgeon: Genevie Kerns, MD;  Location: [facility] OR;  Service: Orthopedics;  Laterality: Left;    Family History  Problem Relation Age of Onset   Breast cancer Mother    Heart attack Father        10, smoker    Social History   Socioeconomic History   Marital status: Married    Spouse name: Not on file   Number of children: Not on file   Years of education: Not on file   Highest education level: Not on file  Occupational History   Not on file  Tobacco Use   Smoking status: Never   Smokeless tobacco: Never  Vaping Use   Vaping status: Never Used  Substance and Sexual Activity   Alcohol use: Not Currently    Comment: 11/21/2016 "nothing since 2015"   Drug use: No   Sexual activity: Yes    Partners: Female  Other Topics Concern   Not on file  Social History Narrative   Married (Wife June patient of Dr. Arlene Ben). 2 sons. 4 grandkids.    Originally from [place]    Lives at [employer] 6-7 months of the year      Retired from [employer] long time. 27 years of service.       Hobbies: golf   Used to [employer], run [employer]   Social Drivers of [employer]: Low Risk  (03/25/2023)   Overall Financial Resource Strain (CARDIA)    Difficulty of Paying Living Expenses: Not hard at all  Food Insecurity: No Food Insecurity (03/25/2023)   Hunger Vital Sign    Worried About Running Out of Food in the Last Year: Never true    Ran Out of Food in the Last Year: Never true  Transportation Needs: No  Transportation Needs (03/25/2023)   PRAPARE - Administrator, Civil Service (Medical): No    Lack of Transportation (Non-Medical): No  Physical Activity: Sufficiently Active (03/25/2023)   Exercise Vital Sign    Days of Exercise per Week: 7 days    Minutes of Exercise per Session: 30 min  Stress: No Stress Concern Present (03/25/2023)   [employer] of Occupational Health - Occupational Stress Questionnaire    Feeling of Stress : Not at all  Social Connections: Socially Integrated (03/25/2023)   Social Connection and Isolation Panel [NHANES]    Frequency of Communication with Friends and Family: More than three times a week  Frequency of Social Gatherings with Friends and Family: More than three times a week    Attends Religious Services: More than 4 times per year    Active Member of [employer] or Organizations: Yes    Attends Banker Meetings: 1 to 4 times per year    Marital Status: Married  Catering manager Violence: Not At Risk (05/26/2023)   Received from [facility]   HITS    Over the last 12 months how often did your partner physically hurt you?: Never    Over the last 12 months how often did your partner insult you or talk down to you?: Never    Over the last 12 months how often did your partner threaten you with physical harm?: Never    Over the last 12 months how often did your partner scream or curse at you?: Never     Prior to Admission medications   Medication Sig Start Date End Date Taking? Authorizing Provider  allopurinol  (ZYLOPRIM ) 300 MG tablet TAKE 1 TABLET DAILY 10/21/23   Almira Jaeger, MD  apixaban  (ELIQUIS ) 2.5 MG TABS tablet TAKE 1 TABLET TWICE A DAY 09/05/23   Hugh Madura, MD  atorvastatin  (LIPITOR) 40 MG tablet TAKE 1 TABLET DAILY 02/21/23   Almira Jaeger, MD  diphenhydramine -acetaminophen  (TYLENOL  PM) 25-500 MG TABS tablet Take 1 tablet by mouth at bedtime as needed (sleep).    [provider]  finasteride  (PROSCAR) 5 MG tablet Take 5 mg by mouth daily. 11/11/20   [provider]  furosemide  (LASIX ) 20 MG tablet TAKE 1 TABLET TWICE A DAY 09/05/23   Hugh Madura, MD  metoprolol  succinate (TOPROL -XL) 50 MG 24 hr tablet Take 1 tablet (50 mg total) by mouth daily. Take with or immediately following a meal. 10/23/23   Hugh Madura, MD  nitroGLYCERIN  (NITROSTAT ) 0.4 MG SL tablet Place 1 tablet (0.4 mg total) under the tongue every 5 (five) minutes as needed for chest pain. 12/12/20   Hugh Madura, MD  Tamsulosin  HCl (FLOMAX ) 0.4 MG CAPS Take 0.4 mg by mouth at bedtime.     [provider]    Allergies  Allergen Reactions   Epinephrine  Palpitations and Other (See Comments)    "Heart rate races"   Penicillins Hives    Has patient had a PCN reaction causing immediate rash, facial/tongue/throat swelling, SOB or lightheadedness with hypotension: No Has patient had a PCN reaction causing severe rash involving mucus membranes or skin necrosis: no Has patient had a PCN reaction that required hospitalization: No Has patient had a PCN reaction occurring within the last 10 years: No If all of the above answers are "NO", then may proceed with Cephalosporin use.    Lisinopril Cough   Novocain [Procaine] Palpitations and Other (See Comments)    Makes the heart rate    REVIEW OF SYSTEMS:  General: no fevers/chills/night sweats Eyes: no blurry vision, diplopia, or amaurosis ENT: no sore throat or hearing loss Resp: no cough, wheezing, or hemoptysis CV: no edema or palpitations GI: no abdominal pain, nausea, vomiting, diarrhea, or constipation GU: no dysuria, frequency, or hematuria Skin: no rash Neuro: no headache, numbness, tingling, or weakness of extremities Musculoskeletal: no joint pain or swelling Heme: no bleeding, DVT, or easy bruising Endo: no polydipsia or polyuria  BP 116/82   Pulse 87   Ht 6\' 3"  (1.905 m)   Wt 228 lb (103.4 kg)   SpO2 98%   BMI 28.50 kg/m  PHYSICAL  EXAM: GEN:  AO x 3 in no acute distress HEENT: normal Dentition: Normal Neck: JVP normal. +2 carotid upstrokes without bruits. No thyromegaly. Lungs: equal expansion, clear bilaterally CV: Apex is discrete and nondisplaced, RRR with 3/6 SEM LUSB Abd: soft, non-tender, non-distended; no bruit; positive bowel sounds Ext: no edema, ecchymoses, or cyanosis Vascular: 2+ femoral pulses, 2+ radial pulses       Skin: warm and dry without rash Neuro: CN II-XII grossly intact; motor and sensory grossly intact    DATA AND STUDIES:  EKG: 2024 EKG AV pacing  EKG Interpretation Date/Time:  Monday Dec 16 2023 16:30:19 EDT Ventricular Rate:  62 PR Interval:  178 QRS Duration:  200 QT Interval:  494 QTC Calculation: 501 R Axis:   -79  Text Interpretation: AV dual-paced rhythm with occasional ventricular-paced complexes When compared with ECG of 19-Jun-2023 08:58, Vent. rate has decreased BY   8 BPM Confirmed by Anndrea Mihelich ([id number]) on 12/16/2023 7:30:11 PM        Cardiac Studies & Procedures   ______________________________________________________________________________________________ CARDIAC CATHETERIZATION  CARDIAC CATHETERIZATION 05/22/2019  Conclusion Conclusions: 1. Significant single-vessel coronary artery disease with 60% in-stent restenosis of proximal RCA stent that is mildly abnormal by hemodynamic assessment (DFR 0.89; abnormal if less than 0.90). 2. Mild to moderate, non-obstructive coronary artery disease involving the LAD and LCx; LAD stent is widely patent. 3. Mildly elevated left heart, right heart, and pulmonary artery pressures. 4. Mild to moderate aortic valve stenosis. 5. Mildly reduced Fick cardiac output/index.  Recommendations: 1. Optimize medical therapy, including aggressive antianginal therapy.  We will add isosorbide  mononitrate 30 mg daily, to be escalated as tolerated. 2. If symptoms persist/worsen despite maximal tolerated doses of at least two antianginal  agents, PCI to proximal RCA in-stent restenosis will need to be considered. 3. Aggressive secondary prevention. 4. Restart apixaban  tomorrow morning if no evidence of bleeding or vascular injury at catheterization sites.  Sammy Crisp, MD [facility] Pager: [phone]  Findings Coronary Findings Diagnostic  Dominance: Right  Left Main Vessel is large. The vessel is moderately calcified.  Left Anterior Descending The vessel is moderately calcified. Proximal calcification Ost LAD lesion is 5% stenosed. The lesion is tubular. The lesion is moderately calcified. Previously placed Prox LAD drug eluting stent is widely patent. The lesion is concentric and irregular. The lesion is moderately calcified.  First Diagonal Branch Vessel is moderate in size. Ost 1st Diag to 1st Diag lesion is 50% stenosed. The lesion is eccentric. 1st Diag lesion is 40% stenosed.  Lateral First Diagonal Branch Vessel is small in size.  First Septal Branch Vessel is small in size.  Second Diagonal Branch Vessel is small in size.  Second Septal Branch Vessel is small in size.  Third Diagonal Branch Vessel is small in size.  Third Septal Branch Vessel is small in size.  Ramus Intermedius The vessel exhibits minimal luminal irregularities.  Left Circumflex Prox Cx to Mid Cx lesion is 20% stenosed. The lesion is moderately calcified.  First Obtuse Marginal Branch Vessel is small in size.  Lateral Second Obtuse Marginal Branch Vessel is small in size.  Third Obtuse Marginal Branch Vessel is small in size.  Right Coronary Artery Vessel is large. The vessel exhibits minimal luminal irregularities. The vessel is severely calcified. Prox RCA lesion is 60% stenosed. Vessel is the culprit lesion. The lesion is located at the major branch, eccentric and irregular. The lesion is severely calcified. The lesion was previously treated using a drug  eluting stent over 2 years ago. Previously  placed stent displays restenosis. Pressure wire/FFR was performed on the lesion. DFR 0.89.  Acute Marginal Branch Vessel is small in size.  Inferior Septal Vessel is small in size.  Right Posterior Atrioventricular Artery RPAV lesion is 50% stenosed.  First Right Posterolateral Branch Vessel is moderate in size.  Second Right Posterolateral Branch Vessel is moderate in size.  Intervention  No interventions have been documented.   CARDIAC CATHETERIZATION  CARDIAC CATHETERIZATION 11/21/2016  Conclusion Images from the original result were not included.   LESION #1  Prox RCA lesion, 80 %stenosed.  Ost 1st Diag to 1st Diag lesion, 50 %stenosed. Stable.  Post intervention, there is a 0% residual stenosis.  After ORBITAL ATHERECTOMY, A STENT RESOLUTE ONYX 3.5X22 drug eluting stent was successfully placed.  Lesion #2  Prox LAD lesion, 70 %stenosed - calcified, crossed 1st Diag.  After ORBITAL ATHERECTOMY, A STENT RESOLUTE RUEA5.4U98 drug eluting stent was successfully placed.  Post intervention, there is a 0% residual stenosis.  __________________  LV end diastolic pressure is normal.  Successful 2 vessel oral atherectomy based DES PCI on RCA and LAD.   PLAN:  Overnight observation for IV hydration - continue 125 ML per hour for 10 hours, then reduce to 75 ML per hour overnight.  Continue dual antiplatelet therapy for minimum one year (could stop aspirin  after 2-3 months if necessary)  Closely monitor tomorrow or determine stability for discharge, if renal function is stable, he should be fine for discharge tomorrow with plan to check an outpatient chemical panel early next week.   He will follow-up with Dr. Leonel Ram, M.D., M.S. Interventional Cardiologist  Pager # [phone] Phone # [phone] [address] [place], [place] [address]  Findings Coronary Findings Diagnostic  Dominance: Right  Left Main Vessel is  large. The vessel is moderately calcified.  Left Anterior Descending The vessel is moderately calcified. Proximal calcification The lesion is tubular. The lesion is moderately calcified. The lesion is concentric and irregular. The lesion is moderately calcified.  First Diagonal Branch Vessel is moderate in size. The lesion is eccentric.  Lateral First Diagonal Branch Vessel is small in size.  First Septal Branch Vessel is small in size.  Second Diagonal Branch Vessel is small in size.  Second Septal Branch Vessel is small in size.  Third Diagonal Branch Vessel is small in size.  Third Septal Branch Vessel is small in size.  Ramus Intermedius Vessel is moderate in size.  Left Circumflex The lesion is moderately calcified.  First Obtuse Marginal Branch Vessel is small in size.  Lateral Second Obtuse Marginal Branch Vessel is small in size.  Third Obtuse Marginal Branch Vessel is small in size.  Right Coronary Artery Vessel is large. The vessel exhibits minimal luminal irregularities. The vessel is severely calcified. Culprit lesion. The lesion is located at the major branch, eccentric and irregular. The lesion is severely calcified.  Acute Marginal Branch Vessel is small in size.  Inferior Septal Vessel is small in size.  First Right Posterolateral Branch Vessel is moderate in size.  Second Right Posterolateral Branch Vessel is moderate in size.  Intervention  Prox LAD lesion Atherectomy Orbital atherectomy was performed using a CROWN DIAMONDBACK CLASSIC 1.25. The intervention was successful. Angioplasty Lesion length: 36 mm. Lesion crossed with guidewire using a WIRE ASAHI PROWATER 300CM. Pre-stent angioplasty was performed using a BALLOON MAVERICK OTW 3.0X15. Maximum pressure: 12 atm. Inflation time: 20 sec. A STENT  RESOLUTE [id number] drug eluting stent was successfully placed. Minimum lumen area: 3.6 mm. Stent strut is well apposed. Post-stent  angioplasty was performed using a BALLOON [place] EMERGE MR 3.5X20. Maximum pressure: 16 atm. Inflation time: 20 sec. The pre-interventional distal flow is normal (TIMI 3).  The post-interventional distal flow is normal (TIMI 3). The intervention was successful . No complications occurred at this lesion. CATHETER LAUNCHER 6FREBU 3.75 (Yes) - WIRE VIPER ADVANCE COR .012TIP (Yes) used for Atherectomy, exchanged over OTW Balloon for PCI. There is a 0% residual stenosis post intervention.  Prox RCA lesion Atherectomy Orbital atherectomy was performed using a CROWN DIAMONDBACK CLASSIC 1.25. Atherectomy device crossed the lesion. The intervention was successful. Angioplasty Lesion length: 20 mm. Lesion crossed with guidewire using a WIRE ASAHI PROWATER 300CM. Pre-stent angioplasty was performed using a BALLOON MAVERICK OTW 3.0X15. Maximum pressure: 12 atm. Inflation time: 20 sec. A STENT RESOLUTE ONYX 3.5X22 drug eluting stent was successfully placed. Minimum lumen area: 4.1 mm. Stent strut is well apposed. Post-stent angioplasty was performed using a BALLOON [place] EUPHORA RX4.0X15. Maximum pressure: 18 atm. Inflation time: 20 sec. The pre-interventional distal flow is normal (TIMI 3).  The post-interventional distal flow is normal (TIMI 3). The intervention was successful . No complications occurred at this lesion. 6Fr AL 0.75 Guide Catheter -WIRE ASAHI PROWATER 300CM (Yes), exchanged over OTW balloon for WIRE HI TORQ VERSACORE-J 145CM &amp; for Atherectomy &amp; then back for PTCA &amp; PCI. There is a 0% residual stenosis post intervention.   STRESS TESTS  MYOCARDIAL PERFUSION IMAGING 11/01/2016  Narrative  Nuclear stress EF: 44%.  There was no ST segment deviation noted during stress.  The left ventricular ejection fraction is moderately decreased (30-44%).  This is an intermediate risk study.  Findings consistent with prior myocardial infarction.  Small mid and basal inferior wall infarct Small  apical infarct No ischemia EF 44% diffuse hypokinesis   ECHOCARDIOGRAM  ECHOCARDIOGRAM COMPLETE 12/03/2023  Narrative ECHOCARDIOGRAM REPORT    Patient Name:   [name] Date of Exam: 12/03/2023 Medical Rec #:  [id number]      Height:       75.0 in Accession #:    [id number]     Weight:       229.0 lb Date of Birth:  [birth year]      BSA:          2.324 m Patient Age:    89 years       BP:           120/74 mmHg Patient Gender: M              HR:           59 bpm. Exam Location:  [address]  Procedure: 2D Echo, Cardiac Doppler and Color Doppler (Both Spectral and Color Flow Doppler were utilized during procedure).  Indications:    I35.0 Aortic Stenosis  History:        Patient has prior history of Echocardiogram examinations, most recent 10/25/2021. CAD, Pacemaker, CKD, Arrythmias:Atrial Fibrillation; Signs/Symptoms:Shortness of Breath.  Sonographer:    Juventino Oppenheim RCS Referring Phys: [id number] MARK C SKAINS  IMPRESSIONS   1. Left ventricular ejection fraction, by estimation, is 60 to 65%. The left ventricle has normal function. The left ventricle has no regional wall motion abnormalities. There is mild concentric left ventricular hypertrophy. Left ventricular diastolic parameters are indeterminate. 2. Right ventricular systolic function is normal. The right ventricular size is normal. There is normal pulmonary artery systolic  pressure. The estimated right ventricular systolic pressure is 35.0 mmHg. 3. Left atrial size was moderately dilated. 4. The mitral valve is degenerative. Mild to moderate mitral valve regurgitation. No evidence of mitral stenosis. Moderate mitral annular calcification. 5. Tricuspid valve regurgitation is moderate. 6. The aortic valve has an indeterminant number of cusps. There is severe calcifcation of the aortic valve. There is moderate thickening of the aortic valve. Aortic valve regurgitation is mild. Severe aortic valve stenosis. Aortic valve mean  gradient measures 48.0 mmHg. Aortic valve Vmax measures 4.28 m/s. Aortic valve acceleration time measures 116 msec. 7. The inferior vena cava is normal in size with greater than 50% respiratory variability, suggesting right atrial pressure of 3 mmHg.  Comparison(s): Prior images reviewed side by side. Aortic stenosis has worsened and is now severe.  FINDINGS Left Ventricle: Left ventricular ejection fraction, by estimation, is 60 to 65%. The left ventricle has normal function. The left ventricle has no regional wall motion abnormalities. The left ventricular internal cavity size was normal in size. There is mild concentric left ventricular hypertrophy. Left ventricular diastolic parameters are indeterminate. Normal left ventricular filling pressure.  Right Ventricle: The right ventricular size is normal. No increase in right ventricular wall thickness. Right ventricular systolic function is normal. There is normal pulmonary artery systolic pressure. The tricuspid regurgitant velocity is 2.83 m/s, and with an assumed right atrial pressure of 3 mmHg, the estimated right ventricular systolic pressure is 35.0 mmHg.  Left Atrium: Left atrial size was moderately dilated.  Right Atrium: Right atrial size was normal in size.  Pericardium: There is no evidence of pericardial effusion.  Mitral Valve: The mitral valve is degenerative in appearance. Moderate mitral annular calcification. Mild to moderate mitral valve regurgitation, with centrally-directed jet. No evidence of mitral valve stenosis.  Tricuspid Valve: The tricuspid valve is normal in structure. Tricuspid valve regurgitation is moderate.  Aortic Valve: The aortic valve has an indeterminant number of cusps. There is severe calcifcation of the aortic valve. There is moderate thickening of the aortic valve. Aortic valve regurgitation is mild. Aortic regurgitation PHT measures 708 msec. Severe aortic stenosis is present. Aortic valve mean  gradient measures 48.0 mmHg. Aortic valve peak gradient measures 73.3 mmHg. Aortic valve area, by VTI measures 0.63 cm.  Pulmonic Valve: The pulmonic valve was grossly normal. Pulmonic valve regurgitation is trivial. No evidence of pulmonic stenosis.  Aorta: The aortic root and ascending aorta are structurally normal, with no evidence of dilitation.  Venous: The inferior vena cava is normal in size with greater than 50% respiratory variability, suggesting right atrial pressure of 3 mmHg.  IAS/Shunts: No atrial level shunt detected by color flow Doppler.  Additional Comments: A device lead is visualized in the right ventricle and right atrium.   LEFT VENTRICLE PLAX 2D LVIDd:         4.90 cm   Diastology LVIDs:         2.80 cm   LV e' medial:    7.29 cm/s LV PW:         1.30 cm   LV E/e' medial:  10.5 LV IVS:        1.40 cm   LV e' lateral:   10.10 cm/s LVOT diam:     2.00 cm   LV E/e' lateral: 7.6 LV SV:         68 LV SV Index:   29 LVOT Area:     3.14 cm   RIGHT VENTRICLE RV Basal diam:  3.70 cm RV S prime:     9.46 cm/s TAPSE (M-mode): 2.1 cm RVSP:           35.0 mmHg  LEFT ATRIUM             Index        RIGHT ATRIUM           Index LA diam:        5.10 cm 2.19 cm/m   RA Pressure: 3.00 mmHg LA Vol (A2C):   88.3 ml 37.99 ml/m  RA Area:     17.80 cm LA Vol (A4C):   77.9 ml 33.51 ml/m  RA Volume:   46.30 ml  19.92 ml/m LA Biplane Vol: 86.9 ml 37.39 ml/m AORTIC VALVE AV Area (Vmax):    0.56 cm AV Area (Vmean):   0.53 cm AV Area (VTI):     0.63 cm AV Vmax:           428.00 cm/s AV Vmean:          333.000 cm/s AV VTI:            1.080 m AV Peak Grad:      73.3 mmHg AV Mean Grad:      48.0 mmHg LVOT Vmax:         76.07 cm/s LVOT Vmean:        56.433 cm/s LVOT VTI:          0.217 m LVOT/AV VTI ratio: 0.20 AI PHT:            708 msec  AORTA Ao Root diam: 3.50 cm Ao Asc diam:  3.50 cm  MITRAL VALVE                  TRICUSPID VALVE MV Area (PHT):                 TR Peak grad:   32.0 mmHg MV Decel Time:                TR Vmax:        283.00 cm/s MR Peak grad:    77.8 mmHg    Estimated RAP:  3.00 mmHg MR Mean grad:    52.0 mmHg    RVSP:           35.0 mmHg MR Vmax:         441.00 cm/s MR Vmean:        325.0 cm/s   SHUNTS MR PISA:         2.26 cm     Systemic VTI:  0.22 m MR PISA Eff ROA: 15 mm       Systemic Diam: 2.00 cm MR PISA Radius:  0.60 cm MV E velocity: 76.50 cm/s MV A velocity: 87.00 cm/s MV E/A ratio:  0.88  Mihai Croitoru MD Electronically signed by Luana Rumple MD Signature Date/Time: 12/03/2023/2:43:15 PM    Final          ______________________________________________________________________________________________      05/22/2023: ALT 24; BUN 32; Creatinine, Ser 1.98; Hemoglobin 13.4; Magnesium 2.1; Platelets 180; Potassium 4.3; Sodium 143; TSH 1.460   STS RISK CALCULATOR: Pending  NHYA CLASS: 2    ASSESSMENT AND PLAN:   1. Aortic valve stenosis, etiology of cardiac valve disease unspecified   2. CAD in native artery   3. Cardiac pacemaker in situ   4. Paroxysmal atrial fibrillation (HCC)   5. Secondary hypercoagulable state (HCC)   6. Hyperlipidemia LDL goal <70  7. Aortic atherosclerosis (HCC)   8. Primary hypertension   9. Stage 3b chronic kidney disease (HCC)     Aortic stenosis: The patient has developed NYHA class II symptoms of shortness of breath that we can attribute to his severe aortic stenosis.  Will refer him for coronary angiography and right heart catheterization study, TAVR protocol CTA, and cardiothoracic surgical evaluation.  Further recommendations will be offered once all imaging studies have been evaluated. CAD: Status post PCI of the right coronary artery and LAD.  Continue Eliquis  2.5 mg twice daily, atorvastatin  40 mg daily, as needed nitroglycerin , Toprol  XL 50 mg daily. Status post permanent pacemaker: Given indwelling pacemaker and elevated BSA if TAVR is pursued then a  Medtronic valve should be strongly considered.  Given his history of PCI a Medtronic FX plus valve may be desirable. PAF: Continue Eliquis  2.5 mg twice daily, Toprol -XL 50 mg daily. Secondary hypercoagulable state: Continue Eliquis  2.5 mg twice daily. Hyperlipidemia: Continue atorvastatin  40 mg daily; this is being followed by the patient's primary cardiologist. Aortic atherosclerosis: Continue Eliquis  2.5 mg twice daily and atorvastatin  1 mg daily. Hypertension: Continue Toprol  50 mg daily; would refrain from aggressive blood pressure control at this time; can be pursued after aortic stenosis has been managed. Stage IIIb CKD: May benefit from ARB and SGLT2 inhibitor in the future.   I have personally reviewed the patients imaging data as summarized above.  I have reviewed the natural history of aortic stenosis with the patient and family members who are present today. We have discussed the limitations of medical therapy and the poor prognosis associated with symptomatic aortic stenosis. We have also reviewed potential treatment options, including palliative medical therapy, conventional surgical aortic valve replacement, and transcatheter aortic valve replacement. We discussed treatment options in the context of this patient's specific comorbid medical conditions.   All of the patient's questions were answered today. Will make further recommendations based on the results of studies outlined above.   I spent 50 minutes reviewing all clinical data during and prior to this visit including all relevant imaging studies, laboratories, clinical information from other health systems and prior notes from both Cardiology and other specialties, interviewing the patient, conducting a complete physical examination, and coordinating care in order to formulate a comprehensive and personalized evaluation and treatment plan.   Makia Bossi K Nikia Mangino, MD  12/16/2023 7:31 PM    [facility] [address] [place], [place], [place]  [address] Phone: [phone]; Fax: [phone]

## 2023-12-16 ENCOUNTER — Other Ambulatory Visit: Payer: Self-pay

## 2023-12-16 ENCOUNTER — Ambulatory Visit: Attending: Internal Medicine | Admitting: Internal Medicine

## 2023-12-16 ENCOUNTER — Encounter: Payer: Self-pay | Admitting: Internal Medicine

## 2023-12-16 VITALS — BP 116/82 | HR 87 | Ht 75.0 in | Wt 228.0 lb

## 2023-12-16 DIAGNOSIS — I480 Paroxysmal atrial fibrillation: Secondary | ICD-10-CM | POA: Insufficient documentation

## 2023-12-16 DIAGNOSIS — N1832 Chronic kidney disease, stage 3b: Secondary | ICD-10-CM | POA: Insufficient documentation

## 2023-12-16 DIAGNOSIS — Z950 Presence of cardiac pacemaker: Secondary | ICD-10-CM | POA: Diagnosis present

## 2023-12-16 DIAGNOSIS — D6869 Other thrombophilia: Secondary | ICD-10-CM | POA: Diagnosis present

## 2023-12-16 DIAGNOSIS — E785 Hyperlipidemia, unspecified: Secondary | ICD-10-CM | POA: Insufficient documentation

## 2023-12-16 DIAGNOSIS — I2510 Atherosclerotic heart disease of native coronary artery without angina pectoris: Secondary | ICD-10-CM | POA: Diagnosis present

## 2023-12-16 DIAGNOSIS — I10 Essential (primary) hypertension: Secondary | ICD-10-CM | POA: Diagnosis present

## 2023-12-16 DIAGNOSIS — I350 Nonrheumatic aortic (valve) stenosis: Secondary | ICD-10-CM | POA: Diagnosis not present

## 2023-12-16 DIAGNOSIS — I700 Atherosclerosis of aorta: Secondary | ICD-10-CM | POA: Diagnosis present

## 2023-12-16 NOTE — Patient Instructions (Signed)
 Medication Instructions:  No changes *If you need a refill on your cardiac medications before your next appointment, please call your pharmacy*  Lab Work: Today: bmet, cbc   Testing/Procedures: Your physician has requested that you have a cardiac catheterization. Cardiac catheterization is used to diagnose and/or treat various heart conditions. Doctors may recommend this procedure for a number of different reasons. The most common reason is to evaluate chest pain. Chest pain can be a symptom of coronary artery disease (CAD), and cardiac catheterization can show whether plaque is narrowing or blocking your heart's arteries. This procedure is also used to evaluate the valves, as well as measure the blood flow and oxygen  levels in different parts of your heart. For further information please visit [email or web address] Please follow instruction sheet, as given.   Follow-Up: Per Structural Heart Team       Cardiac/Peripheral Catheterization   You are scheduled for a Cardiac Catheterization on Friday, May 9 with Dr. Alyssa Backbone.  1. Please arrive at the [facility] (Main Entrance A) at [facility]: [address] [place], [place] [address] at 10:00 am(This time is TWO hour(s) before your procedure to ensure your preparation).   Free valet parking service is available. You will check in at ADMITTING. The support person will be asked to wait in the waiting room.  It is OK to have someone drop you off and come back when you are ready to be discharged.        Special note: Every effort is made to have your procedure done on time. Please understand that emergencies sometimes delay scheduled procedures.  2. Diet: Do not eat solid foods after midnight.  You may have clear liquids until 5 AM the day of the procedure.  3. Labs: You will need to have blood drawn today: cbc, bmet.  You do not need to be fasting.  4. Medication instructions in preparation for your procedure:   Contrast  Allergy: No   Stop taking Eliquis  after tomorrow, Tuesday May 6.  No Lasix  day of procedure   On the morning of your procedure, take Aspirin  81 mg and any morning medicines NOT listed above.  You may use sips of water.  5. Plan to go home the same day, you will only stay overnight if medically necessary. 6. You MUST have a responsible adult to drive you home. 7. An adult MUST be with you the first 24 hours after you arrive home. 8. Bring a current list of your medications, and the last time and date medication taken. 9. Bring ID and current insurance cards. 10.Please wear clothes that are easy to get on and off and wear slip-on shoes.  Thank you for allowing us  to care for you!   --  Invasive Cardiovascular services

## 2023-12-16 NOTE — Progress Notes (Addendum)
 Pre Surgical Assessment: 5 M Walk Test  23M=16.106ft  5 Meter Walk Test- trial 1: 5.89 seconds 5 Meter Walk Test- trial 2: 4.99 seconds 5 Meter Walk Test- trial 3: 4.78 seconds 5 Meter Walk Test Average: 5.22 seconds  _________________________  Procedure Type: Isolated AVR Perioperative Outcome Estimate % Operative Mortality 5.03% Morbidity & Mortality 12.6% Stroke 0.907% Renal Failure 2.34% Reoperation 3.57% Prolonged Ventilation 5.4% Deep Sternal Wound Infection 0.041% Long Hospital Stay (>14 days) 7.3% Short Hospital Stay (<6 days)* 25.5%

## 2023-12-17 ENCOUNTER — Other Ambulatory Visit: Payer: Self-pay

## 2023-12-17 ENCOUNTER — Encounter: Payer: Self-pay | Admitting: Internal Medicine

## 2023-12-17 DIAGNOSIS — I350 Nonrheumatic aortic (valve) stenosis: Secondary | ICD-10-CM

## 2023-12-17 LAB — BASIC METABOLIC PANEL WITH GFR
BUN/Creatinine Ratio: 22 (ref 10–24)
BUN: 32 mg/dL — ABNORMAL HIGH (ref 8–27)
CO2: 23 mmol/L (ref 20–29)
Calcium: 9.8 mg/dL (ref 8.6–10.2)
Chloride: 105 mmol/L (ref 96–106)
Creatinine, Ser: 1.47 mg/dL — ABNORMAL HIGH (ref 0.76–1.27)
Glucose: 88 mg/dL (ref 70–99)
Potassium: 4.6 mmol/L (ref 3.5–5.2)
Sodium: 143 mmol/L (ref 134–144)
eGFR: 46 mL/min/{1.73_m2} — ABNORMAL LOW (ref >59)

## 2023-12-17 LAB — CBC
Hematocrit: 43.5 % (ref 37.5–51.0)
Hemoglobin: 14.3 g/dL (ref 13.0–17.7)
MCH: 30.8 pg (ref 26.6–33.0)
MCHC: 32.9 g/dL (ref 31.5–35.7)
MCV: 94 fL (ref 79–97)
Platelets: 142 10*3/uL — ABNORMAL LOW (ref 150–450)
RBC: 4.64 x10E6/uL (ref 4.14–5.80)
RDW: 13.7 % (ref 11.6–15.4)
WBC: 8.2 10*3/uL (ref 3.4–10.8)

## 2023-12-18 ENCOUNTER — Telehealth: Payer: Self-pay | Admitting: *Deleted

## 2023-12-18 NOTE — Telephone Encounter (Signed)
 Cardiac Catheterization scheduled at [facility] for: Friday Dec 20, 2023 12 Noon Arrival time [facility] Main Entrance A at: 10 AM  Nothing to eat after midnight prior to procedure, clear liquids until 5 AM day of procedure.  Medication instructions: -Hold:  Eliquis -none 12/18/23 until post procedure  Lasix -day before and day of procedure-per protocol GFR < 60 (46) -Other usual morning medications can be taken with sips of water including aspirin  81 mg.  Plan to go home the same day, you will only stay overnight if medically necessary.  You must have responsible adult to drive you home.  Someone must be with you the first 24 hours after you arrive home.  Reviewed procedure instructions with patient.

## 2023-12-20 ENCOUNTER — Encounter (HOSPITAL_COMMUNITY)
Admission: RE | Disposition: A | Discharge status: Home / Self Care | Payer: Self-pay | Source: Home / Self Care | Attending: Internal Medicine

## 2023-12-20 ENCOUNTER — Ambulatory Visit (HOSPITAL_COMMUNITY)
Admission: RE | Admit: 2023-12-20 | Discharge: 2023-12-20 | Disposition: A | Discharge status: Home / Self Care | Attending: Internal Medicine | Admitting: Internal Medicine

## 2023-12-20 ENCOUNTER — Other Ambulatory Visit: Payer: Self-pay

## 2023-12-20 DIAGNOSIS — Z79899 Other long term (current) drug therapy: Secondary | ICD-10-CM | POA: Diagnosis not present

## 2023-12-20 DIAGNOSIS — Y831 Surgical operation with implant of artificial internal device as the cause of abnormal reaction of the patient, or of later complication, without mention of misadventure at the time of the procedure: Secondary | ICD-10-CM | POA: Diagnosis not present

## 2023-12-20 DIAGNOSIS — Z7902 Long term (current) use of antithrombotics/antiplatelets: Secondary | ICD-10-CM | POA: Insufficient documentation

## 2023-12-20 DIAGNOSIS — I480 Paroxysmal atrial fibrillation: Secondary | ICD-10-CM | POA: Diagnosis not present

## 2023-12-20 DIAGNOSIS — N184 Chronic kidney disease, stage 4 (severe): Secondary | ICD-10-CM | POA: Diagnosis not present

## 2023-12-20 DIAGNOSIS — I350 Nonrheumatic aortic (valve) stenosis: Secondary | ICD-10-CM | POA: Diagnosis not present

## 2023-12-20 DIAGNOSIS — I1310 Hypertensive heart and chronic kidney disease without heart failure, with stage 1 through stage 4 chronic kidney disease, or unspecified chronic kidney disease: Secondary | ICD-10-CM | POA: Insufficient documentation

## 2023-12-20 DIAGNOSIS — I2510 Atherosclerotic heart disease of native coronary artery without angina pectoris: Secondary | ICD-10-CM | POA: Insufficient documentation

## 2023-12-20 DIAGNOSIS — E785 Hyperlipidemia, unspecified: Secondary | ICD-10-CM | POA: Diagnosis not present

## 2023-12-20 DIAGNOSIS — I700 Atherosclerosis of aorta: Secondary | ICD-10-CM | POA: Diagnosis not present

## 2023-12-20 DIAGNOSIS — Z950 Presence of cardiac pacemaker: Secondary | ICD-10-CM | POA: Diagnosis not present

## 2023-12-20 DIAGNOSIS — T82855A Stenosis of coronary artery stent, initial encounter: Secondary | ICD-10-CM | POA: Insufficient documentation

## 2023-12-20 DIAGNOSIS — Z7901 Long term (current) use of anticoagulants: Secondary | ICD-10-CM | POA: Insufficient documentation

## 2023-12-20 DIAGNOSIS — D6869 Other thrombophilia: Secondary | ICD-10-CM | POA: Insufficient documentation

## 2023-12-20 HISTORY — PX: RIGHT/LEFT HEART CATH AND CORONARY ANGIOGRAPHY: CATH118266

## 2023-12-20 LAB — POCT I-STAT 7, (LYTES, BLD GAS, ICA,H+H)
Acid-base deficit: 2 mmol/L (ref 0.0–2.0)
Bicarbonate: 23.3 mmol/L (ref 20.0–28.0)
Calcium, Ion: 1.26 mmol/L (ref 1.15–1.40)
HCT: 37 % — ABNORMAL LOW (ref 39.0–52.0)
Hemoglobin: 12.6 g/dL — ABNORMAL LOW (ref 13.0–17.0)
O2 Saturation: 91 %
Potassium: 4 mmol/L (ref 3.5–5.1)
Sodium: 142 mmol/L (ref 135–145)
TCO2: 24 mmol/L (ref 22–32)
pCO2 arterial: 39.7 mmHg (ref 32–48)
pH, Arterial: 7.377 (ref 7.35–7.45)
pO2, Arterial: 62 mmHg — ABNORMAL LOW (ref 83–108)

## 2023-12-20 LAB — POCT I-STAT EG7
Acid-base deficit: 2 mmol/L (ref 0.0–2.0)
Acid-base deficit: 8 mmol/L — ABNORMAL HIGH (ref 0.0–2.0)
Bicarbonate: 19 mmol/L — ABNORMAL LOW (ref 20.0–28.0)
Bicarbonate: 24.1 mmol/L (ref 20.0–28.0)
Calcium, Ion: 0.97 mmol/L — ABNORMAL LOW (ref 1.15–1.40)
Calcium, Ion: 1.25 mmol/L (ref 1.15–1.40)
HCT: 36 % — ABNORMAL LOW (ref 39.0–52.0)
HCT: 37 % — ABNORMAL LOW (ref 39.0–52.0)
Hemoglobin: 12.2 g/dL — ABNORMAL LOW (ref 13.0–17.0)
Hemoglobin: 12.6 g/dL — ABNORMAL LOW (ref 13.0–17.0)
O2 Saturation: 63 %
O2 Saturation: 72 %
Potassium: 3.2 mmol/L — ABNORMAL LOW (ref 3.5–5.1)
Potassium: 3.8 mmol/L (ref 3.5–5.1)
Sodium: 130 mmol/L — ABNORMAL LOW (ref 135–145)
Sodium: 142 mmol/L (ref 135–145)
TCO2: 20 mmol/L — ABNORMAL LOW (ref 22–32)
TCO2: 25 mmol/L (ref 22–32)
pCO2, Ven: 42.7 mmHg — ABNORMAL LOW (ref 44–60)
pCO2, Ven: 44.5 mmHg (ref 44–60)
pH, Ven: 7.255 (ref 7.25–7.43)
pH, Ven: 7.342 (ref 7.25–7.43)
pO2, Ven: 38 mmHg (ref 32–45)
pO2, Ven: 40 mmHg (ref 32–45)

## 2023-12-20 SURGERY — RIGHT/LEFT HEART CATH AND CORONARY ANGIOGRAPHY
Anesthesia: LOCAL

## 2023-12-20 MED ORDER — SODIUM CHLORIDE 0.9 % WEIGHT BASED INFUSION
3.0000 mL/kg/h | INTRAVENOUS | Status: AC
Start: 2023-12-20 — End: 2023-12-20
  Administered 2023-12-20: 3 mL/kg/h via INTRAVENOUS

## 2023-12-20 MED ORDER — FENTANYL CITRATE (PF) 100 MCG/2ML IJ SOLN
INTRAMUSCULAR | Status: AC
  Filled 2023-12-20: qty 2

## 2023-12-20 MED ORDER — FENTANYL CITRATE (PF) 100 MCG/2ML IJ SOLN
INTRAMUSCULAR | Status: DC | PRN
  Administered 2023-12-20: 25 ug via INTRAVENOUS

## 2023-12-20 MED ORDER — SODIUM CHLORIDE 0.9 % WEIGHT BASED INFUSION
1.0000 mL/kg/h | INTRAVENOUS | Status: DC
Start: 2023-12-20 — End: 2023-12-20

## 2023-12-20 MED ORDER — HEPARIN SODIUM (PORCINE) 1000 UNIT/ML IJ SOLN
INTRAMUSCULAR | Status: DC | PRN
  Administered 2023-12-20: 5000 [IU] via INTRA_ARTERIAL

## 2023-12-20 MED ORDER — IOHEXOL 350 MG/ML SOLN
INTRAVENOUS | Status: DC | PRN
  Administered 2023-12-20: 60 mL via INTRA_ARTERIAL

## 2023-12-20 MED ORDER — VERAPAMIL HCL 2.5 MG/ML IV SOLN
INTRAVENOUS | Status: DC | PRN
  Administered 2023-12-20: 10 mL via INTRA_ARTERIAL

## 2023-12-20 MED ORDER — HEPARIN SODIUM (PORCINE) 1000 UNIT/ML IJ SOLN
INTRAMUSCULAR | Status: AC
  Filled 2023-12-20: qty 10

## 2023-12-20 MED ORDER — LIDOCAINE HCL (PF) 1 % IJ SOLN
INTRAMUSCULAR | Status: DC | PRN
  Administered 2023-12-20: 5 mL

## 2023-12-20 MED ORDER — VERAPAMIL HCL 2.5 MG/ML IV SOLN
INTRAVENOUS | Status: AC
  Filled 2023-12-20: qty 2

## 2023-12-20 MED ORDER — ASPIRIN 81 MG PO CHEW: 81.0000 mg | CHEWABLE_TABLET | ORAL | Status: DC

## 2023-12-20 MED ORDER — LIDOCAINE HCL (PF) 1 % IJ SOLN
INTRAMUSCULAR | Status: AC
  Filled 2023-12-20: qty 30

## 2023-12-20 MED ORDER — MIDAZOLAM HCL 2 MG/2ML IJ SOLN
INTRAMUSCULAR | Status: AC
  Filled 2023-12-20: qty 2

## 2023-12-20 MED ORDER — HEPARIN (PORCINE) IN NACL 1000-0.9 UT/500ML-% IV SOLN
INTRAVENOUS | Status: DC | PRN
  Administered 2023-12-20: 500 mL via INTRA_ARTERIAL
  Administered 2023-12-20: 500 mL via SURGICAL_CAVITY

## 2023-12-20 MED ORDER — MIDAZOLAM HCL 2 MG/2ML IJ SOLN
INTRAMUSCULAR | Status: DC | PRN
  Administered 2023-12-20: 1 mg via INTRAVENOUS

## 2023-12-20 SURGICAL SUPPLY — 11 items
CATH BALLN WEDGE 5F 110CM (CATHETERS) IMPLANT
CATH INFINITI 5FR ANG PIGTAIL (CATHETERS) IMPLANT
CATH INFINITI AMBI 6FR TG (CATHETERS) IMPLANT
DEVICE RAD COMP TR BAND LRG (VASCULAR PRODUCTS) IMPLANT
GLIDESHEATH SLEND SS 6F .021 (SHEATH) IMPLANT
GUIDEWIRE .025 260CM (WIRE) IMPLANT
PACK CARDIAC CATHETERIZATION (CUSTOM PROCEDURE TRAY) ×1 IMPLANT
SET ATX-X65L (MISCELLANEOUS) IMPLANT
SHEATH GLIDE SLENDER 4/5FR (SHEATH) IMPLANT
WIRE EMERALD 3MM-J .035X260CM (WIRE) IMPLANT
WIRE EMERALD ST .035X150CM (WIRE) IMPLANT

## 2023-12-20 NOTE — Interval H&P Note (Signed)
 History and Physical Interval Note:  12/20/2023 11:25 AM  [name]  has presented today for surgery, with the diagnosis of severe aortic stenosis.  The various methods of treatment have been discussed with the patient and family. After consideration of risks, benefits and other options for treatment, the patient has consented to  Procedure(s): RIGHT/LEFT HEART CATH AND CORONARY ANGIOGRAPHY (N/A) as a surgical intervention.  The patient's history has been reviewed, patient examined, no change in status, stable for surgery.  I have reviewed the patient's chart and labs.  Questions were answered to the patient's satisfaction.     Nerissa Constantin K Kayleena Eke

## 2023-12-20 NOTE — Discharge Instructions (Addendum)
 Restart Eliquis  tomorrow morningRadial Site Care  The following information offers guidance on how to care for yourself after your procedure. Your health care provider may also give you more specific instructions. If you have problems or questions, contact your health care provider. What can I expect after the procedure? After the procedure, it is common to have bruising and tenderness in the incision area. Follow these instructions at home: Incision site care  Follow instructions from your health care provider about how to take care of your incision site. Make sure you: Wash your hands with soap and water for at least 20 seconds before and after you change your bandage (dressing). If soap and water are not available, use hand sanitizer. Remove your dressing in 24 hours. Leave stitches (sutures), skin glue, or adhesive strips in place. These skin closures may need to stay in place for 2 weeks or longer. If adhesive strip edges start to loosen and curl up, you may trim the loose edges. Do not remove adhesive strips completely unless your health care provider tells you to do that. Do not take baths, swim, or use a hot tub for at least 1 week. You may shower 24 hours after the procedure or as told by your health care provider. Remove the dressing and gently wash the incision area with plain soap and water. Pat the area dry with a clean towel. Do not rub the site. That could cause bleeding. Do not apply powder or lotion to the site. Check your incision site every day for signs of infection. Check for: Redness, swelling, or pain. Fluid or blood. Warmth. Pus or a bad smell. Activity For 24 hours after the procedure, or as directed by your health care provider: Do not flex or bend the affected arm. Do not push or pull heavy objects with the affected arm. Do not operate machinery or power tools. Do not drive. You should not drive yourself home from the hospital or clinic if you go home during  that time period. You may drive 24 hours after the procedure unless your health care provider tells you not to. Do not lift anything that is heavier than 10 lb (4.5 kg), or the limit that you are told, until your health care provider says that it is safe. Return to your normal activities as told by your health care provider. Ask your health care provider what activities are safe for you and when you can return to work. If you were given a sedative during the procedure, it can affect you for several hours. Do not drive or operate machinery until your health care provider says that it is safe. General instructions Take over-the-counter and prescription medicines only as told by your health care provider. If you will be going home right after the procedure, plan to have a responsible adult care for you for the time you are told. This is important. Keep all follow-up visits. This is important. Contact a health care provider if: You have a fever or chills. You have any of these signs of infection at your incision site: Redness, swelling, or pain. Fluid or blood. Warmth. Pus or a bad smell. Get help right away if: The incision area swells very fast. The incision area is bleeding, and the bleeding does not stop when you hold steady pressure on the area. Your arm or hand becomes pale, cool, tingly, or numb. These symptoms may represent a serious problem that is an emergency. Do not wait to see if the symptoms will  go away. Get medical help right away. Call your local emergency services (911 in the [place] Do not drive yourself to the hospital. Summary After the procedure, it is common to have bruising and tenderness at the incision site. Follow instructions from your health care provider about how to take care of your radial site incision. Check the incision every day for signs of infection. Do not lift anything that is heavier than 10 lb (4.5 kg), or the limit that you are told, until your health care  provider says that it is safe. Get help right away if the incision area swells very fast, you have bleeding at the incision site that will not stop, or your arm or hand becomes pale, cool, or numb. This information is not intended to replace advice given to you by your health care provider. Make sure you discuss any questions you have with your health care provider. Document Revised: 09/18/2020 Document Reviewed: 09/18/2020 Elsevier Patient Education  2024 [employer].

## 2023-12-21 ENCOUNTER — Encounter (HOSPITAL_COMMUNITY): Payer: Self-pay | Admitting: Internal Medicine

## 2023-12-22 ENCOUNTER — Encounter: Payer: Self-pay | Admitting: Family Medicine

## 2023-12-31 ENCOUNTER — Ambulatory Visit: Payer: Self-pay

## 2023-12-31 LAB — BASIC METABOLIC PANEL WITH GFR
BUN/Creatinine Ratio: 20 (ref 10–24)
BUN: 31 mg/dL — ABNORMAL HIGH (ref 8–27)
CO2: 21 mmol/L (ref 20–29)
Calcium: 9.8 mg/dL (ref 8.6–10.2)
Chloride: 103 mmol/L (ref 96–106)
Creatinine, Ser: 1.58 mg/dL — ABNORMAL HIGH (ref 0.76–1.27)
Glucose: 97 mg/dL (ref 70–99)
Potassium: 4.4 mmol/L (ref 3.5–5.2)
Sodium: 143 mmol/L (ref 134–144)
eGFR: 42 mL/min/{1.73_m2} — ABNORMAL LOW (ref >59)

## 2024-01-02 ENCOUNTER — Ambulatory Visit (HOSPITAL_COMMUNITY)
Admission: RE | Admit: 2024-01-02 | Discharge: 2024-01-02 | Disposition: A | Discharge status: Home / Self Care | Source: Ambulatory Visit | Attending: Cardiovascular Disease | Admitting: Cardiovascular Disease

## 2024-01-02 DIAGNOSIS — I350 Nonrheumatic aortic (valve) stenosis: Secondary | ICD-10-CM | POA: Insufficient documentation

## 2024-01-02 MED ORDER — IOHEXOL 350 MG/ML SOLN
100.0000 mL | Freq: Once | INTRAVENOUS | Status: AC | PRN
Start: 2024-01-02 — End: 2024-01-02
  Administered 2024-01-02: 100 mL via INTRAVENOUS

## 2024-01-14 ENCOUNTER — Ambulatory Visit (INDEPENDENT_AMBULATORY_CARE_PROVIDER_SITE_OTHER): Payer: Medicare Other

## 2024-01-14 DIAGNOSIS — I495 Sick sinus syndrome: Secondary | ICD-10-CM

## 2024-01-14 LAB — CUP PACEART REMOTE DEVICE CHECK
Battery Remaining Longevity: 48 mo
Battery Voltage: 2.88 V
Brady Statistic AP VP Percent: 77.38 %
Brady Statistic AP VS Percent: 20.84 %
Brady Statistic AS VP Percent: 0.61 %
Brady Statistic AS VS Percent: 1.17 %
Brady Statistic RA Percent Paced: 99.05 %
Brady Statistic RV Percent Paced: 77.99 %
Date Time Interrogation Session: 20250602220556
Implantable Lead Connection Status: 753985
Implantable Lead Connection Status: 753985
Implantable Lead Implant Date: 20190116
Implantable Lead Implant Date: 20190116
Implantable Lead Location: 753859
Implantable Lead Location: 753860
Implantable Lead Model: 5076
Implantable Lead Model: 5076
Implantable Pulse Generator Implant Date: 20190116
Lead Channel Impedance Value: 285 Ohm
Lead Channel Impedance Value: 304 Ohm
Lead Channel Impedance Value: 342 Ohm
Lead Channel Impedance Value: 380 Ohm
Lead Channel Pacing Threshold Amplitude: 0.875 V
Lead Channel Pacing Threshold Amplitude: 0.875 V
Lead Channel Pacing Threshold Pulse Width: 0.4 ms
Lead Channel Pacing Threshold Pulse Width: 0.4 ms
Lead Channel Sensing Intrinsic Amplitude: 0.125 mV
Lead Channel Sensing Intrinsic Amplitude: 0.125 mV
Lead Channel Sensing Intrinsic Amplitude: 8.75 mV
Lead Channel Sensing Intrinsic Amplitude: 8.75 mV
Lead Channel Setting Pacing Amplitude: 2 V
Lead Channel Setting Pacing Amplitude: 2 V
Lead Channel Setting Pacing Pulse Width: 0.4 ms
Lead Channel Setting Sensing Sensitivity: 1.2 mV
Zone Setting Status: 755011
Zone Setting Status: 755011

## 2024-01-24 ENCOUNTER — Ambulatory Visit: Payer: Self-pay | Admitting: Cardiology

## 2024-02-02 ENCOUNTER — Ambulatory Visit: Payer: Self-pay | Admitting: Internal Medicine

## 2024-02-04 ENCOUNTER — Encounter: Payer: Self-pay | Admitting: Physician Assistant

## 2024-02-04 ENCOUNTER — Other Ambulatory Visit: Payer: Self-pay | Admitting: Physician Assistant

## 2024-02-04 DIAGNOSIS — I350 Nonrheumatic aortic (valve) stenosis: Secondary | ICD-10-CM

## 2024-02-05 ENCOUNTER — Ambulatory Visit: Attending: Surgery | Admitting: Surgery

## 2024-02-05 ENCOUNTER — Encounter: Payer: Self-pay | Admitting: Surgery

## 2024-02-05 VITALS — BP 117/70 | HR 77 | Resp 20 | Ht 75.0 in | Wt 232.0 lb

## 2024-02-05 DIAGNOSIS — I350 Nonrheumatic aortic (valve) stenosis: Secondary | ICD-10-CM | POA: Diagnosis not present

## 2024-02-05 NOTE — Progress Notes (Signed)
 HEART AND VASCULAR CENTER   [facility] VALVE [facility]    [address] [place] [address]             [phone]         CARDIOTHORACIC SURGERY CONSULTATION REPORT  PCP is Katrinka, Garnette KIDD, MD Referring Provider is Lurena Red, MD Primary Cardiologist is Oneil Parchment, MD  Reason for consultation:  Severe aortic stenosis  HPI:  The patient is an 89 year old gentleman with a history of hypertension, hyperlipidemia, stage IIIb chronic kidney disease, coronary artery disease status post DES to the RCA and LAD in 2018 with moderate in-stent restenosis in the RCA treated with DES in 2020, PAF on Eliquis , sick sinus syndrome status post permanent pacemaker placement, and aortic stenosis who was referred for consideration of TAVR.  His most recent echocardiogram on 12/03/2023 showed a mean gradient of 48 mmHg, aortic valve area of 0.56 cm, and dimensionless index of 0.2 with mild aortic insufficiency.  Left ventricular ejection fraction was 60%.  The patient reports worsening exertional shortness of breath and fatigue over the past year which is now occurring with any significant activity.  He denies any chest pain or pressure.  He has had no orthopnea or PND.  He denies peripheral edema.  He has had no dizziness or syncope.  Past Medical History:  Diagnosis Date   Arthritis    back, knees (11/21/2016)   Bladder stones    CAD (coronary artery disease)    11/20/16 PCI with DES--> LAD/RCA   Chronic lower back pain    CKD (chronic kidney disease), stage IV (HCC)    thelbert 11/08/2016   Dilatation of aorta (HCC)    Elevated hemidiaphragm    History of gout    History of kidney stones 12/02/2008   Hyperlipidemia    Hypertension    Myocardial infarction (HCC)    silent (11/21/2016)   NSVT (nonsustained ventricular tachycardia) (HCC)    Pacemaker    PAF (paroxysmal atrial fibrillation) (HCC)    PSA, INCREASED 12/05/2009   Pulmonary disease     Pulmonary emboli (HCC)    Pulmonary hypertension (HCC)    Recurrent deep venous thrombosis (HCC)    UTI (urinary tract infection)     Past Surgical History:  Procedure Laterality Date   CARDIAC CATHETERIZATION  11/14/2016   CATARACT EXTRACTION W/ INTRAOCULAR LENS  IMPLANT, BILATERAL     CORONARY ANGIOPLASTY WITH STENT PLACEMENT  11/21/2016   2 stents    CORONARY ATHERECTOMY N/A 11/21/2016   Procedure: Coronary Atherectomy;  Surgeon: Alm LELON Clay, MD;  Location: [facility] INVASIVE CV LAB;  Service: Cardiovascular;  Laterality: N/A;  RCA and LAD   CORONARY PRESSURE/FFR STUDY N/A 05/22/2019   Procedure: INTRAVASCULAR PRESSURE WIRE/FFR STUDY;  Surgeon: Mady Bruckner, MD;  Location: [facility] INVASIVE CV LAB;  Service: Cardiovascular;  Laterality: N/A;   CORONARY STENT INTERVENTION N/A 11/21/2016   Procedure: Coronary Stent Intervention;  Surgeon: Alm LELON Clay, MD;  Location: [facility] INVASIVE CV LAB;  Service: Cardiovascular;  Laterality: N/A;  RCA and LAD   CYSTOSCOPY W/ STONE MANIPULATION     JOINT REPLACEMENT     KNEE ARTHROSCOPY Left    LAPAROSCOPIC CHOLECYSTECTOMY     LEFT HEART CATH N/A 11/21/2016   Procedure: Left Heart Cath;  Surgeon: Alm LELON Clay, MD;  Location: [facility] INVASIVE CV LAB;  Service: Cardiovascular;  Laterality: N/A;   LITHOTRIPSY     PILONIDAL CYST EXCISION  58  RIGHT/LEFT HEART CATH AND CORONARY ANGIOGRAPHY N/A 11/14/2016   Procedure: Right/Left Heart Cath and Coronary Angiography;  Surgeon: Alm LELON Clay, MD;  Location: [facility] INVASIVE CV LAB;  Service: Cardiovascular;  Laterality: N/A;   RIGHT/LEFT HEART CATH AND CORONARY ANGIOGRAPHY N/A 05/22/2019   Procedure: RIGHT/LEFT HEART CATH AND CORONARY ANGIOGRAPHY;  Surgeon: Mady Bruckner, MD;  Location: [facility] INVASIVE CV LAB;  Service: Cardiovascular;  Laterality: N/A;   RIGHT/LEFT HEART CATH AND CORONARY ANGIOGRAPHY N/A 12/20/2023   Procedure: RIGHT/LEFT HEART CATH AND CORONARY ANGIOGRAPHY;  Surgeon: Wendel Lurena POUR, MD;  Location: [facility]  INVASIVE CV LAB;  Service: Cardiovascular;  Laterality: N/A;   TEMPORARY PACEMAKER N/A 11/21/2016   Procedure: Temporary Pacemaker;  Surgeon: Alm LELON Clay, MD;  Location: [facility] INVASIVE CV LAB;  Service: Cardiovascular;  Laterality: N/A;   TONSILLECTOMY     TOTAL KNEE ARTHROPLASTY Left 09/22/2012   Procedure: TOTAL KNEE ARTHROPLASTY;  Surgeon: Lamar DELENA Millman, MD;  Location: [facility] OR;  Service: Orthopedics;  Laterality: Left;    Family History  Problem Relation Age of Onset   Breast cancer Mother    Heart attack Father        28, smoker    Social History   Socioeconomic History   Marital status: Married    Spouse name: Not on file   Number of children: Not on file   Years of education: Not on file   Highest education level: Not on file  Occupational History   Not on file  Tobacco Use   Smoking status: Never   Smokeless tobacco: Never  Vaping Use   Vaping status: Never Used  Substance and Sexual Activity   Alcohol use: Not Currently    Comment: 11/21/2016 nothing since 2015   Drug use: No   Sexual activity: Yes    Partners: Female  Other Topics Concern   Not on file  Social History Narrative   Married (Wife June patient of Dr. Katrinka). 2 sons. 4 grandkids.    Originally from [place], [place]    Lives at [employer] 6-7 months of the year      Retired from [employer] long time. 27 years of service.       Hobbies: golf   Used to [employer], run [employer]   Social Drivers of [employer]: Low Risk  (03/25/2023)   Overall Financial Resource Strain (CARDIA)    Difficulty of Paying Living Expenses: Not hard at all  Food Insecurity: No Food Insecurity (03/25/2023)   Hunger Vital Sign    Worried About Running Out of Food in the Last Year: Never true    Ran Out of Food in the Last Year: Never true  Transportation Needs: No Transportation Needs (03/25/2023)   PRAPARE - Administrator, Civil Service (Medical): No    Lack of  Transportation (Non-Medical): No  Physical Activity: Sufficiently Active (03/25/2023)   Exercise Vital Sign    Days of Exercise per Week: 7 days    Minutes of Exercise per Session: 30 min  Stress: No Stress Concern Present (03/25/2023)   [employer] of Occupational Health - Occupational Stress Questionnaire    Feeling of Stress : Not at all  Social Connections: Socially Integrated (03/25/2023)   Social Connection and Isolation Panel    Frequency of Communication with Friends and Family: More than three times a week    Frequency of Social Gatherings with Friends and Family: More than three times a week  Attends Religious Services: More than 4 times per year    Active Member of Clubs or Organizations: Yes    Attends Banker Meetings: 1 to 4 times per year    Marital Status: Married  Catering manager Violence: Not At Risk (05/26/2023)   Received from [facility]   HITS    Over the last 12 months how often did your partner physically hurt you?: Never    Over the last 12 months how often did your partner insult you or talk down to you?: Never    Over the last 12 months how often did your partner threaten you with physical harm?: Never    Over the last 12 months how often did your partner scream or curse at you?: Never    Prior to Admission medications   Medication Sig Start Date End Date Taking? Authorizing Provider  allopurinol  (ZYLOPRIM ) 300 MG tablet TAKE 1 TABLET DAILY 10/21/23  Yes Katrinka Garnette KIDD, MD  apixaban  (ELIQUIS ) 2.5 MG TABS tablet TAKE 1 TABLET TWICE A DAY 09/05/23  Yes Jeffrie Oneil BROCKS, MD  atorvastatin  (LIPITOR) 40 MG tablet TAKE 1 TABLET DAILY 02/21/23  Yes Katrinka Garnette KIDD, MD  diphenhydramine -acetaminophen  (TYLENOL  PM) 25-500 MG TABS tablet Take 1 tablet by mouth at bedtime as needed (sleep).   Yes [provider]  finasteride (PROSCAR) 5 MG tablet Take 5 mg by mouth daily. 11/11/20  Yes [provider]  furosemide  (LASIX ) 20 MG tablet  TAKE 1 TABLET TWICE A DAY 09/05/23  Yes Jeffrie Oneil BROCKS, MD  metoprolol  succinate (TOPROL -XL) 50 MG 24 hr tablet Take 1 tablet (50 mg total) by mouth daily. Take with or immediately following a meal. 10/23/23  Yes Skains, Oneil BROCKS, MD  nitroGLYCERIN  (NITROSTAT ) 0.4 MG SL tablet Place 1 tablet (0.4 mg total) under the tongue every 5 (five) minutes as needed for chest pain. 12/12/20  Yes Jeffrie Oneil BROCKS, MD  Tamsulosin  HCl (FLOMAX ) 0.4 MG CAPS Take 0.8 mg by mouth at bedtime.   Yes [provider]    Current Outpatient Medications  Medication Sig Dispense Refill   allopurinol  (ZYLOPRIM ) 300 MG tablet TAKE 1 TABLET DAILY 90 tablet 3   apixaban  (ELIQUIS ) 2.5 MG TABS tablet TAKE 1 TABLET TWICE A DAY 180 tablet 1   atorvastatin  (LIPITOR) 40 MG tablet TAKE 1 TABLET DAILY 90 tablet 3   diphenhydramine -acetaminophen  (TYLENOL  PM) 25-500 MG TABS tablet Take 1 tablet by mouth at bedtime as needed (sleep).     finasteride (PROSCAR) 5 MG tablet Take 5 mg by mouth daily.     furosemide  (LASIX ) 20 MG tablet TAKE 1 TABLET TWICE A DAY 180 tablet 2   metoprolol  succinate (TOPROL -XL) 50 MG 24 hr tablet Take 1 tablet (50 mg total) by mouth daily. Take with or immediately following a meal. 90 tablet 3   nitroGLYCERIN  (NITROSTAT ) 0.4 MG SL tablet Place 1 tablet (0.4 mg total) under the tongue every 5 (five) minutes as needed for chest pain. 25 tablet 11   Tamsulosin  HCl (FLOMAX ) 0.4 MG CAPS Take 0.8 mg by mouth at bedtime.     No current facility-administered medications for this visit.    Allergies  Allergen Reactions   Epinephrine  Palpitations and Other (See Comments)    Heart rate races   Penicillins Hives        Lisinopril Cough   Novocain [Procaine] Palpitations and Other (See Comments)    Makes the heart rate      Review of Systems:  General:  normal appetite, + decreased energy, no weight gain, no weight loss, no fever  Cardiac:  no chest pain with exertion, no chest pain at rest, +SOB with  mild exertion, no resting SOB, no PND, no orthopnea, no palpitations, no arrhythmia, + atrial fibrillation, no LE edema, no dizzy spells, no syncope  Respiratory:  + exertional shortness of breath, no home oxygen , no productive cough, no dry cough, no bronchitis, no wheezing, no hemoptysis, no asthma, no pain with inspiration or cough, no sleep apnea, no CPAP at night  GI:   no difficulty swallowing, no reflux, no frequent heartburn, no hiatal hernia, no abdominal pain, no constipation, no diarrhea, no hematochezia, no hematemesis, no melena  GU:   no dysuria,  no frequency, no urinary tract infection, no hematuria, on enlarged prostate, no kidney stones, + chronic kidney disease  Vascular:  no pain suggestive of claudication, no pain in feet, no leg cramps, no varicose veins, no DVT, no non-healing foot ulcer  Neuro:   no stroke, no TIA's, no seizures, no headaches, no temporary blindness one eye,  no slurred speech, no peripheral neuropathy, no chronic pain, no instability of gait, no memory/cognitive dysfunction  Musculoskeletal: no arthritis, no joint swelling, no myalgias, no difficulty walking, normal mobility   Skin:   no rash, no itching, no skin infections, no pressure sores or ulcerations  Psych:   no anxiety, no depression, on nervousness, on unusual recent stress  Eyes:   no blurry vision, + floaters, no recent vision changes, no glasses or contacts  ENT:   no hearing loss, no loose or painful teeth, no dentures, last saw dentist last year. No dental problems  Hematologic:  + easy bruising, no abnormal bleeding, no clotting disorder, no frequent epistaxis  Endocrine:  no diabetes, does not check CBG's at home     Physical Exam:   BP 117/70   Pulse 77   Resp 20   Ht 6' 3 (1.905 m)   Wt 232 lb (105.2 kg)   SpO2 95% Comment: RA  BMI 29.00 kg/m   General:  Elderly,  well-appearing  HEENT:  Unremarkable, NCAT, PERLA, EOMI  Neck:   no JVD, no bruits, no adenopathy   Chest:   clear  to auscultation, symmetrical breath sounds, no wheezes, no rhonchi   CV:   RRR, 3/6 systolic murmur RSB, no diastolic murmur  Abdomen:  soft, non-tender, no masses   Extremities:  warm, well-perfused, pedal pulses palpable, no lower extremity edema  Rectal/GU  Deferred  Neuro:   Grossly non-focal and symmetrical throughout  Skin:   Clean and dry, no rashes, no breakdown  Diagnostic Tests:  ECHOCARDIOGRAM REPORT       Patient Name:   Elester VEAR LESCHES Date of Exam: 12/03/2023  Medical Rec #:  [id number]      Height:       75.0 in  Accession #:    [id number]     Weight:       229.0 lb  Date of Birth:  1933/09/16      BSA:          2.324 m  Patient Age:    89 years       BP:           120/74 mmHg  Patient Gender: M              HR:           59 bpm.  Exam Location:  [address]   Procedure: 2D Echo, Cardiac Doppler and Color Doppler (Both Spectral and  Color            Flow Doppler were utilized during procedure).   Indications:    I35.0 Aortic Stenosis    History:        Patient has prior history of Echocardiogram examinations,  most                 recent 10/25/2021. CAD, Pacemaker, CKD, Arrythmias:Atrial                  Fibrillation; Signs/Symptoms:Shortness of Breath.    Sonographer:    Waldo Guadalajara RCS  Referring Phys: [id number] MARK C SKAINS   IMPRESSIONS     1. Left ventricular ejection fraction, by estimation, is 60 to 65%. The  left ventricle has normal function. The left ventricle has no regional  wall motion abnormalities. There is mild concentric left ventricular  hypertrophy. Left ventricular diastolic  parameters are indeterminate.   2. Right ventricular systolic function is normal. The right ventricular  size is normal. There is normal pulmonary artery systolic pressure. The  estimated right ventricular systolic pressure is 35.0 mmHg.   3. Left atrial size was moderately dilated.   4. The mitral valve is degenerative. Mild to moderate mitral valve  regurgitation.  No evidence of mitral stenosis. Moderate mitral annular  calcification.   5. Tricuspid valve regurgitation is moderate.   6. The aortic valve has an indeterminant number of cusps. There is severe  calcifcation of the aortic valve. There is moderate thickening of the  aortic valve. Aortic valve regurgitation is mild. Severe aortic valve  stenosis. Aortic valve mean gradient  measures 48.0 mmHg. Aortic valve Vmax measures 4.28 m/s. Aortic valve  acceleration time measures 116 msec.   7. The inferior vena cava is normal in size with greater than 50%  respiratory variability, suggesting right atrial pressure of 3 mmHg.   Comparison(s): Prior images reviewed side by side. Aortic stenosis has  worsened and is now severe.   FINDINGS   Left Ventricle: Left ventricular ejection fraction, by estimation, is 60  to 65%. The left ventricle has normal function. The left ventricle has no  regional wall motion abnormalities. The left ventricular internal cavity  size was normal in size. There is   mild concentric left ventricular hypertrophy. Left ventricular diastolic  parameters are indeterminate. Normal left ventricular filling pressure.   Right Ventricle: The right ventricular size is normal. No increase in  right ventricular wall thickness. Right ventricular systolic function is  normal. There is normal pulmonary artery systolic pressure. The tricuspid  regurgitant velocity is 2.83 m/s, and   with an assumed right atrial pressure of 3 mmHg, the estimated right  ventricular systolic pressure is 35.0 mmHg.   Left Atrium: Left atrial size was moderately dilated.   Right Atrium: Right atrial size was normal in size.   Pericardium: There is no evidence of pericardial effusion.   Mitral Valve: The mitral valve is degenerative in appearance. Moderate  mitral annular calcification. Mild to moderate mitral valve regurgitation,  with centrally-directed jet. No evidence of mitral valve stenosis.    Tricuspid Valve: The tricuspid valve is normal in structure. Tricuspid  valve regurgitation is moderate.   Aortic Valve: The aortic valve has an indeterminant number of cusps. There  is severe calcifcation of the aortic valve. There is moderate thickening  of the aortic valve. Aortic valve regurgitation is mild. Aortic  regurgitation PHT measures 708 msec.  Severe aortic stenosis is present. Aortic valve mean gradient measures  48.0 mmHg. Aortic valve peak gradient measures 73.3 mmHg. Aortic valve  area, by VTI measures 0.63 cm.   Pulmonic Valve: The pulmonic valve was grossly normal. Pulmonic valve  regurgitation is trivial. No evidence of pulmonic stenosis.   Aorta: The aortic root and ascending aorta are structurally normal, with  no evidence of dilitation.   Venous: The inferior vena cava is normal in size with greater than 50%  respiratory variability, suggesting right atrial pressure of 3 mmHg.   IAS/Shunts: No atrial level shunt detected by color flow Doppler.   Additional Comments: A device lead is visualized in the right ventricle  and right atrium.     LEFT VENTRICLE  PLAX 2D  LVIDd:         4.90 cm   Diastology  LVIDs:         2.80 cm   LV e' medial:    7.29 cm/s  LV PW:         1.30 cm   LV E/e' medial:  10.5  LV IVS:        1.40 cm   LV e' lateral:   10.10 cm/s  LVOT diam:     2.00 cm   LV E/e' lateral: 7.6  LV SV:         68  LV SV Index:   29  LVOT Area:     3.14 cm     RIGHT VENTRICLE  RV Basal diam:  3.70 cm  RV S prime:     9.46 cm/s  TAPSE (M-mode): 2.1 cm  RVSP:           35.0 mmHg   LEFT ATRIUM             Index        RIGHT ATRIUM           Index  LA diam:        5.10 cm 2.19 cm/m   RA Pressure: 3.00 mmHg  LA Vol (A2C):   88.3 ml 37.99 ml/m  RA Area:     17.80 cm  LA Vol (A4C):   77.9 ml 33.51 ml/m  RA Volume:   46.30 ml  19.92 ml/m  LA Biplane Vol: 86.9 ml 37.39 ml/m   AORTIC VALVE  AV Area (Vmax):    0.56 cm  AV Area (Vmean):    0.53 cm  AV Area (VTI):     0.63 cm  AV Vmax:           428.00 cm/s  AV Vmean:          333.000 cm/s  AV VTI:            1.080 m  AV Peak Grad:      73.3 mmHg  AV Mean Grad:      48.0 mmHg  LVOT Vmax:         76.07 cm/s  LVOT Vmean:        56.433 cm/s  LVOT VTI:          0.217 m  LVOT/AV VTI ratio: 0.20  AI PHT:            708 msec    AORTA  Ao Root diam: 3.50 cm  Ao Asc diam:  3.50 cm   MITRAL VALVE  TRICUSPID VALVE  MV Area (PHT):                TR Peak grad:   32.0 mmHg  MV Decel Time:                TR Vmax:        283.00 cm/s  MR Peak grad:    77.8 mmHg    Estimated RAP:  3.00 mmHg  MR Mean grad:    52.0 mmHg    RVSP:           35.0 mmHg  MR Vmax:         441.00 cm/s  MR Vmean:        325.0 cm/s   SHUNTS  MR PISA:         2.26 cm     Systemic VTI:  0.22 m  MR PISA Eff ROA: 15 mm       Systemic Diam: 2.00 cm  MR PISA Radius:  0.60 cm  MV E velocity: 76.50 cm/s  MV A velocity: 87.00 cm/s  MV E/A ratio:  0.88   Mihai Croitoru MD  Electronically signed by Jerel Balding MD  Signature Date/Time: 12/03/2023/2:43:15 PM        Final      Physicians  Panel Physicians Referring Physician Case Authorizing Physician  Wendel Lurena POUR, MD (Primary)     Procedures  RIGHT/LEFT HEART CATH AND CORONARY ANGIOGRAPHY   Conclusion      Ost LAD lesion is 5% stenosed.   Prox Cx to Mid Cx lesion is 20% stenosed.   Prox RCA lesion is 80% stenosed.   Ost 1st Diag to 1st Diag lesion is 50% stenosed.   1st Diag lesion is 40% stenosed.   RPAV-2 lesion is 50% stenosed.   RPAV-1 lesion is 90% stenosed.   Non-stenotic Prox LAD lesion was previously treated.   1.  Patent LAD stent. 2.  Mid RCA stent with 70 to 80% in-stent restenosis; given the lack of anginal symptoms no PCI was pursued. 3.  High-grade ostial RPLV disease. 4.  Fick cardiac output of 8.4 L/min and Fick cardiac index of 3.6 L/min/m with following hemodynamics:            Right atrial pressure  mean of 12 mmHg            Right ventricular pressure 48/5 with end-diastolic pressure 15 mmHg            Wedge pressure mean of 18 mmHg with V waves to 20 mmHg            PA pressure of 50/20 with a mean of 31 mmHg            PVR of 1.5 Woods units            PA pulsatility index of 2.5 5.  Capacious iliofemoral vessels bilaterally.   Summary: Given lack of anginal symptoms, will treat coronary artery disease medically.     Indications  Nonrheumatic aortic valve stenosis [I35.0 (ICD-10-CM)]   Procedural Details  Technical Details The patient is an 89 year old male with a history of severe symptomatic aortic stenosis, coronary artery disease with PCI of the LAD and known moderate restenosis of a stent placed in the right coronary artery, status post permanent pacemaker, and paroxysmal atrial fibrillation on Eliquis  who was referred for coronary angiography and right heart catheterization as part of his preprocedural evaluation for treatment of his aortic stenosis.  After  obtaining consent, the patient brought to the cardiac catheterization laboratory and prepped draped sterile fashion.  Xylocaine  was used to anesthetize the right wrist and a 6 French Terumo glide sheath was placed.  5000 units heparin  and 5 mg of verapamil  were administered through the sheath.  A previously placed antecubital IV was exchanged for a 5 [place] Terumo glide sheath.  A 6 [place] TIG catheter was used for selective coronary angiography, a 5 [place] balloontipped catheter was used for right heart catheterization, left 5 French pigtail catheter was used for abdominal aortography.  Specifically the 5 French pigtail catheter was maneuvered to the distal abdominal aorta and abdominal aortography and bilateral iliofemoral angiography was performed.  After review of the angiogram and hemodynamic data, no further interventions were pursued.  A TR band was placed and manual pressure applied to the antecubital site.  There were  no acute complications. Estimated blood loss <50 mL.   During this procedure medications were administered to achieve and maintain moderate conscious sedation while the patient's heart rate, blood pressure, and oxygen  saturation were continuously monitored and I was present face-to-face 100% of this time. Reagan Grade RN and [employer] are independent, trained observers who assisted in the monitoring of the patient's level of consciousness.   Medications (Filter: Administrations occurring from 1312 to 1359 on 12/20/23) midazolam  (VERSED ) injection (mg)  Total dose: 1 mg Date/Time Rate/Dose/Volume Action   12/20/23 1324 1 mg Given   fentaNYL  (SUBLIMAZE ) injection (mcg)  Total dose: 25 mcg Date/Time Rate/Dose/Volume Action   12/20/23 1324 25 mcg Given   lidocaine  (PF) (XYLOCAINE ) 1 % injection (mL)  Total volume: 5 mL Date/Time Rate/Dose/Volume Action   12/20/23 1330 5 mL Given   Radial Cocktail/Verapamil  only (mL)  Total volume: 10 mL Date/Time Rate/Dose/Volume Action   12/20/23 1333 10 mL Given   Heparin  (Porcine) in NaCl 1000-0.9 UT/500ML-% SOLN (mL)  Total volume: 1,000 mL Date/Time Rate/Dose/Volume Action   12/20/23 1336 500 mL Given   1336 500 mL Given   heparin  sodium (porcine) injection (Units)  Total dose: 5,000 Units Date/Time Rate/Dose/Volume Action   12/20/23 1336 5,000 Units Given   iohexol  (OMNIPAQUE ) 350 MG/ML injection (mL)  Total volume: 60 mL Date/Time Rate/Dose/Volume Action   12/20/23 1356 60 mL Given    Sedation Time  Sedation Time Physician-1: 27 minutes 14 seconds Contrast     Administrations occurring from 1312 to 1359 on 12/20/23:  Medication Name Total Dose  iohexol  (OMNIPAQUE ) 350 MG/ML injection 60 mL   Radiation/Fluoro  Fluoro time: 3 (min) DAP: 20404 (mGycm2) Cumulative Air Kerma: 241 (mGy) Complications  Complications documented before study signed (12/20/2023  2:12 PM)   No complications were associated with this study.   Documented by Jaynee Allena CROME, RT - 12/20/2023  2:01 PM     Coronary Findings  Diagnostic Dominance: Right Left Main  Vessel is large. The vessel is moderately calcified.    Left Anterior Descending  The vessel is moderately calcified. Proximal calcification  Ost LAD lesion is 5% stenosed. The lesion is tubular. The lesion is moderately calcified.  Non-stenotic Prox LAD lesion was previously treated. The lesion is concentric and irregular. The lesion is moderately calcified.    First Diagonal Branch  Vessel is moderate in size.  Ost 1st Diag to 1st Diag lesion is 50% stenosed. The lesion is eccentric.  1st Diag lesion is 40% stenosed.    Lateral First Diagonal Branch  Vessel is small in size.    First  Septal Branch  Vessel is small in size.    Second Diagonal Branch  Vessel is small in size.    Second Septal Branch  Vessel is small in size.    Third Diagonal Branch  Vessel is small in size.    Third Septal Branch  Vessel is small in size.    Ramus Intermedius  The vessel exhibits minimal luminal irregularities.    Left Circumflex  Prox Cx to Mid Cx lesion is 20% stenosed. The lesion is moderately calcified.    First Obtuse Marginal Branch  Vessel is small in size.    Lateral Second Obtuse Marginal Branch  Vessel is small in size.    Third Obtuse Marginal Branch  Vessel is small in size.    Right Coronary Artery  Vessel is large. The vessel exhibits minimal luminal irregularities. The vessel is severely calcified.  Prox RCA lesion is 80% stenosed. Vessel is the culprit lesion. The lesion is located at the major branch, eccentric and irregular. The lesion is severely calcified. The lesion was previously treated using a drug eluting stent over 2 years ago. Previously placed stent displays restenosis. DFR 0.89.    Acute Marginal Branch  Vessel is small in size.    Inferior Septal  Vessel is small in size.    Right Posterior Atrioventricular Artery  RPAV-1  lesion is 90% stenosed.  RPAV-2 lesion is 50% stenosed.    First Right Posterolateral Branch  Vessel is moderate in size.    Second Right Posterolateral Branch  Vessel is moderate in size.    Intervention   No interventions have been documented.   Coronary Diagrams  Diagnostic Dominance: Right  Intervention   Implants   No implant documentation for this case.   Syngo Images   Show images for CARDIAC CATHETERIZATION Images on Long Term Storage   Show images for Jose, Corvin to Procedure Log  Procedure Log    Hemo Data  Flowsheet Row Most Recent Value  Fick Cardiac Output 8.42 L/min  Fick Cardiac Output Index 3.6 (L/min)/BSA  RA A Wave 15 mmHg  RA V Wave 15 mmHg  RA Mean 12 mmHg  RV Systolic Pressure 48 mmHg  RV Diastolic Pressure 5 mmHg  RV EDP 15 mmHg  PA Systolic Pressure 50 mmHg  PA Diastolic Pressure 20 mmHg  PA Mean 31 mmHg  PW A Wave 21 mmHg  PW V Wave 20 mmHg  PW Mean 18 mmHg  AO Systolic Pressure 104 mmHg  AO Diastolic Pressure 57 mmHg  AO Mean 76 mmHg  QP/QS 0.68  TPVR Index 12.69 HRUI  TSVR Index 22.5 HRUI  PVR SVR Ratio 0.28  TPVR/TSVR Ratio 0.56    ADDENDUM REPORT: 01/17/2024 20:11   EXAM: OVER-READ INTERPRETATION  CT CHEST   The following report is an over-read performed by radiologist Dr. Oneil Devonshire of [facility] on 01/17/2024. This over-read does not include interpretation of cardiac or coronary anatomy or pathology. The coronary calcium  score/coronary CTA interpretation by the cardiologist is attached.   COMPARISON:  None.   FINDINGS: Cardiovascular: There are no significant extracardiac vascular findings.   Mediastinum/Nodes: There are no enlarged lymph nodes within the visualized mediastinum.   Lungs/Pleura: There is no pleural effusion. The visualized lungs appear clear.   Upper abdomen: No significant findings in the visualized upper abdomen.   Musculoskeletal/Chest wall: No chest wall mass  or suspicious osseous findings within the visualized chest.   IMPRESSION: No significant extracardiac findings within the  visualized chest.     Electronically Signed   By: Oneil Devonshire M.D.   On: 01/17/2024 20:11    Addended by Devonshire Oneil, MD on 01/17/2024  8:13 PM    Study Result  Narrative & Impression  CLINICAL DATA:  [phone] with severe aortic stenosis being evaluated for a TAVR procedure.   EXAM: Cardiac TAVR CT   TECHNIQUE: The patient was scanned on a [employer]. A 120 kV retrospective scan was triggered in the descending thoracic aorta at 111 HU's. Gantry rotation speed was 250 msecs and collimation was .6 mm. No beta blockade or nitro were given. The 3D data set was reconstructed in 5% intervals of the R-R cycle. Systolic and diastolic phases were analyzed on a dedicated work station using MPR, MIP and VRT modes. The patient received 100 cc of contrast.   FINDINGS: Aortic Root:   Aortic valve: trileaflet   Aortic valve calcium  score: 4068   Aortic annulus:   Diameter: 30mm x 25mm   Perimeter: 89mm   Area: 596 mm^2   Calcifications: Moderate calcifications adjacent to right and left cusps   Coronary height: Min Left - 17mm; Min Right - 15mm   Sinotubular height: Left cusp - 25mm; Right cusp - 22mm; Noncoronary cusp - 25mm   LVOT (as measured 3 mm below the annulus):   Diameter: 31mm x 25mm   Area: 643mm^2   Calcifications: Moderate calcifications   Aortic sinus width: Left cusp - 35mm; Right cusp - 34mm; Noncoronary cusp - 37mm   Sinotubular junction width: 31mm x 29mm   Optimum Fluoroscopic Angle for Delivery: LAO 9 CAU 1   Cardiac:   Right atrium: Mild enlargement   Right ventricle: Moderate dilatation   Pulmonary arteries: Dilated main pulmonary artery measuring 31mm   Pulmonary veins: Normal configuration   Left atrium: Moderate enlargement   Left ventricle: Mild dilatation   Pericardium: Normal thickness    Coronary arteries: S/p LAD stent.  Normal origins   Mitral valve: Moderate MAC   IMPRESSION: 1. Tricuspid aortic valve with severe calcifications (AV calcium  score 4068)   2. Aortic annulus measures 30mm x 25mm in diameter with perimeter 89mm and area 548mm^2. Moderate annular calcifications adjacent to left and right cusp, extending into LVOT beneath left cusp. Annular measurements are suitable for delivery of a 29mm Edwards Sapien 3 valve   3.  Sufficient coronary to annulus distance.   4. Optimum Fluoroscopic Angle for Delivery:  LAO 9 CAU 1   5. Dilated main pulmonary artery measuring 31mm   Electronically Signed: By: Lonni Nanas M.D. On: 01/03/2024 16:29       Narrative & Impression  EXAMINATION: CT ANGIO ABDOMEN PELVIS W & WO CONTRAST (accession [id number] [facility]), CT ANGIO CHEST AORTA W & OR WO CONTRAST (accession [id number] [facility])   CLINICAL INDICATION: Male, 89 years old. Aortic valve replacement (TAVR), pre-op  eval   TECHNIQUE: Axial CTA of the chest abdomen and pelvis with and without 100 cc Omnipaque  300 intravenous contrast. Multiplanar and 3D reformations provided. Unless otherwise specified, incidental thyroid , adrenal, renal lesions do not require dedicated imaging follow up. Additionally, any mentioned pulmonary nodules do not require dedicated imaging follow-up based on the Fleischner guidelines unless otherwise specified. Coronary calcifications are not identified unless otherwise specified.   COMPARISON: 12/17/2016   FINDINGS:   The thoracic aorta is nonaneurysmal. Mild atherosclerotic changes are present. The origins of the visualized great vessels appear patent.   There is a partially imaged dual-chamber cardiac pacemaker. The  main pulmonary artery is dilated. The heart is enlarged. There are coronary calcifications and calcification of aortic valve and mitral annulus. There is no free fluid or adenopathy. The trachea and mainstem bronchi  are patent. There is mild elevation of the right hemidiaphragm. Subsegmental atelectatic changes are noted within the lung bases.   The abdominal aorta is nonaneurysmal. Scattered atherosclerotic changes are present. The celiac artery and SMA are patent. The renal arteries and IMA are patent. The common iliac arteries, internal and external iliac arteries, common femoral arteries and visualized portions of the superficial and deep femoral arteries are patent.   The liver appears normal. The gallbladder surgically absent. The spleen is normal. There is fatty infiltration of the pancreas. The adrenals are normal. There is mild bilateral renal cortical scarring. The urinary bladder is normal. The prostate is enlarged. Large and small bowel loops are otherwise within normal limits. No free fluid or lymphadenopathy.   There are degenerative changes of the spine and bony pelvis.   IMPRESSION:   No acute findings. Preprocedural CTA with findings as above.   DOSE REDUCTION: This exam was performed according to our departmental dose-optimization program which includes automated exposure control, adjustment of the mA and/or kV according to patient size and/or use of iterative reconstruction technique.   Electronically signed by: [place] Engel MD 01/02/2024 06:03 PM EDT RP Workstation: [id number]        Impression:  This 89 year old gentleman has stage D, severe, symptomatic aortic stenosis with NYHA class II symptoms of exertional fatigue and shortness of breath consistent with chronic diastolic congestive heart failure.  I have personally reviewed his 2D echocardiogram, cardiac catheterization, and CTA studies.  His echo shows a severely calcified and thickened aortic valve with restricted leaflet mobility.  Mean gradient is 48 mmHg with a valve area of 0.56 cm consistent with severe aortic stenosis.  Left ventricular systolic function is normal.  Cardiac catheterization shows a patent LAD  stent with 70 to 80% in-stent restenosis in the mid RCA.  He has no anginal symptoms so this can be treated medically.  I agree that aortic valve replacement is indicated in this patient for relief of his symptoms and to prevent left ventricular dysfunction.  Given his age and comorbidities I think transcatheter aortic valve replacement would be the best option for treating him.  His gated cardiac CTA shows anatomy suitable for TAVR using a 29 mm SAPIEN 3 valve.  His abdominal and pelvic CTA shows adequate pelvic vascular anatomy to allow transfemoral insertion.  The patient was counseled at length regarding treatment alternatives for management of severe symptomatic aortic stenosis. The risks and benefits of surgical intervention has been discussed in detail. Long-term prognosis with medical therapy was discussed. Alternative approaches such as conventional surgical aortic valve replacement, transcatheter aortic valve replacement, and palliative medical therapy were compared and contrasted at length. This discussion was placed in the context of the patient's own specific clinical presentation and past medical history. All of his questions have been addressed.   Following the decision to proceed with transcatheter aortic valve replacement, a discussion was held regarding what types of management strategies would be attempted intraoperatively in the event of life-threatening complications, including whether or not the patient would be considered a candidate for the use of cardiopulmonary bypass and/or conversion to open sternotomy for attempted surgical intervention.  He is 89 years old but still very active and I think he would be a candidate for emergent sternotomy to manage any intraoperative complications.  The patient has been advised of a variety of complications that might develop including but not limited to risks of death, stroke, paravalvular leak, aortic dissection or other major vascular  complications, aortic annulus rupture, device embolization, cardiac rupture or perforation, mitral regurgitation, acute myocardial infarction, arrhythmia, heart block or bradycardia requiring permanent pacemaker placement, congestive heart failure, respiratory failure, renal failure, pneumonia, infection, other late complications related to structural valve deterioration or migration, or other complications that might ultimately cause a temporary or permanent loss of functional independence or other long term morbidity. The patient provides full informed consent for the procedure as described and all questions were answered.      Plan:  He will be scheduled for transfemoral TAVR using a SAPIEN 3 valve on 02/11/2024.  We will plan to do direct LV pacing since he has a permanent pacemaker in place.  His Eliquis  will be held 5 days preoperatively.   I spent 60 minutes performing this consultation and > 50% of this time was spent face to face counseling and coordinating the care of this patient's severe symptomatic aortic stenosis.   Dorise LOIS Fellers, MD 02/05/2024

## 2024-02-06 ENCOUNTER — Encounter: Payer: Self-pay | Admitting: Cardiology

## 2024-02-06 NOTE — Progress Notes (Signed)
 PERIOPERATIVE PRESCRIPTION FOR IMPLANTED CARDIAC DEVICE PROGRAMMING  Patient Information: Name:  [name]  DOB:  10-09-1933  MRN:  [id number]  Planned Procedure:  Transcatheter Aortic Valve Replacement, Transfemoral Approach  Surgeon:  Dr. Ozell Fell and Dr. Dorise Fellers  Date of Procedure:  02/11/2024  Cautery will be used.  Position during surgery:  Supine   Device Information:  Clinic EP Physician:  Soyla Norton, MD   Device Type:  Pacemaker Manufacturer and Phone #:  [employer]: [id number] Pacemaker Dependent?:  Yes.   Date of Last Device Check:  01/13/24 Remote   07/13/23 - in office  Normal Device Function?:  Yes.    Electrophysiologist's Recommendations:  Have magnet available. Provide continuous ECG monitoring when magnet is used or reprogramming is to be performed.  Procedure will likely interfere with device function.  Device should be programmed:  Asynchronous pacing during procedure and returned to normal programming after procedure  Per Device Clinic Standing Orders, Alan JAYSON Fees, RN  5:06 PM 02/06/2024

## 2024-02-07 ENCOUNTER — Ambulatory Visit: Payer: Self-pay | Admitting: Physician Assistant

## 2024-02-07 ENCOUNTER — Encounter (HOSPITAL_COMMUNITY)
Admission: RE | Admit: 2024-02-07 | Discharge: 2024-02-07 | Disposition: A | Discharge status: Home / Self Care | Source: Ambulatory Visit | Attending: Cardiovascular Disease

## 2024-02-07 ENCOUNTER — Ambulatory Visit (HOSPITAL_COMMUNITY)
Admission: RE | Admit: 2024-02-07 | Discharge: 2024-02-07 | Disposition: A | Discharge status: Home / Self Care | Source: Ambulatory Visit | Attending: Physician Assistant | Admitting: Physician Assistant

## 2024-02-07 ENCOUNTER — Other Ambulatory Visit: Payer: Self-pay

## 2024-02-07 DIAGNOSIS — Z01818 Encounter for other preprocedural examination: Secondary | ICD-10-CM | POA: Insufficient documentation

## 2024-02-07 DIAGNOSIS — I350 Nonrheumatic aortic (valve) stenosis: Secondary | ICD-10-CM

## 2024-02-07 LAB — URINALYSIS, ROUTINE W REFLEX MICROSCOPIC
Bilirubin Urine: NEGATIVE
Glucose, UA: NEGATIVE mg/dL
Ketones, ur: NEGATIVE mg/dL
Leukocytes,Ua: NEGATIVE
Nitrite: NEGATIVE
Protein, ur: NEGATIVE mg/dL
Specific Gravity, Urine: 1.016 (ref 1.005–1.030)
pH: 5 (ref 5.0–8.0)

## 2024-02-07 LAB — CBC
HCT: 44.9 % (ref 39.0–52.0)
Hemoglobin: 14.7 g/dL (ref 13.0–17.0)
MCH: 31.3 pg (ref 26.0–34.0)
MCHC: 32.7 g/dL (ref 30.0–36.0)
MCV: 95.7 fL (ref 80.0–100.0)
Platelets: 132 10*3/uL — ABNORMAL LOW (ref 150–400)
RBC: 4.69 MIL/uL (ref 4.22–5.81)
RDW: 14.2 % (ref 11.5–15.5)
WBC: 6.7 10*3/uL (ref 4.0–10.5)
nRBC: 0 % (ref 0.0–0.2)

## 2024-02-07 LAB — TYPE AND SCREEN
ABO/RH(D): A NEG
Antibody Screen: NEGATIVE

## 2024-02-07 LAB — COMPREHENSIVE METABOLIC PANEL WITH GFR
ALT: 17 U/L (ref 0–44)
AST: 21 U/L (ref 15–41)
Albumin: 3.7 g/dL (ref 3.5–5.0)
Alkaline Phosphatase: 91 U/L (ref 38–126)
Anion gap: 6 (ref 5–15)
BUN: 32 mg/dL — ABNORMAL HIGH (ref 8–23)
CO2: 29 mmol/L (ref 22–32)
Calcium: 9.8 mg/dL (ref 8.9–10.3)
Chloride: 104 mmol/L (ref 98–111)
Creatinine, Ser: 1.57 mg/dL — ABNORMAL HIGH (ref 0.61–1.24)
GFR, Estimated: 42 mL/min — ABNORMAL LOW (ref >60)
Glucose, Bld: 98 mg/dL (ref 70–99)
Potassium: 4.2 mmol/L (ref 3.5–5.1)
Sodium: 139 mmol/L (ref 135–145)
Total Bilirubin: 0.8 mg/dL (ref 0.0–1.2)
Total Protein: 7.1 g/dL (ref 6.5–8.1)

## 2024-02-07 LAB — SURGICAL PCR SCREEN
MRSA, PCR: NEGATIVE
Staphylococcus aureus: NEGATIVE

## 2024-02-07 LAB — PROTIME-INR
INR: 1.1 (ref 0.8–1.2)
Prothrombin Time: 14.6 s (ref 11.4–15.2)

## 2024-02-07 NOTE — Progress Notes (Addendum)
 Patient signed all consents at PAT lab appointment. CHG soap and instructions were given to patient. CHG surgical prep reviewed with patient and all questions answered.  Patients chart send to anesthesia for review. Pt denies any respiratory illness/infection in the last two months.   [employer]onic PPM device orders requested and received 02/06/2024.  [employer] Rep emailed 02/07/2024 with the following information:  Surgery Date  Surgery Start Time  Patient Arrival Time  Surgery Type  Surgeon's Name's  Device Orders

## 2024-02-10 MED ORDER — CEFAZOLIN SODIUM-DEXTROSE 2-4 GM/100ML-% IV SOLN
2.0000 g | INTRAVENOUS | Status: AC
  Administered 2024-02-11: 2 g via INTRAVENOUS
  Filled 2024-02-10: qty 100

## 2024-02-10 MED ORDER — DEXMEDETOMIDINE HCL IN NACL 400 MCG/100ML IV SOLN
0.1000 ug/kg/h | INTRAVENOUS | Status: AC
  Administered 2024-02-11: 105.2 ug via INTRAVENOUS
  Administered 2024-02-11: 1 ug/kg/h via INTRAVENOUS
  Filled 2024-02-10: qty 100

## 2024-02-10 MED ORDER — HEPARIN 30,000 UNITS/1000 ML (OHS) CELLSAVER SOLUTION
Status: DC
  Filled 2024-02-10 (×2): qty 1000

## 2024-02-10 MED ORDER — POTASSIUM CHLORIDE 2 MEQ/ML IV SOLN
80.0000 meq | INTRAVENOUS | Status: DC
  Filled 2024-02-10 (×2): qty 40

## 2024-02-10 MED ORDER — MAGNESIUM SULFATE 50 % IJ SOLN
40.0000 meq | INTRAMUSCULAR | Status: DC
  Filled 2024-02-10 (×2): qty 9.85

## 2024-02-10 MED ORDER — NOREPINEPHRINE 4 MG/250ML-% IV SOLN
0.0000 ug/min | INTRAVENOUS | Status: AC
  Administered 2024-02-11: 1 ug/min via INTRAVENOUS
  Filled 2024-02-10: qty 250

## 2024-02-10 NOTE — H&P (Signed)
 [facility]   MULTIDISCIPLINARY HEART VALVE CLINIC      CARDIOTHORACIC SURGERY ADMISSION HISTORY AND PHYSICAL   PCP is Katrinka Garnette KIDD, MD Referring Provider is Lurena Red, MD Primary Cardiologist is Oneil Parchment, MD   Reason for admission:  Severe aortic stenosis   HPI:   The patient is an 89 year old gentleman with a history of hypertension, hyperlipidemia, stage IIIb chronic kidney disease, coronary artery disease status post DES to the RCA and LAD in 2018 with moderate in-stent restenosis in the RCA treated with DES in 2020, PAF on Eliquis , sick sinus syndrome status post permanent pacemaker placement, and aortic stenosis who was referred for consideration of TAVR.  His most recent echocardiogram on 12/03/2023 showed a mean gradient of 48 mmHg, aortic valve area of 0.56 cm, and dimensionless index of 0.2 with mild aortic insufficiency.  Left ventricular ejection fraction was 60%.  The patient reports worsening exertional shortness of breath and fatigue over the past year which is now occurring with any significant activity.  He denies any chest pain or pressure.  He has had no orthopnea or PND.  He denies peripheral edema.  He has had no dizziness or syncope.       Past Medical History:  Diagnosis Date   Arthritis      back, knees (11/21/2016)   Bladder stones     CAD (coronary artery disease)      11/20/16 PCI with DES--> LAD/RCA   Chronic lower back pain     CKD (chronic kidney disease), stage IV (HCC)      thelbert 11/08/2016   Dilatation of aorta (HCC)     Elevated hemidiaphragm     History of gout     History of kidney stones 12/02/2008   Hyperlipidemia     Hypertension     Myocardial infarction (HCC)      silent (11/21/2016)   NSVT (nonsustained ventricular tachycardia) (HCC)     Pacemaker     PAF (paroxysmal atrial fibrillation) (HCC)     PSA, INCREASED 12/05/2009   Pulmonary disease     Pulmonary emboli (HCC)     Pulmonary hypertension  (HCC)     Recurrent deep venous thrombosis (HCC)     UTI (urinary tract infection)                 Past Surgical History:  Procedure Laterality Date   CARDIAC CATHETERIZATION   11/14/2016   CATARACT EXTRACTION W/ INTRAOCULAR LENS  IMPLANT, BILATERAL       CORONARY ANGIOPLASTY WITH STENT PLACEMENT   11/21/2016    2 stents    CORONARY ATHERECTOMY N/A 11/21/2016    Procedure: Coronary Atherectomy;  Surgeon: Alm LELON Clay, MD;  Location: [facility] INVASIVE CV LAB;  Service: Cardiovascular;  Laterality: N/A;  RCA and LAD   CORONARY PRESSURE/FFR STUDY N/A 05/22/2019    Procedure: INTRAVASCULAR PRESSURE WIRE/FFR STUDY;  Surgeon: Mady Bruckner, MD;  Location: [facility] INVASIVE CV LAB;  Service: Cardiovascular;  Laterality: N/A;   CORONARY STENT INTERVENTION N/A 11/21/2016    Procedure: Coronary Stent Intervention;  Surgeon: Alm LELON Clay, MD;  Location: [facility] INVASIVE CV LAB;  Service: Cardiovascular;  Laterality: N/A;  RCA and LAD   CYSTOSCOPY W/ STONE MANIPULATION       JOINT REPLACEMENT       KNEE ARTHROSCOPY Left     LAPAROSCOPIC CHOLECYSTECTOMY       LEFT HEART CATH N/A 11/21/2016    Procedure: Left Heart  Cath;  Surgeon: Alm LELON Clay, MD;  Location: [facility] INVASIVE CV LAB;  Service: Cardiovascular;  Laterality: N/A;   LITHOTRIPSY       PILONIDAL CYST EXCISION   58   RIGHT/LEFT HEART CATH AND CORONARY ANGIOGRAPHY N/A 11/14/2016    Procedure: Right/Left Heart Cath and Coronary Angiography;  Surgeon: Alm LELON Clay, MD;  Location: [facility] INVASIVE CV LAB;  Service: Cardiovascular;  Laterality: N/A;   RIGHT/LEFT HEART CATH AND CORONARY ANGIOGRAPHY N/A 05/22/2019    Procedure: RIGHT/LEFT HEART CATH AND CORONARY ANGIOGRAPHY;  Surgeon: Mady Bruckner, MD;  Location: [facility] INVASIVE CV LAB;  Service: Cardiovascular;  Laterality: N/A;   RIGHT/LEFT HEART CATH AND CORONARY ANGIOGRAPHY N/A 12/20/2023    Procedure: RIGHT/LEFT HEART CATH AND CORONARY ANGIOGRAPHY;  Surgeon: Wendel Lurena POUR, MD;  Location: [facility] INVASIVE CV  LAB;  Service: Cardiovascular;  Laterality: N/A;   TEMPORARY PACEMAKER N/A 11/21/2016    Procedure: Temporary Pacemaker;  Surgeon: Alm LELON Clay, MD;  Location: [facility] INVASIVE CV LAB;  Service: Cardiovascular;  Laterality: N/A;   TONSILLECTOMY       TOTAL KNEE ARTHROPLASTY Left 09/22/2012    Procedure: TOTAL KNEE ARTHROPLASTY;  Surgeon: Lamar DELENA Millman, MD;  Location: [facility] OR;  Service: Orthopedics;  Laterality: Left;               Family History  Problem Relation Age of Onset   Breast cancer Mother     Heart attack Father          59, smoker          Social History         Socioeconomic History   Marital status: Married      Spouse name: Not on file   Number of children: Not on file   Years of education: Not on file   Highest education level: Not on file  Occupational History   Not on file  Tobacco Use   Smoking status: Never   Smokeless tobacco: Never  Vaping Use   Vaping status: Never Used  Substance and Sexual Activity   Alcohol use: Not Currently      Comment: 11/21/2016 nothing since 2015   Drug use: No   Sexual activity: Yes      Partners: Female  Other Topics Concern   Not on file  Social History Narrative    Married (Wife June patient of Dr. Katrinka). 2 sons. 4 grandkids.     Originally from [place], [place]     Lives at [employer] 6-7 months of the year         Retired from [employer] long time. 27 years of service.          Hobbies: golf    Used to [employer], run [employer]    Social Drivers of [employer]: Low Risk  (03/25/2023)    Overall Financial Resource Strain (CARDIA)     Difficulty of Paying Living Expenses: Not hard at all  Food Insecurity: No Food Insecurity (03/25/2023)    Hunger Vital Sign     Worried About Running Out of Food in the Last Year: Never true     Ran Out of Food in the Last Year: Never true  Transportation Needs: No Transportation Needs (03/25/2023)    PRAPARE - Nutritional therapist (Medical): No     Lack of Transportation (Non-Medical): No  Physical Activity: Sufficiently Active (03/25/2023)  Exercise Vital Sign     Days of Exercise per Week: 7 days     Minutes of Exercise per Session: 30 min  Stress: No Stress Concern Present (03/25/2023)    [employer] of Occupational Health - Occupational Stress Questionnaire     Feeling of Stress : Not at all  Social Connections: Socially Integrated (03/25/2023)    Social Connection and Isolation Panel     Frequency of Communication with Friends and Family: More than three times a week     Frequency of Social Gatherings with Friends and Family: More than three times a week     Attends Religious Services: More than 4 times per year     Active Member of [employer] or Organizations: Yes     Attends Banker Meetings: 1 to 4 times per year     Marital Status: Married  Catering manager Violence: Not At Risk (05/26/2023)    Received from [facility]    HITS     Over the last 12 months how often did your partner physically hurt you?: Never     Over the last 12 months how often did your partner insult you or talk down to you?: Never     Over the last 12 months how often did your partner threaten you with physical harm?: Never     Over the last 12 months how often did your partner scream or curse at you?: Never             Prior to Admission medications   Medication Sig Start Date End Date Taking? Authorizing Provider  allopurinol  (ZYLOPRIM ) 300 MG tablet TAKE 1 TABLET DAILY 10/21/23   Yes Katrinka Garnette KIDD, MD  apixaban  (ELIQUIS ) 2.5 MG TABS tablet TAKE 1 TABLET TWICE A DAY 09/05/23   Yes Jeffrie Oneil BROCKS, MD  atorvastatin  (LIPITOR) 40 MG tablet TAKE 1 TABLET DAILY 02/21/23   Yes Katrinka Garnette KIDD, MD  diphenhydramine -acetaminophen  (TYLENOL  PM) 25-500 MG TABS tablet Take 1 tablet by mouth at bedtime as needed (sleep).     Yes [provider]  finasteride (PROSCAR) 5 MG tablet Take 5 mg by  mouth daily. 11/11/20   Yes [provider]  furosemide  (LASIX ) 20 MG tablet TAKE 1 TABLET TWICE A DAY 09/05/23   Yes Jeffrie Oneil BROCKS, MD  metoprolol  succinate (TOPROL -XL) 50 MG 24 hr tablet Take 1 tablet (50 mg total) by mouth daily. Take with or immediately following a meal. 10/23/23   Yes Skains, Oneil BROCKS, MD  nitroGLYCERIN  (NITROSTAT ) 0.4 MG SL tablet Place 1 tablet (0.4 mg total) under the tongue every 5 (five) minutes as needed for chest pain. 12/12/20   Yes Jeffrie Oneil BROCKS, MD  Tamsulosin  HCl (FLOMAX ) 0.4 MG CAPS Take 0.8 mg by mouth at bedtime.     Yes [provider]            Current Outpatient Medications  Medication Sig Dispense Refill   allopurinol  (ZYLOPRIM ) 300 MG tablet TAKE 1 TABLET DAILY 90 tablet 3   apixaban  (ELIQUIS ) 2.5 MG TABS tablet TAKE 1 TABLET TWICE A DAY 180 tablet 1   atorvastatin  (LIPITOR) 40 MG tablet TAKE 1 TABLET DAILY 90 tablet 3   diphenhydramine -acetaminophen  (TYLENOL  PM) 25-500 MG TABS tablet Take 1 tablet by mouth at bedtime as needed (sleep).       finasteride (PROSCAR) 5 MG tablet Take 5 mg by mouth daily.       furosemide  (LASIX ) 20 MG tablet TAKE 1  TABLET TWICE A DAY 180 tablet 2   metoprolol  succinate (TOPROL -XL) 50 MG 24 hr tablet Take 1 tablet (50 mg total) by mouth daily. Take with or immediately following a meal. 90 tablet 3   nitroGLYCERIN  (NITROSTAT ) 0.4 MG SL tablet Place 1 tablet (0.4 mg total) under the tongue every 5 (five) minutes as needed for chest pain. 25 tablet 11   Tamsulosin  HCl (FLOMAX ) 0.4 MG CAPS Take 0.8 mg by mouth at bedtime.          No current facility-administered medications for this visit.        Allergies       Allergies  Allergen Reactions   Epinephrine  Palpitations and Other (See Comments)      Heart rate races   Penicillins Hives            Lisinopril Cough   Novocain [Procaine] Palpitations and Other (See Comments)      Makes the heart rate            Review of Systems:                General:                      normal appetite, + decreased energy, no weight gain, no weight loss, no fever             Cardiac:                       no chest pain with exertion, no chest pain at rest, +SOB with mild exertion, no resting SOB, no PND, no orthopnea, no palpitations, no arrhythmia, + atrial fibrillation, no LE edema, no dizzy spells, no syncope             Respiratory:                 + exertional shortness of breath, no home oxygen , no productive cough, no dry cough, no bronchitis, no wheezing, no hemoptysis, no asthma, no pain with inspiration or cough, no sleep apnea, no CPAP at night             GI:                               no difficulty swallowing, no reflux, no frequent heartburn, no hiatal hernia, no abdominal pain, no constipation, no diarrhea, no hematochezia, no hematemesis, no melena             GU:                              no dysuria,  no frequency, no urinary tract infection, no hematuria, on enlarged prostate, no kidney stones, + chronic kidney disease             Vascular:                     no pain suggestive of claudication, no pain in feet, no leg cramps, no varicose veins, no DVT, no non-healing foot ulcer             Neuro:                         no stroke, no TIA's, no seizures, no headaches, no temporary blindness one eye,  no  slurred speech, no peripheral neuropathy, no chronic pain, no instability of gait, no memory/cognitive dysfunction             Musculoskeletal:         no arthritis, no joint swelling, no myalgias, no difficulty walking, normal mobility              Skin:                            no rash, no itching, no skin infections, no pressure sores or ulcerations             Psych:                         no anxiety, no depression, on nervousness, on unusual recent stress             Eyes:                           no blurry vision, + floaters, no recent vision changes, no glasses or contacts             ENT:                            no  hearing loss, no loose or painful teeth, no dentures, last saw dentist last year. No dental problems             Hematologic:               + easy bruising, no abnormal bleeding, no clotting disorder, no frequent epistaxis             Endocrine:                   no diabetes, does not check CBG's at home                            Physical Exam:               BP 117/70   Pulse 77   Resp 20   Ht 6' 3 (1.905 m)   Wt 232 lb (105.2 kg)   SpO2 95% Comment: RA  BMI 29.00 kg/m              General:                      Elderly,  well-appearing             HEENT:                       Unremarkable, NCAT, PERLA, EOMI             Neck:                           no JVD, no bruits, no adenopathy              Chest:                          clear to auscultation, symmetrical breath sounds, no wheezes, no rhonchi              CV:  RRR, 3/6 systolic murmur RSB, no diastolic murmur             Abdomen:                    soft, non-tender, no masses              Extremities:                 warm, well-perfused, pedal pulses palpable, no lower extremity edema             Rectal/GU                   Deferred             Neuro:                         Grossly non-focal and symmetrical throughout             Skin:                            Clean and dry, no rashes, no breakdown   Diagnostic Tests:   ECHOCARDIOGRAM REPORT       Patient Name:   Nic VEAR LESCHES Date of Exam: 12/03/2023  Medical Rec #:  [id number]      Height:       75.0 in  Accession #:    [id number]     Weight:       229.0 lb  Date of Birth:  [birth year]      BSA:          2.324 m  Patient Age:    89 years       BP:           120/74 mmHg  Patient Gender: M              HR:           59 bpm.  Exam Location:  [address]   Procedure: 2D Echo, Cardiac Doppler and Color Doppler (Both Spectral and  Color            Flow Doppler were utilized during procedure).   Indications:    I35.0 Aortic Stenosis     History:        Patient has prior history of Echocardiogram examinations,  most                 recent 10/25/2021. CAD, Pacemaker, CKD, Arrythmias:Atrial                  Fibrillation; Signs/Symptoms:Shortness of Breath.    Sonographer:    Waldo Guadalajara RCS  Referring Phys: [id number] MARK C SKAINS   IMPRESSIONS     1. Left ventricular ejection fraction, by estimation, is 60 to 65%. The  left ventricle has normal function. The left ventricle has no regional  wall motion abnormalities. There is mild concentric left ventricular  hypertrophy. Left ventricular diastolic  parameters are indeterminate.   2. Right ventricular systolic function is normal. The right ventricular  size is normal. There is normal pulmonary artery systolic pressure. The  estimated right ventricular systolic pressure is 35.0 mmHg.   3. Left atrial size was moderately dilated.   4. The mitral valve is degenerative. Mild to moderate mitral valve  regurgitation. No evidence of mitral stenosis. Moderate mitral annular  calcification.   5. Tricuspid valve regurgitation  is moderate.   6. The aortic valve has an indeterminant number of cusps. There is severe  calcifcation of the aortic valve. There is moderate thickening of the  aortic valve. Aortic valve regurgitation is mild. Severe aortic valve  stenosis. Aortic valve mean gradient  measures 48.0 mmHg. Aortic valve Vmax measures 4.28 m/s. Aortic valve  acceleration time measures 116 msec.   7. The inferior vena cava is normal in size with greater than 50%  respiratory variability, suggesting right atrial pressure of 3 mmHg.   Comparison(s): Prior images reviewed side by side. Aortic stenosis has  worsened and is now severe.   FINDINGS   Left Ventricle: Left ventricular ejection fraction, by estimation, is 60  to 65%. The left ventricle has normal function. The left ventricle has no  regional wall motion abnormalities. The left ventricular internal cavity  size was  normal in size. There is   mild concentric left ventricular hypertrophy. Left ventricular diastolic  parameters are indeterminate. Normal left ventricular filling pressure.   Right Ventricle: The right ventricular size is normal. No increase in  right ventricular wall thickness. Right ventricular systolic function is  normal. There is normal pulmonary artery systolic pressure. The tricuspid  regurgitant velocity is 2.83 m/s, and   with an assumed right atrial pressure of 3 mmHg, the estimated right  ventricular systolic pressure is 35.0 mmHg.   Left Atrium: Left atrial size was moderately dilated.   Right Atrium: Right atrial size was normal in size.   Pericardium: There is no evidence of pericardial effusion.   Mitral Valve: The mitral valve is degenerative in appearance. Moderate  mitral annular calcification. Mild to moderate mitral valve regurgitation,  with centrally-directed jet. No evidence of mitral valve stenosis.   Tricuspid Valve: The tricuspid valve is normal in structure. Tricuspid  valve regurgitation is moderate.   Aortic Valve: The aortic valve has an indeterminant number of cusps. There  is severe calcifcation of the aortic valve. There is moderate thickening  of the aortic valve. Aortic valve regurgitation is mild. Aortic  regurgitation PHT measures 708 msec.  Severe aortic stenosis is present. Aortic valve mean gradient measures  48.0 mmHg. Aortic valve peak gradient measures 73.3 mmHg. Aortic valve  area, by VTI measures 0.63 cm.   Pulmonic Valve: The pulmonic valve was grossly normal. Pulmonic valve  regurgitation is trivial. No evidence of pulmonic stenosis.   Aorta: The aortic root and ascending aorta are structurally normal, with  no evidence of dilitation.   Venous: The inferior vena cava is normal in size with greater than 50%  respiratory variability, suggesting right atrial pressure of 3 mmHg.   IAS/Shunts: No atrial level shunt detected by color  flow Doppler.   Additional Comments: A device lead is visualized in the right ventricle  and right atrium.     LEFT VENTRICLE  PLAX 2D  LVIDd:         4.90 cm   Diastology  LVIDs:         2.80 cm   LV e' medial:    7.29 cm/s  LV PW:         1.30 cm   LV E/e' medial:  10.5  LV IVS:        1.40 cm   LV e' lateral:   10.10 cm/s  LVOT diam:     2.00 cm   LV E/e' lateral: 7.6  LV SV:         68  LV SV  Index:   29  LVOT Area:     3.14 cm     RIGHT VENTRICLE  RV Basal diam:  3.70 cm  RV S prime:     9.46 cm/s  TAPSE (M-mode): 2.1 cm  RVSP:           35.0 mmHg   LEFT ATRIUM             Index        RIGHT ATRIUM           Index  LA diam:        5.10 cm 2.19 cm/m   RA Pressure: 3.00 mmHg  LA Vol (A2C):   88.3 ml 37.99 ml/m  RA Area:     17.80 cm  LA Vol (A4C):   77.9 ml 33.51 ml/m  RA Volume:   46.30 ml  19.92 ml/m  LA Biplane Vol: 86.9 ml 37.39 ml/m   AORTIC VALVE  AV Area (Vmax):    0.56 cm  AV Area (Vmean):   0.53 cm  AV Area (VTI):     0.63 cm  AV Vmax:           428.00 cm/s  AV Vmean:          333.000 cm/s  AV VTI:            1.080 m  AV Peak Grad:      73.3 mmHg  AV Mean Grad:      48.0 mmHg  LVOT Vmax:         76.07 cm/s  LVOT Vmean:        56.433 cm/s  LVOT VTI:          0.217 m  LVOT/AV VTI ratio: 0.20  AI PHT:            708 msec    AORTA  Ao Root diam: 3.50 cm  Ao Asc diam:  3.50 cm   MITRAL VALVE                  TRICUSPID VALVE  MV Area (PHT):                TR Peak grad:   32.0 mmHg  MV Decel Time:                TR Vmax:        283.00 cm/s  MR Peak grad:    77.8 mmHg    Estimated RAP:  3.00 mmHg  MR Mean grad:    52.0 mmHg    RVSP:           35.0 mmHg  MR Vmax:         441.00 cm/s  MR Vmean:        325.0 cm/s   SHUNTS  MR PISA:         2.26 cm     Systemic VTI:  0.22 m  MR PISA Eff ROA: 15 mm       Systemic Diam: 2.00 cm  MR PISA Radius:  0.60 cm  MV E velocity: 76.50 cm/s  MV A velocity: 87.00 cm/s  MV E/A ratio:  0.88   Mihai Croitoru  MD  Electronically signed by Jerel Balding MD  Signature Date/Time: 12/03/2023/2:43:15 PM        Final        Physicians   Panel Physicians Referring Physician Case Authorizing Physician  Thukkani, Arun K, MD (Primary)  Procedures   RIGHT/LEFT HEART CATH AND CORONARY ANGIOGRAPHY    Conclusion       Ost LAD lesion is 5% stenosed.   Prox Cx to Mid Cx lesion is 20% stenosed.   Prox RCA lesion is 80% stenosed.   Ost 1st Diag to 1st Diag lesion is 50% stenosed.   1st Diag lesion is 40% stenosed.   RPAV-2 lesion is 50% stenosed.   RPAV-1 lesion is 90% stenosed.   Non-stenotic Prox LAD lesion was previously treated.   1.  Patent LAD stent. 2.  Mid RCA stent with 70 to 80% in-stent restenosis; given the lack of anginal symptoms no PCI was pursued. 3.  High-grade ostial RPLV disease. 4.  Fick cardiac output of 8.4 L/min and Fick cardiac index of 3.6 L/min/m with following hemodynamics:            Right atrial pressure mean of 12 mmHg            Right ventricular pressure 48/5 with end-diastolic pressure 15 mmHg            Wedge pressure mean of 18 mmHg with V waves to 20 mmHg            PA pressure of 50/20 with a mean of 31 mmHg            PVR of 1.5 Woods units            PA pulsatility index of 2.5 5.  Capacious iliofemoral vessels bilaterally.   Summary: Given lack of anginal symptoms, will treat coronary artery disease medically.     Indications   Nonrheumatic aortic valve stenosis [I35.0 (ICD-10-CM)]    Procedural Details   Technical Details The patient is an 89 year old male with a history of severe symptomatic aortic stenosis, coronary artery disease with PCI of the LAD and known moderate restenosis of a stent placed in the right coronary artery, status post permanent pacemaker, and paroxysmal atrial fibrillation on Eliquis  who was referred for coronary angiography and right heart catheterization as part of his preprocedural evaluation for treatment of his  aortic stenosis.  After obtaining consent, the patient brought to the cardiac catheterization laboratory and prepped draped sterile fashion.  Xylocaine  was used to anesthetize the right wrist and a 6 French Terumo glide sheath was placed.  5000 units heparin  and 5 mg of verapamil  were administered through the sheath.  A previously placed antecubital IV was exchanged for a 5 [place] Terumo glide sheath.  A 6 [place] TIG catheter was used for selective coronary angiography, a 5 [place] balloontipped catheter was used for right heart catheterization, left 5 French pigtail catheter was used for abdominal aortography.  Specifically the 5 French pigtail catheter was maneuvered to the distal abdominal aorta and abdominal aortography and bilateral iliofemoral angiography was performed.  After review of the angiogram and hemodynamic data, no further interventions were pursued.  A TR band was placed and manual pressure applied to the antecubital site.  There were no acute complications. Estimated blood loss <50 mL.   During this procedure medications were administered to achieve and maintain moderate conscious sedation while the patient's heart rate, blood pressure, and oxygen  saturation were continuously monitored and I was present face-to-face 100% of this time. Reagan Grade RN and [employer] are independent, trained observers who assisted in the monitoring of the patient's level of consciousness.    Medications (Filter: Administrations occurring from 1312 to 1359 on 12/20/23) midazolam  (VERSED ) injection (mg)  Total dose: 1 mg Date/Time Rate/Dose/Volume Action    12/20/23 1324 1 mg Given    fentaNYL  (SUBLIMAZE ) injection (mcg)  Total dose: 25 mcg Date/Time Rate/Dose/Volume Action    12/20/23 1324 25 mcg Given    lidocaine  (PF) (XYLOCAINE ) 1 % injection (mL)  Total volume: 5 mL Date/Time Rate/Dose/Volume Action    12/20/23 1330 5 mL Given    Radial Cocktail/Verapamil  only (mL)  Total  volume: 10 mL Date/Time Rate/Dose/Volume Action    12/20/23 1333 10 mL Given    Heparin  (Porcine) in NaCl 1000-0.9 UT/500ML-% SOLN (mL)  Total volume: 1,000 mL Date/Time Rate/Dose/Volume Action    12/20/23 1336 500 mL Given    1336 500 mL Given    heparin  sodium (porcine) injection (Units)  Total dose: 5,000 Units Date/Time Rate/Dose/Volume Action    12/20/23 1336 5,000 Units Given    iohexol  (OMNIPAQUE ) 350 MG/ML injection (mL)  Total volume: 60 mL Date/Time Rate/Dose/Volume Action    12/20/23 1356 60 mL Given      Sedation Time   Sedation Time Physician-1: 27 minutes 14 seconds Contrast        Administrations occurring from 1312 to 1359 on 12/20/23:  Medication Name Total Dose  iohexol  (OMNIPAQUE ) 350 MG/ML injection 60 mL    Radiation/Fluoro   Fluoro time: 3 (min) DAP: 20404 (mGycm2) Cumulative Air Kerma: 241 (mGy) Complications   Complications documented before study signed (12/20/2023  2:12 PM)    No complications were associated with this study.  Documented by Jaynee Allena CROME, RT - 12/20/2023  2:01 PM      Coronary Findings   Diagnostic Dominance: Right Left Main  Vessel is large. The vessel is moderately calcified.    Left Anterior Descending  The vessel is moderately calcified. Proximal calcification  Ost LAD lesion is 5% stenosed. The lesion is tubular. The lesion is moderately calcified.  Non-stenotic Prox LAD lesion was previously treated. The lesion is concentric and irregular. The lesion is moderately calcified.    First Diagonal Branch  Vessel is moderate in size.  Ost 1st Diag to 1st Diag lesion is 50% stenosed. The lesion is eccentric.  1st Diag lesion is 40% stenosed.    Lateral First Diagonal Branch  Vessel is small in size.    First Septal Branch  Vessel is small in size.    Second Diagonal Branch  Vessel is small in size.    Second Septal Branch  Vessel is small in size.    Third Diagonal Branch  Vessel is small in size.     Third Septal Branch  Vessel is small in size.    Ramus Intermedius  The vessel exhibits minimal luminal irregularities.    Left Circumflex  Prox Cx to Mid Cx lesion is 20% stenosed. The lesion is moderately calcified.    First Obtuse Marginal Branch  Vessel is small in size.    Lateral Second Obtuse Marginal Branch  Vessel is small in size.    Third Obtuse Marginal Branch  Vessel is small in size.    Right Coronary Artery  Vessel is large. The vessel exhibits minimal luminal irregularities. The vessel is severely calcified.  Prox RCA lesion is 80% stenosed. Vessel is the culprit lesion. The lesion is located at the major branch, eccentric and irregular. The lesion is severely calcified. The lesion was previously treated using a drug eluting stent over 2 years ago. Previously placed stent displays restenosis. DFR 0.89.    Acute Marginal Branch  Vessel is small  in size.    Inferior Septal  Vessel is small in size.    Right Posterior Atrioventricular Artery  RPAV-1 lesion is 90% stenosed.  RPAV-2 lesion is 50% stenosed.    First Right Posterolateral Branch  Vessel is moderate in size.    Second Right Posterolateral Branch  Vessel is moderate in size.    Intervention    No interventions have been documented.    Coronary Diagrams   Diagnostic Dominance: Right  Intervention    Implants    No implant documentation for this case.    Syngo Images    Show images for CARDIAC CATHETERIZATION Images on Long Term Storage    Show images for Jceon, Alverio to Procedure Log   Procedure Log    Hemo Data   Flowsheet Row Most Recent Value  Fick Cardiac Output 8.42 L/min  Fick Cardiac Output Index 3.6 (L/min)/BSA  RA A Wave 15 mmHg  RA V Wave 15 mmHg  RA Mean 12 mmHg  RV Systolic Pressure 48 mmHg  RV Diastolic Pressure 5 mmHg  RV EDP 15 mmHg  PA Systolic Pressure 50 mmHg  PA Diastolic Pressure 20 mmHg  PA Mean 31 mmHg  PW A Wave 21 mmHg  PW V Wave 20  mmHg  PW Mean 18 mmHg  AO Systolic Pressure 104 mmHg  AO Diastolic Pressure 57 mmHg  AO Mean 76 mmHg  QP/QS 0.68  TPVR Index 12.69 HRUI  TSVR Index 22.5 HRUI  PVR SVR Ratio 0.28  TPVR/TSVR Ratio 0.56      ADDENDUM REPORT: 01/17/2024 20:11   EXAM: OVER-READ INTERPRETATION  CT CHEST   The following report is an over-read performed by radiologist Dr. Oneil Devonshire of [facility] on 01/17/2024. This over-read does not include interpretation of cardiac or coronary anatomy or pathology. The coronary calcium  score/coronary CTA interpretation by the cardiologist is attached.   COMPARISON:  None.   FINDINGS: Cardiovascular: There are no significant extracardiac vascular findings.   Mediastinum/Nodes: There are no enlarged lymph nodes within the visualized mediastinum.   Lungs/Pleura: There is no pleural effusion. The visualized lungs appear clear.   Upper abdomen: No significant findings in the visualized upper abdomen.   Musculoskeletal/Chest wall: No chest wall mass or suspicious osseous findings within the visualized chest.   IMPRESSION: No significant extracardiac findings within the visualized chest.     Electronically Signed   By: Oneil Devonshire M.D.   On: 01/17/2024 20:11    Addended by Devonshire Oneil, MD on 01/17/2024  8:13 PM    Study Result   Narrative & Impression  CLINICAL DATA:  [phone] with severe aortic stenosis being evaluated for a TAVR procedure.   EXAM: Cardiac TAVR CT   TECHNIQUE: The patient was scanned on a [employer]. A 120 kV retrospective scan was triggered in the descending thoracic aorta at 111 HU's. Gantry rotation speed was 250 msecs and collimation was .6 mm. No beta blockade or nitro were given. The 3D data set was reconstructed in 5% intervals of the R-R cycle. Systolic and diastolic phases were analyzed on a dedicated work station using MPR, MIP and VRT modes. The patient received 100 cc of contrast.    FINDINGS: Aortic Root:   Aortic valve: trileaflet   Aortic valve calcium  score: 4068   Aortic annulus:   Diameter: 30mm x 25mm   Perimeter: 89mm   Area: 596 mm^2   Calcifications: Moderate calcifications adjacent to right and left cusps   Coronary  height: Min Left - 17mm; Min Right - 15mm   Sinotubular height: Left cusp - 25mm; Right cusp - 22mm; Noncoronary cusp - 25mm   LVOT (as measured 3 mm below the annulus):   Diameter: 31mm x 25mm   Area: 671mm^2   Calcifications: Moderate calcifications   Aortic sinus width: Left cusp - 35mm; Right cusp - 34mm; Noncoronary cusp - 37mm   Sinotubular junction width: 31mm x 29mm   Optimum Fluoroscopic Angle for Delivery: LAO 9 CAU 1   Cardiac:   Right atrium: Mild enlargement   Right ventricle: Moderate dilatation   Pulmonary arteries: Dilated main pulmonary artery measuring 31mm   Pulmonary veins: Normal configuration   Left atrium: Moderate enlargement   Left ventricle: Mild dilatation   Pericardium: Normal thickness   Coronary arteries: S/p LAD stent.  Normal origins   Mitral valve: Moderate MAC   IMPRESSION: 1. Tricuspid aortic valve with severe calcifications (AV calcium  score 4068)   2. Aortic annulus measures 30mm x 25mm in diameter with perimeter 89mm and area 531mm^2. Moderate annular calcifications adjacent to left and right cusp, extending into LVOT beneath left cusp. Annular measurements are suitable for delivery of a 29mm Edwards Sapien 3 valve   3.  Sufficient coronary to annulus distance.   4. Optimum Fluoroscopic Angle for Delivery:  LAO 9 CAU 1   5. Dilated main pulmonary artery measuring 31mm   Electronically Signed: By: Lonni Nanas M.D. On: 01/03/2024 16:29          Narrative & Impression  EXAMINATION: CT ANGIO ABDOMEN PELVIS W & WO CONTRAST (accession [id number] [facility]), CT ANGIO CHEST AORTA W & OR WO CONTRAST (accession [id number] [facility])   CLINICAL INDICATION: Male,  89 years old. Aortic valve replacement (TAVR), pre-op  eval   TECHNIQUE: Axial CTA of the chest abdomen and pelvis with and without 100 cc Omnipaque  300 intravenous contrast. Multiplanar and 3D reformations provided. Unless otherwise specified, incidental thyroid , adrenal, renal lesions do not require dedicated imaging follow up. Additionally, any mentioned pulmonary nodules do not require dedicated imaging follow-up based on the Fleischner guidelines unless otherwise specified. Coronary calcifications are not identified unless otherwise specified.   COMPARISON: 12/17/2016   FINDINGS:   The thoracic aorta is nonaneurysmal. Mild atherosclerotic changes are present. The origins of the visualized great vessels appear patent.   There is a partially imaged dual-chamber cardiac pacemaker. The main pulmonary artery is dilated. The heart is enlarged. There are coronary calcifications and calcification of aortic valve and mitral annulus. There is no free fluid or adenopathy. The trachea and mainstem bronchi are patent. There is mild elevation of the right hemidiaphragm. Subsegmental atelectatic changes are noted within the lung bases.   The abdominal aorta is nonaneurysmal. Scattered atherosclerotic changes are present. The celiac artery and SMA are patent. The renal arteries and IMA are patent. The common iliac arteries, internal and external iliac arteries, common femoral arteries and visualized portions of the superficial and deep femoral arteries are patent.   The liver appears normal. The gallbladder surgically absent. The spleen is normal. There is fatty infiltration of the pancreas. The adrenals are normal. There is mild bilateral renal cortical scarring. The urinary bladder is normal. The prostate is enlarged. Large and small bowel loops are otherwise within normal limits. No free fluid or lymphadenopathy.   There are degenerative changes of the spine and bony pelvis.    IMPRESSION:   No acute findings. Preprocedural CTA with findings as above.   DOSE REDUCTION: This exam was  performed according to our departmental dose-optimization program which includes automated exposure control, adjustment of the mA and/or kV according to patient size and/or use of iterative reconstruction technique.   Electronically signed by: [place] Engel MD 01/02/2024 06:03 PM EDT RP Workstation: [id number]            Impression:   This 89 year old gentleman has stage D, severe, symptomatic aortic stenosis with NYHA class II symptoms of exertional fatigue and shortness of breath consistent with chronic diastolic congestive heart failure.  I have personally reviewed his 2D echocardiogram, cardiac catheterization, and CTA studies.  His echo shows a severely calcified and thickened aortic valve with restricted leaflet mobility.  Mean gradient is 48 mmHg with a valve area of 0.56 cm consistent with severe aortic stenosis.  Left ventricular systolic function is normal.  Cardiac catheterization shows a patent LAD stent with 70 to 80% in-stent restenosis in the mid RCA.  He has no anginal symptoms so this can be treated medically.  I agree that aortic valve replacement is indicated in this patient for relief of his symptoms and to prevent left ventricular dysfunction.  Given his age and comorbidities I think transcatheter aortic valve replacement would be the best option for treating him.  His gated cardiac CTA shows anatomy suitable for TAVR using a 29 mm SAPIEN 3 valve.  His abdominal and pelvic CTA shows adequate pelvic vascular anatomy to allow transfemoral insertion.   The patient was counseled at length regarding treatment alternatives for management of severe symptomatic aortic stenosis. The risks and benefits of surgical intervention has been discussed in detail. Long-term prognosis with medical therapy was discussed. Alternative approaches such as conventional surgical aortic valve  replacement, transcatheter aortic valve replacement, and palliative medical therapy were compared and contrasted at length. This discussion was placed in the context of the patient's own specific clinical presentation and past medical history. All of his questions have been addressed.    Following the decision to proceed with transcatheter aortic valve replacement, a discussion was held regarding what types of management strategies would be attempted intraoperatively in the event of life-threatening complications, including whether or not the patient would be considered a candidate for the use of cardiopulmonary bypass and/or conversion to open sternotomy for attempted surgical intervention.  He is 89 years old but still very active and I think he would be a candidate for emergent sternotomy to manage any intraoperative complications.  The patient has been advised of a variety of complications that might develop including but not limited to risks of death, stroke, paravalvular leak, aortic dissection or other major vascular complications, aortic annulus rupture, device embolization, cardiac rupture or perforation, mitral regurgitation, acute myocardial infarction, arrhythmia, heart block or bradycardia requiring permanent pacemaker placement, congestive heart failure, respiratory failure, renal failure, pneumonia, infection, other late complications related to structural valve deterioration or migration, or other complications that might ultimately cause a temporary or permanent loss of functional independence or other long term morbidity. The patient provides full informed consent for the procedure as described and all questions were answered.       Plan:   Transfemoral TAVR using a SAPIEN 3 valve.  We will plan to do direct LV pacing since he has a permanent pacemaker in place.  His Eliquis  will be held 5 days preoperatively.      Dorise LOIS Fellers, MD

## 2024-02-11 ENCOUNTER — Inpatient Hospital Stay (HOSPITAL_COMMUNITY)
Admission: RE | Admit: 2024-02-11 | Discharge: 2024-02-12 | DRG: 267 | Disposition: A | Discharge status: Home / Self Care | Attending: Cardiovascular Disease | Admitting: Cardiovascular Disease

## 2024-02-11 ENCOUNTER — Encounter (HOSPITAL_COMMUNITY): Payer: Self-pay | Admitting: Physician Assistant

## 2024-02-11 ENCOUNTER — Inpatient Hospital Stay (HOSPITAL_COMMUNITY): Payer: Self-pay | Admitting: Certified Registered Nurse Anesthetist

## 2024-02-11 ENCOUNTER — Encounter (HOSPITAL_COMMUNITY): Payer: Self-pay | Admitting: Cardiovascular Disease

## 2024-02-11 ENCOUNTER — Inpatient Hospital Stay (HOSPITAL_COMMUNITY)

## 2024-02-11 ENCOUNTER — Other Ambulatory Visit: Payer: Self-pay

## 2024-02-11 ENCOUNTER — Encounter (HOSPITAL_COMMUNITY)
Admission: RE | Disposition: A | Discharge status: Home / Self Care | Payer: Self-pay | Source: Home / Self Care | Attending: Cardiovascular Disease

## 2024-02-11 DIAGNOSIS — I350 Nonrheumatic aortic (valve) stenosis: Secondary | ICD-10-CM

## 2024-02-11 DIAGNOSIS — I2510 Atherosclerotic heart disease of native coronary artery without angina pectoris: Secondary | ICD-10-CM | POA: Diagnosis present

## 2024-02-11 DIAGNOSIS — Z006 Encounter for examination for normal comparison and control in clinical research program: Secondary | ICD-10-CM

## 2024-02-11 DIAGNOSIS — N184 Chronic kidney disease, stage 4 (severe): Secondary | ICD-10-CM | POA: Diagnosis present

## 2024-02-11 DIAGNOSIS — I130 Hypertensive heart and chronic kidney disease with heart failure and stage 1 through stage 4 chronic kidney disease, or unspecified chronic kidney disease: Secondary | ICD-10-CM | POA: Diagnosis present

## 2024-02-11 DIAGNOSIS — Z888 Allergy status to other drugs, medicaments and biological substances status: Secondary | ICD-10-CM | POA: Diagnosis not present

## 2024-02-11 DIAGNOSIS — Z86718 Personal history of other venous thrombosis and embolism: Secondary | ICD-10-CM

## 2024-02-11 DIAGNOSIS — N400 Enlarged prostate without lower urinary tract symptoms: Secondary | ICD-10-CM | POA: Diagnosis present

## 2024-02-11 DIAGNOSIS — Z7901 Long term (current) use of anticoagulants: Secondary | ICD-10-CM | POA: Diagnosis not present

## 2024-02-11 DIAGNOSIS — I4891 Unspecified atrial fibrillation: Secondary | ICD-10-CM | POA: Diagnosis present

## 2024-02-11 DIAGNOSIS — I10 Essential (primary) hypertension: Secondary | ICD-10-CM | POA: Diagnosis present

## 2024-02-11 DIAGNOSIS — Z79899 Other long term (current) drug therapy: Secondary | ICD-10-CM | POA: Diagnosis not present

## 2024-02-11 DIAGNOSIS — I495 Sick sinus syndrome: Secondary | ICD-10-CM | POA: Diagnosis present

## 2024-02-11 DIAGNOSIS — M109 Gout, unspecified: Secondary | ICD-10-CM | POA: Diagnosis present

## 2024-02-11 DIAGNOSIS — Z87442 Personal history of urinary calculi: Secondary | ICD-10-CM

## 2024-02-11 DIAGNOSIS — Z9049 Acquired absence of other specified parts of digestive tract: Secondary | ICD-10-CM

## 2024-02-11 DIAGNOSIS — E785 Hyperlipidemia, unspecified: Secondary | ICD-10-CM | POA: Diagnosis present

## 2024-02-11 DIAGNOSIS — I493 Ventricular premature depolarization: Secondary | ICD-10-CM | POA: Diagnosis present

## 2024-02-11 DIAGNOSIS — Z8249 Family history of ischemic heart disease and other diseases of the circulatory system: Secondary | ICD-10-CM

## 2024-02-11 DIAGNOSIS — I82409 Acute embolism and thrombosis of unspecified deep veins of unspecified lower extremity: Secondary | ICD-10-CM

## 2024-02-11 DIAGNOSIS — I110 Hypertensive heart disease with heart failure: Secondary | ICD-10-CM | POA: Diagnosis not present

## 2024-02-11 DIAGNOSIS — I25119 Atherosclerotic heart disease of native coronary artery with unspecified angina pectoris: Secondary | ICD-10-CM | POA: Diagnosis not present

## 2024-02-11 DIAGNOSIS — I509 Heart failure, unspecified: Secondary | ICD-10-CM | POA: Diagnosis not present

## 2024-02-11 DIAGNOSIS — Z9841 Cataract extraction status, right eye: Secondary | ICD-10-CM | POA: Diagnosis not present

## 2024-02-11 DIAGNOSIS — I480 Paroxysmal atrial fibrillation: Secondary | ICD-10-CM | POA: Diagnosis present

## 2024-02-11 DIAGNOSIS — Z952 Presence of prosthetic heart valve: Secondary | ICD-10-CM

## 2024-02-11 DIAGNOSIS — Z961 Presence of intraocular lens: Secondary | ICD-10-CM | POA: Diagnosis present

## 2024-02-11 DIAGNOSIS — I272 Pulmonary hypertension, unspecified: Secondary | ICD-10-CM | POA: Diagnosis present

## 2024-02-11 DIAGNOSIS — N183 Chronic kidney disease, stage 3 unspecified: Secondary | ICD-10-CM | POA: Diagnosis present

## 2024-02-11 DIAGNOSIS — Z950 Presence of cardiac pacemaker: Secondary | ICD-10-CM

## 2024-02-11 DIAGNOSIS — I252 Old myocardial infarction: Secondary | ICD-10-CM | POA: Diagnosis not present

## 2024-02-11 DIAGNOSIS — Z9842 Cataract extraction status, left eye: Secondary | ICD-10-CM

## 2024-02-11 DIAGNOSIS — Z96652 Presence of left artificial knee joint: Secondary | ICD-10-CM | POA: Diagnosis present

## 2024-02-11 DIAGNOSIS — Z955 Presence of coronary angioplasty implant and graft: Secondary | ICD-10-CM

## 2024-02-11 DIAGNOSIS — Z803 Family history of malignant neoplasm of breast: Secondary | ICD-10-CM

## 2024-02-11 DIAGNOSIS — Z880 Allergy status to penicillin: Secondary | ICD-10-CM

## 2024-02-11 DIAGNOSIS — Z86711 Personal history of pulmonary embolism: Secondary | ICD-10-CM

## 2024-02-11 DIAGNOSIS — I5032 Chronic diastolic (congestive) heart failure: Secondary | ICD-10-CM | POA: Diagnosis present

## 2024-02-11 HISTORY — PX: INTRAOPERATIVE TRANSTHORACIC ECHOCARDIOGRAM: SHX6523

## 2024-02-11 HISTORY — DX: Personal history of pulmonary embolism: Z86.711

## 2024-02-11 LAB — ECHOCARDIOGRAM LIMITED
AR max vel: 2.24 cm2
AV Area VTI: 2.52 cm2
AV Area mean vel: 2.12 cm2
AV Mean grad: 4 mmHg
AV Peak grad: 5.5 mmHg
Ao pk vel: 1.17 m/s

## 2024-02-11 LAB — POCT I-STAT, CHEM 8
BUN: 25 mg/dL — ABNORMAL HIGH (ref 8–23)
Calcium, Ion: 1.07 mmol/L — ABNORMAL LOW (ref 1.15–1.40)
Chloride: 111 mmol/L (ref 98–111)
Creatinine, Ser: 1.3 mg/dL — ABNORMAL HIGH (ref 0.61–1.24)
Glucose, Bld: 102 mg/dL — ABNORMAL HIGH (ref 70–99)
HCT: 29 % — ABNORMAL LOW (ref 39.0–52.0)
Hemoglobin: 9.9 g/dL — ABNORMAL LOW (ref 13.0–17.0)
Potassium: 3.2 mmol/L — ABNORMAL LOW (ref 3.5–5.1)
Sodium: 144 mmol/L (ref 135–145)
TCO2: 20 mmol/L — ABNORMAL LOW (ref 22–32)

## 2024-02-11 MED ORDER — SODIUM CHLORIDE 0.9 % IV SOLN: 250.0000 mL | INTRAVENOUS | Status: DC | PRN

## 2024-02-11 MED ORDER — CHLORHEXIDINE GLUCONATE 0.12 % MT SOLN
OROMUCOSAL | Status: AC
  Administered 2024-02-11: 15 mL via OROMUCOSAL
  Filled 2024-02-11: qty 15

## 2024-02-11 MED ORDER — TAMSULOSIN HCL 0.4 MG PO CAPS
0.8000 mg | ORAL_CAPSULE | Freq: Every day | ORAL | Status: DC
Start: 2024-02-11 — End: 2024-02-12
  Administered 2024-02-11: 0.8 mg via ORAL
  Filled 2024-02-11: qty 2

## 2024-02-11 MED ORDER — SODIUM CHLORIDE 0.9 % IV SOLN: INTRAVENOUS | Status: DC

## 2024-02-11 MED ORDER — SODIUM CHLORIDE 0.9% FLUSH
3.0000 mL | Freq: Two times a day (BID) | INTRAVENOUS | Status: DC
  Administered 2024-02-11 – 2024-02-12 (×2): 3 mL via INTRAVENOUS

## 2024-02-11 MED ORDER — ACETAMINOPHEN 500 MG PO TABS: 500.0000 mg | ORAL_TABLET | Freq: Every evening | ORAL | Status: DC | PRN

## 2024-02-11 MED ORDER — CEFAZOLIN SODIUM-DEXTROSE 2-4 GM/100ML-% IV SOLN
2.0000 g | Freq: Three times a day (TID) | INTRAVENOUS | Status: AC
  Administered 2024-02-11 – 2024-02-12 (×2): 2 g via INTRAVENOUS
  Filled 2024-02-11 (×2): qty 100

## 2024-02-11 MED ORDER — ACETAMINOPHEN 325 MG PO TABS: 650.0000 mg | ORAL_TABLET | Freq: Four times a day (QID) | ORAL | Status: DC | PRN

## 2024-02-11 MED ORDER — NOREPINEPHRINE 4 MG/250ML-% IV SOLN: 0.0000 ug/min | INTRAVENOUS | Status: DC

## 2024-02-11 MED ORDER — PROPOFOL 10 MG/ML IV BOLUS
INTRAVENOUS | Status: DC | PRN
  Administered 2024-02-11 (×2): 10 mg via INTRAVENOUS
  Administered 2024-02-11: 20 mg via INTRAVENOUS

## 2024-02-11 MED ORDER — LIDOCAINE HCL (PF) 1 % IJ SOLN
INTRAMUSCULAR | Status: DC | PRN
  Administered 2024-02-11 (×2): 5 mL via INTRADERMAL

## 2024-02-11 MED ORDER — CLEVIDIPINE BUTYRATE 0.5 MG/ML IV EMUL
INTRAVENOUS | Status: DC | PRN
  Administered 2024-02-11: 10 mg/h via INTRAVENOUS

## 2024-02-11 MED ORDER — HEPARIN (PORCINE) IN NACL 1000-0.9 UT/500ML-% IV SOLN
INTRAVENOUS | Status: DC | PRN
  Administered 2024-02-11: 1000 mL

## 2024-02-11 MED ORDER — ALLOPURINOL 300 MG PO TABS
300.0000 mg | ORAL_TABLET | Freq: Every day | ORAL | Status: DC
  Administered 2024-02-11 – 2024-02-12 (×2): 300 mg via ORAL
  Filled 2024-02-11 (×2): qty 1

## 2024-02-11 MED ORDER — HEPARIN SODIUM (PORCINE) 1000 UNIT/ML IJ SOLN
INTRAMUSCULAR | Status: DC | PRN
  Administered 2024-02-11: 16000 [IU] via INTRAVENOUS

## 2024-02-11 MED ORDER — OXYCODONE HCL 5 MG PO TABS: 5.0000 mg | ORAL_TABLET | ORAL | Status: DC | PRN

## 2024-02-11 MED ORDER — SODIUM CHLORIDE 0.9 % IV SOLN: INTRAVENOUS | Status: AC

## 2024-02-11 MED ORDER — DIPHENHYDRAMINE-APAP (SLEEP) 25-500 MG PO TABS: 1.0000 | ORAL_TABLET | Freq: Every evening | ORAL | Status: DC | PRN

## 2024-02-11 MED ORDER — DIPHENHYDRAMINE HCL 25 MG PO CAPS: 25.0000 mg | ORAL_CAPSULE | Freq: Every evening | ORAL | Status: DC | PRN

## 2024-02-11 MED ORDER — CHLORHEXIDINE GLUCONATE 4 % EX SOLN: 60.0000 mL | Freq: Once | CUTANEOUS | Status: DC

## 2024-02-11 MED ORDER — ONDANSETRON HCL 4 MG/2ML IJ SOLN: 4.0000 mg | Freq: Four times a day (QID) | INTRAMUSCULAR | Status: DC | PRN

## 2024-02-11 MED ORDER — FINASTERIDE 5 MG PO TABS
5.0000 mg | ORAL_TABLET | Freq: Every day | ORAL | Status: DC
  Administered 2024-02-11 – 2024-02-12 (×2): 5 mg via ORAL
  Filled 2024-02-11 (×2): qty 1

## 2024-02-11 MED ORDER — TRAMADOL HCL 50 MG PO TABS: 50.0000 mg | ORAL_TABLET | ORAL | Status: DC | PRN

## 2024-02-11 MED ORDER — APIXABAN 2.5 MG PO TABS
2.5000 mg | ORAL_TABLET | Freq: Two times a day (BID) | ORAL | Status: DC
  Administered 2024-02-12: 2.5 mg via ORAL
  Filled 2024-02-11: qty 1

## 2024-02-11 MED ORDER — CHLORHEXIDINE GLUCONATE 4 % EX SOLN
60.0000 mL | Freq: Once | CUTANEOUS | Status: DC
  Filled 2024-02-11: qty 60

## 2024-02-11 MED ORDER — IODIXANOL 320 MG/ML IV SOLN
INTRAVENOUS | Status: DC | PRN
  Administered 2024-02-11: 50 mL

## 2024-02-11 MED ORDER — SODIUM CHLORIDE 0.9% FLUSH: 3.0000 mL | INTRAVENOUS | Status: DC | PRN

## 2024-02-11 MED ORDER — PROTAMINE SULFATE 10 MG/ML IV SOLN
INTRAVENOUS | Status: DC | PRN
  Administered 2024-02-11: 10 mg via INTRAVENOUS
  Administered 2024-02-11: 150 mg via INTRAVENOUS

## 2024-02-11 MED ORDER — CHLORHEXIDINE GLUCONATE 4 % EX SOLN
30.0000 mL | CUTANEOUS | Status: DC
  Filled 2024-02-11: qty 30

## 2024-02-11 MED ORDER — MORPHINE SULFATE (PF) 2 MG/ML IV SOLN: 1.0000 mg | INTRAVENOUS | Status: DC | PRN

## 2024-02-11 MED ORDER — ACETAMINOPHEN 650 MG RE SUPP: 650.0000 mg | Freq: Four times a day (QID) | RECTAL | Status: DC | PRN

## 2024-02-11 MED ORDER — CHLORHEXIDINE GLUCONATE 0.12 % MT SOLN: 15.0000 mL | Freq: Once | OROMUCOSAL | Status: AC

## 2024-02-11 MED ORDER — NITROGLYCERIN IN D5W 200-5 MCG/ML-% IV SOLN: 0.0000 ug/min | INTRAVENOUS | Status: DC

## 2024-02-11 MED ORDER — ATORVASTATIN CALCIUM 40 MG PO TABS
40.0000 mg | ORAL_TABLET | Freq: Every day | ORAL | Status: DC
  Administered 2024-02-11: 40 mg via ORAL
  Filled 2024-02-11: qty 1

## 2024-02-11 NOTE — Op Note (Addendum)
 [facility]   MULTIDISCIPLINARY HEART VALVE TEAM   TAVR OPERATIVE NOTE   Date of Procedure:  02/11/2024  Preoperative Diagnosis: Severe Aortic Stenosis   Postoperative Diagnosis: Same   Procedure:   Transcatheter Aortic Valve Replacement - Percutaneous Right Transfemoral Approach  Edwards Sapien 3 Ultra Resilia THV (size 29 mm, model # 9755RSL, serial # [id number])   Co-Surgeons:  Dorise LOIS Fellers, MD and Ozell Fell, MD   Anesthesiologist:  J. Erma, MD  Echocardiographer:  CHRISTELLA. Croitoru, MD  Pre-operative Echo Findings: Severe aortic stenosis Normal left ventricular systolic function  Post-operative Echo Findings: Trace paravalvular leak Normal left ventricular systolic function   BRIEF CLINICAL NOTE AND INDICATIONS FOR SURGERY  This 89 year old gentleman has stage D, severe, symptomatic aortic stenosis with NYHA class II symptoms of exertional fatigue and shortness of breath consistent with chronic diastolic congestive heart failure.  I have personally reviewed his 2D echocardiogram, cardiac catheterization, and CTA studies.  His echo shows a severely calcified and thickened aortic valve with restricted leaflet mobility.  Mean gradient is 48 mmHg with a valve area of 0.56 cm consistent with severe aortic stenosis.  Left ventricular systolic function is normal.  Cardiac catheterization shows a patent LAD stent with 70 to 80% in-stent restenosis in the mid RCA.  He has no anginal symptoms so this can be treated medically.  I agree that aortic valve replacement is indicated in this patient for relief of his symptoms and to prevent left ventricular dysfunction.  Given his age and comorbidities I think transcatheter aortic valve replacement would be the best option for treating him.  His gated cardiac CTA shows anatomy suitable for TAVR using a 29 mm SAPIEN 3 valve.  His abdominal and pelvic CTA shows adequate pelvic vascular anatomy to allow transfemoral insertion.    The patient was counseled at length regarding treatment alternatives for management of severe symptomatic aortic stenosis. The risks and benefits of surgical intervention has been discussed in detail. Long-term prognosis with medical therapy was discussed. Alternative approaches such as conventional surgical aortic valve replacement, transcatheter aortic valve replacement, and palliative medical therapy were compared and contrasted at length. This discussion was placed in the context of the patient's own specific clinical presentation and past medical history. All of his questions have been addressed.    Following the decision to proceed with transcatheter aortic valve replacement, a discussion was held regarding what types of management strategies would be attempted intraoperatively in the event of life-threatening complications, including whether or not the patient would be considered a candidate for the use of cardiopulmonary bypass and/or conversion to open sternotomy for attempted surgical intervention.  He is 89 years old but still very active and I think he would be a candidate for emergent sternotomy to manage any intraoperative complications.  The patient has been advised of a variety of complications that might develop including but not limited to risks of death, stroke, paravalvular leak, aortic dissection or other major vascular complications, aortic annulus rupture, device embolization, cardiac rupture or perforation, mitral regurgitation, acute myocardial infarction, arrhythmia, heart block or bradycardia requiring permanent pacemaker placement, congestive heart failure, respiratory failure, renal failure, pneumonia, infection, other late complications related to structural valve deterioration or migration, or other complications that might ultimately cause a temporary or permanent loss of functional independence or other long term morbidity. The patient provides full informed consent for the  procedure as described and all questions were answered.       DETAILS OF THE  OPERATIVE PROCEDURE  PREPARATION:    The patient was brought to the operating room on the above mentioned date and appropriate monitoring was established by the anesthesia team. The patient was placed in the supine position on the operating table.  Intravenous antibiotics were administered. The patient was monitored closely throughout the procedure under conscious sedation.    Baseline transthoracic echocardiogram was performed. The patient's abdomen and both groins were prepped and draped in a sterile manner. A time out procedure was performed.   PERIPHERAL ACCESS:    Using the modified Seldinger technique, femoral arterial access was obtained with placement of a 6 Fr sheath on the left side.  A pigtail diagnostic catheter was passed through the left arterial sheath under fluoroscopic guidance into the aortic root.  Aortic root angiography was performed in order to determine the optimal angiographic angle for valve deployment.   TRANSFEMORAL ACCESS:   Percutaneous transfemoral access and sheath placement was performed using ultrasound guidance.  The right common femoral artery was cannulated using a micropuncture needle and appropriate location was verified using hand injection angiogram.  A pair of Abbott Perclose percutaneous closure devices were placed and a 6 French sheath replaced into the femoral artery.  The patient was heparinized systemically and ACT verified > 250 seconds.    A 16 Fr transfemoral E-sheath was introduced into the right common femoral artery after progressively dilating over an Amplatz superstiff wire. An AL-2 catheter was used to direct a straight-tip exchange length wire across the native aortic valve into the left ventricle. This was exchanged out for a pigtail catheter and position was confirmed in the LV apex. 29Simultaneous LV and Ao pressures were recorded.  The pigtail catheter was  exchanged for a Safari wire in the LV apex. Direct LV pacing threshold was tested and was satisfactory.  BALLOON AORTIC VALVULOPLASTY:   Not performed   TRANSCATHETER HEART VALVE DEPLOYMENT:   An Edwards Sapien 3 Ultra transcatheter heart valve (size 29 mm) was prepared and crimped per manufacturer's guidelines, and the proper orientation of the valve is confirmed on the [employer] delivery system. The valve was advanced through the introducer sheath using normal technique until in an appropriate position in the abdominal aorta beyond the sheath tip. The balloon was then retracted and using the fine-tuning wheel was centered on the valve. The valve was then advanced across the aortic arch using appropriate flexion of the catheter. The valve was carefully positioned across the aortic valve annulus. The Commander catheter was retracted using normal technique. Once final position of the valve has been confirmed by angiographic assessment, the valve is deployed during rapid ventricular pacing to maintain systolic blood pressure < 50 mmHg and pulse pressure < 10 mmHg. The balloon inflation is held for >3 seconds after reaching full deployment volume. Once the balloon has fully deflated the balloon is retracted into the ascending aorta and valve function is assessed using echocardiography. There is felt to be trace paravalvular leak and no central aortic insufficiency.  The patient's hemodynamic recovery following valve deployment is good.  The deployment balloon and guidewire are both removed.    PROCEDURE COMPLETION:   The sheath was removed and femoral artery closure performed.  Protamine was administered once femoral arterial repair was complete. The pigtail catheter and femoral sheath were removed. A Mynx femoral closure device was utilized following removal of the diagnostic sheath in the left femoral artery.  The patient tolerated the procedure well and is transported to the cath lab recovery  area in stable condition. There were no immediate intraoperative complications. All sponge instrument and needle counts are verified correct at completion of the operation.   No blood products were administered during the operation.  The patient received a total of 50 mL of intravenous contrast during the procedure.   Dorise MARLA Fellers, MD 02/11/2024

## 2024-02-11 NOTE — Discharge Instructions (Signed)

## 2024-02-11 NOTE — Transfer of Care (Signed)
 Immediate Anesthesia Transfer of Care Note  Patient: [name]  Procedure(s) Performed: Transcatheter Aortic Valve Replacement, Transfemoral ECHOCARDIOGRAM, TRANSTHORACIC  Patient Location: Cath Lab  Anesthesia Type:MAC  Level of Consciousness: awake, alert , and oriented  Airway & Oxygen  Therapy: Patient Spontanous Breathing and Patient connected to face mask oxygen   Post-op Assessment: Report given to RN, Post -op Vital signs reviewed and stable, Patient moving all extremities X 4, and Patient able to stick tongue midline  Post vital signs: Reviewed and stable  Last Vitals:  Vitals Value Taken Time  BP 114/69   Temp 98.6   Pulse 61 02/11/24 14:08  Resp 21   SpO2 94     Last Pain:  Vitals:   02/11/24 1239  TempSrc:   PainSc: 0-No pain      Patients Stated Pain Goal: 0 (02/11/24 1145)  Complications: There were no known notable events for this encounter.

## 2024-02-11 NOTE — Discharge Summary (Incomplete)
 HEART AND VASCULAR CENTER   MULTIDISCIPLINARY HEART VALVE TEAM  Discharge Summary    Patient ID: [name] MRN: [id number]; DOB: 06-15-1934  Admit date: 02/11/2024 Discharge date: 02/12/2024  PCP:  Katrinka Garnette KIDD, MD  [facility] Cardiologist:  Oneil Parchment, MD  [facility] Structural heart: Ozell Fell, MD [facility] Electrophysiologist:  Will Gladis Norton, MD   Discharge Diagnoses    Principal Problem:   S/P TAVR (transcatheter aortic valve replacement) Active Problems:   Hyperlipidemia   Essential hypertension   BPH (benign prostatic hyperplasia)   Gout   CKD (chronic kidney disease), stage III (HCC)   Severe aortic stenosis   Coronary artery disease involving native coronary artery with angina pectoris (HCC)   A-fib (HCC)   Recurrent deep venous thrombosis (HCC)   History of pulmonary embolism   Allergies Allergies  Allergen Reactions   Epinephrine  Palpitations and Other (See Comments)    Heart rate races   Penicillins Hives        Lisinopril Cough   Novocain [Procaine] Palpitations and Other (See Comments)    Makes the heart rate    Diagnostic Studies/Procedures    TAVR OPERATIVE NOTE     Date of Procedure:                02/11/2024   Preoperative Diagnosis:      Severe Aortic Stenosis    Postoperative Diagnosis:    Same    Procedure:        Transcatheter Aortic Valve Replacement - Percutaneous  Transfemoral Approach             Edwards Sapien 3 Ultra Resilia THV (size 29 mm, serial # [id number])              Co-Surgeons:                        Dorise Fellers, MD and Ozell Fell, MD   Anesthesiologist:                  Norleen Peoples, MD   Echocardiographer:              Jerel Balding, MD   Pre-operative Echo Findings: Severe aortic stenosis Normal left ventricular systolic function   Post-operative Echo Findings: Trace paravalvular leak Normal left ventricular systolic function _____________    Echo 03/09/24: completed  but pending formal read at the time of discharge   History of Present Illness     [name] is a 89 y.o. 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   The patient reported worsening exertional shortness of breath and fatigue over the past year which is now occurring with any significant activity. His most recent echocardiogram on 12/03/2023 showed EF 60%, mild LVH, severe AS with mean grad 48 mmHg, AVA 0.56 cm2, mild AI, moderate MAC with mild-mod MR & mod TR. [facility] 12/20/23 showed a patent LAD stent with 70 to 80% in-stent restenosis in the mid RCA. Plan to treat medically given lack of angina.   The patient was evaluated by the multidisciplinary valve team and felt to have severe, symptomatic aortic stenosis and to be a suitable candidate for TAVR, which was  set up for 02/11/24.   Hospital Course     Consultants: none   Severe AS:  -- S/p successful TAVR with a 29 mm Edwards Sapien 3 Ultra Resilia THV via the TF approach on 02/11/24.  -- Post operative echo completed but pending formal read. -- Groin sites are stable.  -- Continue Eliquis  2.5mg  BID. -- Met with cardiac rehab to discuss CRP phase II.  -- Plan for discharge home today with close follow up in the outpatient setting.   CAD: -- [facility] 12/20/23 showed a patent LAD stent with 70 to 80% in-stent restenosis in the mid RCA.  -- Plan to treat medically given lack of angina.  -- No aspirin  given OAC.  -- Continue atorvastatin  40mg  daily.    PAF: -- Continue Eliquis  2.5mg  BID. -- Continue Toprol  XL 50mg  daily.  -- Amiodarone  previously discontinued due to prolonged QT syndrome.   CKD stage IIIb: -- Creat baseline 1.47-1.98. -- Creat 1.67 on the day of discharge.    HFpEF: -- LVEDP 14 mm hg at the time of TAVR. -- Continue Lasix  20mg  BID.  HTN: -- BP well controlled.  -- Continue Toprol  XL 50mg  daily. -- Continue Lasix  20mg  BID.  SSS s/p PPM: -- Followed by Dr. Inocencio.   _____________  Discharge Vitals Blood pressure 125/68, pulse 64, temperature 98.6 F (37 C), temperature source Oral, resp. rate 20, height 6' 3 (1.905 m), weight 103.8 kg, SpO2 95%.  Filed Weights   02/11/24 1133 02/12/24 0427  Weight: 103.9 kg 103.8 kg     GEN: Well nourished, well developed in no acute distress NECK: No JVD CARDIAC: RRR, no murmurs, rubs, gallops RESPIRATORY:  Clear to auscultation without rales, wheezing or rhonchi  ABDOMEN: Soft, non-tender, non-distended EXTREMITIES:  No edema; No deformity.  Groin sites clear without hematoma or ecchymosis.    Disposition   Pt is being discharged home today in good condition.  Follow-up Plans & Appointments     Follow-up Information     Sebastian Lamarr SAUNDERS, PA-C. Go on 02/24/2024.   Specialties: Cardiology, Radiology Why: @ 9:05am, please arrive at least 15 minutes early Contact information: [address] [place] [address] [phone]                  Discharge Medications   Allergies as of 02/12/2024       Reactions   Epinephrine  Palpitations, Other (See Comments)   Heart rate races   Penicillins Hives      Lisinopril Cough   Novocain [procaine] Palpitations, Other (See Comments)   Makes the heart rate        Medication List     TAKE these medications    allopurinol  300 MG tablet Commonly known as: ZYLOPRIM  TAKE 1 TABLET DAILY   atorvastatin  40 MG tablet Commonly known as: LIPITOR TAKE 1 TABLET DAILY What changed: when to take this   diphenhydramine -acetaminophen  25-500 MG Tabs tablet Commonly known as: TYLENOL  PM Take 1 tablet by mouth at bedtime as needed (sleep).   Eliquis  2.5 MG Tabs tablet Generic drug: apixaban  TAKE 1 TABLET TWICE A  DAY   finasteride 5 MG tablet Commonly known as: PROSCAR Take 5 mg by mouth daily.   furosemide  20 MG tablet Commonly known as: LASIX  TAKE 1 TABLET TWICE A DAY   metoprolol  succinate 50 MG 24 hr tablet Commonly known as: TOPROL -XL Take 1 tablet (50 mg total) by mouth daily. Take with or immediately following a meal.   nitroGLYCERIN  0.4 MG  SL tablet Commonly known as: NITROSTAT  Place 1 tablet (0.4 mg total) under the tongue every 5 (five) minutes as needed for chest pain.   tamsulosin  0.4 MG Caps capsule Commonly known as: FLOMAX  Take 0.8 mg by mouth at bedtime.            Outstanding Labs/Studies   none  ______________________  Duration of Discharge Encounter: APP Time: 20 minutes    Signed, Lamarr Hummer, PA-C 02/12/2024, 11:35 AM [phone]  The patient is independently interviewed and examined.  I agree with the clinical findings, assessment, and plan as outlined above by Izetta Hummer, PA-C.  Findings from my exam today are below:  Vitals:   02/12/24 1044 02/12/24 1200  BP: 125/68 128/89  Pulse:  63  Resp: 20 18  Temp: 98.6 F (37 C)   SpO2: 95% 96%     Pt is alert and oriented, NAD HEENT: normal Neck: JVP - normal Lungs: CTA bilaterally CV: RRR with 2/6 systolic murmur at the left lower sternal border Abd: soft, NT, Positive BS, no hepatomegaly Ext: no C/C/E, distal pulses intact and equal.  Bilateral groin site(s) clear Skin: warm/dry no rash  Telemetry shows AV sequential pacing with occasional PVCs.  Echo images are reviewed and showed normal LV function, normal TAVR valve function with a mean gradient of 12 mmHg and no paravalvular regurgitation.  Formal echo interpretation is currently pending.  The patient appears to have mild mitral regurgitation and at least moderate tricuspid regurgitation by my review of the echo images.  The patient has done very well with TAVR.  His groin sites are clear.  His heart rhythm is stable with permanent  pacing.  He is medically stable for discharge today.  MD time spent conducting this discharge is 25 minutes.  This includes my personal examination of the patient, review of vital signs, labs, echo findings, personal review of echo images, radiographic images, and discussion of post Hospital restrictions/recovery as well as recommendations for outpatient follow-up.  Ozell Fell 02/12/2024 12:11 PM

## 2024-02-11 NOTE — Anesthesia Preprocedure Evaluation (Addendum)
 Anesthesia Evaluation  Patient identified by MRN, date of birth, ID band Patient awake    Reviewed: Allergy & Precautions, H&P , NPO status , Patient's Chart, lab work & pertinent test results  History of Anesthesia Complications Negative for: history of anesthetic complications  Airway Mallampati: II  TM Distance: >3 FB Neck ROM: Full    Dental no notable dental hx.    Pulmonary neg recent URI, PE   Pulmonary exam normal breath sounds clear to auscultation       Cardiovascular hypertension, + CAD, + Past MI, + Cardiac Stents, +CHF and + DVT  Normal cardiovascular exam+ dysrhythmias Atrial Fibrillation + pacemaker + Valvular Problems/Murmurs AS  Rhythm:Regular Rate:Normal  TTE: IMPRESSIONS     1. Left ventricular ejection fraction, by estimation, is 60 to 65%. The  left ventricle has normal function. The left ventricle has no regional  wall motion abnormalities. There is mild concentric left ventricular  hypertrophy. Left ventricular diastolic  parameters are indeterminate.   2. Right ventricular systolic function is normal. The right ventricular  size is normal. There is normal pulmonary artery systolic pressure. The  estimated right ventricular systolic pressure is 35.0 mmHg.   3. Left atrial size was moderately dilated.   4. The mitral valve is degenerative. Mild to moderate mitral valve  regurgitation. No evidence of mitral stenosis. Moderate mitral annular  calcification.   5. Tricuspid valve regurgitation is moderate.   6. The aortic valve has an indeterminant number of cusps. There is severe  calcifcation of the aortic valve. There is moderate thickening of the  aortic valve. Aortic valve regurgitation is mild. Severe aortic valve  stenosis. Aortic valve mean gradient  measures 48.0 mmHg. Aortic valve Vmax measures 4.28 m/s. Aortic valve  acceleration time measures 116 msec.   7. The inferior vena cava is normal in  size with greater than 50%  respiratory variability, suggesting right atrial pressure of 3 mmHg.     Neuro/Psych neg Seizures negative neurological ROS  negative psych ROS   GI/Hepatic negative GI ROS, Neg liver ROS,,,  Endo/Other  negative endocrine ROS    Renal/GU Renal disease  negative genitourinary   Musculoskeletal  (+) Arthritis ,    Abdominal   Peds negative pediatric ROS (+)  Hematology negative hematology ROS (+)   Anesthesia Other Findings   Reproductive/Obstetrics negative OB ROS                             Anesthesia Physical Anesthesia Plan  ASA: 4  Anesthesia Plan: MAC   Post-op Pain Management:    Induction: Intravenous  PONV Risk Score and Plan: 1 and Propofol  infusion and Treatment may vary due to age or medical condition  Airway Management Planned: Natural Airway  Additional Equipment:   Intra-op Plan:   Post-operative Plan:   Informed Consent: I have reviewed the patients History and Physical, chart, labs and discussed the procedure including the risks, benefits and alternatives for the proposed anesthesia with the patient or authorized representative who has indicated his/her understanding and acceptance.     Dental advisory given  Plan Discussed with: CRNA  Anesthesia Plan Comments:         Anesthesia Quick Evaluation

## 2024-02-11 NOTE — Progress Notes (Signed)
 Pt arrived from ...cath.., A/ox 4...pt denies any pain, MD aware,CCMD called. CHG bath given,no further needs at this time

## 2024-02-11 NOTE — Op Note (Addendum)
 HEART AND VASCULAR CENTER   MULTIDISCIPLINARY HEART VALVE TEAM   TAVR OPERATIVE NOTE   Date of Procedure:  02/11/2024  Preoperative Diagnosis: Severe Aortic Stenosis   Postoperative Diagnosis: Same   Procedure:   Transcatheter Aortic Valve Replacement - Percutaneous  Transfemoral Approach  Edwards Sapien 3 Ultra Resilia THV (size 29 mm, serial # [id number])   Co-Surgeons:  Dorise Fellers, MD and Ozell Fell, MD  Anesthesiologist:  Norleen Peoples, MD  Echocardiographer:  Jerel Balding, MD  Pre-operative Echo Findings: Severe aortic stenosis Normal left ventricular systolic function  Post-operative Echo Findings: Trace paravalvular leak Normal left ventricular systolic function  BRIEF CLINICAL NOTE AND INDICATIONS FOR SURGERY 89 year old gentleman with severe, symptomatic aortic stenosis, permanent pacemaker, and coronary artery disease with history of RCA and LAD stenting.  The patient is undergone multimodality imaging testing and multidisciplinary heart team review of his case.  He is referred for TAVR via a transfemoral approach.  During the course of the patient's preoperative work up they have been evaluated comprehensively by a multidisciplinary team of specialists coordinated through the Multidisciplinary Heart Valve Clinic in the [facility].  They have been demonstrated to suffer from symptomatic severe aortic stenosis as noted above. The patient has been counseled extensively as to the relative risks and benefits of all options for the treatment of severe aortic stenosis including long term medical therapy, conventional surgery for aortic valve replacement, and transcatheter aortic valve replacement.  The patient has been independently evaluated in formal cardiac surgical consultation by Dr Fellers, who deemed the patient appropriate for TAVR. Based upon review of all of the patient's preoperative diagnostic tests they are felt to be candidate for  transcatheter aortic valve replacement using the transfemoral approach as an alternative to conventional surgery.    Following the decision to proceed with transcatheter aortic valve replacement, a discussion has been held regarding what types of management strategies would be attempted intraoperatively in the event of life-threatening complications, including whether or not the patient would be considered a candidate for the use of cardiopulmonary bypass and/or conversion to open sternotomy for attempted surgical intervention.  The patient has been advised of a variety of complications that might develop peculiar to this approach including but not limited to risks of death, stroke, paravalvular leak, aortic dissection or other major vascular complications, aortic annulus rupture, device embolization, cardiac rupture or perforation, acute myocardial infarction, arrhythmia, heart block or bradycardia requiring permanent pacemaker placement, congestive heart failure, respiratory failure, renal failure, pneumonia, infection, other late complications related to structural valve deterioration or migration, or other complications that might ultimately cause a temporary or permanent loss of functional independence or other long term morbidity.  The patient provides full informed consent for the procedure as described and all questions were answered preoperatively.  DETAILS OF THE OPERATIVE PROCEDURE  PREPARATION:   The patient is brought to the operating room on the above mentioned date. The patient is placed in the supine position on the operating table.  Intravenous antibiotics are administered. The patient is monitored closely throughout the procedure under conscious sedation.    Baseline transthoracic echocardiogram is performed. The patient's chest, abdomen, both groins, and both lower extremities are prepared and draped in a sterile manner. A time out procedure is performed.   PERIPHERAL ACCESS:   Using  ultrasound guidance, femoral arterial access is obtained with placement of a 6 Fr sheath on the left side.  US  images are digitally captured and stored in the patient's  chart. A pigtail diagnostic catheter was passed through the femoral arterial sheath under fluoroscopic guidance into the aortic root.  Aortic root angiography was performed in order to determine the optimal angiographic angle for valve deployment.  TRANSFEMORAL ACCESS:  A micropuncture technique is used to access the right femoral artery under fluoroscopic and ultrasound guidance.  2 Perclose devices are deployed at 10' and 2' positions to 'PreClose' the femoral artery. An 8 French sheath is placed and then an Amplatz Superstiff wire is advanced through the sheath. This is changed out for a 16 French transfemoral E-Sheath after progressively dilating over the Superstiff wire.  An AL-2 catheter was used to direct a straight-tip exchange length wire across the native aortic valve into the left ventricle. This was exchanged out for a pigtail catheter and position was confirmed in the LV apex. Simultaneous LV and Ao pressures were recorded.  The pigtail catheter was exchanged for a Safari wire in the LV apex.  Once the safari wire is in place in the LV apex, we decided to pace the ventricle off of the wire.  There is appropriate capture.  BALLOON AORTIC VALVULOPLASTY:  Not performed  TRANSCATHETER HEART VALVE DEPLOYMENT:  An Edwards Sapien 3 Ultra Resilia transcatheter heart valve (size 29 mm) was prepared and crimped per manufacturer's guidelines, and the proper orientation of the valve is confirmed on the [employer] delivery system. The valve was advanced through the introducer sheath using normal technique until in an appropriate position in the abdominal aorta beyond the sheath tip. The balloon was then retracted and using the fine-tuning wheel was centered on the valve. The valve was then advanced across the aortic arch using  appropriate flexion of the catheter. The valve was carefully positioned across the aortic valve annulus. The Commander catheter was retracted using normal technique. Once final position of the valve has been confirmed by angiographic assessment, the valve is deployed while temporarily holding ventilation and during rapid ventricular pacing to maintain systolic blood pressure < 50 mmHg and pulse pressure < 10 mmHg. The balloon inflation is held for >3 seconds after reaching full deployment volume. Once the balloon has fully deflated the balloon is retracted into the ascending aorta and valve function is assessed using echocardiography. The patient's hemodynamic recovery following valve deployment is good.  The deployment balloon and guidewire are both removed. Echo demostrated acceptable post-procedural gradients, stable mitral valve function, and trace aortic insufficiency.    PROCEDURE COMPLETION:  The sheath was removed and femoral artery closure is performed using the 2 previously deployed Perclose devices.  Protamine is administered once femoral arterial repair was complete. The site is clear with no evidence of bleeding or hematoma after the sutures are tightened. The pigtail catheters are removed. Mynx closure is used for contralateral femoral arterial hemostasis for the 6 Fr sheath.  The patient tolerated the procedure well and is transported to the recovery area in stable condition. There were no immediate intraoperative complications. All sponge instrument and needle counts are verified correct at completion of the operation.   The patient received a total of 50 mL of intravenous contrast during the procedure.  EBL: minimal  LVEDP: 14 mmHg   Ozell Fell, MD 02/11/2024 2:36 PM

## 2024-02-11 NOTE — Anesthesia Postprocedure Evaluation (Signed)
 Anesthesia Post Note  Patient: [name]  Procedure(s) Performed: Transcatheter Aortic Valve Replacement, Transfemoral ECHOCARDIOGRAM, TRANSTHORACIC     Patient location during evaluation: PACU Anesthesia Type: MAC Level of consciousness: awake and alert Pain management: pain level controlled Vital Signs Assessment: post-procedure vital signs reviewed and stable Respiratory status: spontaneous breathing, nonlabored ventilation, respiratory function stable and patient connected to nasal cannula oxygen  Cardiovascular status: stable and blood pressure returned to baseline Postop Assessment: no apparent nausea or vomiting Anesthetic complications: no   There were no known notable events for this encounter.  Last Vitals:  Vitals:   02/11/24 1545 02/11/24 1600  BP: 109/70 104/70  Pulse: 60 61  Resp: 20 19  Temp:    SpO2: 90% 91%    Last Pain:  Vitals:   02/11/24 1411  TempSrc: Axillary  PainSc:                  Thom JONELLE Peoples

## 2024-02-12 ENCOUNTER — Inpatient Hospital Stay (HOSPITAL_COMMUNITY)

## 2024-02-12 ENCOUNTER — Other Ambulatory Visit: Payer: Self-pay | Admitting: Physician Assistant

## 2024-02-12 DIAGNOSIS — Z952 Presence of prosthetic heart valve: Secondary | ICD-10-CM

## 2024-02-12 DIAGNOSIS — I130 Hypertensive heart and chronic kidney disease with heart failure and stage 1 through stage 4 chronic kidney disease, or unspecified chronic kidney disease: Secondary | ICD-10-CM | POA: Diagnosis not present

## 2024-02-12 DIAGNOSIS — I350 Nonrheumatic aortic (valve) stenosis: Secondary | ICD-10-CM | POA: Diagnosis not present

## 2024-02-12 DIAGNOSIS — I495 Sick sinus syndrome: Secondary | ICD-10-CM | POA: Diagnosis not present

## 2024-02-12 DIAGNOSIS — I272 Pulmonary hypertension, unspecified: Secondary | ICD-10-CM | POA: Diagnosis not present

## 2024-02-12 LAB — BASIC METABOLIC PANEL WITH GFR
Anion gap: 10 (ref 5–15)
BUN: 32 mg/dL — ABNORMAL HIGH (ref 8–23)
CO2: 23 mmol/L (ref 22–32)
Calcium: 9.1 mg/dL (ref 8.9–10.3)
Chloride: 103 mmol/L (ref 98–111)
Creatinine, Ser: 1.67 mg/dL — ABNORMAL HIGH (ref 0.61–1.24)
GFR, Estimated: 39 mL/min — ABNORMAL LOW (ref >60)
Glucose, Bld: 103 mg/dL — ABNORMAL HIGH (ref 70–99)
Potassium: 4.1 mmol/L (ref 3.5–5.1)
Sodium: 136 mmol/L (ref 135–145)

## 2024-02-12 LAB — CBC
HCT: 42.3 % (ref 39.0–52.0)
Hemoglobin: 14.2 g/dL (ref 13.0–17.0)
MCH: 31.3 pg (ref 26.0–34.0)
MCHC: 33.6 g/dL (ref 30.0–36.0)
MCV: 93.2 fL (ref 80.0–100.0)
Platelets: 113 10*3/uL — ABNORMAL LOW (ref 150–400)
RBC: 4.54 MIL/uL (ref 4.22–5.81)
RDW: 13.9 % (ref 11.5–15.5)
WBC: 9.2 10*3/uL (ref 4.0–10.5)
nRBC: 0 % (ref 0.0–0.2)

## 2024-02-12 LAB — ECHOCARDIOGRAM COMPLETE
AR max vel: 1.38 cm2
AV Area VTI: 1.76 cm2
AV Area mean vel: 1.16 cm2
AV Mean grad: 12 mmHg
AV Peak grad: 23.2 mmHg
Ao pk vel: 2.41 m/s
Area-P 1/2: 3.5 cm2
Calc EF: 68.7 %
Height: 75 in
S' Lateral: 3.8 cm
Single Plane A2C EF: 60.8 %
Single Plane A4C EF: 75.8 %
Weight: 3661.4 [oz_av]

## 2024-02-12 LAB — MAGNESIUM: Magnesium: 2.1 mg/dL (ref 1.7–2.4)

## 2024-02-12 NOTE — Progress Notes (Signed)
*  PRELIMINARY RESULTS* Echocardiogram 2D Echocardiogram has been performed.  Eva JONETTA Lash 02/12/2024, 10:10 AM

## 2024-02-12 NOTE — Progress Notes (Signed)
   02/12/24 1141  TOC Brief Assessment  Insurance and Status Reviewed  Patient has primary care physician Yes  Home environment has been reviewed home w/ spouse  Prior level of function: independent  Prior/Current Home Services No current home services  Social Drivers of Health Review SDOH reviewed no interventions necessary  Readmission risk has been reviewed Yes  Transition of care needs no transition of care needs at this time    Pt stable for transition home today s/p TAVR. Wife to transport home. No HH or DME needs noted.

## 2024-02-12 NOTE — Progress Notes (Signed)
 Discussed with pt and wife restrictions, exercise, and CRPII. Pt receptive. Not interested in CRPII, will walk on his own.  Aliene Aris BS, ACSM-CEP 02/12/2024 10:41 AM [phone]

## 2024-02-13 ENCOUNTER — Telehealth: Payer: Self-pay | Admitting: *Deleted

## 2024-02-13 ENCOUNTER — Telehealth: Payer: Self-pay | Admitting: Physician Assistant

## 2024-02-13 NOTE — Transitions of Care (Post Inpatient/ED Visit) (Signed)
   02/13/2024  Name: [name] MRN: [id number] DOB: 1934/07/27  Today's TOC FU Call Status: Today's TOC FU Call Status:: Unsuccessful Call (1st Attempt) Unsuccessful Call (1st Attempt) Date: 02/13/24  Attempted to reach the patient regarding the most recent Inpatient/ED visit.  Follow Up Plan: Additional outreach attempts will be made to reach the patient to complete the Transitions of Care (Post Inpatient/ED visit) call.   Mliss Creed [facility], BSN RN Care Manager/ Transition of Care [place]/ [facility] [phone]

## 2024-02-13 NOTE — Telephone Encounter (Signed)
  HEART AND VASCULAR CENTER   MULTIDISCIPLINARY HEART VALVE TEAM   Patient contacted regarding discharge from [facility] on 02/12/24  Patient understands to follow up with a structural heart provider on 02/24/24 at [address] [facility]. Patient understands discharge instructions? yes Patient understands medications and regimen? yes Patient understands to bring all medications to this visit? yes  Lamarr Hummer PA-C  [facility]

## 2024-02-17 ENCOUNTER — Other Ambulatory Visit: Payer: Self-pay | Admitting: Family Medicine

## 2024-02-18 ENCOUNTER — Telehealth: Payer: Self-pay | Admitting: *Deleted

## 2024-02-18 NOTE — Transitions of Care (Post Inpatient/ED Visit) (Signed)
   02/18/2024  Name: [name] MRN: [id number] DOB: 03/06/1934  Today's TOC FU Call Status: Today's TOC FU Call Status:: Unsuccessful Call (2nd Attempt) Unsuccessful Call (2nd Attempt) Date: 02/18/24  Attempted to reach the patient regarding the most recent Inpatient/ED visit.  Follow Up Plan: Additional outreach attempts will be made to reach the patient to complete the Transitions of Care (Post Inpatient/ED visit) call.   Mliss Creed [facility], BSN RN Care Manager/ Transition of Care [place]/ [facility] [phone]

## 2024-02-19 ENCOUNTER — Telehealth: Payer: Self-pay | Admitting: *Deleted

## 2024-02-19 NOTE — Transitions of Care (Post Inpatient/ED Visit) (Signed)
   02/19/2024  Name: [name] MRN: [id number] DOB: 06/29/1934  Today's TOC FU Call Status: Today's TOC FU Call Status:: Unsuccessful Call (3rd Attempt) Unsuccessful Call (3rd Attempt) Date: 02/19/24  Attempted to reach the patient regarding the most recent Inpatient/ED visit.  Follow Up Plan: No further outreach attempts will be made at this time. We have been unable to contact the patient.  Mliss Creed [facility], BSN RN Care Manager/ Transition of Care [place]/ [facility] [phone]

## 2024-02-23 NOTE — Progress Notes (Unsigned)
 HEART AND VASCULAR CENTER   MULTIDISCIPLINARY HEART VALVE CLINIC                                     Cardiology Office Note:    Date:  02/24/2024   ID:  Alm VEAR Lesches, DOB 10-10-1933, MRN [id number]  PCP:  Katrinka Garnette KIDD, MD  [facility] Cardiologist:  Oneil Parchment, MD  [facility] Structural heart: Ozell Fell, MD [facility] Electrophysiologist:  Will Gladis Norton, MD   Referring MD: Katrinka Garnette KIDD, MD   [facility] s/p TAVR  History of Present Illness:    [name] is a 89 y.o. 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   The patient reported worsening exertional shortness of breath and fatigue. Echo 12/03/2023 showed EF 60%, mild LVH, severe AS with mean grad 48 mmHg, AVA 0.56 cm2, mild AI, moderate MAC with mild-mod MR & mod TR. [facility] 12/20/23 showed a patent LAD stent with 70 to 80% in-stent restenosis in the mid RCA. Plan to treat medically given lack of angina. S/p successful TAVR with a 29 mm Edwards Sapien 3 Ultra Resilia THV via the TF approach on 02/11/24. Post operative echo showed EF 60%, normally functioning TAVR with a mean gradient of 12 mmHg and no PVL as well as mod MAC with mild MR and mod pulm HTN.  Today the patient presents to clinic for follow up. Here alone and doing great with an increase in energy and exercise tolerance. No CP or SOB. No LE edema, orthopnea or PND. No dizziness or syncope. No blood in stool or urine. No palpitations.  Groin sites are healing well.    Past Medical History:  Diagnosis Date   Arthritis    back, knees (11/21/2016)   Bladder stones    CAD (coronary artery disease)    11/20/16 PCI with DES--> LAD/RCA   Chronic lower back pain    CKD (chronic kidney  disease), stage IV (HCC)    thelbert 11/08/2016   Dilatation of aorta (HCC)    Elevated hemidiaphragm    History of gout    History of kidney stones 12/02/2008   History of pulmonary embolism    Hyperlipidemia    Hypertension    NSVT (nonsustained ventricular tachycardia) (HCC)    Pacemaker    PAF (paroxysmal atrial fibrillation) (HCC)    Pulmonary hypertension (HCC)    Recurrent deep venous thrombosis (HCC)    S/P TAVR (transcatheter aortic valve replacement) 02/11/2024   s/p TAVR with a 29 mm Edwards S3UR via the TF approach by Dr. Fell and Dr. Lucas     Current Medications: Current Meds  Medication Sig   azithromycin  (ZITHROMAX ) 500 MG tablet Take 1 tablet (500 mg total) by mouth as directed. Take one tablet 1 hour before any dental work including cleanings.      ROS:   Please see the history of present illness.    All other systems reviewed and are negative.  EKGs   EKG Interpretation Date/Time:  Monday February 24 2024 08:54:39 EDT Ventricular Rate:  60 PR Interval:  282 QRS Duration:  170 QT Interval:  434 QTC Calculation: 434 R Axis:   -57  Text Interpretation: Atrial-paced rhythm with prolonged AV conduction Right bundle branch block Left anterior fascicular block Bifascicular block Left ventricular hypertrophy with repolarization abnormality ( R in aVL , Romhilt-Estes ) Confirmed by Sebastian Collar [phone]) on 02/24/2024 9:02:50 AM   Risk Assessment/Calculations:    CHA2DS2-VASc Score = 5   This indicates a 7.2% annual risk of stroke. The patient's score is based upon: CHF History: 1 HTN History: 1 Diabetes History: 0 Stroke History: 0 Vascular Disease History: 1 Age Score: 2 Gender Score: 0          Physical Exam:    VS:  BP (!) 112/58   Ht 6' 3 (1.905 m)   Wt 228 lb 9.6 oz (103.7 kg)   BMI 28.57 kg/m     Wt Readings from Last 3 Encounters:  02/24/24 228 lb 9.6 oz (103.7 kg)  02/12/24 228 lb 13.4 oz (103.8 kg)  02/05/24 232 lb (105.2 kg)      GEN: Well nourished, well developed in no acute distress NECK: No JVD CARDIAC: RRR, no murmurs, rubs, gallops RESPIRATORY:  Clear to auscultation without rales, wheezing or rhonchi  ABDOMEN: Soft, non-tender, non-distended EXTREMITIES:  No edema; No deformity.  Pt self reported good healing of groins with mild bruising but preferred no exam.  ASSESSMENT:    1. S/P TAVR (transcatheter aortic valve replacement)   2. CAD in native artery   3. Paroxysmal atrial fibrillation (HCC)   4. Stage 3b chronic kidney disease (HCC)   5. Chronic heart failure with preserved ejection fraction (HCC)   6. Primary hypertension   7. Cardiac pacemaker in situ     PLAN:    In order of problems listed above:  Severe AS s/p TAVR:  -- Pt doing excellent s/p TAVR.  -- Groin sites healing well.  -- SBE prophylaxis discussed; I have RX'd azithromycin  due to a PCN allergy.   -- Continue Eliquis  2.5mg  BID.  -- Cleared to resume all activities without restriction. -- I will see back for 1 month echo and OV.  CAD: -- [facility] 12/20/23 showed a patent LAD stent with 70 to 80% in-stent restenosis in the mid RCA.  -- Plan to treat medically given lack of angina.  -- No aspirin  given OAC.  -- Continue atorvastatin  40mg  daily.     PAF: -- Continue Eliquis  2.5mg  BID. -- Continue Toprol  XL 50mg  daily.  -- Amiodarone  previously discontinued due to prolonged QT syndrome.    CKD stage IIIb: -- Creat baseline 1.47-1.98. -- Creat 1.67 on the day of discharge.    HFpEF: -- Appears euvolemic.  -- Change Lasix  from 20mg  daily (listed as BID) to PRN for weight gain or LE edema.    HTN: -- BP well controlled.  -- Continue Toprol  XL 50mg  daily. -- As above, stop scheduled Lasix .    SSS s/p PPM: -- Followed by Dr. Inocencio.    Medication Adjustments/Labs and Tests Ordered: Current medicines are reviewed at length with the patient today.  Concerns regarding medicines are outlined above.  Orders Placed This  Encounter  Procedures   EKG 12-Lead   Meds ordered this encounter  Medications   azithromycin  (ZITHROMAX ) 500 MG tablet    Sig: Take 1 tablet (500 mg total) by mouth as directed. Take one tablet 1 hour before any dental work including cleanings.    Dispense:  6 tablet    Refill:  12    Supervising Provider:  COOPER, MICHAEL [[id number]]   furosemide  (LASIX ) 20 MG tablet    Sig: Take 1 tablet (20 mg total) by mouth as needed.    Patient Instructions  Medication Instructions:  Your physician has recommended you make the following change in your medication: Change Lasix  to taking as needed for weight gain or lower extremity swelling. Start Azithromycin  500 mg, take 1 tablet by mouth 1 hour prior to dental procedures and cleanings.   *If you need a refill on your cardiac medications before your next appointment, please call your pharmacy*  Lab Work: None needed If you have labs (blood work) drawn today and your tests are completely normal, you will receive your results only by: MyChart Message (if you have MyChart) OR A paper copy in the mail If you have any lab test that is abnormal or we need to change your treatment, we will call you to review the results.  Testing/Procedures: Your physician has requested that you have an echocardiogram. Echocardiography is a painless test that uses sound waves to create images of your heart. It provides your doctor with information about the size and shape of your heart and how well your heart's chambers and valves are working. This procedure takes approximately one hour. There are no restrictions for this procedure. Please do NOT wear cologne, perfume, aftershave, or lotions (deodorant is allowed). Please arrive 15 minutes prior to your appointment time.  Please note: We ask at that you not bring children with you during ultrasound (echo/ vascular) testing. Due to room size and safety concerns, children are not allowed in the ultrasound rooms during  exams. Our front office staff cannot provide observation of children in our lobby area while testing is being conducted. An adult accompanying a patient to their appointment will only be allowed in the ultrasound room at the discretion of the ultrasound technician under special circumstances. We apologize for any inconvenience.   Follow-Up: At [facility], you and your health needs are our priority.  As part of our continuing mission to provide you with exceptional heart care, our providers are all part of one team.  This team includes your primary Cardiologist (physician) and Advanced Practice Providers or APPs (Physician Assistants and Nurse Practitioners) who all work together to provide you with the care you need, when you need it.  Your next appointment:   As scheduled   Provider:   Izetta Hummer, PA-C    We recommend signing up for the patient portal called MyChart.  Sign up information is provided on this After Visit Summary.  MyChart is used to connect with patients for Virtual Visits (Telemedicine).  Patients are able to view lab/test results, encounter notes, upcoming appointments, etc.  Non-urgent messages can be sent to your provider as well.   To learn more about what you can do with MyChart, go to [email or web address].        Signed, Lamarr Hummer, PA-C  02/24/2024 9:36 AM    [facility]

## 2024-02-24 ENCOUNTER — Ambulatory Visit: Attending: Physician Assistant | Admitting: Physician Assistant

## 2024-02-24 VITALS — BP 112/58 | Ht 75.0 in | Wt 228.6 lb

## 2024-02-24 DIAGNOSIS — Z952 Presence of prosthetic heart valve: Secondary | ICD-10-CM

## 2024-02-24 DIAGNOSIS — I5032 Chronic diastolic (congestive) heart failure: Secondary | ICD-10-CM | POA: Diagnosis present

## 2024-02-24 DIAGNOSIS — I2510 Atherosclerotic heart disease of native coronary artery without angina pectoris: Secondary | ICD-10-CM

## 2024-02-24 DIAGNOSIS — Z950 Presence of cardiac pacemaker: Secondary | ICD-10-CM | POA: Diagnosis present

## 2024-02-24 DIAGNOSIS — N1832 Chronic kidney disease, stage 3b: Secondary | ICD-10-CM

## 2024-02-24 DIAGNOSIS — I10 Essential (primary) hypertension: Secondary | ICD-10-CM | POA: Diagnosis present

## 2024-02-24 DIAGNOSIS — I480 Paroxysmal atrial fibrillation: Secondary | ICD-10-CM

## 2024-02-24 MED ORDER — FUROSEMIDE 20 MG PO TABS: 20.0000 mg | ORAL_TABLET | ORAL | Status: DC | PRN

## 2024-02-24 MED ORDER — AZITHROMYCIN 500 MG PO TABS: 500.0000 mg | ORAL_TABLET | ORAL | 12 refills | Status: DC

## 2024-02-24 NOTE — Patient Instructions (Signed)
 Medication Instructions:  Your physician has recommended you make the following change in your medication: Change Lasix  to taking as needed for weight gain or lower extremity swelling. Start Azithromycin  500 mg, take 1 tablet by mouth 1 hour prior to dental procedures and cleanings.   *If you need a refill on your cardiac medications before your next appointment, please call your pharmacy*  Lab Work: None needed If you have labs (blood work) drawn today and your tests are completely normal, you will receive your results only by: MyChart Message (if you have MyChart) OR A paper copy in the mail If you have any lab test that is abnormal or we need to change your treatment, we will call you to review the results.  Testing/Procedures: Your physician has requested that you have an echocardiogram. Echocardiography is a painless test that uses sound waves to create images of your heart. It provides your doctor with information about the size and shape of your heart and how well your heart's chambers and valves are working. This procedure takes approximately one hour. There are no restrictions for this procedure. Please do NOT wear cologne, perfume, aftershave, or lotions (deodorant is allowed). Please arrive 15 minutes prior to your appointment time.  Please note: We ask at that you not bring children with you during ultrasound (echo/ vascular) testing. Due to room size and safety concerns, children are not allowed in the ultrasound rooms during exams. Our front office staff cannot provide observation of children in our lobby area while testing is being conducted. An adult accompanying a patient to their appointment will only be allowed in the ultrasound room at the discretion of the ultrasound technician under special circumstances. We apologize for any inconvenience.   Follow-Up: At [facility], you and your health needs are our priority.  As part of our continuing mission to provide you  with exceptional heart care, our providers are all part of one team.  This team includes your primary Cardiologist (physician) and Advanced Practice Providers or APPs (Physician Assistants and Nurse Practitioners) who all work together to provide you with the care you need, when you need it.  Your next appointment:   As scheduled   Provider:   Izetta Hummer, PA-C    We recommend signing up for the patient portal called MyChart.  Sign up information is provided on this After Visit Summary.  MyChart is used to connect with patients for Virtual Visits (Telemedicine).  Patients are able to view lab/test results, encounter notes, upcoming appointments, etc.  Non-urgent messages can be sent to your provider as well.   To learn more about what you can do with MyChart, go to [email or web address].

## 2024-03-03 ENCOUNTER — Other Ambulatory Visit: Payer: Self-pay | Admitting: Cardiology

## 2024-03-03 DIAGNOSIS — I2782 Chronic pulmonary embolism: Secondary | ICD-10-CM

## 2024-03-03 NOTE — Telephone Encounter (Signed)
 Prescription refill request for Eliquis  received. Indication:afib Last office visit:7/25 Scr:1.67  7/25 Age: 89 Weight:103.7  kg  Prescription refilled

## 2024-03-10 NOTE — Progress Notes (Signed)
 Remote pacemaker transmission.

## 2024-03-23 ENCOUNTER — Encounter (HOSPITAL_COMMUNITY): Payer: Self-pay | Admitting: Surgery

## 2024-03-25 NOTE — Progress Notes (Unsigned)
 HEART AND VASCULAR CENTER   MULTIDISCIPLINARY HEART VALVE CLINIC                                     Cardiology Office Note:    Date:  03/30/2024   ID:  [name], DOB [birth year], MRN [id number]  PCP:  Katrinka Garnette KIDD, MD  [facility] Cardiologist:  Oneil Parchment, MD  [facility] Structural heart: Ozell Fell, MD [facility] Electrophysiologist:  Will Gladis Norton, MD   Referring MD: Katrinka Garnette KIDD, MD   1 month s/p TAVR  History of Present Illness:    [name] is a 89 y.o. 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    The patient reported worsening exertional shortness of breath and fatigue. Echo 12/03/2023 showed EF 60%, mild LVH, severe AS with mean grad 48 mmHg, AVA 0.56 cm2, mild AI, moderate MAC with mild-mod MR & mod TR. [facility] 12/20/23 showed a patent LAD stent with 70 to 80% in-stent restenosis in the mid RCA. Plan to treat medically given lack of angina. S/p successful TAVR with a 29 mm Edwards Sapien 3 Ultra Resilia THV via the TF approach on 02/11/24. Post operative echo showed EF 60%, normally functioning TAVR with a mean gradient of 12 mmHg and no PVL as well as mod MAC with mild MR and mod pulm HTN. He reported doing markedly well after his surgery. Lasix  was changed to as needed.    Today the patient presents to clinic for follow up. Here alone. Doing well off lasix . Has chronic mild LLE edema 2/2 parachute injury. He has no CP. Mild SOB with walking up stairs. No longer playing golf or running marathons. No orthopnea or PND. No dizziness or syncope. No blood in stool or urine. No palpitations.    Past Medical History:  Diagnosis Date   Arthritis    back, knees (11/21/2016)    Bladder stones    CAD (coronary artery disease)    11/20/16 PCI with DES--> LAD/RCA   Chronic lower back pain    CKD (chronic kidney disease), stage IV (HCC)    thelbert 11/08/2016   Dilatation of aorta (HCC)    Elevated hemidiaphragm    History of gout    History of kidney stones 12/02/2008   History of pulmonary embolism    Hyperlipidemia    Hypertension    NSVT (nonsustained ventricular tachycardia) (HCC)    Pacemaker    PAF (paroxysmal atrial fibrillation) (HCC)    Pulmonary hypertension (HCC)    Recurrent deep venous thrombosis (HCC)    S/P TAVR (transcatheter aortic valve replacement) 02/11/2024   s/p TAVR with a 29 mm Edwards S3UR via the TF approach by Dr. Fell and Dr. Lucas     Current Medications: Current Meds  Medication Sig   allopurinol  (ZYLOPRIM ) 300 MG tablet TAKE 1 TABLET DAILY   atorvastatin  (LIPITOR) 40 MG tablet Take 1 tablet (40 mg total) by mouth at bedtime.   diphenhydramine -acetaminophen  (TYLENOL  PM) 25-500 MG TABS tablet Take 1 tablet by mouth at bedtime as needed (sleep).   ELIQUIS  2.5 MG TABS tablet TAKE 1 TABLET TWICE  A DAY   finasteride  (PROSCAR ) 5 MG tablet Take 5 mg by mouth daily.   metoprolol  succinate (TOPROL  XL) 25 MG 24 hr tablet Take 1 tablet (25 mg total) by mouth daily.   nitroGLYCERIN  (NITROSTAT ) 0.4 MG SL tablet Place 1 tablet (0.4 mg total) under the tongue every 5 (five) minutes as needed for chest pain.   Tamsulosin  HCl (FLOMAX ) 0.4 MG CAPS Take 0.8 mg by mouth at bedtime.   [DISCONTINUED] metoprolol  succinate (TOPROL -XL) 50 MG 24 hr tablet Take 1 tablet (50 mg total) by mouth daily. Take with or immediately following a meal.   [DISCONTINUED] metoprolol  tartrate (LOPRESSOR ) 25 MG tablet Take 1 tablet (25 mg total) by mouth daily.      ROS:   Please see the history of present illness.    All other systems reviewed and are negative.  EKGs       Risk Assessment/Calculations:    CHA2DS2-VASc Score = 5   This indicates a 7.2%  annual risk of stroke. The patient's score is based upon: CHF History: 1 HTN History: 1 Diabetes History: 0 Stroke History: 0 Vascular Disease History: 1 Age Score: 2 Gender Score: 0          Physical Exam:    VS:  BP (!) 102/52   Pulse 65   Ht 6' 3 (1.905 m)   Wt 235 lb 6.4 oz (106.8 kg)   SpO2 95%   BMI 29.42 kg/m     Wt Readings from Last 3 Encounters:  03/30/24 235 lb 6.4 oz (106.8 kg)  02/24/24 228 lb 9.6 oz (103.7 kg)  02/12/24 228 lb 13.4 oz (103.8 kg)     GEN: Well nourished, well developed in no acute distress NECK: No JVD CARDIAC: RRR, no murmurs, rubs, gallops RESPIRATORY:  Clear to auscultation without rales, wheezing or rhonchi  ABDOMEN: Soft, non-tender, non-distended EXTREMITIES: chronic 1+ LLE edema from a parachute injury; No deformity.    ASSESSMENT:    1. S/P TAVR (transcatheter aortic valve replacement)   2. CAD in native artery   3. Paroxysmal atrial fibrillation (HCC)   4. Stage 3b chronic kidney disease (HCC)   5. Chronic heart failure with preserved ejection fraction (HCC)   6. Primary hypertension   7. Cardiac pacemaker in situ     PLAN:    In order of problems listed above:  Severe AS s/p TAVR:  -- Echo today shows EF 60%, mod concentric LVH, mild RVE, moderate MAC with mild MR, mod TR, normally functioning TAVR with a mean gradient of 9 mmHg and no PVL. -- NYHA class II symptoms with a marked symptomatic improvement since TAVR.  -- SBE prophylaxis discussed; he has azithromycin  due to a PCN allergy.   -- Continue Eliquis  2.5mg  BID.  -- I will see back for 1 year echo and OV.   CAD: -- [facility] 12/20/23 showed a patent LAD stent with 70 to 80% in-stent restenosis in the mid RCA.  -- Plan to treat medically given lack of angina.  -- No aspirin  given OAC.  -- Continue atorvastatin  40mg  daily.     PAF: -- Continue Eliquis  2.5mg  BID. -- Decrease Toprol  XL 50mg  daily to 25mg  daily given soft BPs. -- Amiodarone  previously  discontinued due to prolonged QT syndrome.    CKD stage IIIb: -- Creat baseline 1.47-1.98.   HFpEF: -- Appears euvolemic.  -- No longer taking any Lasix .    HTN: -- BP soft today.  -- Decrease Toprol  XL 50mg  daily  to 25mg  daily given soft BPs and HR in 50-60s.    SSS s/p PPM: -- Followed by Dr. Inocencio.   Medication Adjustments/Labs and Tests Ordered: Current medicines are reviewed at length with the patient today.  Concerns regarding medicines are outlined above.  Orders Placed This Encounter  Procedures   ECHOCARDIOGRAM COMPLETE   Meds ordered this encounter  Medications   DISCONTD: metoprolol  tartrate (LOPRESSOR ) 25 MG tablet    Sig: Take 1 tablet (25 mg total) by mouth daily.    Dispense:  90 tablet    Refill:  3   metoprolol  succinate (TOPROL  XL) 25 MG 24 hr tablet    Sig: Take 1 tablet (25 mg total) by mouth daily.    Dispense:  90 tablet    Refill:  3    Patient Instructions  Medication Instructions:  Your physician has recommended you make the following change in your medication: Decrease metoprolol  from 50mg  to 25 mg daily. New prescription sent to [facility].   *If you need a refill on your cardiac medications before your next appointment, please call your pharmacy*  Lab Work: None needed If you have labs (blood work) drawn today and your tests are completely normal, you will receive your results only by: MyChart Message (if you have MyChart) OR A paper copy in the mail If you have any lab test that is abnormal or we need to change your treatment, we will call you to review the results.  Testing/Procedures: 02/2025 Your physician has requested that you have an echocardiogram. Echocardiography is a painless test that uses sound waves to create images of your heart. It provides your doctor with information about the size and shape of your heart and how well your heart's chambers and valves are working. This procedure takes approximately one hour. There are  no restrictions for this procedure. Please do NOT wear cologne, perfume, aftershave, or lotions (deodorant is allowed). Please arrive 15 minutes prior to your appointment time.  Please note: We ask at that you not bring children with you during ultrasound (echo/ vascular) testing. Due to room size and safety concerns, children are not allowed in the ultrasound rooms during exams. Our front office staff cannot provide observation of children in our lobby area while testing is being conducted. An adult accompanying a patient to their appointment will only be allowed in the ultrasound room at the discretion of the ultrasound technician under special circumstances. We apologize for any inconvenience.   Follow-Up: At [facility], you and your health needs are our priority.  As part of our continuing mission to provide you with exceptional heart care, our providers are all part of one team.  This team includes your primary Cardiologist (physician) and Advanced Practice Providers or APPs (Physician Assistants and Nurse Practitioners) who all work together to provide you with the care you need, when you need it.  Your next appointment:   As scheduled  Provider:   Izetta Hummer, PA-C    We recommend signing up for the patient portal called MyChart.  Sign up information is provided on this After Visit Summary.  MyChart is used to connect with patients for Virtual Visits (Telemedicine).  Patients are able to view lab/test results, encounter notes, upcoming appointments, etc.  Non-urgent messages can be sent to your provider as well.   To learn more about what you can do with MyChart, go to [email or web address].    Signed, Lamarr Hummer, PA-C  03/30/2024 2:48 PM    [facility]

## 2024-03-30 ENCOUNTER — Ambulatory Visit (INDEPENDENT_AMBULATORY_CARE_PROVIDER_SITE_OTHER): Admitting: Physician Assistant

## 2024-03-30 ENCOUNTER — Ambulatory Visit (HOSPITAL_COMMUNITY)
Admission: RE | Admit: 2024-03-30 | Discharge: 2024-03-30 | Disposition: A | Discharge status: Home / Self Care | Source: Ambulatory Visit | Attending: Cardiovascular Disease | Admitting: Cardiovascular Disease

## 2024-03-30 ENCOUNTER — Ambulatory Visit: Payer: Self-pay | Admitting: Physician Assistant

## 2024-03-30 VITALS — BP 102/52 | HR 65 | Ht 75.0 in | Wt 235.4 lb

## 2024-03-30 DIAGNOSIS — I5032 Chronic diastolic (congestive) heart failure: Secondary | ICD-10-CM | POA: Insufficient documentation

## 2024-03-30 DIAGNOSIS — N1832 Chronic kidney disease, stage 3b: Secondary | ICD-10-CM | POA: Diagnosis not present

## 2024-03-30 DIAGNOSIS — I10 Essential (primary) hypertension: Secondary | ICD-10-CM

## 2024-03-30 DIAGNOSIS — Z950 Presence of cardiac pacemaker: Secondary | ICD-10-CM | POA: Diagnosis present

## 2024-03-30 DIAGNOSIS — I2510 Atherosclerotic heart disease of native coronary artery without angina pectoris: Secondary | ICD-10-CM

## 2024-03-30 DIAGNOSIS — Z952 Presence of prosthetic heart valve: Secondary | ICD-10-CM | POA: Diagnosis not present

## 2024-03-30 DIAGNOSIS — I480 Paroxysmal atrial fibrillation: Secondary | ICD-10-CM | POA: Insufficient documentation

## 2024-03-30 LAB — ECHOCARDIOGRAM COMPLETE
AR max vel: 3.22 cm2
AV Area VTI: 3.43 cm2
AV Area mean vel: 3.15 cm2
AV Mean grad: 9 mmHg
AV Peak grad: 16.5 mmHg
Ao pk vel: 2.03 m/s
Area-P 1/2: 3.5 cm2
S' Lateral: 3.4 cm

## 2024-03-30 MED ORDER — METOPROLOL SUCCINATE ER 25 MG PO TB24: 25.0000 mg | ORAL_TABLET | Freq: Every day | ORAL | 3 refills | Status: DC

## 2024-03-30 MED ORDER — METOPROLOL TARTRATE 25 MG PO TABS: 25.0000 mg | ORAL_TABLET | Freq: Every day | ORAL | 3 refills | Status: DC

## 2024-03-30 NOTE — Patient Instructions (Signed)
 Medication Instructions:  Your physician has recommended you make the following change in your medication: Decrease metoprolol  from 50mg  to 25 mg daily. New prescription sent to [facility].   *If you need a refill on your cardiac medications before your next appointment, please call your pharmacy*  Lab Work: None needed If you have labs (blood work) drawn today and your tests are completely normal, you will receive your results only by: MyChart Message (if you have MyChart) OR A paper copy in the mail If you have any lab test that is abnormal or we need to change your treatment, we will call you to review the results.  Testing/Procedures: 02/2025 Your physician has requested that you have an echocardiogram. Echocardiography is a painless test that uses sound waves to create images of your heart. It provides your doctor with information about the size and shape of your heart and how well your heart's chambers and valves are working. This procedure takes approximately one hour. There are no restrictions for this procedure. Please do NOT wear cologne, perfume, aftershave, or lotions (deodorant is allowed). Please arrive 15 minutes prior to your appointment time.  Please note: We ask at that you not bring children with you during ultrasound (echo/ vascular) testing. Due to room size and safety concerns, children are not allowed in the ultrasound rooms during exams. Our front office staff cannot provide observation of children in our lobby area while testing is being conducted. An adult accompanying a patient to their appointment will only be allowed in the ultrasound room at the discretion of the ultrasound technician under special circumstances. We apologize for any inconvenience.   Follow-Up: At [facility], you and your health needs are our priority.  As part of our continuing mission to provide you with exceptional heart care, our providers are all part of one team.  This team  includes your primary Cardiologist (physician) and Advanced Practice Providers or APPs (Physician Assistants and Nurse Practitioners) who all work together to provide you with the care you need, when you need it.  Your next appointment:   As scheduled  Provider:   Izetta Hummer, PA-C    We recommend signing up for the patient portal called MyChart.  Sign up information is provided on this After Visit Summary.  MyChart is used to connect with patients for Virtual Visits (Telemedicine).  Patients are able to view lab/test results, encounter notes, upcoming appointments, etc.  Non-urgent messages can be sent to your provider as well.   To learn more about what you can do with MyChart, go to [email or web address].

## 2024-04-01 ENCOUNTER — Ambulatory Visit (INDEPENDENT_AMBULATORY_CARE_PROVIDER_SITE_OTHER)

## 2024-04-01 VITALS — Ht 75.0 in | Wt 228.5 lb

## 2024-04-01 DIAGNOSIS — Z0000 Encounter for general adult medical examination without abnormal findings: Secondary | ICD-10-CM | POA: Diagnosis not present

## 2024-04-01 MED ORDER — METOPROLOL SUCCINATE ER 25 MG PO TB24: 12.5000 mg | ORAL_TABLET | Freq: Every day | ORAL | 3 refills | Status: DC

## 2024-04-01 NOTE — Progress Notes (Signed)
 Subjective:   [name] is a 89 y.o. who presents for a Medicare Wellness preventive visit.  As a reminder, Annual Wellness Visits don't include a physical exam, and some assessments may be limited, especially if this visit is performed virtually. We may recommend an in-person follow-up visit with your provider if needed.  Visit Complete: Virtual I connected with  [name] on 04/01/24 by a audio enabled telemedicine application and verified that I am speaking with the correct person using two identifiers.  Patient Location: Home  Provider Location: Home Office  I discussed the limitations of evaluation and management by telemedicine. The patient expressed understanding and agreed to proceed.  Vital Signs: Because this visit was a virtual/telehealth visit, some criteria may be missing or patient reported. Any vitals not documented were not able to be obtained and vitals that have been documented are patient reported.  VideoDeclined- This patient declined Librarian, academic. Therefore the visit was completed with audio only.  Persons Participating in Visit: Patient.  AWV Questionnaire: No: Patient Medicare AWV questionnaire was not completed prior to this visit.  Cardiac Risk Factors include: advanced age (>7men, >58 women);hypertension;dyslipidemia;male gender     Objective:    Today's Vitals   04/01/24 0801  Weight: 228 lb 8 oz (103.6 kg)  Height: 6' 3 (1.905 m)   Body mass index is 28.56 kg/m.     04/01/2024    8:06 AM 02/11/2024   11:46 AM 12/20/2023   10:53 AM 03/25/2023    8:37 AM 03/19/2022    8:47 AM 03/06/2021    8:54 AM 11/19/2019   10:20 AM  Advanced Directives  Does Patient Have a Medical Advance Directive? Yes Yes Yes Yes Yes Yes Yes  Type of Estate agent of [place];Living will Healthcare Power of [place];Living will Healthcare Power of [place];Living will Healthcare Power of [place];Living will  Healthcare Power of [place];Living will Healthcare Power of [place];Living will Living will;Healthcare Power of Attorney  Does patient want to make changes to medical advance directive?  No - Patient declined No - Guardian declined    No - Patient declined  Copy of Healthcare Power of Attorney in Chart? No - copy requested No - copy requested  No - copy requested No - copy requested No - copy requested No - copy requested    Current Medications (verified) Outpatient Encounter Medications as of 04/01/2024  Medication Sig   allopurinol  (ZYLOPRIM ) 300 MG tablet TAKE 1 TABLET DAILY   atorvastatin  (LIPITOR) 40 MG tablet Take 1 tablet (40 mg total) by mouth at bedtime.   diphenhydramine -acetaminophen  (TYLENOL  PM) 25-500 MG TABS tablet Take 1 tablet by mouth at bedtime as needed (sleep).   ELIQUIS  2.5 MG TABS tablet TAKE 1 TABLET TWICE A DAY   finasteride  (PROSCAR ) 5 MG tablet Take 5 mg by mouth daily.   metoprolol  succinate (TOPROL  XL) 25 MG 24 hr tablet Take 0.5 tablets (12.5 mg total) by mouth daily.   nitroGLYCERIN  (NITROSTAT ) 0.4 MG SL tablet Place 1 tablet (0.4 mg total) under the tongue every 5 (five) minutes as needed for chest pain.   Tamsulosin  HCl (FLOMAX ) 0.4 MG CAPS Take 0.8 mg by mouth at bedtime.   [DISCONTINUED] metoprolol  succinate (TOPROL  XL) 25 MG 24 hr tablet Take 1 tablet (25 mg total) by mouth daily.   No facility-administered encounter medications on file as of 04/01/2024.    Allergies (verified) Epinephrine , Penicillins, Lisinopril, and Novocain [procaine]   History: Past Medical History:  Diagnosis Date   Arthritis    back, knees (11/21/2016)   Bladder stones    CAD (coronary artery disease)    11/20/16 PCI with DES--> LAD/RCA   Chronic lower back pain    CKD (chronic kidney disease), stage IV (HCC)    thelbert 11/08/2016   Dilatation of aorta (HCC)    Elevated hemidiaphragm    History of gout    History of kidney stones 12/02/2008   History of pulmonary embolism     Hyperlipidemia    Hypertension    NSVT (nonsustained ventricular tachycardia) (HCC)    Pacemaker    PAF (paroxysmal atrial fibrillation) (HCC)    Pulmonary hypertension (HCC)    Recurrent deep venous thrombosis (HCC)    S/P TAVR (transcatheter aortic valve replacement) 02/11/2024   s/p TAVR with a 29 mm Edwards S3UR via the TF approach by Dr. Wonda and Dr. Lucas   Past Surgical History:  Procedure Laterality Date   CARDIAC CATHETERIZATION  11/14/2016   CATARACT EXTRACTION W/ INTRAOCULAR LENS  IMPLANT, BILATERAL     CORONARY ANGIOPLASTY WITH STENT PLACEMENT  11/21/2016   2 stents    CORONARY ATHERECTOMY N/A 11/21/2016   Procedure: Coronary Atherectomy;  Surgeon: Richard LELON Clay, MD;  Location: [facility] INVASIVE CV LAB;  Service: Cardiovascular;  Laterality: N/A;  RCA and LAD   CORONARY PRESSURE/FFR STUDY N/A 05/22/2019   Procedure: INTRAVASCULAR PRESSURE WIRE/FFR STUDY;  Surgeon: Mady Bruckner, MD;  Location: [facility] INVASIVE CV LAB;  Service: Cardiovascular;  Laterality: N/A;   CORONARY STENT INTERVENTION N/A 11/21/2016   Procedure: Coronary Stent Intervention;  Surgeon: Richard LELON Clay, MD;  Location: [facility] INVASIVE CV LAB;  Service: Cardiovascular;  Laterality: N/A;  RCA and LAD   CYSTOSCOPY W/ STONE MANIPULATION     INTRAOPERATIVE TRANSTHORACIC ECHOCARDIOGRAM N/A 02/11/2024   Procedure: ECHOCARDIOGRAM, TRANSTHORACIC;  Surgeon: Wonda Sharper, MD;  Location: [facility] INVASIVE CV LAB;  Service: Cardiovascular;  Laterality: N/A;   JOINT REPLACEMENT     KNEE ARTHROSCOPY Left    LAPAROSCOPIC CHOLECYSTECTOMY     LEFT HEART CATH N/A 11/21/2016   Procedure: Left Heart Cath;  Surgeon: Richard LELON Clay, MD;  Location: [facility] INVASIVE CV LAB;  Service: Cardiovascular;  Laterality: N/A;   LITHOTRIPSY     PILONIDAL CYST EXCISION  1956   RIGHT/LEFT HEART CATH AND CORONARY ANGIOGRAPHY N/A 11/14/2016   Procedure: Right/Left Heart Cath and Coronary Angiography;  Surgeon: Richard LELON Clay, MD;  Location: [facility]  INVASIVE CV LAB;  Service: Cardiovascular;  Laterality: N/A;   RIGHT/LEFT HEART CATH AND CORONARY ANGIOGRAPHY N/A 05/22/2019   Procedure: RIGHT/LEFT HEART CATH AND CORONARY ANGIOGRAPHY;  Surgeon: Mady Bruckner, MD;  Location: [facility] INVASIVE CV LAB;  Service: Cardiovascular;  Laterality: N/A;   RIGHT/LEFT HEART CATH AND CORONARY ANGIOGRAPHY N/A 12/20/2023   Procedure: RIGHT/LEFT HEART CATH AND CORONARY ANGIOGRAPHY;  Surgeon: Wendel Lurena POUR, MD;  Location: [facility] INVASIVE CV LAB;  Service: Cardiovascular;  Laterality: N/A;   TEMPORARY PACEMAKER N/A 11/21/2016   Procedure: Temporary Pacemaker;  Surgeon: Richard LELON Clay, MD;  Location: [facility] INVASIVE CV LAB;  Service: Cardiovascular;  Laterality: N/A;   TONSILLECTOMY     TOTAL KNEE ARTHROPLASTY Left 09/22/2012   Procedure: TOTAL KNEE ARTHROPLASTY;  Surgeon: Lamar DELENA Millman, MD;  Location: [facility] OR;  Service: Orthopedics;  Laterality: Left;   Family History  Problem Relation Age of Onset   Breast cancer Mother    Heart attack Father        55, smoker   Social  History   Socioeconomic History   Marital status: Married    Spouse name: Not on file   Number of children: Not on file   Years of education: Not on file   Highest education level: Not on file  Occupational History   Not on file  Tobacco Use   Smoking status: Never   Smokeless tobacco: Never  Vaping Use   Vaping status: Never Used  Substance and Sexual Activity   Alcohol use: Not Currently    Comment: 11/21/2016 nothing since 2015   Drug use: No   Sexual activity: Yes    Partners: Female  Other Topics Concern   Not on file  Social History Narrative   Married (Wife June patient of Dr. Katrinka). 2 sons. 4 grandkids.    Originally [place]    Lives at [employer] 6-7 months of the year      Retired from [employer] long time. 27 years of service.       Hobbies: golf   Used to [employer], run [employer]   Social Drivers of [employer]: Low Risk  (04/01/2024)   Overall Financial Resource Strain (CARDIA)    Difficulty of Paying Living Expenses: Not hard at all  Food Insecurity: No Food Insecurity (04/01/2024)   Hunger Vital Sign    Worried About Running Out of Food in the Last Year: Never true    Ran Out of Food in the Last Year: Never true  Transportation Needs: No Transportation Needs (04/01/2024)   PRAPARE - Administrator, Civil Service (Medical): No    Lack of Transportation (Non-Medical): No  Physical Activity: Inactive (04/01/2024)   Exercise Vital Sign    Days of Exercise per Week: 0 days    Minutes of Exercise per Session: 0 min  Stress: No Stress Concern Present (04/01/2024)   [employer] of Occupational Health - Occupational Stress Questionnaire    Feeling of Stress: Not at all  Social Connections: Moderately Integrated (04/01/2024)   Social Connection and Isolation Panel    Frequency of Communication with Friends and Family: More than three times a week    Frequency of Social Gatherings with Friends and Family: More than three times a week    Attends Religious Services: Never    Database administrator or Organizations: Yes    Attends Engineer, structural: 1 to 4 times per year    Marital Status: Married    Tobacco Counseling Counseling given: Not Answered    Clinical Intake:  Pre-visit preparation completed: Yes  Pain : No/denies pain     BMI - recorded: 28.56 Nutritional Status: BMI 25 -29 Overweight Nutritional Risks: None Diabetes: No  No results found for: HGBA1C   How often do you need to have someone help you when you read instructions, pamphlets, or other written materials from your doctor or pharmacy?: 1 - Never  Interpreter Needed?: No  Information entered by :: Ellouise Haws, LPN   Activities of Daily Living     04/01/2024    8:03 AM 02/12/2024    1:08 AM  In your present state of health, do you have any difficulty performing the following  activities:  Hearing? 0   Vision? 0   Difficulty concentrating or making decisions? 0   Walking or climbing stairs? 1   Comment sob   Dressing or bathing? 0   Doing errands, shopping? 0 0  Preparing Food and eating ? N  Using the Toilet? N   In the past six months, have you accidently leaked urine? N   Do you have problems with loss of bowel control? N   Managing your Medications? N   Managing your Finances? N   Housekeeping or managing your Housekeeping? N     Patient Care Team: Katrinka Garnette KIDD, MD as PCP - General (Family Medicine) Inocencio Soyla Lunger, MD as PCP - Electrophysiology (Cardiology) Jeffrie Oneil BROCKS, MD as PCP - Cardiology (Cardiology) Wonda Sharper, MD as PCP - Structural Heart (Cardiology) Deterding, [place], MD as Consulting Physician (Nephrology) [facility], [facility] as Consulting Physician (Ophthalmology) Sherrilee Belvie CROME, MD as Consulting Physician (Urology)  I have updated your Care Teams any recent Medical Services you may have received from other providers in the past year.     Assessment:   This is a routine wellness examination for [name].  Hearing/Vision screen Hearing Screening - Comments:: Pt denies any hearing issues  Vision Screening - Comments:: Wears rx glasses - up to date with routine eye exams with Dr Shiela [place]   Goals Addressed             This Visit's Progress    Patient Stated       Start exercising more        Depression Screen     04/01/2024    8:07 AM 03/25/2023    8:37 AM 03/19/2022    8:46 AM 03/06/2021    8:52 AM 11/30/2020   11:14 AM 11/19/2019   10:21 AM 09/14/2019    9:46 AM  PHQ 2/9 Scores  PHQ - 2 Score 0 0 0 0 0 0 0    Fall Risk     04/01/2024    8:08 AM 03/25/2023    8:38 AM 03/19/2022    8:48 AM 03/06/2021    8:55 AM 11/30/2020   11:14 AM  Fall Risk   Falls in the past year? 0 0 0 0 0  Number falls in past yr: 0 0 0 0 0  Injury with Fall? 0 0 0 0 0  Risk for fall due to : Impaired mobility  Impaired vision  Impaired vision   Follow up Falls prevention discussed Falls prevention discussed Falls prevention discussed  Falls prevention discussed       Data saved with a previous flowsheet row definition    MEDICARE RISK AT HOME:  Medicare Risk at Home Any stairs in or around the home?: No If so, are there any without handrails?: No Home free of loose throw rugs in walkways, pet beds, electrical cords, etc?: Yes Adequate lighting in your home to reduce risk of falls?: Yes Life alert?: No Use of a cane, walker or w/c?: No Grab bars in the bathroom?: Yes Shower chair or bench in shower?: No Elevated toilet seat or a handicapped toilet?: No  TIMED UP AND GO:  Was the test performed?  No  Cognitive Function: 6CIT completed        04/01/2024    8:09 AM 03/25/2023    8:39 AM 03/19/2022    8:49 AM 03/06/2021    8:57 AM 11/19/2019   10:21 AM  6CIT Screen  What Year? 0 points 0 points 0 points 0 points 0 points  What month? 0 points 0 points 0 points 0 points 0 points  What time? 0 points 0 points 0 points 0 points 0 points  Count back from 20 0 points 0 points 0 points 0  points 0 points  Months in reverse 0 points 0 points 0 points 0 points 0 points  Repeat phrase 0 points 0 points 0 points 0 points 0 points  Total Score 0 points 0 points 0 points 0 points 0 points    Immunizations Immunization History  Administered Date(s) Administered   Fluad Quad(high Dose 65+) 04/25/2021   Influenza Whole 04/25/2009, 06/21/2010, 07/01/2011   Influenza, High Dose Seasonal PF 04/22/2014, 06/10/2017, 05/06/2018, 05/12/2020, 06/13/2022, 07/01/2023   Influenza,inj,Quad PF,6+ Mos 06/17/2013   Influenza-Unspecified 06/20/2000, 05/13/2002, 06/08/2003, 06/06/2004, 07/13/2006, 04/25/2011, 06/17/2013, 04/22/2014, 05/22/2016, 06/10/2017, 05/01/2019   Moderna Sars-Covid-2 Vaccination 08/19/2019, 09/16/2019, 04/08/2020   Pneumococcal Conjugate-13 12/28/2013   Pneumococcal Polysaccharide-23  10/22/2007   Pneumococcal-Unspecified 04/07/2001   Td 12/05/2009   Zoster Recombinant(Shingrix) 06/13/2022    Screening Tests Health Maintenance  Topic Date Due   DTaP/Tdap/Td (2 - Tdap) 12/06/2019   Zoster Vaccines- Shingrix (2 of 2) 08/08/2022   COVID-19 Vaccine (4 - 2024-25 season) 04/14/2023   INFLUENZA VACCINE  03/13/2024   Medicare Annual Wellness (AWV)  04/01/2025   Pneumococcal Vaccine: 50+ Years  Completed   HPV VACCINES  Aged Out   Meningococcal B Vaccine  Aged Out   Pneumococcal Vaccine  Discontinued    Health Maintenance  Health Maintenance Due  Topic Date Due   DTaP/Tdap/Td (2 - Tdap) 12/06/2019   Zoster Vaccines- Shingrix (2 of 2) 08/08/2022   COVID-19 Vaccine (4 - 2024-25 season) 04/14/2023   INFLUENZA VACCINE  03/13/2024   Health Maintenance Items Addressed: See Nurse Notes at the end of this note  Additional Screening:  Vision Screening: Recommended annual ophthalmology exams for early detection of glaucoma and other disorders of the eye. Would you like a referral to an eye doctor? No    Dental Screening: Recommended annual dental exams for proper oral hygiene  Community Resource Referral / Chronic Care Management: CRR required this visit?  No   CCM required this visit?  No   Plan:    I have personally reviewed and noted the following in the patient's chart:   Medical and social history Use of alcohol, tobacco or illicit drugs  Current medications and supplements including opioid prescriptions. Patient is not currently taking opioid prescriptions. Functional ability and status Nutritional status Physical activity Advanced directives List of other physicians Hospitalizations, surgeries, and ER visits in previous 12 months Vitals Screenings to include cognitive, depression, and falls Referrals and appointments  In addition, I have reviewed and discussed with patient certain preventive protocols, quality metrics, and best practice  recommendations. A written personalized care plan for preventive services as well as general preventive health recommendations were provided to patient.   Ellouise VEAR Haws, LPN   1/79/7974   After Visit Summary: (MyChart) Due to this being a telephonic visit, the after visit summary with patients personalized plan was offered to patient via MyChart   Notes: Nothing significant to report at this time. Pt aware of immunizations due

## 2024-04-01 NOTE — Patient Instructions (Signed)
 [name] , Thank you for taking time out of your busy schedule to complete your Annual Wellness Visit with me. I enjoyed our conversation and look forward to speaking with you again next year. I, as well as your care team,  appreciate your ongoing commitment to your health goals. Please review the following plan we discussed and let me know if I can assist you in the future. Your Game plan/ To Do List    Referrals: If you haven't heard from the office you've been referred to, please reach out to them at the phone provided.   Follow up Visits: We will see or speak with you next year for your Next Medicare AWV with our clinical staff Have you seen your provider in the last 6 months (3 months if uncontrolled diabetes)? No  Clinician Recommendations:  Each day, aim for 6 glasses of water, plenty of protein in your diet and try to get up and walk/ stretch every hour for 5-10 minutes at a time.        This is a list of the screenings recommended for you:  Health Maintenance  Topic Date Due   DTaP/Tdap/Td vaccine (2 - Tdap) 12/06/2019   Zoster (Shingles) Vaccine (2 of 2) 08/08/2022   COVID-19 Vaccine (4 - 2024-25 season) 04/14/2023   Medicare Annual Wellness Visit  03/24/2024   Flu Shot  03/13/2024   Pneumococcal Vaccine for age over 54  Completed   HPV Vaccine  Aged Out   Meningitis B Vaccine  Aged Out   Pneumococcal Vaccine  Discontinued    Advanced directives: (Copy Requested) Please bring a copy of your health care power of attorney and living will to the office to be added to your chart at your convenience. You can mail to [facility] [address] [place], [place] [address] or email to [email or web address] Advance Care Planning is important because it:  [x]  Makes sure you receive the medical care that is consistent with your values, goals, and preferences  [x]  It provides guidance to your family and loved ones and reduces their decisional burden about  whether or not they are making the right decisions based on your wishes.  Follow the link provided in your after visit summary or read over the paperwork we have mailed to you to help you started getting your Advance Directives in place. If you need assistance in completing these, please reach out to us  so that we can help you!  See attachments for Preventive Care and Fall Prevention Tips.

## 2024-04-08 ENCOUNTER — Encounter

## 2024-04-09 ENCOUNTER — Telehealth: Payer: Self-pay | Admitting: Cardiology

## 2024-04-09 MED ORDER — METOPROLOL SUCCINATE ER 25 MG PO TB24
25.0000 mg | ORAL_TABLET | Freq: Every day | ORAL | Status: AC
Start: 2024-04-09 — End: ?

## 2024-04-09 NOTE — Telephone Encounter (Signed)
 Spoke with pt, and [facility], the patient is currently taking metoprolol  xl 25 mg once daily.

## 2024-04-09 NOTE — Telephone Encounter (Signed)
 Pt c/o medication issue:  1. Name of Medication:  Metoprolol   2. How are you currently taking this medication (dosage and times per day)?   3. Are you having a reaction (difficulty breathing--STAT)?   4. What is your medication issue?   Per Lauraine with [facility], they have multiple prescriptions for Metoprolol  that aren't the same. She would like to clarify correct dose/instructions. Please advise.

## 2024-04-10 ENCOUNTER — Other Ambulatory Visit: Payer: Self-pay | Admitting: Physician Assistant

## 2024-04-10 MED ORDER — FUROSEMIDE 20 MG PO TABS: 20.0000 mg | ORAL_TABLET | Freq: Every day | ORAL | 3 refills | Status: AC

## 2024-04-14 ENCOUNTER — Ambulatory Visit (INDEPENDENT_AMBULATORY_CARE_PROVIDER_SITE_OTHER): Payer: Medicare Other

## 2024-04-14 DIAGNOSIS — I480 Paroxysmal atrial fibrillation: Secondary | ICD-10-CM | POA: Diagnosis not present

## 2024-04-16 ENCOUNTER — Ambulatory Visit: Payer: Self-pay | Admitting: Cardiology

## 2024-04-16 LAB — CUP PACEART REMOTE DEVICE CHECK
Battery Remaining Longevity: 53 mo
Battery Voltage: 2.88 V
Brady Statistic AP VP Percent: 72.9 %
Brady Statistic AP VS Percent: 24.06 %
Brady Statistic AS VP Percent: 0.81 %
Brady Statistic AS VS Percent: 2.23 %
Brady Statistic RA Percent Paced: 98.78 %
Brady Statistic RV Percent Paced: 73.71 %
Date Time Interrogation Session: 20250901224007
Implantable Lead Connection Status: 753985
Implantable Lead Connection Status: 753985
Implantable Lead Implant Date: 20190116
Implantable Lead Implant Date: 20190116
Implantable Lead Location: 753859
Implantable Lead Location: 753860
Implantable Lead Model: 5076
Implantable Lead Model: 5076
Implantable Pulse Generator Implant Date: 20190116
Lead Channel Impedance Value: 285 Ohm
Lead Channel Impedance Value: 304 Ohm
Lead Channel Impedance Value: 342 Ohm
Lead Channel Impedance Value: 380 Ohm
Lead Channel Pacing Threshold Amplitude: 0.875 V
Lead Channel Pacing Threshold Amplitude: 0.875 V
Lead Channel Pacing Threshold Pulse Width: 0.4 ms
Lead Channel Pacing Threshold Pulse Width: 0.4 ms
Lead Channel Sensing Intrinsic Amplitude: 0.125 mV
Lead Channel Sensing Intrinsic Amplitude: 0.125 mV
Lead Channel Sensing Intrinsic Amplitude: 5.25 mV
Lead Channel Sensing Intrinsic Amplitude: 5.25 mV
Lead Channel Setting Pacing Amplitude: 2 V
Lead Channel Setting Pacing Amplitude: 2 V
Lead Channel Setting Pacing Pulse Width: 0.4 ms
Lead Channel Setting Sensing Sensitivity: 1.2 mV
Zone Setting Status: 755011
Zone Setting Status: 755011

## 2024-04-21 NOTE — Progress Notes (Signed)
 Remote PPM Transmission

## 2024-05-10 ENCOUNTER — Encounter: Payer: Self-pay | Admitting: Cardiology

## 2024-05-29 ENCOUNTER — Encounter: Payer: Self-pay | Admitting: Family Medicine

## 2024-06-03 ENCOUNTER — Encounter: Payer: Self-pay | Admitting: Family Medicine

## 2024-07-14 ENCOUNTER — Ambulatory Visit

## 2024-07-14 DIAGNOSIS — I480 Paroxysmal atrial fibrillation: Secondary | ICD-10-CM | POA: Diagnosis not present

## 2024-07-15 LAB — CUP PACEART REMOTE DEVICE CHECK
Battery Remaining Longevity: 57 mo
Battery Voltage: 2.88 V
Brady Statistic AP VP Percent: 53.15 %
Brady Statistic AP VS Percent: 43.81 %
Brady Statistic AS VP Percent: 0.72 %
Brady Statistic AS VS Percent: 2.32 %
Brady Statistic RA Percent Paced: 98.11 %
Brady Statistic RV Percent Paced: 53.87 %
Date Time Interrogation Session: 20251202050312
Implantable Lead Connection Status: 753985
Implantable Lead Connection Status: 753985
Implantable Lead Implant Date: 20190116
Implantable Lead Implant Date: 20190116
Implantable Lead Location: 753859
Implantable Lead Location: 753860
Implantable Lead Model: 5076
Implantable Lead Model: 5076
Implantable Pulse Generator Implant Date: 20190116
Lead Channel Impedance Value: 285 Ohm
Lead Channel Impedance Value: 304 Ohm
Lead Channel Impedance Value: 342 Ohm
Lead Channel Impedance Value: 342 Ohm
Lead Channel Pacing Threshold Amplitude: 0.875 V
Lead Channel Pacing Threshold Amplitude: 0.875 V
Lead Channel Pacing Threshold Pulse Width: 0.4 ms
Lead Channel Pacing Threshold Pulse Width: 0.4 ms
Lead Channel Sensing Intrinsic Amplitude: 0.125 mV
Lead Channel Sensing Intrinsic Amplitude: 0.125 mV
Lead Channel Sensing Intrinsic Amplitude: 5.875 mV
Lead Channel Sensing Intrinsic Amplitude: 5.875 mV
Lead Channel Setting Pacing Amplitude: 2 V
Lead Channel Setting Pacing Amplitude: 2 V
Lead Channel Setting Pacing Pulse Width: 0.4 ms
Lead Channel Setting Sensing Sensitivity: 1.2 mV
Zone Setting Status: 755011
Zone Setting Status: 755011

## 2024-07-16 ENCOUNTER — Ambulatory Visit: Payer: Self-pay | Admitting: Cardiology

## 2024-07-17 NOTE — Progress Notes (Signed)
 Remote PPM Transmission

## 2024-07-20 ENCOUNTER — Encounter: Payer: Self-pay | Admitting: Cardiology

## 2024-07-22 ENCOUNTER — Ambulatory Visit: Admitting: Cardiology

## 2024-08-18 ENCOUNTER — Other Ambulatory Visit: Payer: Self-pay

## 2024-08-18 ENCOUNTER — Telehealth: Payer: Self-pay

## 2024-08-18 MED ORDER — ALLOPURINOL 300 MG PO TABS: 300.0000 mg | ORAL_TABLET | Freq: Every day | ORAL | 3 refills | Status: AC

## 2024-08-18 NOTE — Telephone Encounter (Signed)
 Copied from CRM [id number]. Topic: Clinical - Prescription Issue >> Aug 18, 2024 10:25 AM Rea BROCKS wrote: Reason for CRM: allopurinol  (ZYLOPRIM ) 300 MG tablet  [facility] - [place], [place] - [address] [place] [address] Phone: [phone] Fax: [phone] Hours: Not open 24 hours    Patient received call from pharmacy and they have not received authorization from Dr. Katrinka since December 6th for refill on this prescription.   [phone] Dr. Katrinka or representative can call this number to provide verbal authorization, provided from [facility].

## 2024-08-18 NOTE — Telephone Encounter (Signed)
 Spoke with stephanie with [facility]. New script sent since old one was expired.

## 2025-02-18 ENCOUNTER — Other Ambulatory Visit (HOSPITAL_COMMUNITY)

## 2025-02-18 ENCOUNTER — Ambulatory Visit: Admitting: Physician Assistant

## 2025-04-06 ENCOUNTER — Ambulatory Visit
